# Patient Record
Sex: Male | Born: 1945 | Race: White | Hispanic: No | Marital: Married | Smoking: Never smoker
Health system: Southern US, Community
[De-identification: ages and names within clinical notes are randomized; demographics above are authoritative.]

## PROBLEM LIST (undated history)

## (undated) DIAGNOSIS — D126 Benign neoplasm of colon, unspecified: Secondary | ICD-10-CM

## (undated) DIAGNOSIS — M1990 Unspecified osteoarthritis, unspecified site: Secondary | ICD-10-CM

## (undated) DIAGNOSIS — I351 Nonrheumatic aortic (valve) insufficiency: Secondary | ICD-10-CM

## (undated) DIAGNOSIS — Z792 Long term (current) use of antibiotics: Secondary | ICD-10-CM

## (undated) DIAGNOSIS — G4730 Sleep apnea, unspecified: Secondary | ICD-10-CM

## (undated) DIAGNOSIS — R911 Solitary pulmonary nodule: Secondary | ICD-10-CM

## (undated) DIAGNOSIS — J309 Allergic rhinitis, unspecified: Secondary | ICD-10-CM

## (undated) DIAGNOSIS — Z87442 Personal history of urinary calculi: Secondary | ICD-10-CM

## (undated) DIAGNOSIS — I7789 Other specified disorders of arteries and arterioles: Secondary | ICD-10-CM

## (undated) DIAGNOSIS — E785 Hyperlipidemia, unspecified: Secondary | ICD-10-CM

## (undated) DIAGNOSIS — K8020 Calculus of gallbladder without cholecystitis without obstruction: Secondary | ICD-10-CM

## (undated) DIAGNOSIS — I350 Nonrheumatic aortic (valve) stenosis: Secondary | ICD-10-CM

## (undated) DIAGNOSIS — E291 Testicular hypofunction: Secondary | ICD-10-CM

## (undated) DIAGNOSIS — J45909 Unspecified asthma, uncomplicated: Secondary | ICD-10-CM

## (undated) DIAGNOSIS — I10 Essential (primary) hypertension: Secondary | ICD-10-CM

## (undated) DIAGNOSIS — G4733 Obstructive sleep apnea (adult) (pediatric): Secondary | ICD-10-CM

## (undated) DIAGNOSIS — H409 Unspecified glaucoma: Secondary | ICD-10-CM

## (undated) DIAGNOSIS — I4891 Unspecified atrial fibrillation: Secondary | ICD-10-CM

## (undated) HISTORY — DX: Testicular hypofunction: E29.1

## (undated) HISTORY — PX: CARDIAC CATHETERIZATION: SHX172

## (undated) HISTORY — DX: Hyperlipidemia, unspecified: E78.5

## (undated) HISTORY — DX: Other specified disorders of arteries and arterioles: I77.89

## (undated) HISTORY — DX: Unspecified glaucoma: H40.9

## (undated) HISTORY — DX: Unspecified atrial fibrillation: I48.91

## (undated) HISTORY — DX: Obstructive sleep apnea (adult) (pediatric): G47.33

## (undated) HISTORY — DX: Benign neoplasm of colon, unspecified: D12.6

## (undated) HISTORY — PX: LITHOTRIPSY: SUR834

## (undated) HISTORY — DX: Personal history of urinary calculi: Z87.442

## (undated) HISTORY — DX: Long term (current) use of antibiotics: Z79.2

## (undated) HISTORY — DX: Nonrheumatic aortic (valve) insufficiency: I35.1

## (undated) HISTORY — PX: JOINT REPLACEMENT: SHX530

## (undated) HISTORY — PX: CORONARY ARTERY BYPASS GRAFT: SHX141

## (undated) HISTORY — PX: EYE SURGERY: SHX253

## (undated) HISTORY — DX: Allergic rhinitis, unspecified: J30.9

## (undated) HISTORY — DX: Calculus of gallbladder without cholecystitis without obstruction: K80.20

## (undated) HISTORY — PX: RHINOPLASTY: SUR1284

## (undated) HISTORY — DX: Nonrheumatic aortic (valve) stenosis: I35.0

## (undated) HISTORY — DX: Sleep apnea, unspecified: G47.30

## (undated) HISTORY — PX: CATARACT EXTRACTION: SUR2

## (undated) HISTORY — PX: CARDIAC VALVE REPLACEMENT: SHX585

## (undated) HISTORY — PX: HERNIA REPAIR: SHX51

## (undated) HISTORY — DX: Solitary pulmonary nodule: R91.1

---

## 2002-05-21 ENCOUNTER — Encounter: Payer: Self-pay | Admitting: Emergency Medicine

## 2002-05-21 ENCOUNTER — Emergency Department (HOSPITAL_COMMUNITY): Admission: EM | Admit: 2002-05-21 | Discharge: 2002-05-21 | Payer: Self-pay | Admitting: Emergency Medicine

## 2004-11-10 ENCOUNTER — Ambulatory Visit: Payer: Self-pay | Admitting: Pulmonary Disease

## 2004-12-03 ENCOUNTER — Ambulatory Visit: Payer: Self-pay | Admitting: Pulmonary Disease

## 2004-12-31 ENCOUNTER — Ambulatory Visit: Payer: Self-pay | Admitting: Pulmonary Disease

## 2005-06-01 ENCOUNTER — Ambulatory Visit: Payer: Self-pay | Admitting: Pulmonary Disease

## 2005-10-20 ENCOUNTER — Ambulatory Visit: Payer: Self-pay | Admitting: Pulmonary Disease

## 2005-11-23 ENCOUNTER — Ambulatory Visit: Payer: Self-pay | Admitting: Pulmonary Disease

## 2005-12-13 ENCOUNTER — Ambulatory Visit: Payer: Self-pay | Admitting: Pulmonary Disease

## 2006-04-15 ENCOUNTER — Ambulatory Visit: Payer: Self-pay | Admitting: Pulmonary Disease

## 2006-12-05 ENCOUNTER — Ambulatory Visit: Payer: Self-pay | Admitting: Pulmonary Disease

## 2006-12-05 LAB — CONVERTED CEMR LAB
ALT: 31 units/L (ref 0–40)
AST: 29 units/L (ref 0–37)
Albumin: 3.6 g/dL (ref 3.5–5.2)
Alkaline Phosphatase: 54 units/L (ref 39–117)
BUN: 16 mg/dL (ref 6–23)
Basophils Absolute: 0 10*3/uL (ref 0.0–0.1)
Basophils Relative: 0.5 % (ref 0.0–1.0)
Bilirubin Urine: NEGATIVE
Bilirubin, Direct: 0.1 mg/dL (ref 0.0–0.3)
CO2: 33 meq/L — ABNORMAL HIGH (ref 19–32)
Calcium: 9.2 mg/dL (ref 8.4–10.5)
Chloride: 106 meq/L (ref 96–112)
Cholesterol: 128 mg/dL (ref 0–200)
Creatinine, Ser: 1.2 mg/dL (ref 0.4–1.5)
Eosinophils Absolute: 0.2 10*3/uL (ref 0.0–0.6)
Eosinophils Relative: 3.5 % (ref 0.0–5.0)
GFR calc Af Amer: 79 mL/min
GFR calc non Af Amer: 65 mL/min
Glucose, Bld: 103 mg/dL — ABNORMAL HIGH (ref 70–99)
HCT: 46.1 % (ref 39.0–52.0)
HDL: 31.2 mg/dL — ABNORMAL LOW (ref >39.0)
Hemoglobin, Urine: NEGATIVE
Hemoglobin: 15.6 g/dL (ref 13.0–17.0)
Ketones, ur: NEGATIVE mg/dL
LDL Cholesterol: 67 mg/dL (ref 0–99)
Leukocytes, UA: NEGATIVE
Lymphocytes Relative: 27.1 % (ref 12.0–46.0)
MCHC: 33.9 g/dL (ref 30.0–36.0)
MCV: 95.9 fL (ref 78.0–100.0)
Monocytes Absolute: 0.6 10*3/uL (ref 0.2–0.7)
Monocytes Relative: 9.9 % (ref 3.0–11.0)
Neutro Abs: 3.6 10*3/uL (ref 1.4–7.7)
Neutrophils Relative %: 59 % (ref 43.0–77.0)
Nitrite: NEGATIVE
PSA: 0.85 ng/mL (ref 0.10–4.00)
Platelets: 177 10*3/uL (ref 150–400)
Potassium: 4.6 meq/L (ref 3.5–5.1)
RBC: 4.8 M/uL (ref 4.22–5.81)
RDW: 12.3 % (ref 11.5–14.6)
Sodium: 144 meq/L (ref 135–145)
Specific Gravity, Urine: 1.025 (ref 1.000–1.03)
TSH: 1.41 microintl units/mL (ref 0.35–5.50)
Total Bilirubin: 0.8 mg/dL (ref 0.3–1.2)
Total CHOL/HDL Ratio: 4.1
Total Protein, Urine: NEGATIVE mg/dL
Total Protein: 6.6 g/dL (ref 6.0–8.3)
Triglycerides: 147 mg/dL (ref 0–149)
Urine Glucose: NEGATIVE mg/dL
Urobilinogen, UA: 0.2 (ref 0.0–1.0)
VLDL: 29 mg/dL (ref 0–40)
WBC: 6.1 10*3/uL (ref 4.5–10.5)
pH: 6 (ref 5.0–8.0)

## 2006-12-21 ENCOUNTER — Ambulatory Visit: Payer: Self-pay

## 2007-01-04 ENCOUNTER — Ambulatory Visit: Payer: Self-pay | Admitting: Pulmonary Disease

## 2007-01-04 LAB — CONVERTED CEMR LAB
Fecal Occult Blood: NEGATIVE
OCCULT 1: NEGATIVE
OCCULT 2: NEGATIVE
OCCULT 3: NEGATIVE
OCCULT 4: NEGATIVE
OCCULT 5: NEGATIVE

## 2007-12-18 DIAGNOSIS — G4733 Obstructive sleep apnea (adult) (pediatric): Secondary | ICD-10-CM

## 2007-12-18 DIAGNOSIS — J309 Allergic rhinitis, unspecified: Secondary | ICD-10-CM | POA: Insufficient documentation

## 2007-12-18 DIAGNOSIS — J209 Acute bronchitis, unspecified: Secondary | ICD-10-CM | POA: Insufficient documentation

## 2007-12-18 DIAGNOSIS — E7800 Pure hypercholesterolemia, unspecified: Secondary | ICD-10-CM | POA: Insufficient documentation

## 2008-05-08 ENCOUNTER — Ambulatory Visit: Payer: Self-pay | Admitting: Internal Medicine

## 2008-08-21 ENCOUNTER — Ambulatory Visit: Payer: Self-pay | Admitting: Pulmonary Disease

## 2008-08-22 DIAGNOSIS — H409 Unspecified glaucoma: Secondary | ICD-10-CM

## 2008-08-22 LAB — CONVERTED CEMR LAB
ALT: 44 units/L (ref 0–53)
AST: 40 units/L — ABNORMAL HIGH (ref 0–37)
Albumin: 4 g/dL (ref 3.5–5.2)
Alkaline Phosphatase: 58 units/L (ref 39–117)
BUN: 16 mg/dL (ref 6–23)
Basophils Absolute: 0.4 10*3/uL — ABNORMAL HIGH (ref 0.0–0.1)
Basophils Relative: 5 % — ABNORMAL HIGH (ref 0.0–3.0)
Bilirubin, Direct: 0.2 mg/dL (ref 0.0–0.3)
CO2: 30 meq/L (ref 19–32)
Calcium: 9.5 mg/dL (ref 8.4–10.5)
Chloride: 106 meq/L (ref 96–112)
Cholesterol: 124 mg/dL (ref 0–200)
Creatinine, Ser: 1.2 mg/dL (ref 0.4–1.5)
Eosinophils Absolute: 0.3 10*3/uL (ref 0.0–0.7)
Eosinophils Relative: 3.6 % (ref 0.0–5.0)
GFR calc Af Amer: 79 mL/min
GFR calc non Af Amer: 65 mL/min
Glucose, Bld: 100 mg/dL — ABNORMAL HIGH (ref 70–99)
HCT: 47.1 % (ref 39.0–52.0)
HDL: 32.8 mg/dL — ABNORMAL LOW (ref >39.0)
Hemoglobin: 16.2 g/dL (ref 13.0–17.0)
LDL Cholesterol: 67 mg/dL (ref 0–99)
Lymphocytes Relative: 30.3 % (ref 12.0–46.0)
MCHC: 34.4 g/dL (ref 30.0–36.0)
MCV: 94.7 fL (ref 78.0–100.0)
Monocytes Absolute: 0.6 10*3/uL (ref 0.1–1.0)
Monocytes Relative: 7.7 % (ref 3.0–12.0)
Neutro Abs: 4 10*3/uL (ref 1.4–7.7)
Neutrophils Relative %: 53.4 % (ref 43.0–77.0)
PSA: 1.22 ng/mL (ref 0.10–4.00)
Platelets: 165 10*3/uL (ref 150–400)
Potassium: 4.5 meq/L (ref 3.5–5.1)
RBC: 4.97 M/uL (ref 4.22–5.81)
RDW: 12.6 % (ref 11.5–14.6)
Sodium: 142 meq/L (ref 135–145)
TSH: 1.52 microintl units/mL (ref 0.35–5.50)
Total Bilirubin: 0.9 mg/dL (ref 0.3–1.2)
Total CHOL/HDL Ratio: 3.8
Total Protein: 7.1 g/dL (ref 6.0–8.3)
Triglycerides: 122 mg/dL (ref 0–149)
VLDL: 24 mg/dL (ref 0–40)
WBC: 7.6 10*3/uL (ref 4.5–10.5)

## 2008-08-25 LAB — CONVERTED CEMR LAB: Vit D, 1,25-Dihydroxy: 30 (ref 30–89)

## 2008-11-13 ENCOUNTER — Telehealth (INDEPENDENT_AMBULATORY_CARE_PROVIDER_SITE_OTHER): Payer: Self-pay | Admitting: *Deleted

## 2008-11-19 ENCOUNTER — Encounter: Payer: Self-pay | Admitting: Pulmonary Disease

## 2008-11-19 ENCOUNTER — Ambulatory Visit (HOSPITAL_BASED_OUTPATIENT_CLINIC_OR_DEPARTMENT_OTHER): Admission: RE | Admit: 2008-11-19 | Discharge: 2008-11-19 | Payer: Self-pay | Admitting: Pulmonary Disease

## 2008-11-30 ENCOUNTER — Ambulatory Visit: Payer: Self-pay | Admitting: Pulmonary Disease

## 2008-12-04 ENCOUNTER — Ambulatory Visit: Payer: Self-pay | Admitting: Pulmonary Disease

## 2009-02-10 ENCOUNTER — Ambulatory Visit: Payer: Self-pay | Admitting: Pulmonary Disease

## 2009-03-24 ENCOUNTER — Telehealth: Payer: Self-pay | Admitting: Pulmonary Disease

## 2009-03-25 ENCOUNTER — Encounter: Payer: Self-pay | Admitting: Pulmonary Disease

## 2009-04-03 ENCOUNTER — Ambulatory Visit (HOSPITAL_COMMUNITY): Admission: RE | Admit: 2009-04-03 | Discharge: 2009-04-03 | Payer: Self-pay | Admitting: Urology

## 2009-04-15 ENCOUNTER — Encounter: Payer: Self-pay | Admitting: Pulmonary Disease

## 2009-05-08 ENCOUNTER — Telehealth: Payer: Self-pay | Admitting: Pulmonary Disease

## 2009-05-09 ENCOUNTER — Encounter: Payer: Self-pay | Admitting: Pulmonary Disease

## 2009-05-16 ENCOUNTER — Encounter: Payer: Self-pay | Admitting: Pulmonary Disease

## 2009-06-27 ENCOUNTER — Encounter: Payer: Self-pay | Admitting: Pulmonary Disease

## 2009-07-09 ENCOUNTER — Telehealth (INDEPENDENT_AMBULATORY_CARE_PROVIDER_SITE_OTHER): Payer: Self-pay | Admitting: *Deleted

## 2009-08-13 ENCOUNTER — Ambulatory Visit: Payer: Self-pay | Admitting: Pulmonary Disease

## 2009-08-22 ENCOUNTER — Encounter (INDEPENDENT_AMBULATORY_CARE_PROVIDER_SITE_OTHER): Payer: Self-pay | Admitting: *Deleted

## 2009-08-22 ENCOUNTER — Ambulatory Visit: Payer: Self-pay | Admitting: Pulmonary Disease

## 2009-08-22 DIAGNOSIS — H9319 Tinnitus, unspecified ear: Secondary | ICD-10-CM | POA: Insufficient documentation

## 2009-08-23 DIAGNOSIS — N200 Calculus of kidney: Secondary | ICD-10-CM | POA: Insufficient documentation

## 2009-08-23 LAB — CONVERTED CEMR LAB
ALT: 28 units/L (ref 0–53)
AST: 29 units/L (ref 0–37)
Albumin: 4.2 g/dL (ref 3.5–5.2)
Alkaline Phosphatase: 54 units/L (ref 39–117)
BUN: 16 mg/dL (ref 6–23)
Basophils Absolute: 0 10*3/uL (ref 0.0–0.1)
Basophils Relative: 0.6 % (ref 0.0–3.0)
Bilirubin, Direct: 0.1 mg/dL (ref 0.0–0.3)
CO2: 30 meq/L (ref 19–32)
Calcium: 9.4 mg/dL (ref 8.4–10.5)
Chloride: 106 meq/L (ref 96–112)
Cholesterol: 120 mg/dL (ref 0–200)
Creatinine, Ser: 1.2 mg/dL (ref 0.4–1.5)
Eosinophils Absolute: 0.2 10*3/uL (ref 0.0–0.7)
Eosinophils Relative: 3.3 % (ref 0.0–5.0)
GFR calc non Af Amer: 64.82 mL/min (ref >60)
Glucose, Bld: 101 mg/dL — ABNORMAL HIGH (ref 70–99)
HCT: 45.6 % (ref 39.0–52.0)
HDL: 30.6 mg/dL — ABNORMAL LOW (ref >39.00)
Hemoglobin: 15.6 g/dL (ref 13.0–17.0)
LDL Cholesterol: 59 mg/dL (ref 0–99)
Lymphocytes Relative: 32.7 % (ref 12.0–46.0)
Lymphs Abs: 1.8 10*3/uL (ref 0.7–4.0)
MCHC: 34.1 g/dL (ref 30.0–36.0)
MCV: 98.3 fL (ref 78.0–100.0)
Monocytes Absolute: 0.6 10*3/uL (ref 0.1–1.0)
Monocytes Relative: 10 % (ref 3.0–12.0)
Neutro Abs: 3 10*3/uL (ref 1.4–7.7)
Neutrophils Relative %: 53.4 % (ref 43.0–77.0)
PSA: 0.89 ng/mL (ref 0.10–4.00)
Platelets: 157 10*3/uL (ref 150.0–400.0)
Potassium: 4.3 meq/L (ref 3.5–5.1)
RBC: 4.64 M/uL (ref 4.22–5.81)
RDW: 12.5 % (ref 11.5–14.6)
Sodium: 143 meq/L (ref 135–145)
TSH: 1.31 microintl units/mL (ref 0.35–5.50)
Total Bilirubin: 0.8 mg/dL (ref 0.3–1.2)
Total CHOL/HDL Ratio: 4
Total Protein: 7.3 g/dL (ref 6.0–8.3)
Triglycerides: 150 mg/dL — ABNORMAL HIGH (ref 0.0–149.0)
VLDL: 30 mg/dL (ref 0.0–40.0)
WBC: 5.6 10*3/uL (ref 4.5–10.5)

## 2009-08-28 ENCOUNTER — Encounter (INDEPENDENT_AMBULATORY_CARE_PROVIDER_SITE_OTHER): Payer: Self-pay | Admitting: *Deleted

## 2009-08-28 ENCOUNTER — Encounter: Payer: Self-pay | Admitting: Pulmonary Disease

## 2009-09-01 ENCOUNTER — Ambulatory Visit: Payer: Self-pay | Admitting: Gastroenterology

## 2009-09-17 ENCOUNTER — Ambulatory Visit: Payer: Self-pay | Admitting: Gastroenterology

## 2009-09-23 ENCOUNTER — Encounter: Payer: Self-pay | Admitting: Gastroenterology

## 2009-12-31 ENCOUNTER — Telehealth (INDEPENDENT_AMBULATORY_CARE_PROVIDER_SITE_OTHER): Payer: Self-pay | Admitting: *Deleted

## 2010-02-19 ENCOUNTER — Encounter: Payer: Self-pay | Admitting: Adult Health

## 2010-02-19 ENCOUNTER — Ambulatory Visit: Payer: Self-pay | Admitting: Pulmonary Disease

## 2010-02-20 LAB — CONVERTED CEMR LAB
Basophils Absolute: 0 10*3/uL (ref 0.0–0.1)
Basophils Relative: 0 % (ref 0–1)
Calcium: 9.8 mg/dL (ref 8.4–10.5)
Lymphocytes Relative: 39 % (ref 12–46)
MCHC: 33.1 g/dL (ref 30.0–36.0)
Neutro Abs: 3.1 10*3/uL (ref 1.7–7.7)
Neutrophils Relative %: 46 % (ref 43–77)
RDW: 13.2 % (ref 11.5–15.5)
Sodium: 140 meq/L (ref 135–145)
TSH: 1.735 microintl units/mL (ref 0.350–4.500)

## 2010-03-24 ENCOUNTER — Ambulatory Visit: Payer: Self-pay | Admitting: Pulmonary Disease

## 2010-08-13 ENCOUNTER — Encounter: Payer: Self-pay | Admitting: Pulmonary Disease

## 2010-08-24 ENCOUNTER — Ambulatory Visit: Payer: Self-pay | Admitting: Pulmonary Disease

## 2010-09-07 ENCOUNTER — Ambulatory Visit: Payer: Self-pay | Admitting: Pulmonary Disease

## 2010-09-07 DIAGNOSIS — R5381 Other malaise: Secondary | ICD-10-CM

## 2010-09-07 DIAGNOSIS — R5383 Other fatigue: Secondary | ICD-10-CM

## 2010-09-08 DIAGNOSIS — E291 Testicular hypofunction: Secondary | ICD-10-CM

## 2010-09-10 LAB — CONVERTED CEMR LAB: Testosterone: 139.66 ng/dL — ABNORMAL LOW (ref 350.00–890.00)

## 2010-09-14 ENCOUNTER — Telehealth (INDEPENDENT_AMBULATORY_CARE_PROVIDER_SITE_OTHER): Payer: Self-pay | Admitting: *Deleted

## 2010-09-15 ENCOUNTER — Encounter: Payer: Self-pay | Admitting: Pulmonary Disease

## 2010-09-29 ENCOUNTER — Telehealth (INDEPENDENT_AMBULATORY_CARE_PROVIDER_SITE_OTHER): Payer: Self-pay | Admitting: *Deleted

## 2010-10-16 ENCOUNTER — Ambulatory Visit
Admission: RE | Admit: 2010-10-16 | Discharge: 2010-10-16 | Payer: Self-pay | Source: Home / Self Care | Attending: Pulmonary Disease | Admitting: Pulmonary Disease

## 2010-10-27 NOTE — Letter (Signed)
Summary: Denied [facility]  Denied [facility]   Imported By: Lester [place] 08/26/2010 07:52:58  _____________________________________________________________________  External Attachment:    Type:   Image     Comment:   External Document

## 2010-10-27 NOTE — Progress Notes (Signed)
Summary: Records request from [employer] for records received from [employer]. Request forwarded to [facility]. Dena Chavis  December 31, 2009 4:34 PM

## 2010-10-27 NOTE — Assessment & Plan Note (Signed)
Summary: Acute NP office - HTN   Copy to:  [name] Primary Provider/Referring Provider:  [name]  CC:  elevated BP onset yesterday 154/100 and 160/102 at exercise program with his employer - denies HA and changes in vision.  History of Present Illness: 65 y/o WF here for a follow up visit & CPX...    ~  Oct09:  he feels well and has had a good year without new complaints or concerns... he still participates in several softball leagues including one for 20 year-olds and one for 60 year-olds [employer])... in 2005 he tripped and was knocked out- eval [place] [facility] w/ neg CT Brain- post concussion headache & referred to Neuro...   ~  August 22, 2009:  he developed a kidney stone 6/10 & has been eval by [name]- s/p lithotripsy and fragment still present, last seen 10/10 on Rapaflo, may need ureteroscopy (but no pain or hematuria now)...  today c/o tinnitus & wife wants further eval so we will refer him to ENT per request... 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   Feb 19, 2010 --Presents for work in visit for elevated blood pressure. Elevated BP onset yesterday 154/100 and 160/102 at exercise program with his employer. Denies chest pain, dyspnea, orthopnea, hemoptysis, fever, n/v/d, edema, headache,recent travel or antibiotics, speech/visual changes.  Not taking any new meds or otc meds. Recently started new exercise regimen. In past has had well controlled b/p on average 110-120 systolic.   Medications Prior to Update: 1)  Lumigan 0.03 % Soln (Bimatoprost) .[name].. 1 Drop in Each Eye Daily 2)  Proair Hfa 108 (90 Base) Mcg/act Aers (Albuterol Sulfate) .... Inhale 1-2 Sprays Every 4-6 H As Needed For Wheezing... 3)  Singulair 10 Mg  Tabs (Montelukast Sodium) .... Take 1 Tablet By Mouth Once A Day As Directed... 4)  Adult Aspirin Low Strength 81 Mg  Tbdp (Aspirin) .... Take 1 Tablet By Mouth Once A Day 5)   Simvastatin 20 Mg Tabs (Simvastatin) .[name].. 1 By Mouth At Bedtime 6)  Fish Oil 1000 Mg  Caps (Omega-3 Fatty Acids) .... Take 1 Tablet By Mouth Once A Day 7)  Multivitamins   Tabs (Multiple Vitamin) .... Take 1 Tablet By Mouth Once A Day 8)  Vitamin C 500 Mg Tabs (Ascorbic Acid) .[name].. 1 By Mouth Two Times A Day 9)  Melatonin 3 Mg Tabs (Melatonin) .... 2 By Mouth At Bedtime 10)  Vitamin D 1000 Unit Caps (Cholecalciferol) .... Take 1 Tablet By Mouth Once A Day 11)  Rapaflo 8 Mg Caps (Silodosin) .... Take As Directed By Drdahlstadt... 12)  Levitra 20 Mg Tabs (Vardenafil Hcl) .... Take As Directed...  Current Medications (verified): 1)  Lumigan 0.03 % Soln (Bimatoprost) .[name].. 1 Drop in Each Eye Daily 2)  Proair Hfa 108 (90 Base) Mcg/act Aers (Albuterol Sulfate) .... Inhale 1-2 Sprays Every 4-6 H As Needed For Wheezing... 3)  Singulair 10 Mg  Tabs (Montelukast Sodium) .... Take 1 Tablet By Mouth Once A Day As Directed... 4)  Adult Aspirin Low Strength 81 Mg  Tbdp (Aspirin) .... Take 1 Tablet By Mouth Once A Day 5)  Simvastatin 20 Mg Tabs (Simvastatin) .[name].. 1 By Mouth At Bedtime 6)  Fish Oil 1000 Mg  Caps (Omega-3 Fatty Acids) .... Take 1 Capsule By Mouth Two Times A Day 7)  Multivitamins   Tabs (Multiple Vitamin) .... Take 1 Tablet By Mouth  Once A Day 8)  Vitamin C 500 Mg Tabs (Ascorbic Acid) .[name].. 1 By Mouth Two Times A Day 9)  Melatonin 3 Mg Tabs (Melatonin) .... 2 By Mouth At Bedtime 10)  Vitamin D 1000 Unit Caps (Cholecalciferol) .... Take 1 Tablet By Mouth Once A Day 11)  Levitra 20 Mg Tabs (Vardenafil Hcl) .... Take As Directed...  Allergies (verified): No Known Drug Allergies  Past History:  Past Medical History: Last updated: 08/22/2009 GLAUCOMA (ICD-365.9) ALLERGIC RHINITIS (ICD-477.9) OBSTRUCTIVE SLEEP APNEA (ICD-327.23) ASTHMATIC BRONCHITIS, ACUTE (ICD-466.0) Family Hx of CARDIAC DISEASE (ICD-429.9) HYPERCHOLESTEROLEMIA (ICD-272.0) COLONIC POLYPS (ICD-211.3) NEPHROLITHIASIS  (ICD-592.0)  Past Surgical History: Last updated: 08/22/2009 S/P rhinoplasty in the 1980s  Family History: Last updated: Dec 08, 2008 Father died age 13 w/ ? MI Mother alive in her 80s w/ pacemaker 3 Siblings- all brothers: 1 Bro w/ carotid dis & smokes 1 Bro w/ CABG 1 Bro w/ stents allergies: son  Social History: Last updated: December 08, 2008 never smoked exercises 5-6 times a week no caffeine married and lives with spouse.  2 children works as a Production designer, theatre/television/film.  Risk Factors: Smoking Status: never (2008-12-08)  Review of Systems      See HPI  Vital Signs:  Patient profile:   65 year old male Height:      72 inches Weight:      206 pounds BMI:     28.04 O2 Sat:      99 % on Room air Temp:     98.1 degrees F oral Pulse rate:   66 / minute BP sitting:   134 / 80  (left arm) Cuff size:   regular  Vitals Entered By: [name] (Feb 19, 2010 3:34 PM)  O2 Flow:  Room air CC: elevated BP onset yesterday 154/100 and 160/102 at exercise program with his employer - denies HA, changes in vision Is Patient Diabetic? No Comments Medications reviewed with patient Daytime contact number verified with patient. [name]  Feb 19, 2010 3:34 PM    Physical Exam  Additional Exam:  GEN: A/Ox3; pleasant , NAD HEENT:  /AT, , EACs-clear, TMs-wnl, NOSE-clear, THROAT-clear NECK:  Supple w/ fair ROM; no JVD; normal carotid impulses w/o bruits; no thyromegaly or nodules palpated; no lymphadenopathy. RESP  Clear to P & A; w/o, wheezes/ rales/ or rhonchi. CARD:  RRR, no m/r/g  , no bruits  GI:   Soft & nt; nml bowel sounds; no organomegaly or masses detected. [place]: Warm bil,  no calf tenderness edema, clubbing, pulses intact Neuro: intact w/ no focal deficits    Impression & Recommendations:  Problem # 1:  ELEVATED BLOOD PRESSURE (ICD-796.2)  Episodic elevated b/p , tr on higher side previously has had no episodes of HTN- labs reviewed from 11/10  unable to  identify underlying cause and no associated symptoms will monitor over next month, if remains elevated will add med on return in 4 weeks. labs pending  Orders: TLB-BMP (Basic Metabolic Panel-BMET) (80048-METABOL) TLB-CBC Platelet - w/Differential (85025-CBCD) TLB-TSH (Thyroid Stimulating Hormone) (84443-TSH) Est. Patient Level IV (16109)  Medications Added to Medication List This Visit: 1)  Fish Oil 1000 Mg Caps (Omega-3 fatty acids) .... Take 1 capsule by mouth two times a day  Complete Medication List: 1)  Lumigan 0.03 % Soln (Bimatoprost) .[name].. 1 drop in each eye daily 2)  Proair Hfa 108 (90 Base) Mcg/act Aers (Albuterol sulfate) .... Inhale 1-2 sprays every 4-6 h as needed for wheezing... 3)  Singulair 10 Mg Tabs (Montelukast sodium) .Marland KitchenMarland KitchenMarland Kitchen  Take 1 tablet by mouth once a day as directed... 4)  Adult Aspirin Low Strength 81 Mg Tbdp (Aspirin) .... Take 1 tablet by mouth once a day 5)  Simvastatin 20 Mg Tabs (Simvastatin) .[name].. 1 by mouth at bedtime 6)  Fish Oil 1000 Mg Caps (Omega-3 fatty acids) .... Take 1 capsule by mouth two times a day 7)  Multivitamins Tabs (Multiple vitamin) .... Take 1 tablet by mouth once a day 8)  Vitamin C 500 Mg Tabs (Ascorbic acid) .[name].. 1 by mouth two times a day 9)  Melatonin 3 Mg Tabs (Melatonin) .... 2 by mouth at bedtime 10)  Vitamin D 1000 Unit Caps (Cholecalciferol) .... Take 1 tablet by mouth once a day 11)  Levitra 20 Mg Tabs (Vardenafil hcl) .... Take as directed...  Patient Instructions: 1)  CHeck blood pressure 3-4 x week in am at rest, keep in log and bring to next visit.  2)  Low salt diet, avoid decongestants (sudafed) and NSAIDS (ibuprofen/advil) 3)  Call if blood pressure >160.  4)  Please contact office for sooner follow up if symptoms do not improve or worsen

## 2010-10-27 NOTE — Assessment & Plan Note (Signed)
Summary: NP follow up - HTN   Copy to:  [name] Primary Provider/Referring Provider:  [name]  CC:  1 month HTN - pt brought BP log with him today.  pt states he has been doing well and no complaints..  History of Present Illness: 65 y/o WF here for a follow up visit & CPX...    ~  Oct09:  he feels well and has had a good year without new complaints or concerns... he still participates in several softball leagues including one for 20 year-olds and one for 60 year-olds [employer])... in 2005 he tripped and was knocked out- eval [facility] w/ neg CT Brain- post concussion headache & referred to Neuro...   ~  August 22, 2009:  he developed a kidney stone 6/10 & has been eval by [name]- s/p lithotripsy and fragment still present, last seen 10/10 on Rapaflo, may need ureteroscopy (but no pain or hematuria now)...  today c/o tinnitus & wife wants further eval so we will refer him to ENT per request... 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   Feb 19, 2010 --Presents for work in visit for elevated blood pressure. Elevated BP onset yesterday 154/100 and 160/102 at exercise program with his employer. Denies chest pain, dyspnea, orthopnea, hemoptysis, fever, n/v/d, edema, headache,recent travel or antibiotics, speech/visual changes.  Not taking any new meds or otc meds. Recently started  new exercise regimen. In past has had well controlled b/p on average 110-120 systolic.   March 24, 2010--Returns for 1 month HTN - pt brought BP log with him today.  pt states he has been doing well, no complaints. He continues to be very active. Last visit. labs were good w/ cbc, bmet, tsh. b/p at home averaging 120-135 /85-95. today b/p looks great at 116/84. Denies chest pain, dyspnea, orthopnea, hemoptysis, fever, n/v/d, edema, headache,recent travel or antibiotics  Medications Prior to Update: 1)  Lumigan 0.03 % Soln  (Bimatoprost) .[name].. 1 Drop in Each Eye Daily 2)  Proair Hfa 108 (90 Base) Mcg/act Aers (Albuterol Sulfate) .... Inhale 1-2 Sprays Every 4-6 H As Needed For Wheezing... 3)  Singulair 10 Mg  Tabs (Montelukast Sodium) .... Take 1 Tablet By Mouth Once A Day As Directed... 4)  Adult Aspirin Low Strength 81 Mg  Tbdp (Aspirin) .... Take 1 Tablet By Mouth Once A Day 5)  Simvastatin 20 Mg Tabs (Simvastatin) .[name].. 1 By Mouth At Bedtime 6)  Fish Oil 1000 Mg  Caps (Omega-3 Fatty Acids) .... Take 1 Capsule By Mouth Two Times A Day 7)  Multivitamins   Tabs (Multiple Vitamin) .... Take 1 Tablet By Mouth Once A Day 8)  Vitamin C 500 Mg Tabs (Ascorbic Acid) .[name].. 1 By Mouth Two Times A Day 9)  Melatonin 3 Mg Tabs (Melatonin) .... 2 By Mouth At Bedtime 10)  Vitamin D 1000 Unit Caps (Cholecalciferol) .... Take 1 Tablet By Mouth Once A Day 11)  Levitra 20 Mg Tabs (Vardenafil Hcl) .... Take As Directed...  Current Medications (verified): 1)  Lumigan 0.03 % Soln (Bimatoprost) .[name].. 1 Drop in Each Eye Daily 2)  Proair Hfa 108 (90 Base) Mcg/act Aers (Albuterol Sulfate) .... Inhale 1-2 Sprays Every 4-6 H As Needed For Wheezing... 3)  Singulair 10 Mg  Tabs (Montelukast Sodium) .... Take 1 Tablet By Mouth Once A Day As Directed... 4)  Adult Aspirin Low Strength 81 Mg  Tbdp (Aspirin) .Marland KitchenMarland KitchenMarland Kitchen  Take 1 Tablet By Mouth Once A Day 5)  Simvastatin 20 Mg Tabs (Simvastatin) .[name].. 1 By Mouth At Bedtime 6)  Fish Oil 1000 Mg  Caps (Omega-3 Fatty Acids) .... Take 1 Capsule By Mouth Two Times A Day 7)  Multivitamins   Tabs (Multiple Vitamin) .... Take 1 Tablet By Mouth Once A Day 8)  Vitamin C 500 Mg Tabs (Ascorbic Acid) .[name].. 1 By Mouth Two Times A Day 9)  Melatonin 3 Mg Tabs (Melatonin) .... 2 By Mouth At Bedtime 10)  Vitamin D 1000 Unit Caps (Cholecalciferol) .... Take 1 Tablet By Mouth Once A Day 11)  Levitra 20 Mg Tabs (Vardenafil Hcl) .... Take As Directed...  Allergies (verified): No Known Drug Allergies  Past History:  Past  Medical History: Last updated: 08/22/2009 GLAUCOMA (ICD-365.9) ALLERGIC RHINITIS (ICD-477.9) OBSTRUCTIVE SLEEP APNEA (ICD-327.23) ASTHMATIC BRONCHITIS, ACUTE (ICD-466.0) Family Hx of CARDIAC DISEASE (ICD-429.9) HYPERCHOLESTEROLEMIA (ICD-272.0) COLONIC POLYPS (ICD-211.3) NEPHROLITHIASIS (ICD-592.0)  Past Surgical History: Last updated: 08/22/2009 S/P rhinoplasty in the 1980s  Family History: Last updated: 12-27-08 Father died age 17 w/ ? MI Mother alive in her 62s w/ pacemaker 3 Siblings- all brothers: 1 Bro w/ carotid dis & smokes 1 Bro w/ CABG 1 Bro w/ stents allergies: son  Social History: Last updated: 12-27-08 never smoked exercises 5-6 times a week no caffeine married and lives with spouse.  2 children works as a Production designer, theatre/television/film.  Risk Factors: Smoking Status: never (12/27/2008)  Review of Systems      See HPI  Vital Signs:  Patient profile:   65 year old male Height:      72 inches Weight:      207.31 pounds BMI:     28.22 O2 Sat:      99 % on Room air Temp:     98.2 degrees F oral Pulse rate:   56 / minute BP sitting:   116 / 84  (left arm) Cuff size:   regular  Vitals Entered By: [name] (March 24, 2010 2:30 PM)  O2 Flow:  Room air CC: 1 month HTN - pt brought BP log with him today.  pt states he has been doing well, no complaints. Is Patient Diabetic? No Comments Medications reviewed with patient Daytime contact number verified with patient. [name]  March 24, 2010 2:30 PM    Physical Exam  Additional Exam:  GEN: A/Ox3; pleasant , NAD HEENT:  [place]/AT, , EACs-clear, TMs-wnl, NOSE-clear, THROAT-clear NECK:  Supple w/ fair ROM; no JVD; normal carotid impulses w/o bruits; no thyromegaly or nodules palpated; no lymphadenopathy. RESP  Clear to P & A; w/o, wheezes/ rales/ or rhonchi. CARD:  RRR, no m/r/g  , no bruits  GI:   Soft & nt; nml bowel sounds; no organomegaly or masses detected. [place]: Warm bil,  no calf tenderness  edema, clubbing, pulses intact Neuro: intact w/ no focal deficits    Impression & Recommendations:  Problem # 1:  ELEVATED BLOOD PRESSURE (ICD-796.2)  b/p log looks good w/ no significant b/p increases.  advised on diet and exercise.  he is very active w/ softball would add some regular routine fast pace walking daily   Orders: Est. Patient Level II (94174)  Complete Medication List: 1)  Lumigan 0.03 % Soln (Bimatoprost) .[name].. 1 drop in each eye daily 2)  Proair Hfa 108 (90 Base) Mcg/act Aers (Albuterol sulfate) .... Inhale 1-2 sprays every 4-6 h as needed for wheezing... 3)  Singulair 10 Mg Tabs (Montelukast  sodium) .... Take 1 tablet by mouth once a day as directed... 4)  Adult Aspirin Low Strength 81 Mg Tbdp (Aspirin) .... Take 1 tablet by mouth once a day 5)  Simvastatin 20 Mg Tabs (Simvastatin) .[name].. 1 by mouth at bedtime 6)  Fish Oil 1000 Mg Caps (Omega-3 fatty acids) .... Take 1 capsule by mouth two times a day 7)  Multivitamins Tabs (Multiple vitamin) .... Take 1 tablet by mouth once a day 8)  Vitamin C 500 Mg Tabs (Ascorbic acid) .[name].. 1 by mouth two times a day 9)  Melatonin 3 Mg Tabs (Melatonin) .... 2 by mouth at bedtime 10)  Vitamin D 1000 Unit Caps (Cholecalciferol) .... Take 1 tablet by mouth once a day 11)  Levitra 20 Mg Tabs (Vardenafil hcl) .... Take as directed...  Patient Instructions: 1)  CHeck blood pressure 1-2 x  week in am at rest, keep in log and bring to next visit.  2)  Low salt diet, avoid decongestants (sudafed) and NSAIDS (ibuprofen/advil) 3)  Call if blood pressure >160.  4)  TDAP (Tetanus booster at next office visit at physical. )  5)  follow up Dr. Kriste Basque In December for physical.  6)  Please contact office for sooner follow up if symptoms do not improve or worsen

## 2010-10-27 NOTE — Assessment & Plan Note (Signed)
Summary: Acute NP office visit - bronchitis   Copy to:  [name] Primary Provider/Referring Provider:  [name]  CC:  wheezing, DOE, and prod cough with tan mucus x2weeks - denies f/c/s.  difficulty sleeping x3weeks.  History of Present Illness: 65 y/o WF here for a follow up visit & CPX...    ~  Oct09:  he feels well and has had a good year without new complaints or concerns... he still participates in several softball leagues including one for 20 year-olds and one for 60 year-olds [employer])... in 2005 he tripped and was knocked out- eval [name] w/ neg CT Brain- post concussion headache & referred to Neuro...   ~  August 22, 2009:  he developed a kidney stone 6/10 & has been eval by [name]- s/p lithotripsy and fragment still present, last seen 10/10 on Rapaflo, may need ureteroscopy (but no pain or hematuria now)...  today c/o tinnitus & wife wants further eval so we will refer him to ENT per request... 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   Feb 19, 2010 --Presents for work in visit for elevated blood pressure. Elevated BP onset yesterday 154/100 and 160/102 at exercise program with his employer. Denies chest pain, dyspnea, orthopnea, hemoptysis, fever, n/v/d, edema, headache,recent travel or antibiotics, speech/visual changes.  Not taking any new meds or otc meds. Recently started  new exercise regimen. In past has had well controlled b/p on average 110-120 systolic.   March 24, 2010--Returns for 1 month HTN - pt brought BP log with him today.  pt states he has been doing well, no complaints. He continues to be very active. Last visit. labs were good w/ cbc, bmet, tsh. b/p at home averaging 120-135 /85-95. today b/p looks great at 116/84. Denies chest pain, dyspnea, orthopnea, hemoptysis, fever, n/v/d, edema, headache,recent travel or antibiotics August 24, 2010 - Presents for an acute  office visit. Complains of wheezing, DOE, prod cough with tan mucus x2weeks. Has been treating at home with otc meds with no improvement. Over last 3 days cough and wheezing are getting worse. Having trouble sleeping with cough and wheezing. Denies chest pain,  orthopnea, hemoptysis, fever, n/v/d, edema, headache. Increased use of rescue inhaler.   Medications Prior to Update: 1)  Lumigan 0.03 % Soln (Bimatoprost) .[name].. 1 Drop in Each Eye Daily 2)  Proair Hfa 108 (90 Base) Mcg/act Aers (Albuterol Sulfate) .... Inhale 1-2 Sprays Every 4-6 H As Needed For Wheezing... 3)  Singulair 10 Mg  Tabs (Montelukast Sodium) .... Take 1 Tablet By Mouth Once A Day As Directed... 4)  Adult Aspirin Low Strength 81 Mg  Tbdp (Aspirin) .... Take 1 Tablet By Mouth Once A Day 5)  Simvastatin 20 Mg Tabs (Simvastatin) .[name].. 1 By Mouth At Bedtime 6)  Fish Oil 1000 Mg  Caps (Omega-3 Fatty Acids) .... Take 1 Capsule By Mouth Two Times A Day 7)  Multivitamins   Tabs (Multiple Vitamin) .... Take 1 Tablet By Mouth Once A Day 8)  Vitamin C 500 Mg Tabs (Ascorbic Acid) .[name].. 1 By Mouth Two Times A Day 9)  Melatonin 3 Mg Tabs (Melatonin) .... 2 By Mouth At Bedtime 10)  Vitamin D 1000 Unit Caps (Cholecalciferol) .... Take 1 Tablet By Mouth Once A Day 11)  Levitra 20 Mg Tabs (Vardenafil Hcl) .... Take As Directed...  Current Medications (verified): 1)  Lumigan 0.03 % Soln (Bimatoprost) .Marland KitchenMarland KitchenMarland Kitchen  1 Drop in Each Eye Daily 2)  Proair Hfa 108 (90 Base) Mcg/act Aers (Albuterol Sulfate) .... Inhale 1-2 Sprays Every 4-6 H As Needed For Wheezing... 3)  Adult Aspirin Low Strength 81 Mg  Tbdp (Aspirin) .... Take 1 Tablet By Mouth Once A Day 4)  Simvastatin 20 Mg Tabs (Simvastatin) .[name].. 1 By Mouth At Bedtime 5)  Fish Oil 1000 Mg  Caps (Omega-3 Fatty Acids) .... Take 1 Capsule By Mouth Two Times A Day 6)  Multivitamins   Tabs (Multiple Vitamin) .... Take 1 Tablet By Mouth Once A Day 7)  Vitamin C 500 Mg Tabs (Ascorbic Acid) .[name].. 1 By Mouth Two Times  A Day 8)  Melatonin 3 Mg Tabs (Melatonin) .... 2 By Mouth At Bedtime 9)  Vitamin D 1000 Unit Caps (Cholecalciferol) .... Take 1 Tablet By Mouth Once A Day 10)  Levitra 20 Mg Tabs (Vardenafil Hcl) .... Take As Directed...  Allergies (verified): No Known Drug Allergies  Past History:  Past Medical History: Last updated: 08/22/2009 GLAUCOMA (ICD-365.9) ALLERGIC RHINITIS (ICD-477.9) OBSTRUCTIVE SLEEP APNEA (ICD-327.23) ASTHMATIC BRONCHITIS, ACUTE (ICD-466.0) Family Hx of CARDIAC DISEASE (ICD-429.9) HYPERCHOLESTEROLEMIA (ICD-272.0) COLONIC POLYPS (ICD-211.3) NEPHROLITHIASIS (ICD-592.0)  Past Surgical History: Last updated: 08/22/2009 S/P rhinoplasty in the 1980s  Family History: Last updated: 12/14/08 Father died age 74 w/ ? MI Mother alive in her 80s w/ pacemaker 3 Siblings- all brothers: 1 Bro w/ carotid dis & smokes 1 Bro w/ CABG 1 Bro w/ stents allergies: son  Social History: Last updated: 2008/12/14 never smoked exercises 5-6 times a week no caffeine married and lives with spouse.  2 children works as a Production designer, theatre/television/film.  Risk Factors: Smoking Status: never (2008-12-14)  Review of Systems      See HPI  Vital Signs:  Patient profile:   65 year old male Height:      72 inches Weight:      208 pounds BMI:     28.31 O2 Sat:      97 % on Room air Temp:     97.7 degrees F oral Pulse rate:   89 / minute BP sitting:   120 / 90  (left arm) Cuff size:   regular  Vitals Entered By: [name] (August 24, 2010 3:45 PM)  O2 Flow:  Room air CC: wheezing, DOE, prod cough with tan mucus x2weeks - denies f/c/s.  difficulty sleeping x3weeks Is Patient Diabetic? No Comments Medications reviewed with patient Daytime contact number verified with patient. [name]  August 24, 2010 3:45 PM    Physical Exam  Additional Exam:  GEN: A/Ox3; pleasant , NAD HEENT:  [place]/AT, , EACs-clear, TMs-wnl, NOSE-clear drainage , THROAT-clear NECK:  Supple w/ fair  ROM; no JVD; normal carotid impulses w/o bruits; no thyromegaly or nodules palpated; no lymphadenopathy. RESP  Coarse BS w/ exp wheezing  CARD:  RRR, no m/r/g  , no bruits  GI:   Soft & nt; nml bowel sounds; no organomegaly or masses detected. [place]co: Warm bil,  no calf tenderness edema, clubbing, pulses intact Neuro: intact w/ no focal deficits    Impression & Recommendations:  Problem # 1:  BRONCHITIS, ACUTE (ICD-466.0)  Asthmatic bronchitic flare  Plan:  xopenex neb given in office  Hold fish oil for 1 month.  Omnicef 300mg   two times a day for 7 days  Prednisone taper over next week.  Please contact office for sooner follow up if symptoms do not improve or worsen  follow up 2 weeks and  as needed  The following medications were removed from the medication list:    Singulair 10 Mg Tabs (Montelukast sodium) .[name]... Take 1 tablet by mouth once a day as directed... His updated medication list for this problem includes:    Proair Hfa 108 (90 Base) Mcg/act Aers (Albuterol sulfate) ..... Inhale 1-2 sprays every 4-6 h as needed for wheezing...    Cefdinir 300 Mg Caps (Cefdinir) .[name]... 1 by mouth two times a day  Orders: Est. Patient Level IV (29518)  Medications Added to Medication List This Visit: 1)  Cefdinir 300 Mg Caps (Cefdinir) .[name].. 1 by mouth two times a day 2)  Prednisone 10 Mg Tabs (Prednisone) .... 4 tabs for 2 days, then 3 tabs for 2 days, 2 tabs for 2 days, then 1 tab for 2 days, then stop  Complete Medication List: 1)  Lumigan 0.03 % Soln (Bimatoprost) .[name].. 1 drop in each eye daily 2)  Proair Hfa 108 (90 Base) Mcg/act Aers (Albuterol sulfate) .... Inhale 1-2 sprays every 4-6 h as needed for wheezing... 3)  Adult Aspirin Low Strength 81 Mg Tbdp (Aspirin) .... Take 1 tablet by mouth once a day 4)  Simvastatin 20 Mg Tabs (Simvastatin) .[name].. 1 by mouth at bedtime 5)  Fish Oil 1000 Mg Caps (Omega-3 fatty acids) .... Take 1 capsule by mouth two times a day 6)  Multivitamins Tabs  (Multiple vitamin) .... Take 1 tablet by mouth once a day 7)  Vitamin C 500 Mg Tabs (Ascorbic acid) .[name].. 1 by mouth two times a day 8)  Melatonin 3 Mg Tabs (Melatonin) .... 2 by mouth at bedtime 9)  Vitamin D 1000 Unit Caps (Cholecalciferol) .... Take 1 tablet by mouth once a day 10)  Levitra 20 Mg Tabs (Vardenafil hcl) .... Take as directed... 11)  Cefdinir 300 Mg Caps (Cefdinir) .[name].. 1 by mouth two times a day 12)  Prednisone 10 Mg Tabs (Prednisone) .... 4 tabs for 2 days, then 3 tabs for 2 days, 2 tabs for 2 days, then 1 tab for 2 days, then stop  Patient Instructions: 1)  Hold fish oil for 1 month.  2)  Omnicef 300mg   two times a day for 7 days  3)  Prednisone taper over next week.  4)  Please contact office for sooner follow up if symptoms do not improve or worsen  5)  follow up 2 weeks and as needed  Prescriptions: PREDNISONE 10 MG TABS (PREDNISONE) 4 tabs for 2 days, then 3 tabs for 2 days, 2 tabs for 2 days, then 1 tab for 2 days, then stop  #20 x 0   Entered and Authorized by:   Rubye Oaks NP   Signed by:   Rubye Oaks NP on 08/24/2010   Method used:   [facility]        [facility] [phone][facility] (retail)       [address]       [place], [place]  [address]       Ph: [phone] or [phone]       Fax: [phone]   RxID:   [id number] CEFDINIR 300 MG CAPS (CEFDINIR) 1 by mouth two times a day  #14 x 0   Entered and Authorized by:   Rubye Oaks NP   Signed by:   Tammy Parrett NP on 08/24/2010   Method used:   [facility]ically to        [facility] [phone]* (retail)       [address]       [place], [place]  [address]       Ph: [phone] or [phone]       Fax: [phone]   RxID:   [id number] PROAIR HFA 108 (90 BASE) MCG/ACT AERS (ALBUTEROL SULFATE) inhale 1-2 sprays every 4-6 H as needed for wheezing...  #1 x 2   Entered and Authorized by:   Rubye Oaks NP   Signed by:   Rubye Oaks NP on 08/24/2010   Method used:   [facility] to        [facility] [phone]* (retail)       [address]       [place], [place]  [address]       Ph: [phone] or [phone]       Fax: [phone]   RxID:   [id number] SIMVASTATIN 20 MG TABS (SIMVASTATIN) 1 by mouth at bedtime  #30 x 5   Entered and Authorized by:   Rubye Oaks NP   Signed by:   Rubye Oaks NP on 08/24/2010   Method used:   [facility] to        [facility] [phone]* (retail)       [address]       [place], [place]  [address]       Ph: [phone] or [phone]       Fax: [phone]   RxID:   [id number]    Immunization History:  Influenza Immunization History:    Influenza:  historical (06/27/2010)  Pneumovax Immunization History:    Pneumovax:  historical (06/27/2010)

## 2010-10-29 NOTE — Progress Notes (Signed)
Summary: PA for Androgel approved  Phone Note Call from Patient Call back at Work Phone [phone]   Caller: Patient Call For: nadel Reason for Call: Talk to Nurse Summary of Call: Patient calling asking about status of prior auth for androgel.  [facility] Initial call taken by: Lehman Prom,  September 14, 2010 9:34 AM  Follow-up for Phone Call        PA initiated for Androgel through [facility] at [phone]. Awaiting form for SN.Michel Bickers [facility]  September 14, 2010 11:36 AM  Form received and placed on SN's cart.Michel Bickers [facility]  September 14, 2010 11:41 AM  Additional Follow-up for Phone Call Additional follow up Details #1::        SN has signed form and form has been faxed back ---waiting on approval--papers placed back in triage. Randell Loop CMA  September 15, 2010 12:56 PM    recieved prior Berkley Harvey and it was approved through 09/14/2010-06/09/2010. [facility] and informed her. She states they will inform pt. Carver Fila  September 15, 2010 3:58 PM

## 2010-10-29 NOTE — Assessment & Plan Note (Signed)
Summary: NP follow up - bronchitis   Copy to:  [name] Primary Provider/Referring Provider:  [name]  CC:  2 week follow up - still not sleeping, fatigue, and dry cough.  wheezing and DOE have resolved.  would like to change his proair rx to ventolin.  History of Present Illness: 65 y/o WF here for a follow up visit & CPX...    ~  Oct09:  he feels well and has had a good year without new complaints or concerns... he still participates in several softball leagues including one for 20 year-olds and one for 60 year-olds [employer])... in 2005 he tripped and was knocked out- eval [place] ER w/ neg CT Brain- post concussion headache & referred to Neuro...   ~  August 22, 2009:  he developed a kidney stone 6/10 & has been eval by [name]- s/p lithotripsy and fragment still present, last seen 10/10 on Rapaflo, may need ureteroscopy (but no pain or hematuria now)...  today c/o tinnitus & wife wants further eval so we will refer him to ENT per request... 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   Feb 19, 2010 --Presents for work in visit for elevated blood pressure. Elevated BP onset yesterday 154/100 and 160/102 at exercise program with his employer. Denies chest pain, dyspnea, orthopnea, hemoptysis, fever, n/v/d, edema, headache,recent travel or antibiotics, speech/visual changes.  Not taking any new meds or otc meds. Recently started  new exercise regimen. In past has had well controlled b/p on average 110-120 systolic.   March 24, 2010--Returns for 1 month HTN - pt brought BP log with him today.  pt states he has been doing well, no complaints. He continues to be very active. Last visit. labs were good w/ cbc, bmet, tsh. b/p at home averaging 120-135 /85-95. today b/p looks great at 116/84. Denies chest pain, dyspnea, orthopnea, hemoptysis, fever, n/v/d, edema, headache,recent travel or  antibiotics August 24, 2010 - Presents for an acute office visit. Complains of wheezing, DOE, prod cough with tan mucus x2weeks. Has been treating at home with otc meds with no improvement. Over last 3 days cough and wheezing are getting worse. Having trouble sleeping with cough and wheezing. Denies chest pain,  orthopnea, hemoptysis, fever, n/v/d, edema, headache. Increased use of rescue inhaler.   09/07/10--Presents for follow up . He is feeling better w/ decreased cough/congestion. Last ov tx for URI w/ Cefdinir and prednisone taper. He does complain of low energy and trouble sleeping for long time.  Denies chest pain, dyspnea, orthopnea, hemoptysis, fever, n/v/d, edema, headache.   Medications Prior to Update: 1)  Lumigan 0.03 % Soln (Bimatoprost) .[name].. 1 Drop in Each Eye Daily 2)  Proair Hfa 108 (90 Base) Mcg/act Aers (Albuterol Sulfate) .... Inhale 1-2 Sprays Every 4-6 H As Needed For Wheezing... 3)  Adult Aspirin Low Strength 81 Mg  Tbdp (Aspirin) .... Take 1 Tablet By Mouth Once A Day 4)  Simvastatin 20 Mg Tabs (Simvastatin) .[name].. 1 By Mouth At Bedtime 5)  Fish Oil 1000 Mg  Caps (Omega-3 Fatty Acids) .... Take 1 Capsule By Mouth Two Times A Day 6)  Multivitamins   Tabs (Multiple Vitamin) .... Take 1 Tablet By Mouth Once A Day 7)  Vitamin C 500 Mg Tabs (Ascorbic Acid) .[name].. 1 By Mouth Two Times A Day 8)  Melatonin 3 Mg Tabs (Melatonin) .... 2 By Mouth At Bedtime 9)  Vitamin  D 1000 Unit Caps (Cholecalciferol) .... Take 1 Tablet By Mouth Once A Day 10)  Levitra 20 Mg Tabs (Vardenafil Hcl) .... Take As Directed... 11)  Cefdinir 300 Mg Caps (Cefdinir) .[name].. 1 By Mouth Two Times A Day 12)  Prednisone 10 Mg Tabs (Prednisone) .... 4 Tabs For 2 Days, Then 3 Tabs For 2 Days, 2 Tabs For 2 Days, Then 1 Tab For 2 Days, Then Stop 13)  Hydromet 5-1.5 Mg/16ml Syrp (Hydrocodone-Homatropine) .[name].. 1-2 Tsp Every 4-6 Hr As Needed Cough  Current Medications (verified): 1)  Lumigan 0.03 % Soln (Bimatoprost) .[name]..  1 Drop in Each Eye Daily 2)  Proair Hfa 108 (90 Base) Mcg/act Aers (Albuterol Sulfate) .... Inhale 1-2 Sprays Every 4-6 H As Needed For Wheezing... 3)  Adult Aspirin Low Strength 81 Mg  Tbdp (Aspirin) .... Take 1 Tablet By Mouth Once A Day 4)  Simvastatin 20 Mg Tabs (Simvastatin) .[name].. 1 By Mouth At Bedtime 5)  Fish Oil 1000 Mg  Caps (Omega-3 Fatty Acids) .... Take 1 Capsule By Mouth Two Times A Day 6)  Multivitamins   Tabs (Multiple Vitamin) .... Take 1 Tablet By Mouth Once A Day 7)  Vitamin C 500 Mg Tabs (Ascorbic Acid) .[name].. 1 By Mouth Two Times A Day 8)  Melatonin 3 Mg Tabs (Melatonin) .... 2 By Mouth At Bedtime 9)  Vitamin D 1000 Unit Caps (Cholecalciferol) .... Take 1 Tablet By Mouth Once A Day 10)  Levitra 20 Mg Tabs (Vardenafil Hcl) .... Take As Directed... 11)  Hydromet 5-1.5 Mg/62ml Syrp (Hydrocodone-Homatropine) .[name].. 1-2 Tsp Every 4-6 Hr As Needed Cough  Allergies (verified): No Known Drug Allergies  Past History:  Past Medical History: Last updated: 08/22/2009 GLAUCOMA (ICD-365.9) ALLERGIC RHINITIS (ICD-477.9) OBSTRUCTIVE SLEEP APNEA (ICD-327.23) ASTHMATIC BRONCHITIS, ACUTE (ICD-466.0) Family Hx of CARDIAC DISEASE (ICD-429.9) HYPERCHOLESTEROLEMIA (ICD-272.0) COLONIC POLYPS (ICD-211.3) NEPHROLITHIASIS (ICD-592.0)  Past Surgical History: Last updated: 08/22/2009 S/P rhinoplasty in the 1980s  Family History: Last updated: 12-06-2008 Father died age 49 w/ ? MI Mother alive in her 94s w/ pacemaker 3 Siblings- all brothers: 1 Bro w/ carotid dis & smokes 1 Bro w/ CABG 1 Bro w/ stents allergies: son  Social History: Last updated: 12/06/2008 never smoked exercises 5-6 times a week no caffeine married and lives with spouse.  2 children works as a Production designer, theatre/television/film.  Risk Factors: Smoking Status: never (06-Dec-2008)  Review of Systems      See HPI  Vital Signs:  Patient profile:   65 year old male Height:      72 inches Weight:      208.25 pounds BMI:     28.35 O2  Sat:      99 % on Room air Temp:     98.0 degrees F oral Pulse rate:   75 / minute BP sitting:   142 / 102  (left arm) Cuff size:   regular  Vitals Entered By: [name] (September 07, 2010 3:24 PM)  O2 Flow:  Room air CC: 2 week follow up - still not sleeping, fatigue, dry cough.  wheezing and DOE have resolved.  would like to change his proair rx to ventolin Is Patient Diabetic? No Comments Medications reviewed with patient Daytime contact number verified with patient. [name]  September 07, 2010 3:23 PM    Physical Exam  Additional Exam:  GEN: A/Ox3; pleasant , NAD HEENT:  [place]/AT, , EACs-clear, TMs-wnl, NOSE-clear drainage , THROAT-clear NECK:  Supple w/ fair ROM; no JVD; normal carotid impulses  w/o bruits; no thyromegaly or nodules palpated; no lymphadenopathy. RESP  Coarse BS  CARD:  RRR, no m/r/g  , no bruits  GI:   Soft & nt; nml bowel sounds; no organomegaly or masses detected. [place]: Warm bil,  no calf tenderness edema, clubbing, pulses intact Neuro: intact w/ no focal deficits    Impression & Recommendations:  Problem # 1:  BRONCHITIS, ACUTE (ICD-466.0)  resolved.    The following medications were removed from the medication list:    Cefdinir 300 Mg Caps (Cefdinir) .[name]... 1 by mouth two times a day His updated medication list for this problem includes:    Ventolin Hfa 108 (90 Base) Mcg/act Aers (Albuterol sulfate) .[name]... 2 puffs every 4-6 hr as needed wheezing    Hydromet 5-1.5 Mg/27ml Syrp (Hydrocodone-homatropine) .[name]... 1-2 tsp every 4-6 hr as needed cough  Orders: Est. Patient Level IV (81191)  Problem # 2:  TESTICULAR HYPOFUNCTION (ICD-257.2)  low energy and fatigue complaints.  testosterone level is low  will start on androgel.   Orders: Est. Patient Level IV (47829)  Medications Added to Medication List This Visit: 1)  Ventolin Hfa 108 (90 Base) Mcg/act Aers (Albuterol sulfate) .... 2 puffs every 4-6 hr as needed wheezing 2)   Androgel Pump 1.25 Gm/act (1%) Gel (Testosterone) .... 4 pumps daily , altenate sites  Other Orders: TLB-Testosterone, Total (84403-[id number])  Patient Instructions: 1)  I will call with lab results.  2)  follow up Dr. Kriste Basque as planned in 2 months for physical.  3)  follow up Dr. Shelle Iron in 4-6 weeks  4)  Please contact office for sooner follow up if symptoms do not improve or worsen  Prescriptions: ANDROGEL PUMP 1.25 GM/ACT (1%) GEL (TESTOSTERONE) 4 pumps daily , altenate sites  #1 x 0   Entered and Authorized by:   Rubye Oaks NP   Signed by:   Tammy Parrett NP on 09/08/2010   Method used:   Print then Give to Patient   RxID:   [id number] VENTOLIN HFA 108 (90 BASE) MCG/ACT AERS (ALBUTEROL SULFATE) 2 puffs every 4-6 hr as needed wheezing  #1 x 3   Entered and Authorized by:   Rubye Oaks NP   Signed by:   Tammy Parrett NP on 09/07/2010   Method used:   Print then Give to Patient   RxID:   [id number]

## 2010-10-29 NOTE — Medication Information (Signed)
Summary: Tax adviser   Imported By: Valinda Hoar 09/15/2010 16:24:02  _____________________________________________________________________  External Attachment:    Type:   Image     Comment:   External Document

## 2010-10-29 NOTE — Progress Notes (Signed)
Summary: cough, sore throat  Phone Note Call from Patient Call back at Home Phone [phone]   Caller: Spouse//joanne Call For: nadel Summary of Call: Pt c/o sore throat, cough, beige to green phlegm x 4-5 days and laryngitis since yesterday wants something called in pls advise.//[facility] Initial call taken by: Darletta Moll,  September 29, 2010 9:21 AM  Follow-up for Phone Call        Last OV with SN 08/22/09 Pending OV with SN 11/19/10  Called, spoke wtih Randa Evens.  Per Randa Evens, pt has sore throat, prod cough with brown to green mucus, and HA off and on x 4-5 days.  Laryngitis today.  Denies fever.  Using cough syrup and tylenol with no relief.  Requesting meds  nkda - [facility]  Dr. Kriste Basque, pls advise.  Thanks! Follow-up by: Gweneth Dimitri RN,  September 29, 2010 10:53 AM  Additional Follow-up for Phone Call Additional follow up Details #1::        per SN: augmentin 875mg  1 by mouth two times a day #14 and mucinex 2 by mouth two times a day and fluids.  chloraseptic spray for throat OTC. [name]  September 29, 2010 11:32 AM     Additional Follow-up for Phone Call Additional follow up Details #2::    Spoke with pt's spouse and notified of the above recs per SN.  She verbalized understanding and [facility]. Follow-up by: Vernie Murders,  September 29, 2010 12:16 PM  New/Updated Medications: AUGMENTIN 875-125 MG TABS (AMOXICILLIN-POT CLAVULANATE) 1 by mouth two times a day until gone Prescriptions: AUGMENTIN 875-125 MG TABS (AMOXICILLIN-POT CLAVULANATE) 1 by mouth two times a day until gone  #14 x 0   Entered by:   Vernie Murders   Authorized by:   Michele Mcalpine MD   Signed by:   Vernie Murders on 09/29/2010   Method used:   [facility] to        [facility] [phone]* (retail)       [address]       [place], [place]  [address]       Ph: [phone] or [phone]       Fax: [phone]   RxID:    [id number]

## 2010-11-04 ENCOUNTER — Telehealth: Payer: Self-pay | Admitting: Pulmonary Disease

## 2010-11-04 NOTE — Assessment & Plan Note (Signed)
Summary: rov for osa    Copy to:  [name] Primary Provider/Referring Provider:  [name]  CC:  OSA follow up. Pt states is using CPAP approx 6 to 7 hours each night. Pt states is still snoring occ.  History of Present Illness: the pt comes in today for f/u of his known osa.  He is wearing cpap compliantly, and although aggrevates him, does believe that it helps his sleep and daytime alertness.  He is having no issues with his mask or pressure, and has been keeping up with supplies.  He has asked about other possible alternatives, and we have discussed dental appliances.  However, he understands it has limited 100% success in severe disease.    Preventive Screening-Counseling & Management  Alcohol-Tobacco     Smoking Status: never  Current Medications (verified): 1)  Lumigan 0.03 % Soln (Bimatoprost) .[name].. 1 Drop in Each Eye Daily 2)  Ventolin Hfa 108 (90 Base) Mcg/act Aers (Albuterol Sulfate) .... 2 Puffs Every 4-6 Hr As Needed Wheezing 3)  Adult Aspirin Low Strength 81 Mg  Tbdp (Aspirin) .... Take 1 Tablet By Mouth Once A Day 4)  Simvastatin 20 Mg Tabs (Simvastatin) .[name].. 1 By Mouth At Bedtime 5)  Fish Oil 1000 Mg  Caps (Omega-3 Fatty Acids) .... Take 1 Capsule By Mouth Two Times A Day 6)  Multivitamins   Tabs (Multiple Vitamin) .... Take 1 Tablet By Mouth Once A Day 7)  Vitamin C 500 Mg Tabs (Ascorbic Acid) .[name].. 1 By Mouth Two Times A Day 8)  Melatonin 3 Mg Tabs (Melatonin) .... 2 By Mouth At Bedtime 9)  Vitamin D 1000 Unit Caps (Cholecalciferol) .... Take 1 Tablet By Mouth Once A Day 10)  Levitra 20 Mg Tabs (Vardenafil Hcl) .... Take As Directed... 11)  Cpap .... Ahc  Allergies (verified): No Known Drug Allergies  Review of Systems  The patient denies shortness of breath with activity, shortness of breath at rest, productive cough, non-productive cough, coughing up blood, chest pain, irregular heartbeats, acid heartburn, indigestion, loss of appetite, weight change,  abdominal pain, difficulty swallowing, sore throat, tooth/dental problems, headaches, nasal congestion/difficulty breathing through nose, sneezing, itching, ear ache, anxiety, depression, hand/feet swelling, joint stiffness or pain, rash, change in color of mucus, and fever.    Vital Signs:  Patient profile:   65 year old male Height:      72 inches Weight:      208 pounds BMI:     28.31 O2 Sat:      98 % on Room air Temp:     98.2 degrees F oral Pulse rate:   92 / minute BP sitting:   108 / 86  (left arm) Cuff size:   regular  Vitals Entered By: Zackery Barefoot CMA (October 16, 2010 10:13 AM)  O2 Flow:  Room air CC: OSA follow up. Pt states is using CPAP approx 6 to 7 hours each night. Pt states is still snoring occ Comments Medications reviewed with patient Verified contact number and pharmacy with patient Zackery Barefoot CMA  October 16, 2010 10:13 AM    Physical Exam  General:  ow male in nad Nose:  no skin breakdown or pressure necrosis from cpap mask Extremities:  no singnficant edema, no cyanosis  Neurologic:  alert, does not appear sleepy, moves all 4.   Impression & Recommendations:  Problem # 1:  OBSTRUCTIVE SLEEP APNEA (ICD-327.23)  the pt has moderate to severe osa, and has been doing ok on cpap.  He sleeps well, has excellent daytime alertness, but dislikes wearing cpap.  He is willing to continue with this, but we have also discussed the possibility of a dental appliance.  I have given him some literature, and he will research on his own.  He will call me if he wishes to consider.  In the meantime, stay on cpap and work on modest weight loss.  Medications Added to Medication List This Visit: 1)  Cpap  .... Ahc  Other Orders: Est. Patient Level III (04540)  Patient Instructions: 1)  continue on cpap for now, but can research dental appliance.  Let me know if you would like to consider this. 2)  work on weight loss 3)  followup with me in one year.

## 2010-11-12 NOTE — Progress Notes (Signed)
Summary: speak to dr Aubrey Blackard / oral appliance  Phone Note Call from Patient   Caller: Patient Call For: Jameson Morrow Summary of Call: pt calling to "briefly discuss oral appliance" with kc. if kc calls pt today- call work # [phone]. if calling tomorrow call pt at home # [phone] Initial call taken by: Tivis Ringer, CNA,  November 04, 2010 1:29 PM  Follow-up for Phone Call        Pt states he read over the literature and researched the dental appliance online and is interested in it. Now wants to know the next step. Please advise. Thanks! Zackery Barefoot CMA  November 04, 2010 3:33 PM   Additional Follow-up for Phone Call Additional follow up Details #1::        will get referred for dental appliance Additional Follow-up by: Barbaraann Share MD,  November 05, 2010 1:08 PM

## 2010-11-19 ENCOUNTER — Ambulatory Visit (INDEPENDENT_AMBULATORY_CARE_PROVIDER_SITE_OTHER): Payer: BC Managed Care – PPO | Admitting: Pulmonary Disease

## 2010-11-19 ENCOUNTER — Other Ambulatory Visit: Payer: BC Managed Care – PPO

## 2010-11-19 ENCOUNTER — Encounter: Payer: Self-pay | Admitting: Pulmonary Disease

## 2010-11-19 ENCOUNTER — Other Ambulatory Visit: Payer: Self-pay | Admitting: Pulmonary Disease

## 2010-11-19 ENCOUNTER — Ambulatory Visit (INDEPENDENT_AMBULATORY_CARE_PROVIDER_SITE_OTHER)
Admission: RE | Admit: 2010-11-19 | Discharge: 2010-11-19 | Disposition: A | Discharge status: Home / Self Care | Payer: BC Managed Care – PPO | Source: Ambulatory Visit | Attending: Pulmonary Disease | Admitting: Pulmonary Disease

## 2010-11-19 DIAGNOSIS — E7800 Pure hypercholesterolemia, unspecified: Secondary | ICD-10-CM

## 2010-11-19 DIAGNOSIS — R030 Elevated blood-pressure reading, without diagnosis of hypertension: Secondary | ICD-10-CM

## 2010-11-19 DIAGNOSIS — Z0000 Encounter for general adult medical examination without abnormal findings: Secondary | ICD-10-CM

## 2010-11-19 DIAGNOSIS — J209 Acute bronchitis, unspecified: Secondary | ICD-10-CM

## 2010-11-19 DIAGNOSIS — N200 Calculus of kidney: Secondary | ICD-10-CM

## 2010-11-19 DIAGNOSIS — E291 Testicular hypofunction: Secondary | ICD-10-CM

## 2010-11-19 DIAGNOSIS — D126 Benign neoplasm of colon, unspecified: Secondary | ICD-10-CM

## 2010-11-19 DIAGNOSIS — R5381 Other malaise: Secondary | ICD-10-CM

## 2010-11-19 DIAGNOSIS — G4733 Obstructive sleep apnea (adult) (pediatric): Secondary | ICD-10-CM

## 2010-11-19 DIAGNOSIS — R5383 Other fatigue: Secondary | ICD-10-CM

## 2010-11-19 LAB — URINALYSIS, ROUTINE W REFLEX MICROSCOPIC
Hgb urine dipstick: NEGATIVE
Nitrite: NEGATIVE
Specific Gravity, Urine: 1.01 (ref 1.000–1.030)
Total Protein, Urine: NEGATIVE
Urobilinogen, UA: 0.2 (ref 0.0–1.0)

## 2010-11-19 LAB — LIPID PANEL
Cholesterol: 121 mg/dL (ref 0–200)
HDL: 28.3 mg/dL — ABNORMAL LOW (ref >39.00)
Triglycerides: 182 mg/dL — ABNORMAL HIGH (ref 0.0–149.0)
VLDL: 36.4 mg/dL (ref 0.0–40.0)

## 2010-11-19 LAB — CBC WITH DIFFERENTIAL/PLATELET
Basophils Absolute: 0 10*3/uL (ref 0.0–0.1)
Eosinophils Absolute: 0.1 10*3/uL (ref 0.0–0.7)
Lymphocytes Relative: 32.7 % (ref 12.0–46.0)
MCHC: 34.3 g/dL (ref 30.0–36.0)
Monocytes Relative: 9.9 % (ref 3.0–12.0)
Neutro Abs: 3.2 10*3/uL (ref 1.4–7.7)
Neutrophils Relative %: 54.7 % (ref 43.0–77.0)
Platelets: 174 10*3/uL (ref 150.0–400.0)
RDW: 13.1 % (ref 11.5–14.6)

## 2010-11-19 LAB — BASIC METABOLIC PANEL
BUN: 15 mg/dL (ref 6–23)
GFR: 67.15 mL/min (ref >60.00)
Potassium: 4.6 mEq/L (ref 3.5–5.1)
Sodium: 141 mEq/L (ref 135–145)

## 2010-11-19 LAB — HEPATIC FUNCTION PANEL
AST: 30 U/L (ref 0–37)
Alkaline Phosphatase: 66 U/L (ref 39–117)
Bilirubin, Direct: 0.2 mg/dL (ref 0.0–0.3)
Total Bilirubin: 0.9 mg/dL (ref 0.3–1.2)

## 2010-11-19 LAB — TSH: TSH: 1.45 u[IU]/mL (ref 0.35–5.50)

## 2010-11-22 LAB — CONVERTED CEMR LAB: Vit D, 25-Hydroxy: 34 ng/mL (ref 30–89)

## 2010-12-03 NOTE — Assessment & Plan Note (Signed)
Summary: yearly follow up/jj   Primary Care Provider:  [name]  CC:  15 month ROV & f/u mult medical problems....  History of Present Illness: 65 y/o WF here for a follow up visit... he has multiple medical problems as noted below...     ~  August 22, 2009:  he developed a kidney stone 6/10 & has been eval by [name]- s/p lithotripsy and fragment still present, last seen 10/10 on Rapaflo, may need ureteroscopy (but no pain or hematuria now)...  today c/o tinnitus & wife wants further eval so we will refer him to ENT per request... also notes ED & retrograde ejac ?from Rapaflo & he will discuss this w/ [name] in f/u visit...    ~  November 19, 2010:  He had cats found by Optometrist recently & referred to [facility] surg.    He saw DrClance for sleep f/u 1/12> no issues w/ CPAP or mask, compliant w/ Rx, improved daytime alertness;  he has mod to severe dis & dislikes the CPAP- alternatives discussed; advised wt reduction & he wants to try oral appliance, has appt w/ [place]Katz in [employer] [place]...    He had colonoscopy DrStark 12/10> 2 polyps removed & one was a tubular adenoma & removed piecemeal therefore rec f/u 47yrs...     He saw Urology [name] for kidney stones after last visit & he wanted to do ureteroscopy by pt declined & has remained asymptomatic he says...    He noted low energy etc & labs checked by TP 12/11 showed Low-T w/ testos level = 140 (350-890);  he was started on Topical product, Androgel after prior auth done & approved but only took it for 1 month & stopped (misunderstood); we will recheck level & discuss options...    He noted elev BP in 2011 & eval by TP w/ rec for low sodium diet & home monitoring> he notes BP has been fine & 132/82 today;  Chol looks good on Simva10 but TG elev & needs better low fat diet..   Current Problems:   PHYSICAL EXAMINATION (ICD-V70.0) - he takes ASA 81mg /d, and Calcium/ Vitamins/ etc...  GLAUCOMA (ICD-365.9) - on LUMIGAN eye drops  per Ophthalmology...  ~  He had cats found by Optometrist recently & referred to [facility] for surg.  ALLERGIC RHINITIS (ICD-477.9) - uses OTC antihistamines Prn... prev on Rhinocort.  OBSTRUCTIVE SLEEP APNEA (ICD-327.23) - had f/u eval DrClance in 2010- optimal CPAP= 11, and using it regularly now... notes some mild snoring, but apparently doesn't disturb his wife... rests well, wakes refreshed, no daytime hypersomnolence...  ~  He saw DrClance for sleep f/u 1/12> no issues w/ CPAP or mask, compliant w/ Rx, improved daytime alertness;  he has mod to severe dis & dislikes the CPAP- alternatives discussed; advised wt reduction & he wants to try oral appliance, [place] app[place]/ [place]atz in [place]...  ASTHMATIC BRONCHITIS, ACUTE (ICD-466.0) - he has been stable without signif URIs and no recent exas even w/ his strenuous activity & softball games... uses PROAIR Prn & SINGULAIR 10mg  Prn as well... prev on Asmanex but hasn't needed this yr.  Family Hx of CARDIAC DISEASE (ICD-429.9) - Father died age 17 w/ ?MI, 1 brother w/ CABG, one brother w/ stents... we discussed control of secondary risk factors, ASA, etc...  he denies CP, palpit, SOB, etc...  ~  NuclearStressTest 3/08 was neg- no ischemia, no infarct, EF= 58%...  HYPERCHOLESTEROLEMIA (ICD-272.0) - on SIMVASTATIN 10mg /d, & FISH OIL 1000mg /d...  ~  FLP 3/08 showed TChol 128, TG 147, HDL 31, LDL 67  ~  FLP 11/09 showed TChol 124, TG 122, HDL 33, LDL 67  ~  FLP 11/10 showed TChol 120, TG 150, HDL 31, LDL 59  ~  FLP 2/12 showed TChol 121, TG 182, HDL 28, LDL 56... rec better low fat diet & incr exerc.  COLONIC POLYPS (ICD-211.3) - colonoscopy 2/05 by DrStark showed 3mm polyp removed= tubular adenoma...  ~  He had colonoscopy DrStark 12/10> 2 polyps removed & one was a tubular adenoma & removed piecemeal therefore rec f/u 28yrs...   NEPHROLITHIASIS (ICD-592.0) - eval 6/10 by DrDahlstadt w/ Lithotripsy required... given RAPAFLO to aide passing the stone.  ~  He  saw Urology [name] for kidney stones after last visit & he wanted to do ureteroscopy by pt declined & has remained asymptomatic he says...  TESTICULAR HYPOFUNCTION (ICD-257.2)  ~  He noted low energy etc & labs checked by TP 12/11 showed Low-T w/ testos level = 140 (350-890);  he was started on Topical product, Androgel after prior auth done & approved but only took it for 1 month & stopped (misunderstood); we will recheck level & discuss options...  ~  labs 2/12 showed Testosterone level = 240 & rec ANDROGEL vs Urology f/u, he will decide.   Preventive Screening-Counseling & Management  Alcohol-Tobacco     Smoking Status: never  Allergies (verified): No Known Drug Allergies  Comments:  Nurse/Medical Assistant: The patient's medications and allergies were reviewed with the patient and were updated in the Medication and Allergy Lists.  Past History:  Past Medical History: GLAUCOMA (ICD-365.9) ALLERGIC RHINITIS (ICD-477.9) OBSTRUCTIVE SLEEP APNEA (ICD-327.23) ASTHMATIC BRONCHITIS, ACUTE (ICD-466.0) Family Hx of CARDIAC DISEASE (ICD-429.9) HYPERCHOLESTEROLEMIA (ICD-272.0) COLONIC POLYPS (ICD-211.3) NEPHROLITHIASIS (ICD-592.0) TESTICULAR HYPOFUNCTION (ICD-257.2)  Past Surgical History: S/P rhinoplasty in the 48s  Family History: Reviewed history from 12/04/2008 and no changes required. Father died age 55 w/ ? MI Mother alive in her 32s w/ pacemaker 3 Siblings- all brothers: 1 Bro w/ carotid dis & smokes 1 Bro w/ CABG 1 Bro w/ stents allergies: son  Social History: Reviewed history from 12/04/2008 and no changes required. never smoked exercises 5-6 times a week no caffeine married and lives with spouse.  2 children works as a Development worker, community  The patient denies fever, chills, sweats, anorexia, fatigue, weakness, malaise, weight loss, sleep disorder, blurring, diplopia, eye irritation, eye discharge, vision loss, eye pain, photophobia, earache, ear  discharge, tinnitus, decreased hearing, nasal congestion, nosebleeds, sore throat, hoarseness, chest pain, palpitations, syncope, dyspnea on exertion, orthopnea, PND, peripheral edema, cough, dyspnea at rest, excessive sputum, hemoptysis, wheezing, pleurisy, nausea, vomiting, diarrhea, constipation, change in bowel habits, abdominal pain, melena, hematochezia, jaundice, gas/bloating, indigestion/heartburn, dysphagia, odynophagia, dysuria, hematuria, urinary frequency, urinary hesitancy, nocturia, incontinence, back pain, joint pain, joint swelling, muscle cramps, muscle weakness, stiffness, arthritis, sciatica, restless legs, leg pain at night, leg pain with exertion, rash, itching, dryness, suspicious lesions, paralysis, paresthesias, seizures, tremors, vertigo, transient blindness, frequent falls, frequent headaches, difficulty walking, depression, anxiety, memory loss, confusion, cold intolerance, heat intolerance, polydipsia, polyphagia, polyuria, unusual weight change, abnormal bruising, bleeding, enlarged lymph nodes, urticaria, allergic rash, hay fever, and recurrent infections.    Vital Signs:  Patient profile:   65 year old male Height:      72 inches Weight:      205.38 pounds BMI:     27.96 O2 Sat:      100 % on Room air Temp:  98.3 degrees F oral Pulse rate:   73 / minute BP sitting:   132 / 84  (left arm) Cuff size:   regular  Vitals Entered By: Randell Loop CMA (November 19, 2010 8:59 AM)  O2 Sat at Rest %:  100 O2 Flow:  Room air CC: 15 month ROV & f/u mult medical problems... Is Patient Diabetic? No Pain Assessment Patient in pain? no      Comments no changes in meds today   Physical Exam  Additional Exam:  WD, WN, 65 y/o WM in NAD... GENERAL:  Alert & oriented; pleasant & cooperative. HEENT:  /AT, EOM-wnl, Hx Glaucoma on gtts, EACs-clear, TMs-wnl, NOSE-clear, THROAT-clear & wnl. NECK:  Supple w/ full ROM; no JVD; normal carotid impulses w/o bruits; no  thyromegaly or nodules palpated; no lymphadenopathy. CHEST:  Clear to P & A; without wheezes/ rales/ or rhonchi. HEART:  Regular Rhythm; without murmurs/ rubs/ or gallops. ABDOMEN:  Soft & nontender; normal bowel sounds; no organomegaly or masses detected. RECTAL:  Neg - prostate 2+ & nontender w/o nodules; stool hematest neg. EXT: without deformities or arthritic changes; no varicose veins/ venous insuffic/ or edema. NEURO:  CN's intact; motor testing normal; sensory testing normal; gait normal & balance OK. DERM:  No lesions noted; no rash etc...    Impression & Recommendations:  Problem # 1:  GLAUCOMA (ICD-365.9) Glaucoma on gtts & recent cats discovered> pending referral to Ophthalmology for surg...  Problem # 2:  OBSTRUCTIVE SLEEP APNEA (ICD-327.23) Followed by DrClance & stable on CPAP. Orders: T-2 View CXR (71020TC)  Problem # 3:  ASTHMATIC BRONCHITIS, ACUTE (ICD-466.0) Stable>  no recent exac... His updated medication list for this problem includes:    Ventolin Hfa 108 (90 Base) Mcg/act Aers (Albuterol sulfate) .[name]... 2 puffs every 4-6 hr as needed wheezing  Orders: T-2 View CXR (71020TC) T-Vitamin D (25-Hydroxy) (40981-19147) TLB-BMP (Basic Metabolic Panel-BMET) (80048-METABOL) TLB-Hepatic/Liver Function Pnl (80076-HEPATIC) TLB-CBC Platelet - w/Differential (85025-CBCD) TLB-Lipid Panel (80061-LIPID) TLB-TSH (Thyroid Stimulating Hormone) (84443-TSH) TLB-PSA (Prostate Specific Antigen) (84153-PSA) TLB-Testosterone, Total (84403-TESTO) TLB-Udip w/ Micro (81001-URINE)  Problem # 4:  HYPERCHOLESTEROLEMIA (ICD-272.0) Stable on Simva20> but elev TG & needs better low fat diet... His updated medication list for this problem includes:    Simvastatin 20 Mg Tabs (Simvastatin) .[name]... 1 by mouth at bedtime  Problem # 5:  COLONIC POLYPS (ICD-211.3) GI is stable & up to date...  Problem # 6:  TESTICULAR HYPOFUNCTION (ICD-257.2) He has Low-T & we discussed options> Androgel vs  Urology consult for shots etc...  Problem # 7:  OTHER MEDICAL PROBLEMS AS NOTED>>>  Complete Medication List: 1)  Lumigan 0.03 % Soln (Bimatoprost) .[name].. 1 drop in each eye daily 2)  Cpap  .... Ahc 3)  Ventolin Hfa 108 (90 Base) Mcg/act Aers (Albuterol sulfate) .... 2 puffs every 4-6 hr as needed wheezing 4)  Adult Aspirin Low Strength 81 Mg Tbdp (Aspirin) .... Take 1 tablet by mouth once a day 5)  Simvastatin 20 Mg Tabs (Simvastatin) .[name].. 1 by mouth at bedtime 6)  Fish Oil 1000 Mg Caps (Omega-3 fatty acids) .... Take 1 capsule by mouth two times a day 7)  Multivitamins Tabs (Multiple vitamin) .... Take 1 tablet by mouth once a day 8)  Vitamin C 500 Mg Tabs (Ascorbic acid) .[name].. 1 by mouth two times a day 9)  Melatonin 3 Mg Tabs (Melatonin) .... 2 by mouth at bedtime 10)  Vitamin D 1000 Unit Caps (Cholecalciferol) .... Take 1 tablet by mouth  once a day 11)  Levitra 20 Mg Tabs (Vardenafil hcl) .... Take as directed...  Other Orders: EKG w/ Interpretation (93000)  Patient Instructions: 1)  Today we updated your med list- see below.... 2)  We refilled your current meds today... 3)  Today we did your CXR, EKG, & fasting blood work including a recheck testosterone level...  4)  please call the "phone tree" in a few days for your lab results (& we will call your regarding any additional medication requirements)... 5)  Call for any questions.Marland KitchenMarland Kitchen 6)  Please schedule a follow-up appointment in 1 year, sooner as needed... Prescriptions: LEVITRA 20 MG TABS (VARDENAFIL HCL) take as directed...  #10 x prn   Entered and Authorized by:   Michele Mcalpine MD   Signed by:   Michele Mcalpine MD on 11/19/2010   Method used:   Print then Give to Patient   RxID:   [id number] SIMVASTATIN 20 MG TABS (SIMVASTATIN) 1 by mouth at bedtime  #90 x prn   Entered and Authorized by:   Michele Mcalpine MD   Signed by:   Michele Mcalpine MD on 11/19/2010   Method used:   Print then Give to Patient   RxID:    [id number] VENTOLIN HFA 108 (90 BASE) MCG/ACT AERS (ALBUTEROL SULFATE) 2 puffs every 4-6 hr as needed wheezing  #1 x 6   Entered and Authorized by:   Michele Mcalpine MD   Signed by:   Michele Mcalpine MD on 11/19/2010   Method used:   Print then Give to Patient   RxID:   [id number]

## 2011-02-09 NOTE — Procedures (Signed)
Jeremy Meadows, Jeremy Meadows NO.:  [id number]   MEDICAL RECORD NO.:  [id number]         PATIENT TYPE:  OUT   LOCATION:  SLEEP CENTER                 FACILITY[facility]  [facility]   PHYSICIAN:  Barbaraann Share, MD,FCCPDATE OF BIRTH:  1946/09/14   DATE OF STUDY:  11/19/2008                            NOCTURNAL POLYSOMNOGRAM   REFERRING PHYSICIAN:   REFERRING PHYSICIAN:  Lonzo Cloud. Kriste Basque, MD.   INDICATION FOR STUDY:  Hypersomnia with sleep apnea.   EPWORTH SCORE:  3.   SLEEP ARCHITECTURE:  The patient had total sleep time of 230 minutes  with no slow wave sleep and less than 2 minutes of REM noted.  Sleep  onset latency was normal at 15 minutes, and REM onset was prolonged at  212 minutes.  Sleep efficiency was poor at 58% with very fragmented  sleep.   RESPIRATORY DATA:  The patient was found to have seven obstructive  apneas and 73 obstructive hypopneas, giving him an apnea-hypopnea index  of 21 events per hour.  He was also noted to have 165 respiratory effort  related arousals giving him a respiratory disturbance index of 64 events  per hour.  The events were not positional.  There was loud-to-severe  snoring noted throughout.   OXYGEN DATA:  The patient had O2 desaturation as low as 86% with his  obstructive events.   CARDIAC DATA:  Occasional PVCs were noted, but no clinically significant  arrhythmias seen.   MOVEMENT-PARASOMNIA:  There were no significant leg jerks or abnormal  behaviors noted.   IMPRESSION-RECOMMENDATIONS:  1. Moderate-to-severe obstructive sleep apnea with an apnea-hypopnea      index of 21 events per hour, and a respiratory disturbance index of      64 events per hour.  Treatment for this degree of sleep apnea      should focus primarily on continuous positive airway pressure and      weight loss; however, surgery and a dental appliance      could also be considered.  2. Occasional premature ventricular contractions noted, but no      clinically  significant arrhythmias seen.      Barbaraann Share, MD,FCCP  Diplomate, American Board of Sleep  Medicine  Electronically Signed     [facility]/MEDQ  D:  11/30/2008 15:31:48  T:  12/01/2008 04:04:51  Job:  [id number]

## 2011-03-08 ENCOUNTER — Emergency Department: Payer: Self-pay | Admitting: Unknown Physician Specialty

## 2011-06-28 ENCOUNTER — Telehealth: Payer: Self-pay | Admitting: Pulmonary Disease

## 2011-06-28 MED ORDER — AMOXICILLIN-POT CLAVULANATE 875-125 MG PO TABS: 1.0000 | ORAL_TABLET | Freq: Two times a day (BID) | ORAL | Status: AC

## 2011-06-28 NOTE — Telephone Encounter (Signed)
Per SN---ok for pt to have augmentin 875mg    #14   1 po bid until gone and mucinex otc 2 po bid with plenty of fluids.   thanks

## 2011-06-28 NOTE — Telephone Encounter (Signed)
[email or web address].Donevan Biller, CMA  

## 2011-06-28 NOTE — Telephone Encounter (Signed)
Spoke with the pt and he is c/o productive cough with green phlegm, chest congestion, temp of 99.9 x 1 week. He states he has been taking dayquil without relief. Please advise.Carron Curie, CMA NKDA

## 2011-06-28 NOTE — Telephone Encounter (Signed)
Pt aware of SN recs and states he would like rx sent to [facility] [place]. Nothing further was needed

## 2011-09-07 ENCOUNTER — Ambulatory Visit (INDEPENDENT_AMBULATORY_CARE_PROVIDER_SITE_OTHER): Payer: BC Managed Care – PPO

## 2011-09-07 DIAGNOSIS — Z23 Encounter for immunization: Secondary | ICD-10-CM

## 2011-09-28 HISTORY — PX: COLONOSCOPY: SHX174

## 2011-10-15 ENCOUNTER — Ambulatory Visit: Payer: Self-pay | Admitting: Pulmonary Disease

## 2011-10-18 ENCOUNTER — Encounter: Payer: Self-pay | Admitting: Gastroenterology

## 2011-11-23 ENCOUNTER — Ambulatory Visit: Payer: BC Managed Care – PPO | Admitting: Pulmonary Disease

## 2011-12-01 ENCOUNTER — Encounter: Payer: Self-pay | Admitting: Pulmonary Disease

## 2011-12-02 ENCOUNTER — Encounter: Payer: Self-pay | Admitting: Gastroenterology

## 2011-12-02 ENCOUNTER — Other Ambulatory Visit (INDEPENDENT_AMBULATORY_CARE_PROVIDER_SITE_OTHER): Payer: BC Managed Care – PPO

## 2011-12-02 ENCOUNTER — Ambulatory Visit (INDEPENDENT_AMBULATORY_CARE_PROVIDER_SITE_OTHER): Payer: BC Managed Care – PPO | Admitting: Pulmonary Disease

## 2011-12-02 ENCOUNTER — Encounter: Payer: Self-pay | Admitting: Pulmonary Disease

## 2011-12-02 ENCOUNTER — Other Ambulatory Visit: Payer: Self-pay | Admitting: *Deleted

## 2011-12-02 ENCOUNTER — Ambulatory Visit (INDEPENDENT_AMBULATORY_CARE_PROVIDER_SITE_OTHER)
Admission: RE | Admit: 2011-12-02 | Discharge: 2011-12-02 | Disposition: A | Discharge status: Home / Self Care | Payer: BC Managed Care – PPO | Source: Ambulatory Visit | Attending: Pulmonary Disease | Admitting: Pulmonary Disease

## 2011-12-02 VITALS — BP 138/94 | HR 81 | Temp 97.0°F | Ht 72.0 in | Wt 194.4 lb

## 2011-12-02 DIAGNOSIS — D126 Benign neoplasm of colon, unspecified: Secondary | ICD-10-CM

## 2011-12-02 DIAGNOSIS — E291 Testicular hypofunction: Secondary | ICD-10-CM

## 2011-12-02 DIAGNOSIS — Z23 Encounter for immunization: Secondary | ICD-10-CM

## 2011-12-02 DIAGNOSIS — E7800 Pure hypercholesterolemia, unspecified: Secondary | ICD-10-CM

## 2011-12-02 DIAGNOSIS — N200 Calculus of kidney: Secondary | ICD-10-CM

## 2011-12-02 DIAGNOSIS — J209 Acute bronchitis, unspecified: Secondary | ICD-10-CM

## 2011-12-02 DIAGNOSIS — G4733 Obstructive sleep apnea (adult) (pediatric): Secondary | ICD-10-CM

## 2011-12-02 DIAGNOSIS — Z0000 Encounter for general adult medical examination without abnormal findings: Secondary | ICD-10-CM

## 2011-12-02 LAB — LIPID PANEL
Cholesterol: 136 mg/dL (ref 0–200)
LDL Cholesterol: 72 mg/dL (ref 0–99)
Total CHOL/HDL Ratio: 4
Triglycerides: 147 mg/dL (ref 0.0–149.0)
VLDL: 29.4 mg/dL (ref 0.0–40.0)

## 2011-12-02 LAB — HEPATIC FUNCTION PANEL
Bilirubin, Direct: 0.1 mg/dL (ref 0.0–0.3)
Total Bilirubin: 0.4 mg/dL (ref 0.3–1.2)

## 2011-12-02 LAB — CBC WITH DIFFERENTIAL/PLATELET
Basophils Relative: 0.4 % (ref 0.0–3.0)
Eosinophils Relative: 1.2 % (ref 0.0–5.0)
HCT: 45.5 % (ref 39.0–52.0)
Hemoglobin: 15.2 g/dL (ref 13.0–17.0)
Lymphs Abs: 2.2 10*3/uL (ref 0.7–4.0)
MCV: 97 fl (ref 78.0–100.0)
Monocytes Absolute: 0.7 10*3/uL (ref 0.1–1.0)
Neutro Abs: 4.1 10*3/uL (ref 1.4–7.7)
Neutrophils Relative %: 57.7 % (ref 43.0–77.0)
RBC: 4.69 Mil/uL (ref 4.22–5.81)
WBC: 7.1 10*3/uL (ref 4.5–10.5)

## 2011-12-02 LAB — BASIC METABOLIC PANEL
BUN: 20 mg/dL (ref 6–23)
Calcium: 9.3 mg/dL (ref 8.4–10.5)
Creatinine, Ser: 1.1 mg/dL (ref 0.4–1.5)
GFR: 71.92 mL/min (ref >60.00)

## 2011-12-02 LAB — TESTOSTERONE: Testosterone: 198.82 ng/dL — ABNORMAL LOW (ref 350.00–890.00)

## 2011-12-02 MED ORDER — ALBUTEROL SULFATE HFA 108 (90 BASE) MCG/ACT IN AERS: 2.0000 | INHALATION_SPRAY | Freq: Four times a day (QID) | RESPIRATORY_TRACT | Status: DC | PRN

## 2011-12-02 MED ORDER — VARDENAFIL HCL 20 MG PO TABS: 20.0000 mg | ORAL_TABLET | Freq: Every day | ORAL | Status: DC | PRN

## 2011-12-02 MED ORDER — SIMVASTATIN 20 MG PO TABS: 20.0000 mg | ORAL_TABLET | Freq: Every evening | ORAL | Status: DC

## 2011-12-02 NOTE — Progress Notes (Signed)
Subjective:     Patient ID: Jeremy Meadows, male   DOB: [birth year], 66 y.o.   MRN: [id number]  HPI 66 y/o WF here for a follow up visit... he has multiple medical problems as noted below...    ~  August 22, 2009:  he developed a kidney stone 6/10 & has been eval by [name]- s/p lithotripsy and fragment still present, last seen 10/10 on Rapaflo, may need ureteroscopy (but no pain or hematuria now)...  today c/o tinnitus & wife wants further eval so we will refer him to ENT per request... also notes ED & retrograde ejac ?from Rapaflo & he will discuss this w/ [name] in f/u visit...   ~  November 19, 2010:  He had cats found by Optometrist recently & referred to [facility] for surg.    He saw DrClance for sleep f/u 1/12> no issues w/ CPAP or mask, compliant w/ Rx, improved daytime alertness;  he has mod to severe dis & dislikes the CPAP- alternatives discussed; advised wt reduction & he wants to try oral appliance, has appt w/ DrMKatz...    He had colonoscopy DrStark 12/10> 2 polyps removed & one was a tubular adenoma & removed piecemeal therefore rec f/u 67yrs...     He saw Urology [name] for kidney stones after last visit & he wanted to do ureteroscopy by pt declined & has remained asymptomatic he says...    He noted low energy etc & labs checked by TP 12/11 showed Low-T w/ testos level = 140 (350-890);  he was started on Topical product, Androgel after prior auth done & approved but only took it for 1 month & stopped (misunderstood); we will recheck level & discuss options...    He noted elev BP in 2011 & eval by TP w/ rec for low sodium diet & home monitoring> he notes BP has been fine & 132/82 today;  Chol looks good on Simva10 but TG elev & needs better low fat diet...  ~  December 02, 2011:  Yearly ROV & he reports a good year overall; he had Fluvax 12/12 & now due to TDAP & PNEUMOVAX     He saw DrClance for sleep f/u last 2/12> stable on CPAP & wearing it 6-7H per night; he was going to  research alternative therapies eg- dental appliance but has continued the CPAP instead...    He had f/u Urology, DrDalstedt 8/12> he had another right ureteral stone ~42mm (and a 7mm stone in the right kidney) which he passed spont w/ medical expulsive therapy; f/u UA clear & he is doing satis;  He also has Low-T and never stuck to the topical therapy> repeat Testos=199 & he will commit to Rx w/ Androgel vs Axiron; requests Levitra as well- ok...    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 EKG 3/13 showed NSR, rate74, NSSTTWA, NAD... LABS 3/13 showed:  FLP- at goal on Simva20 x low HDL;  Chems- ok;  CBC- wnl;  TSH=1.28;  PSA=1.10;  Testos=199  PROBLEM LIST:    GLAUCOMA (ICD-365.9) - on LUMIGAN eye drops per Ophthalmology... ~  He had cats found by Optometrist recently & referred to [facility] for surg==> cats removed in 2012 by his report.  ALLERGIC RHINITIS (ICD-477.9) -  uses OTC antihistamines Prn... prev on Rhinocort. ~  He reports allergic to pickles now & Benedryl helps....  OBSTRUCTIVE SLEEP APNEA (ICD-327.23) - had f/u eval DrClance in 2010- optimal CPAP= 11, and using it regularly now... notes some mild snoring, but apparently doesn't disturb his wife... rests well, wakes refreshed, no daytime hypersomnolence... ~  He saw DrClance for sleep f/u 1/12> no issues w/ CPAP or mask, compliant w/ Rx, improved daytime alertness;  he has mod to severe dis & dislikes the CPAP- alternatives discussed; advised wt reduction & he wants to try oral appliance, has appt w/ DrMKatz... ~  3/13: pt tells me that he has no issues w/ his CPAP just doesn't like using it & admits to only using it intermittently now when his wife c/o his snoring; he claims good daytime  alertness & no hypersomnolence or problems that he is aware of...  ASTHMATIC BRONCHITIS, ACUTE (ICD-466.0) - he has been stable without signif URIs and no recent exac even w/ his strenuous activity & softball games... uses PROAIR Prn & SINGULAIR 10mg  Prn as well... prev on Asmanex but hasn't needed this yr. 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  Family Hx of CARDIAC DISEASE (ICD-429.9) - Father died age 40 w/ ?MI, 1 brother w/ CABG, one brother w/ stents... we discussed control of secondary risk factors, ASA, etc...  he denies CP, palpit, SOB, etc... ~  NuclearStressTest 3/08 was neg- no ischemia, no infarct, EF= 58%... ~  EKG 3/13 showed NSR, rate74, NSSTTWA, NAD...  HYPERCHOLESTEROLEMIA (ICD-272.0) - on SIMVASTATIN 20mg /d, & FISH OIL 1000mg /d... ~  FLP 3/08 showed TChol 128, TG 147, HDL 31, LDL 67 ~  FLP 11/09 showed TChol 124, TG 122, HDL 33, LDL 67 ~  FLP 11/10 showed TChol 120, TG 150, HDL 31, LDL 59 ~  FLP 2/12 showed TChol 121, TG 182, HDL 28, LDL 56... rec better low fat diet & incr exerc. ~  FLP 3/13 on Simva20 showed TChol 136, TG 147, HDL 35, LDL 72  COLONIC POLYPS (ICD-211.3) - colonoscopy 2/05 by DrStark showed 3mm polyp removed= tubular adenoma... ~  He had colonoscopy DrStark 12/10> 2 polyps removed & one was a tubular adenoma & removed piecemeal therefore rec f/u 12yrs... ~  3/13: He is due 60yr f/u colonoscopy now & we will refer to DrStark...  NEPHROLITHIASIS (ICD-592.0) - eval 6/10 by DrDahlstadt w/ Lithotripsy required... given RAPAFLO to aide passing the stone. ~  He saw Urology [name] for kidney stones after last visit & he wanted to do ureteroscopy by pt declined & has remained asymptomatic he says... ~  He had f/u DrDalstedt 8/12> another right ureteral stone ~64mm (and a 7mm stone in the right kidney) which he passed spont w/ medical expulsive therapy; f/u UA clear & he is doing satis...  TESTICULAR  HYPOFUNCTION (ICD-257.2) ~  He noted low energy & labs checked 12/11 showed Low-T w/ testos level = 140 (350-890);  he was started on Topical Androgel after prior auth done & approved but only took it for 1 month & stopped (misunderstood); we will recheck level & discuss options... ~  labs 2/12 showed Testosterone level = 240 & rec ANDROGEL vs Urology f/u, he will decide. ~  3/13: He again reports not taking the topical Androgel but notes that he felt betterwhile on it; he will commit to taking it regularly this time; Testos level = 199 & we will try to  get autorization for ANDROGEL1.62% vs AXIRON...  HEALTH MAINTENANCE:  he takes ASA 81mg /d, and Calcium/ Vitamins/ etc... ~  GI>  Followed by DrStark & due for f/u colonoscopy now (3/13)... ~  GU>  Followed by DrDahlstedt & passed kid stone 2012; Low-T as noted above & rx w/ Topical gel rx + Levitra... ~  Immunizations:  He gets the yearly Flu vaccine;  Given TDAP & PNEUMOVAX 3/13 at age 71...   Past Surgical History  Procedure Date  . Rhinoplasty 1980s    Outpatient Encounter Prescriptions as of 12/02/2011  Medication Sig Dispense Refill  . albuterol (PROVENTIL HFA;VENTOLIN HFA) 108 (90 BASE) MCG/ACT inhaler Inhale 2 puffs into the lungs every 6 (six) hours as needed.      [name] aspirin 81 MG tablet Take 81 mg by mouth daily.      . bimatoprost (LUMIGAN) 0.03 % ophthalmic solution Place 1 drop into both eyes at bedtime.      . cholecalciferol (VITAMIN D) 1000 UNITS tablet Take 1,000 Units by mouth daily.      . fish oil-omega-3 fatty acids 1000 MG capsule Take 2 g by mouth 2 (two) times daily.      . Melatonin 3 MG CAPS Take by mouth. 2 by mouth at bedtime      . Multiple Vitamin (MULTIVITAMIN) capsule Take 1 capsule by mouth daily.      . simvastatin (ZOCOR) 20 MG tablet Take 20 mg by mouth every evening.      . vardenafil (LEVITRA) 20 MG tablet Take 20 mg by mouth. As directed      . vitamin C (ASCORBIC ACID) 500 MG tablet Take 500 mg by mouth  2 (two) times daily.        No Known Allergies   Current Medications, Allergies, Past Medical History, Past Surgical History, Family History, and Social History were reviewed in [employer] record.   Review of Systems    The patient denies fever, chills, sweats, anorexia, fatigue, weakness, malaise, weight loss, sleep disorder, blurring, diplopia, eye irritation, eye discharge, vision loss, eye pain, photophobia, earache, ear discharge, tinnitus, decreased hearing, nasal congestion, nosebleeds, sore throat, hoarseness, chest pain, palpitations, syncope, dyspnea on exertion, orthopnea, PND, peripheral edema, cough, dyspnea at rest, excessive sputum, hemoptysis, wheezing, pleurisy, nausea, vomiting, diarrhea, constipation, change in bowel habits, abdominal pain, melena, hematochezia, jaundice, gas/bloating, indigestion/heartburn, dysphagia, odynophagia, dysuria, hematuria, urinary frequency, urinary hesitancy, nocturia, incontinence, back pain, joint pain, joint swelling, muscle cramps, muscle weakness, stiffness, arthritis, sciatica, restless legs, leg pain at night, leg pain with exertion, rash, itching, dryness, suspicious lesions, paralysis, paresthesias, seizures, tremors, vertigo, transient blindness, frequent falls, frequent headaches, difficulty walking, depression, anxiety, memory loss, confusion, cold intolerance, heat intolerance, polydipsia, polyphagia, polyuria, unusual weight change, abnormal bruising, bleeding, enlarged lymph nodes, urticaria, allergic rash, hay fever, and recurrent infections.     Objective:   Physical Exam    WD, WN, 66 y/o WM in NAD... GENERAL:  Alert & oriented; pleasant & cooperative. HEENT:  [place]/AT, EOM-wnl, Hx Glaucoma on gtts, EACs-clear, TMs-wnl, NOSE-clear, THROAT-clear & wnl. NECK:  Supple w/ full ROM; no JVD; normal carotid impulses w/o bruits; no thyromegaly or nodules palpated; no lymphadenopathy. CHEST:  Clear to P & A; without  wheezes/ rales/ or rhonchi. HEART:  Regular Rhythm; without murmurs/ rubs/ or gallops. ABDOMEN:  Soft & nontender; normal bowel sounds; no organomegaly or masses detected. RECTAL:  Neg - prostate 2+ & nontender w/o nodules; stool hematest neg. EXT: without deformities or  arthritic changes; no varicose veins/ venous insuffic/ or edema. NEURO:  CN's intact; motor testing normal; sensory testing normal; gait normal & balance OK. DERM:  No lesions noted; no rash etc...  RADIOLOGY DATA:  Reviewed in the EPIC EMR & discussed w/ the patient...    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    >>EKG 3/13 showed NSR, rate74, NSSTTWA, NAD...  LABORATORY DATA:  Reviewed in the EPIC EMR & discussed w/ the patient...    >>LABS 3/13 showed:  FLP- at goal on Simva20 x low HDL;  Chems- ok;  CBC- wnl;  TSH=1.28;  PSA=1.10;  Testos=199   Assessment:     OSA>  Not using CPAP in >44yr now he says, just intermittently when wife c/o his snoring; denies daytime hypersomnolence, claims good daytime alertness, & does not want to pursue CPAP or oral appliance rx etc...  AR/ AB>  He uses OTC antihist & Proair as needed; no recent exac & doing reasonably well by his hx...  CHOL>  On Simva20 & parameters are ok x sl low HDL at 35; we discussed continued med + better diet & wt reduction...  Colon polyps>  He is due for reche3ck by DrStark due to piecemeal resection of adenoma 2 yrs ago; we will refer chart to GI...  GU>  Hx nephrolithiasis & testic hypofunction>  He passed a stone on his own last yr w/ MET & doing well;  His labs indicated continued Low-T & he will commit to topical Gel terapy to assess response...  DJD>  He is seeing Ortho in [facility] for shots in his knees...     Plan:     Patient's Medications  New Prescriptions   No medications on file  Previous Medications   ASPIRIN 81 MG TABLET    Take 81 mg by mouth daily.   BIMATOPROST (LUMIGAN)  0.03 % OPHTHALMIC SOLUTION    Place 1 drop into both eyes at bedtime.   CHOLECALCIFEROL (VITAMIN D) 1000 UNITS TABLET    Take 1,000 Units by mouth daily.   FISH OIL-OMEGA-3 FATTY ACIDS 1000 MG CAPSULE    Take 2 g by mouth 2 (two) times daily.   MELATONIN 3 MG CAPS    Take by mouth. 2 by mouth at bedtime   MULTIPLE VITAMIN (MULTIVITAMIN) CAPSULE    Take 1 capsule by mouth daily.   TESTOSTERONE (AXIRON) 30 MG/ACT SOLN    Apply as directed   VITAMIN C (ASCORBIC ACID) 500 MG TABLET    Take 500 mg by mouth 2 (two) times daily.  Modified Medications   Modified Medication Previous Medication   ALBUTEROL (PROVENTIL HFA;VENTOLIN HFA) 108 (90 BASE) MCG/ACT INHALER albuterol (PROVENTIL HFA;VENTOLIN HFA) 108 (90 BASE) MCG/ACT inhaler      Inhale 2 puffs into the lungs every 6 (six) hours as needed.    Inhale 2 puffs into the lungs every 6 (six) hours as needed.   SIMVASTATIN (ZOCOR) 20 MG TABLET simvastatin (ZOCOR) 20 MG tablet      Take 1 tablet (20 mg total) by mouth every evening.    Take 20 mg by mouth every evening.   VARDENAFIL (LEVITRA) 20 MG TABLET vardenafil (LEVITRA) 20 MG tablet      Take 1 tablet (20 mg total) by mouth daily as needed for erectile dysfunction. As directed    Take 20 mg by mouth. As directed  Discontinued Medications   No medications on file

## 2011-12-02 NOTE — Patient Instructions (Signed)
Today we updated your med list in our EPIC system...    Continue your current medications the same...    We wrote a new prescription for the AXIRON to apply under each arm daily (if your insurance won't cover we will make the appropriate changes).  Today we did your follow up CXR, EKG, & fasting blood work...    Please call the PHONE TREE in a few days for your results...    Dial [id number] & when prompted enter your patient number followed by the # symbol...    Your patient number is:  [id number]#  We gave you the PNEUMONIA vaccine today (current indication- one shot after age 46)...    And the combination Tetanus vaccine called the TDAP (good for 10 yrs)...  We will contact DrStark's team regarding your needed colonoscopy>    They should call you to set it up at your convenience...  Call for any questions...  Let's continue our yearly follow up visits, but call anytime if needed for problems.Marland KitchenMarland Kitchen

## 2011-12-07 ENCOUNTER — Telehealth: Payer: Self-pay | Admitting: Pulmonary Disease

## 2011-12-07 NOTE — Telephone Encounter (Signed)
Notes Recorded by Tylene Fantasia. Reynolds, LPN on 1/61/0960 at 12:57 PM [facility] for pt TCB ------  Notes Recorded by Michele Mcalpine, MD on 12/03/2011 at 9:06 AM Labs reviewed & phone tree recorded... SMN FLP looks fair on Simva20> needs better diet, exercise, etc... Chems- ok; CBC- ok; TSH=1.28; PSA=1.10; Testos=199 & needs tria of RX w/ Androgel...   Called, spoke with pt.  Informed him of above.  Pt states he has listened to Phone Tree results on labs and cxr and doesn't have any further questions/cocnerns at this time.

## 2011-12-09 ENCOUNTER — Other Ambulatory Visit: Payer: Self-pay | Admitting: *Deleted

## 2011-12-09 MED ORDER — TESTOSTERONE 20.25 MG/1.25GM (1.62%) TD GEL: 2.0000 "application " | Freq: Every day | TRANSDERMAL | Status: DC

## 2011-12-14 ENCOUNTER — Other Ambulatory Visit: Payer: Self-pay | Admitting: Pulmonary Disease

## 2011-12-14 NOTE — Telephone Encounter (Signed)
Prime mail [place] pharmacy regulation ,needs the exact numerical quantity of a controlled substance. androgel comes in a 75g bottle. Original rx was for 5 refills...gave 255g(which is 3  Bottles of 75grams )

## 2011-12-29 ENCOUNTER — Encounter: Payer: Self-pay | Admitting: Gastroenterology

## 2011-12-29 ENCOUNTER — Ambulatory Visit (AMBULATORY_SURGERY_CENTER): Payer: BC Managed Care – PPO

## 2011-12-29 VITALS — Ht 72.0 in | Wt 200.0 lb

## 2011-12-29 DIAGNOSIS — Z860100 Personal history of colon polyps, unspecified: Secondary | ICD-10-CM

## 2011-12-29 MED ORDER — PEG-KCL-NACL-NASULF-NA ASC-C 100 G PO SOLR: 1.0000 | Freq: Once | ORAL | Status: DC

## 2012-01-12 ENCOUNTER — Encounter: Payer: Self-pay | Admitting: Gastroenterology

## 2012-01-12 ENCOUNTER — Ambulatory Visit (AMBULATORY_SURGERY_CENTER): Payer: BC Managed Care – PPO | Admitting: Gastroenterology

## 2012-01-12 VITALS — BP 133/95 | HR 81 | Temp 98.7°F | Resp 13 | Ht 72.0 in | Wt 200.0 lb

## 2012-01-12 DIAGNOSIS — Z86010 Personal history of colonic polyps: Secondary | ICD-10-CM

## 2012-01-12 DIAGNOSIS — Z1211 Encounter for screening for malignant neoplasm of colon: Secondary | ICD-10-CM

## 2012-01-12 MED ORDER — SODIUM CHLORIDE 0.9 % IV SOLN: 500.0000 mL | INTRAVENOUS | Status: DC

## 2012-01-12 NOTE — Op Note (Signed)
[facility] [address] [place], [place]  [address]  COLONOSCOPY PROCEDURE REPORT  PATIENT:  Jeremy Meadows, Jeremy Meadows  MR#:  [id number] BIRTHDATE:  1946/02/19, 66 yrs. old  GENDER:  male ENDOSCOPIST:  Judie Petit T. Russella Dar, MD, [facility]  PROCEDURE DATE:  01/12/2012 PROCEDURE:  Colonoscopy 57846 ASA CLASS:  Class II INDICATIONS:  1) surveillance and high-risk screening  2) history of pre-cancerous (adenomatous) colon polyps: 2005, 2010 MEDICATIONS:   These medications were titrated to patient response per physician's verbal order, Fentanyl 75 mcg IV, Versed 8 mg IV DESCRIPTION OF PROCEDURE:   After the risks benefits and alternatives of the procedure were thoroughly explained, informed consent was obtained.  Digital rectal exam was performed and revealed no abnormalities.   The LB 180AL [id number] endoscope was introduced through the anus and advanced to the cecum, which was identified by both the appendix and ileocecal valve, without limitations.  The quality of the prep was excellent, using MoviPrep.  The instrument was then slowly withdrawn as the colon was fully examined. <<PROCEDUREIMAGES>> FINDINGS:  A normal appearing cecum, ileocecal valve, and appendiceal orifice were identified. The ascending, hepatic flexure, transverse, splenic flexure, descending, sigmoid colon, and rectum appeared unremarkable. Retroflexed views in the rectum revealed no abnormalities. The time to cecum =  2.33  minutes. The scope was then withdrawn (time =  8.5  min) from the patient and the procedure completed.  COMPLICATIONS:  None  ENDOSCOPIC IMPRESSION: 1) Normal colon  RECOMMENDATIONS: 1) Repeat Colonoscopy in 5 years.  Venita Lick. Russella Dar, MD, Clementeen Graham  n. eSIGNED:   Venita Lick. Roseline Ebarb at 01/12/2012 03:07 PM  Maryjean Ka, [phone]

## 2012-01-12 NOTE — Patient Instructions (Signed)
YOU HAD AN ENDOSCOPIC PROCEDURE TODAY AT THE [facility]: Refer to the procedure report that was given to you for any specific questions about what was found during the examination.  If the procedure report does not answer your questions, please call your gastroenterologist to clarify.  If you requested that your care partner not be given the details of your procedure findings, then the procedure report has been included in a sealed envelope for you to review at your convenience later.  YOU SHOULD EXPECT: Some feelings of bloating in the abdomen. Passage of more gas than usual.  Walking can help get rid of the air that was put into your GI tract during the procedure and reduce the bloating. If you had a lower endoscopy (such as a colonoscopy or flexible sigmoidoscopy) you may notice spotting of blood in your stool or on the toilet paper. If you underwent a bowel prep for your procedure, then you may not have a normal bowel movement for a few days.  DIET: Your first meal following the procedure should be a light meal and then it is ok to progress to your normal diet.  A half-sandwich or bowl of soup is an example of a good first meal.  Heavy or fried foods are harder to digest and may make you feel nauseous or bloated.  Likewise meals heavy in dairy and vegetables can cause extra gas to form and this can also increase the bloating.  Drink plenty of fluids but you should avoid alcoholic beverages for 24 hours.  ACTIVITY: Your care partner should take you home directly after the procedure.  You should plan to take it easy, moving slowly for the rest of the day.  You can resume normal activity the day after the procedure however you should NOT DRIVE or use heavy machinery for 24 hours (because of the sedation medicines used during the test).    SYMPTOMS TO REPORT IMMEDIATELY: A gastroenterologist can be reached at any hour.  During normal business hours, 8:30 AM to 5:00 PM Monday through Friday,  call [phone].  After hours and on weekends, please call the GI answering service at [phone] who will take a message and have the physician on call contact you.   Following lower endoscopy (colonoscopy or flexible sigmoidoscopy):  Excessive amounts of blood in the stool  Significant tenderness or worsening of abdominal pains  Swelling of the abdomen that is new, acute  Fever of 100F or higher  Following upper endoscopy (EGD)  Vomiting of blood or coffee ground material  New chest pain or pain under the shoulder blades  Painful or persistently difficult swallowing  New shortness of breath  Fever of 100F or higher  Black, tarry-looking stools  FOLLOW UP: If any biopsies were taken you will be contacted by phone or by letter within the next 1-3 weeks.  Call your gastroenterologist if you have not heard about the biopsies in 3 weeks.  Our staff will call the home number listed on your records the next business day following your procedure to check on you and address any questions or concerns that you may have at that time regarding the information given to you following your procedure. This is a courtesy call and so if there is no answer at the home number and we have not heard from you through the emergency physician on call, we will assume that you have returned to your regular daily activities without incident.  SIGNATURES/CONFIDENTIALITY: You and/or your care   partner have signed paperwork which will be entered into your electronic medical record.  These signatures attest to the fact that that the information above on your After Visit Summary has been reviewed and is understood.  Full responsibility of the confidentiality of this discharge information lies with you and/or your care-partner.   REPEAT COLONOSCOPY IN 5 YEARS 

## 2012-01-12 NOTE — Progress Notes (Signed)
Patient did not experience any of the following events: a burn prior to discharge; a fall within the facility; wrong site/side/patient/procedure/implant event; or a hospital transfer or hospital admission upon discharge from the facility. (G8907) Patient did not have preoperative order for IV antibiotic SSI prophylaxis. (G8918)  

## 2012-01-13 ENCOUNTER — Telehealth: Payer: Self-pay

## 2012-01-13 NOTE — Telephone Encounter (Signed)
  Follow up Call-  Call back number 01/12/2012  Post procedure Call Back phone  # [phone]  Permission to leave phone message Yes     Patient questions:  Do you have a fever, pain , or abdominal swelling? no Pain Score  0 *  Have you tolerated food without any problems? yes  Have you been able to return to your normal activities? yes  Do you have any questions about your discharge instructions: Diet   no Medications  no Follow up visit  no  Do you have questions or concerns about your Care? no  Actions: * If pain score is 4 or above: No action needed, pain <4.

## 2012-05-25 ENCOUNTER — Ambulatory Visit: Payer: Self-pay | Admitting: Sports Medicine

## 2012-05-28 HISTORY — PX: KNEE SURGERY: SHX244

## 2012-06-20 ENCOUNTER — Ambulatory Visit: Payer: Self-pay | Admitting: Orthopedic Surgery

## 2012-07-04 ENCOUNTER — Ambulatory Visit (INDEPENDENT_AMBULATORY_CARE_PROVIDER_SITE_OTHER): Payer: BC Managed Care – PPO

## 2012-07-04 DIAGNOSIS — Z23 Encounter for immunization: Secondary | ICD-10-CM

## 2012-09-27 HISTORY — PX: JOINT REPLACEMENT: SHX530

## 2013-01-11 ENCOUNTER — Encounter: Payer: Self-pay | Admitting: Neurology

## 2013-01-25 ENCOUNTER — Encounter: Payer: Self-pay | Admitting: Neurology

## 2013-02-25 ENCOUNTER — Encounter: Payer: Self-pay | Admitting: Neurology

## 2013-02-27 ENCOUNTER — Other Ambulatory Visit (INDEPENDENT_AMBULATORY_CARE_PROVIDER_SITE_OTHER): Payer: Medicare Other

## 2013-02-27 ENCOUNTER — Ambulatory Visit (INDEPENDENT_AMBULATORY_CARE_PROVIDER_SITE_OTHER): Payer: Medicare Other | Admitting: Pulmonary Disease

## 2013-02-27 ENCOUNTER — Ambulatory Visit (INDEPENDENT_AMBULATORY_CARE_PROVIDER_SITE_OTHER)
Admission: RE | Admit: 2013-02-27 | Discharge: 2013-02-27 | Disposition: A | Discharge status: Home / Self Care | Payer: Medicare Other | Source: Ambulatory Visit | Attending: Pulmonary Disease | Admitting: Pulmonary Disease

## 2013-02-27 ENCOUNTER — Encounter: Payer: Self-pay | Admitting: Pulmonary Disease

## 2013-02-27 VITALS — BP 122/82 | HR 73 | Temp 99.2°F | Ht 72.0 in | Wt 197.6 lb

## 2013-02-27 DIAGNOSIS — N320 Bladder-neck obstruction: Secondary | ICD-10-CM

## 2013-02-27 DIAGNOSIS — R011 Cardiac murmur, unspecified: Secondary | ICD-10-CM

## 2013-02-27 DIAGNOSIS — M25569 Pain in unspecified knee: Secondary | ICD-10-CM | POA: Insufficient documentation

## 2013-02-27 DIAGNOSIS — E291 Testicular hypofunction: Secondary | ICD-10-CM

## 2013-02-27 DIAGNOSIS — S22000A Wedge compression fracture of unspecified thoracic vertebra, initial encounter for closed fracture: Secondary | ICD-10-CM | POA: Insufficient documentation

## 2013-02-27 DIAGNOSIS — G4733 Obstructive sleep apnea (adult) (pediatric): Secondary | ICD-10-CM

## 2013-02-27 DIAGNOSIS — E7800 Pure hypercholesterolemia, unspecified: Secondary | ICD-10-CM

## 2013-02-27 DIAGNOSIS — S22000D Wedge compression fracture of unspecified thoracic vertebra, subsequent encounter for fracture with routine healing: Secondary | ICD-10-CM

## 2013-02-27 DIAGNOSIS — N200 Calculus of kidney: Secondary | ICD-10-CM

## 2013-02-27 DIAGNOSIS — D126 Benign neoplasm of colon, unspecified: Secondary | ICD-10-CM

## 2013-02-27 DIAGNOSIS — M1990 Unspecified osteoarthritis, unspecified site: Secondary | ICD-10-CM

## 2013-02-27 DIAGNOSIS — M25561 Pain in right knee: Secondary | ICD-10-CM

## 2013-02-27 LAB — LIPID PANEL
HDL: 34.8 mg/dL — ABNORMAL LOW (ref >39.00)
LDL Cholesterol: 81 mg/dL (ref 0–99)
Total CHOL/HDL Ratio: 4
VLDL: 24.2 mg/dL (ref 0.0–40.0)

## 2013-02-27 NOTE — Patient Instructions (Addendum)
Today we updated your med list in our EPIC system...    Continue your current medications the same...  Today we did a follow up CXR, EKG & targeted blood work...    We will arrange for a 2DEchocardiogram for further evaluation of your heat murmur...    We will contact you w/ the results when available...   We discussed taking a good multivit & an extra ~1000u Vit d daily...  Call for any questions...  Let's plan a follow up visit in 68mo, sooner if needed for problems.Marland KitchenMarland Kitchen

## 2013-02-27 NOTE — Progress Notes (Addendum)
Subjective:     Patient ID: [facility], male   DOB: [birth year], 67 y.o.   MRN: [id number]  HPI 67 y/o WF here for a follow up visit... he has multiple medical problems as noted below...    ~  November 19, 2010:  He had cats found by Optometrist recently & referred to [facility] for surg.    He saw [facility] for sleep f/u 1/12> no issues w/ CPAP or mask, compliant w/ Rx, improved daytime alertness;  he has mod to severe dis & dislikes the CPAP- alternatives discussed; advised wt reduction & he wants to try oral appliance, has appt w/ Jeremy [facility]...    He had colonoscopy [facility] 12/10> 2 polyps removed & one was a tubular adenoma & removed piecemeal therefore rec f/u 65yrs...     He saw Urology [facility] for kidney stones after last visit & he wanted to do ureteroscopy by pt declined & has remained asymptomatic he says...    He noted low energy etc & labs checked by Jeremy [facility] 12/11 showed Low-T w/ testos level = 140 (350-890);  he was started on Topical product, Androgel after prior auth done & approved but only took it for 1 month & stopped (misunderstood); we will recheck level & discuss options...    He noted elev BP [facility] 2011 & eval by Jeremy [facility] w/ rec for low sodium diet & home monitoring> he notes BP has been fine & 132/82 today;  Chol looks good on Simva10 but TG elev & needs better low fat diet...  ~  December 02, 2011:  Yearly ROV & he reports a good year overall; he had Fluvax 12/12 & now due to TDAP & PNEUMOVAX     He saw Jeremy [facility] for sleep f/u last 2/12> stable on CPAP & wearing it 6-7H per night; he was going to research alternative therapies eg- dental appliance but has continued the CPAP instead...    He had f/u Urology, Jeremy [facility] 8/12> he had another right ureteral stone ~40mm (and a 7mm stone [facility] the right kidney) which he passed spont w/ medical expulsive therapy; f/u UA clear & he is doing satis;  He also has Low-T and never stuck to the topical therapy> repeat Testos=199 & he will commit to Rx w/ Androgel  vs Axiron; requests Levitra as well- ok...    He is bothered by knee pain, and still plays competitive softball [facility] several adult leagues; he is seeing an Orthopedist- [facility] at the [place] [facility] w/ bone spurs & several shots [facility] the knees... CXR 3/13 showed norm heart size, clear lungs, DJD [facility] spine w/ wedge deformities mid TSpine... EKG 3/13 showed NSR, rate74, NSSTTWA, NAD... 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 He has had considerable trouble over the past yr w/ his right knee- SEE BELOW, soon to be sched for a part knee resurfacing (Makoplasty) by the [facility]... Today's exam reveals a Jeremy Gr2-3/6 MR murmur & this evaluation is under way...  We reviewed the following medical problems during today's office visit >>     Ophthal> Hx glaucoma on gtts and s/p cat surg [facility] 2012...    OSA> hx mod OSA & optimized to CPAP11 by [facility] 2010; he later decr to just prn snoring complaints from his  wife, but he tells me he stopped it completely [facility] 2013- says he is doing fine, rests well, good daytime alertness, and no sleep issues noted...     Asthma> he is a never smoker, Hx AB [facility] past & prev on Asmanex, Singulair, but now just Proair prn; he denies cough, sput, hemoptysis, SOB, etc...    +FamHxCAD> pt's father died at 73 w/ ?MI; 1Bro w/ CABG; 1Bro w/ stents; he had Myoview 2008 which was neg- no ischemia, no infarct, EF=58%... Jeremy heart murmur heard 6/14=> eval [facility] progress...    CHOL> on Simva20, FishOil; he took OTC Niacin500 this past yr & recently ran out (tol satis); FLP 6/14 shows TChol 140, TG 121, HDL 35, LDL 81...    GI- colon polyps> hx several tubular adenomas removed- last 12/10 was removed piecemeal & repeat colon 4/13 by Jeremy [facility] was neg- f/u planned 17yrs...     GU- Kidney stones, Hypogonadism> followed  by Jeremy [facility], last stone 8/12 7 passed w/ medical therapy; known Low-T (as low as 140 [facility] 2011); he tried several topical meds but always stopped the Rx; off again & states he's feeling fine, good energy, no libido issues etc & he does not want to restart the meds...    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    DJD spine w/ wedge deformities> old CXRs back to 2007 show 2 wedge deformities [facility] mid Tspine; he denies pain & plays active adult softball league til his recent knee problems...    VitD defic> Neuro [facility] [place] found VitD=26 & treated w/ 50K x8wks then switching to VitD 1000u daily... We reviewed prob list, meds, xrays and labs> see below for updates >>  LABS 4/14 by [facility] [place] (Jeremy [facility]):  Chems- wnl x/ Na=154 Cr=1.3;  CBC- wnl;  Sed=10;  ANA= neg;  A1c=5.8;  B12=620;  SPE/IEP= wnl;  VitD=26 => treated w/ Vit D supplement... LABS 6/14:  FLP- ok on Simva20;  TSH=0.95... CXR 6/14 showed norm heart size, sl hyperinflated lungs- clear/ NAD, DJD Tspine w/ 2 wedge deformities- old, no change... EKG 6/14 showed NSR, rate74, some incr voltage & NSSTTWA... 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          PROBLEM LIST:    GLAUCOMA (ICD-365.9) - on LUMIGAN eye drops per Ophthalmology... ~  He had cats found by Optometrist recently & referred to [facility] for surg==> cats removed [facility] 2012 by his report.  ALLERGIC RHINITIS (ICD-477.9) - uses OTC antihistamines Prn... prev on Rhinocort. ~  He reports allergic to pickles now & Benedryl helps....  OBSTRUCTIVE SLEEP APNEA (ICD-327.23) -  had f/u eval [facility] 2010- optimal CPAP= 11, and using it regularly now... notes some mild snoring, but apparently doesn't disturb his wife... rests well, wakes refreshed, no daytime hypersomnolence... ~  He saw Jeremy [facility] for sleep f/u 1/12> no issues w/ CPAP or mask, compliant w/ Rx, improved daytime alertness;  he has mod to severe dis & dislikes the CPAP- alternatives discussed; advised wt reduction & he wants to try oral appliance, has  appt w/ Jeremy [facility]... ~  3/13: pt tells me that he has no issues w/ his CPAP just doesn't like using it & admits to only using it intermittently now when his wife c/o his snoring; he claims good daytime alertness & no hypersomnolence or problems that he is aware of... ~  6/14: hx mod OSA & optimized to CPAP11 by Jeremy [facility] 2010; he later decr to just prn snoring complaints from his wife, but he tells me he stopped it completely [facility] 2013- says he is doing fine, rests well, good daytime alertness, and no sleep issues noted.  ASTHMATIC BRONCHITIS, ACUTE (ICD-466.0) - he has been stable without signif URIs and no recent exac even w/ his strenuous activity & softball games... uses PROAIR Prn & SINGULAIR 10mg  Prn as well... prev on Asmanex but hasn't needed this yr. ~  CXR 3/13 showed norm heart size, clear lungs, DJD [facility] spine w/ wedge deformities mid TSpine... ~  CXR 6/14 showed norm heart size, sl hyperinflated lungs- clear/ NAD, DJD Tspine w/ 2 wedge deformities- old, no change...  Family Hx of CARDIAC DISEASE (ICD-429.9) - Father died age 53 w/ ?MI, 1 brother w/ CABG, one brother w/ stents> we discussed risk factor reduction strategy, ASA, etc... AORTIC INSUFFIC MURMUR & DILATED AORTA >> discovered w/ exam showing Jeremy cardiac murmur 6/14... ~  He denies CP, palpit, dizzy, SOB, edema, etc... ~  NuclearStressTest 3/08 was neg- no ischemia, no infarct, EF= 58%... ~  EKG 3/13 showed NSR, rate74, NSSTTWA, NAD... ~  EKG 6/14 showed NSR, rate74, some incr voltage &  NSSTTWA. ~  6/14:  Pt remains asymptomatic w/o CP, palpit, SOB, etc; but exam today revealed Gr2/6 sys murmur & Gr1/6 diastolic blow at LSB...  ~  2DEcho 6/14 showed mod calcif AoV leaflets assoc w/ mod to severe AI & mod dilated asc Ao => refer to Cards ASAP  HYPERCHOLESTEROLEMIA (ICD-272.0) - on SIMVASTATIN 20mg /d, & FISH OIL 1000mg /d... ~  FLP 3/08 showed TChol 128, TG 147, HDL 31, LDL 67 ~  FLP 11/09 showed TChol 124, TG 122, HDL 33, LDL 67 ~  FLP 11/10 showed TChol 120, TG 150, HDL 31, LDL 59 ~  FLP 2/12 showed TChol 121, TG 182, HDL 28, LDL 56... rec better low fat diet & incr exerc. ~  FLP 3/13 on Simva20 showed TChol 136, TG 147, HDL 35, LDL 72 ~  FLP 6/14 on Simva20 showed TChol 140, TG 121, HDL 35, LDL 81 (he tried Niacin for ~72yr prior to this & no ch [facility] HDL).  COLONIC POLYPS (ICD-211.3) - colonoscopy 2/05 by [facility] showed 3mm polyp removed= tubular adenoma... ~  He had colonoscopy [facility] 12/10> 2 polyps removed & one was a tubular adenoma & removed piecemeal therefore rec f/u 77yrs... ~  4/13: hx several tubular adenomas removed- last 12/10 was removed piecemeal & repeat colon 4/13 by Jeremy [facility] was neg- f/u planned 15yrs.  NEPHROLITHIASIS (ICD-592.0) - [place] [facility] [facility]/ Lithotripsy required... given RAPAFLO to aide passing the stone. ~  He saw Urology [facility] for kidney stones after last visit & he wanted to do ureteroscopy by pt declined & has remained asymptomatic he says... ~  He had f/u [facility] 8/12> another right ureteral stone ~77mm (and a 7mm stone [facility] the right kidney) which he passed spont w/ medical expulsive therapy; f/u UA clear & he is doing satis...  TESTICULAR HYPOFUNCTION (ICD-257.2) ~  He noted low energy & labs checked 12/11 showed Low-T w/ testos level =  140 (350-890);  he was started on Topical Androgel after prior auth done & approved but only took it for 1 month & stopped (misunderstood); we will recheck level & discuss options... ~  labs 2/12  showed Testosterone level = 240 & rec ANDROGEL vs Urology f/u, he will decide. ~  3/13: He again reports not taking the topical Androgel but notes that he felt betterwhile on it; he will commit to taking it regularly this time; Testos level = 199 & we will try to get autorization for ANDROGEL1.62% vs AXIRON... ~  6/14:  known Low-T (as low as 140 [facility] 2011); he tried several topical meds but always stopped the Rx; off again & states he's feeling fine, good energy, no libido issues etc & he does not want to restart the meds...  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 ~  VitD defic> Neuro [facility] [place] found VitD=26 (4/14) & treated w/ 50K x8wks then switching to VitD 1000u daily.  HEALTH MAINTENANCE:  he takes ASA 81mg /d, and Calcium/ Vitamins/ etc... ~  GI>  Followed by [facility] & due for f/u colonoscopy now (3/13)... ~  GU>  Followed by [facility] & passed kid stone 2012; Low-T as noted above & rx w/ Topical gel rx + Levitra... ~  Immunizations:  He gets the yearly Flu vaccine;  Given TDAP & PNEUMOVAX 3/13 at age 43...   Past Surgical History  Procedure Laterality Date  . Rhinoplasty  1980s  . Knee surgery Right 05/2012    Outpatient Encounter Prescriptions as of 02/27/2013  Medication Sig Dispense Refill  . albuterol (PROVENTIL HFA;VENTOLIN HFA) 108 (90 BASE) MCG/ACT inhaler Inhale 2 puffs into the lungs every 6 (six) hours as needed.  3 Inhaler  3  . aspirin 81 MG tablet  Take 81 mg by mouth daily.      . brinzolamide (AZOPT) 1 % ophthalmic suspension Place 1 drop into both eyes 2 (two) times daily.      . fish oil-omega-3 fatty acids 1000 MG capsule Take 2 g by mouth 2 (two) times daily.      . Multiple Vitamin (MULTIVITAMIN) capsule Take 1 capsule by mouth daily.      . simvastatin (ZOCOR) 20 MG tablet Take 1 tablet (20 mg total) by mouth every evening.  90 tablet  3  . vitamin C (ASCORBIC ACID) 500 MG tablet Take 500 mg by mouth 2 (two) times daily.      . cholecalciferol (VITAMIN D) 1000 UNITS tablet Take 1,000 Units by mouth daily.      . [DISCONTINUED] bimatoprost (LUMIGAN) 0.03 % ophthalmic solution Place 1 drop into both eyes at bedtime.      . [DISCONTINUED] Melatonin 3 MG CAPS Take by mouth. 2 by mouth at bedtime      . [DISCONTINUED] meloxicam (MOBIC) 15 MG tablet       . [DISCONTINUED] peg 3350 powder (MOVIPREP) SOLR Take 1 kit (100 g total)  by mouth once.  1 kit  0  . [DISCONTINUED] Testosterone (ANDROGEL) 20.25 MG/1.25GM (1.62%) GEL Place 2 application onto the skin daily. 2 pumps applied to the skin daily  1.25 g  5  . [DISCONTINUED] vardenafil (LEVITRA) 20 MG tablet Take 1 tablet (20 mg total) by mouth daily as needed for erectile dysfunction. As directed  10 tablet  5   No facility-administered encounter medications on file as of 02/27/2013.    No Known Allergies   Current Medications, Allergies, Past Medical History, Past Surgical History, Family History, and Social History were reviewed [facility] [employer] record.   Review of Systems    The patient denies fever, chills, sweats, anorexia, fatigue, weakness, malaise, weight loss, sleep disorder, blurring, diplopia, eye irritation, eye discharge, vision loss, eye pain, photophobia, earache, ear discharge, tinnitus, decreased hearing, nasal congestion, nosebleeds, sore throat, hoarseness, chest pain, palpitations, syncope, dyspnea on exertion, orthopnea, PND, peripheral edema,  cough, dyspnea at rest, excessive sputum, hemoptysis, wheezing, pleurisy, nausea, vomiting, diarrhea, constipation, change [facility] bowel habits, abdominal pain, melena, hematochezia, jaundice, gas/bloating, indigestion/heartburn, dysphagia, odynophagia, dysuria, hematuria, urinary frequency, urinary hesitancy, nocturia, incontinence, back pain, joint pain, joint swelling, muscle cramps, muscle weakness, stiffness, arthritis, sciatica, restless legs, leg pain at night, leg pain with exertion, rash, itching, dryness, suspicious lesions, paralysis, paresthesias, seizures, tremors, vertigo, transient blindness, frequent falls, frequent headaches, difficulty walking, depression, anxiety, memory loss, confusion, cold intolerance, heat intolerance, polydipsia, polyphagia, polyuria, unusual weight change, abnormal bruising, bleeding, enlarged lymph nodes, urticaria, allergic rash, hay fever, and recurrent infections.     Objective:   Physical Exam    WD, WN, 67 y/o [facility] NAD... GENERAL:  Alert & oriented; pleasant & cooperative. HEENT:  [place]/AT, EOM-wnl, Hx Glaucoma on gtts, EACs-clear, TMs-wnl, NOSE-clear, THROAT-clear & wnl. NECK:  Supple w/ full ROM; no JVD; normal carotid impulses w/o bruits; no thyromegaly or nodules palpated; no lymphadenopathy. CHEST:  Clear to P & A; without wheezes/ rales/ or rhonchi. HEART:  Regular Rhythm; Gr2/6 sys murmur & Gr1/6 diastolic blow at LSB, w/o rubs or gallops heard... ABDOMEN:  Soft & nontender; normal bowel sounds; no organomegaly or masses detected. RECTAL:  Neg - prostate 2+ & nontender w/o nodules; stool hematest neg. EXT: without deformities or arthritic changes; no varicose veins/ venous insuffic/ or edema. NEURO:  CN's intact; motor testing normal; sensory testing normal; gait normal & balance OK. DERM:  No lesions noted; no rash etc...  RADIOLOGY DATA:  Reviewed [facility] the EPIC EMR & discussed w/ the patient...    LABORATORY DATA:  Reviewed [facility] the EPIC EMR &  discussed w/ the patient...    Assessment:      OSA>  Not using CPAP [facility] >70yr now he says, just intermittently when wife c/o his snoring; denies daytime hypersomnolence, claims good daytime alertness, & does not want to pursue CPAP or oral appliance rx etc...  AR/ AB>  He uses OTC antihist & Proair as needed; no recent exac & doing reasonably well by his hx...  Heart murmur & +FamHxCAD>  Jeremy MR murmur heard 6/14 & further eval w/ 2DEcho shows mod to severe AI w/ Calcif leaflets & mod dilation of asc ao => refer to Cards ASAP...  CHOL>  On Simva20 & parameters are ok x sl low HDL at 35; we discussed continued med + better diet & wt reduction...  Colon polyps>  Follow up colon 4/13 was clear... Repeat planned for 40yrs per [facility]...  GU>  Hx nephrolithiasis &  testic hypofunction>  He passed a stone on his own last yr w/ MET & doing well;  He has Low-T as well but does not want to take meds...  DJD>  He is seeing Ortho [facility] w/ arthroscopy, mult shots, now planning Makoplasty...     Plan:     Patient's Medications  Jeremy Prescriptions   No medications on file  Previous Medications   ALBUTEROL (PROVENTIL HFA;VENTOLIN HFA) 108 (90 BASE) MCG/ACT INHALER    Inhale 2 puffs into the lungs every 6 (six) hours as needed.   ASPIRIN 81 MG TABLET    Take 81 mg by mouth daily.   BRINZOLAMIDE (AZOPT) 1 % OPHTHALMIC SUSPENSION    Place 1 drop into both eyes 2 (two) times daily.   CHOLECALCIFEROL (VITAMIN D) 1000 UNITS TABLET    Take 1,000 Units by mouth daily.   FISH OIL-OMEGA-3 FATTY ACIDS 1000 MG CAPSULE    Take 2 g by mouth 2 (two) times daily.   MULTIPLE VITAMIN (MULTIVITAMIN) CAPSULE    Take 1 capsule by mouth daily.   SIMVASTATIN (ZOCOR) 20 MG TABLET    Take 1 tablet (20 mg total) by mouth every evening.   VITAMIN C (ASCORBIC ACID) 500 MG TABLET    Take 500 mg by mouth 2 (two) times daily.  Modified Medications   No medications on file  Discontinued Medications   BIMATOPROST (LUMIGAN)  0.03 % OPHTHALMIC SOLUTION    Place 1 drop into both eyes at bedtime.   MELATONIN 3 MG CAPS    Take by mouth. 2 by mouth at bedtime   MELOXICAM (MOBIC) 15 MG TABLET       PEG 3350 POWDER (MOVIPREP) SOLR    Take 1 kit (100 g total) by mouth once.   TESTOSTERONE (ANDROGEL) 20.25 MG/1.25GM (1.62%) GEL    Place 2 application onto the skin daily. 2 pumps applied to the skin daily   VARDENAFIL (LEVITRA) 20 MG TABLET    Take 1 tablet (20 mg total) by mouth daily as needed for erectile dysfunction. As directed

## 2013-03-02 ENCOUNTER — Ambulatory Visit (HOSPITAL_COMMUNITY): Payer: Medicare Other | Attending: Cardiology | Admitting: Radiology

## 2013-03-02 DIAGNOSIS — R011 Cardiac murmur, unspecified: Secondary | ICD-10-CM

## 2013-03-02 DIAGNOSIS — G4733 Obstructive sleep apnea (adult) (pediatric): Secondary | ICD-10-CM | POA: Insufficient documentation

## 2013-03-02 NOTE — Progress Notes (Signed)
Echocardiogram performed.  

## 2013-03-05 NOTE — Progress Notes (Signed)
Echo Report Faxed to Dr. Elon Jester

## 2013-03-07 ENCOUNTER — Telehealth: Payer: Self-pay | Admitting: Pulmonary Disease

## 2013-03-07 DIAGNOSIS — R931 Abnormal findings on diagnostic imaging of heart and coronary circulation: Secondary | ICD-10-CM

## 2013-03-07 NOTE — Telephone Encounter (Signed)
Appointment was scheduled for Friday 03/09/13 at 1:30. Pt advised to arrive at 1:20. [facility] [address] [facility]rd floor. Returned patient's call and he is aware of this appointment date, time and location. [name]

## 2013-03-07 NOTE — Telephone Encounter (Signed)
Order was placed on 02/27/13. Please advise PCC's I do not see appt scheduled yet? thanks

## 2013-03-09 ENCOUNTER — Ambulatory Visit (INDEPENDENT_AMBULATORY_CARE_PROVIDER_SITE_OTHER): Payer: Medicare Other | Admitting: Cardiology

## 2013-03-09 ENCOUNTER — Encounter: Payer: Self-pay | Admitting: Cardiology

## 2013-03-09 ENCOUNTER — Other Ambulatory Visit (INDEPENDENT_AMBULATORY_CARE_PROVIDER_SITE_OTHER): Payer: Medicare Other

## 2013-03-09 VITALS — BP 132/100 | HR 82 | Ht 72.0 in | Wt 196.6 lb

## 2013-03-09 DIAGNOSIS — I359 Nonrheumatic aortic valve disorder, unspecified: Secondary | ICD-10-CM

## 2013-03-09 DIAGNOSIS — I350 Nonrheumatic aortic (valve) stenosis: Secondary | ICD-10-CM

## 2013-03-09 DIAGNOSIS — I351 Nonrheumatic aortic (valve) insufficiency: Secondary | ICD-10-CM

## 2013-03-09 LAB — BASIC METABOLIC PANEL
BUN: 22 mg/dL (ref 6–23)
Chloride: 106 mEq/L (ref 96–112)
Glucose, Bld: 73 mg/dL (ref 70–99)
Potassium: 4.2 mEq/L (ref 3.5–5.1)
Sodium: 143 mEq/L (ref 135–145)

## 2013-03-09 MED ORDER — AMLODIPINE BESYLATE 2.5 MG PO TABS: 2.5000 mg | ORAL_TABLET | Freq: Every day | ORAL | Status: DC

## 2013-03-09 NOTE — Patient Instructions (Signed)
We will schedule you for a chest CT to look at your aortic size.  Start amlodipine 2.5 mg daily for blood pressure.  You are OK to proceed with knee surgery.  I will see you in 6 months.

## 2013-03-09 NOTE — Progress Notes (Signed)
Jeremy Meadows Date of Birth: Jan 09, 1946 Medical Record #[id number]  History of Present Illness: Jeremy Meadows is seen at the request of Dr. Kriste Basque for evaluation of murmur. Jeremy Meadows is a very pleasant 67 year old white male who has been in excellent health. Jeremy Meadows states Jeremy Meadows was noted to have a murmur when Jeremy Meadows enrolled in the [name] but this has not been noted since. On more recent evaluation with his primary care Jeremy Meadows was noted to have a murmur. This led to an echocardiogram. Patient denies any history of cardiac disease. Jeremy Meadows has no symptoms of chest pain, shortness of breath, palpitations, or dizziness. 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 Jeremy Meadows is going to require a partial knee replacement. His blood pressures have been mildly elevated.  Current Outpatient Prescriptions on File Prior to Visit  Medication Sig Dispense Refill  . albuterol (PROVENTIL HFA;VENTOLIN HFA) 108 (90 BASE) MCG/ACT inhaler Inhale 2 puffs into the lungs every 6 (six) hours as needed.  3 Inhaler  3  . aspirin 81 MG tablet Take 81 mg by mouth daily.      . brinzolamide (AZOPT) 1 % ophthalmic suspension Place 1 drop into both eyes 2 (two) times daily.      . cholecalciferol (VITAMIN D) 1000 UNITS tablet Take 1,000 Units by mouth daily.      . fish oil-omega-3 fatty acids 1000 MG capsule Take 2 g by mouth 2 (two) times daily.      . Multiple Vitamin (MULTIVITAMIN) capsule Take 1 capsule by mouth daily.      . simvastatin (ZOCOR) 20 MG tablet Take 1 tablet (20 mg total) by mouth every evening.  90 tablet  3  . vitamin C (ASCORBIC ACID) 500 MG tablet Take 500 mg by mouth 2 (two) times daily.       No current facility-administered medications on file prior to visit.    No Known Allergies  Past Medical History  Diagnosis Date  . Unspecified glaucoma(365.9)   . Allergic rhinitis, cause unspecified   . Obstructive sleep apnea (adult) (pediatric)   . Acute bronchitis   . Aortic valve stenosis   . Pure  hypercholesterolemia   . Benign neoplasm of colon   . Calculus of kidney   . Other testicular hypofunction   . Aortic insufficiency   . Aortic root enlargement     Past Surgical History  Procedure Laterality Date  . Rhinoplasty  1980s  . Knee surgery Right 05/2012    arthroscopic    History  Smoking status  . Never Smoker   Smokeless tobacco  . Never Used    History  Alcohol Use: Not on file    Family History  Problem Relation Age of Onset  . Colon cancer Neg Hx   . Heart disease Brother   . Heart disease Brother   . 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 Jeremy Meadows does have a history of sleep apnea but has not used CPAP in over year. All other systems were reviewed and are negative.  Physical Exam: BP 132/100  Pulse 82  Ht 6' (1.829 m)  Wt 196 lb 9.6 oz (89.177 kg)  BMI 26.66 kg/m2 Jeremy Meadows is a pleasant white male in no acute distress. HEENT: Normocephalic, atraumatic. Pupils equal round and react to light accommodation. Sclera are clear. Oropharynx is clear with good dental repair. Neck is without JVD, adenopathy, thyromegaly, or bruits. Carotid upstrokes  are normal. Lungs: Clear Cardiovascular: Regular rate and rhythm. Normal S1 and S2. There is a grade 1-2/6 systolic ejection murmur the right upper sternal border. There is a grade 1-2/6 diastolic murmur at the left sternal border. PMI is normal. No gallop. Abdomen: Soft and nontender. No masses or bruits. No hepatosplenomegaly. Bowel sounds are positive. Extremities: No cyanosis or edema. Pulses are 2+ and symmetric. Neuro: Alert and oriented x3. Cranial nerves II through XII are intact.  LABORATORY DATA: Lab Results  Component Value Date   WBC 7.1 12/02/2011   HGB 15.2 12/02/2011   HCT 45.5 12/02/2011   PLT 182.0 12/02/2011   GLUCOSE 100* 12/02/2011   CHOL 140 02/27/2013   TRIG 121.0 02/27/2013   HDL 34.80* 02/27/2013   LDLCALC 81 02/27/2013   ALT 27  12/02/2011   AST 26 12/02/2011   NA 140 12/02/2011   K 4.3 12/02/2011   CL 104 12/02/2011   CREATININE 1.1 12/02/2011   BUN 20 12/02/2011   CO2 28 12/02/2011   TSH 1.28 12/02/2011   PSA 0.95 02/27/2013   ECG demonstrates normal sinus rhythm with voltage criteria for LVH with mild repolarization abnormality.  Echo:Echocardiography  Patient: Jeremy Meadows, Jeremy Meadows MR #: [id number] Study Date: 03/02/2013 Gender: M Age: 9 Height: 182.9cm Weight: 89.4kg BSA: 2.41m^2 Pt. Status: Room:  SONOGRAPHER Luvenia Redden, RDCS ATTENDING Suszanne Finch, [facility], [facility] PERFORMING [place], [facility] cc: Dr. Erin Sons in [place]  ------------------------------------------------------------ LV EF: 60% - 65%  ------------------------------------------------------------ Indications: Murmur 785.2.  ------------------------------------------------------------ History: PMH: Acquired from the patient and from the patient's chart. Asthmaticbronchitis. Risk factors: OSA. Family history of coronary artery disease. Dyslipidemia.  ------------------------------------------------------------ Study Conclusions  - Left ventricle: The cavity size was normal. Wall thickness was normal. Systolic function was normal. The estimated ejection fraction was in the range of 60% to 65%. - Aortic valve: Moderately calcified annulus. Moderately calcified leaflets. Moderate to severe regurgitation. - Aorta: The aorta was moderately dilated. Echocardiography. M-mode, complete 2D, spectral Doppler, and color Doppler. Height: Height: 182.9cm. Height: 72in. Weight: Weight: 89.4kg. Weight: 196.6lb. Body mass index: BMI: 26.7kg/m^2. Body surface area: BSA: 2.57m^2. Blood pressure: 122/82. Patient status: Outpatient. Location: [facility]  ------------------------------------------------------------  ------------------------------------------------------------ Left ventricle:  The cavity size was normal. Wall thickness was normal. Systolic function was normal. The estimated ejection fraction was in the range of 60% to 65%.  ------------------------------------------------------------ Aortic valve: Moderately calcified annulus. Moderately calcified leaflets. Doppler: Moderate to severe regurgitation. VTI ratio of LVOT to aortic valve: 0.26. Valve area: 1.16cm^2(VTI). Indexed valve area: 0.55cm^2/m^2 (VTI). Peak velocity ratio of LVOT to aortic valve: 0.21. Valve area: 0.94cm^2 (Vmax). Indexed valve area: 0.45cm^2/m^2 (Vmax). Mean gradient: 41mm Hg (S). Peak gradient: 63mm Hg (S).  ------------------------------------------------------------ Aorta: The aorta was moderately dilated. Ascending aorta: The ascending aorta was mildly dilated.  ------------------------------------------------------------ Mitral valve: Doppler: Peak gradient: 3mm Hg (D).  ------------------------------------------------------------ Left atrium: The atrium was normal in size.  ------------------------------------------------------------ Right ventricle: The cavity size was normal.  ------------------------------------------------------------ Pulmonic valve: Structurally normal valve. Cusp separation was normal. Doppler: Transvalvular velocity was within the normal range. Trivial regurgitation.  ------------------------------------------------------------ Right atrium: The atrium was normal in size.  ------------------------------------------------------------ Pericardium: There was no pericardial effusion.  ------------------------------------------------------------ Systemic veins: Inferior vena cava: The vessel was dilated; the respirophasic diameter changes were blunted (< 50%); findings are consistent with elevated central venous pressure.  ------------------------------------------------------------ Post procedure conclusions Ascending Aorta:  - The aorta was  moderately dilated.  ------------------------------------------------------------  2D measurements Normal Doppler measurements Normal Left ventricle Left ventricle LVID ED, 40.3 mm 43-52 Ea, lat 7.13 cm/s ------ chord, ann, tiss PLAX DP LVID ES, 26.2 mm 23-38 E/Ea, lat 11.2 ------ chord, ann, tiss 9 PLAX DP FS, chord, 35 % >29 Ea, med 5.04 cm/s ------ PLAX ann, tiss LVPW, ED 9.61 mm ------ DP IVS/LVPW 1.22 <1.3 E/Ea, med 15.9 ------ ratio, ED ann, tiss 7 Ventricular septum DP IVS, ED 11.7 mm ------ LVOT LVOT Peak vel, 82.7 cm/s ------ Diam, S 24 mm ------ S Area 4.52 cm^2 ------ VTI, S 24.2 cm ------ Aorta Aortic valve Root diam, 36 mm ------ Peak vel, 396 cm/s ------ ED S Left atrium Mean vel, 304 cm/s ------ AP dim 38 mm ------ S AP dim 1.79 cm/m^2 <2.2 VTI, S 94.3 cm ------ index Mean 41 mm Hg ------ gradient, S Peak 63 mm Hg ------ gradient, S VTI ratio 0.26 ------ LVOT/AV Area, VTI 1.16 cm^2 ------ Area index 0.55 cm^2/m ------ (VTI) ^2 Peak vel 0.21 ------ ratio, LVOT/AV Area, Vmax 0.94 cm^2 ------ Area index 0.45 cm^2/m ------ (Vmax) ^2 Mitral valve Peak E vel 80.5 cm/s ------ Peak A vel 82.9 cm/s ------ Decelerati 204 ms 150-23 on time 0 Peak 3 mm Hg ------ gradient, D Peak E/A 1 ------ ratio Systemic veins Estimated 5 mm Hg ------ CVP Right ventricle Sa vel, 11.3 cm/s ------ lat ann, tiss DP  ------------------------------------------------------------ Prepared and Electronically Authenticated by  Kristeen Miss 2014-06-06T14:14:12.883   Assessment / Plan: 1. Aortic stenosis and insufficiency. My valve gradient it appears that his stenosis is moderate in severity. By echo his insufficiency was moderate to severe. Based on his exam I think this is probably moderate in severity. Jeremy Meadows is completely asymptomatic. Left inter size and function remain normal. I have recommended initiation of amlodipine 2.5 mg daily to try and slow the progression  of his aortic insufficiency. I recommended an echocardiogram on a yearly basis. Jeremy Meadows is to contact me if Jeremy Meadows has any symptoms of chest pain, shortness of breath or dizziness. Jeremy Meadows is cleared for his planned knee surgery.  2. Aortic root dilatation. We will obtain an CT angiogram of the chest to assess the size more accurately.  3. Elevated blood pressure. Start amlodipine 2.5 mg daily. I'll followup again in 6 months.

## 2013-03-13 ENCOUNTER — Ambulatory Visit (INDEPENDENT_AMBULATORY_CARE_PROVIDER_SITE_OTHER)
Admission: RE | Admit: 2013-03-13 | Discharge: 2013-03-13 | Disposition: A | Discharge status: Home / Self Care | Payer: Medicare Other | Source: Ambulatory Visit | Attending: Cardiology | Admitting: Cardiology

## 2013-03-13 DIAGNOSIS — I351 Nonrheumatic aortic (valve) insufficiency: Secondary | ICD-10-CM

## 2013-03-13 DIAGNOSIS — I359 Nonrheumatic aortic valve disorder, unspecified: Secondary | ICD-10-CM

## 2013-03-13 DIAGNOSIS — I350 Nonrheumatic aortic (valve) stenosis: Secondary | ICD-10-CM

## 2013-03-13 MED ORDER — IOHEXOL 350 MG/ML SOLN
100.0000 mL | Freq: Once | INTRAVENOUS | Status: AC | PRN
  Administered 2013-03-13: 100 mL via INTRAVENOUS

## 2013-03-15 ENCOUNTER — Telehealth: Payer: Self-pay | Admitting: Cardiology

## 2013-03-15 NOTE — Telephone Encounter (Signed)
Pt.notified

## 2013-03-15 NOTE — Telephone Encounter (Signed)
New Problem  Pt is wanting the results of his CT scan.

## 2013-03-15 NOTE — Telephone Encounter (Signed)
Clearance faxed to [facility].

## 2013-03-15 NOTE — Telephone Encounter (Signed)
New problem    [facility] is needing cardiac clearance for pt to have sx please fax at [phone] and send any cxr or labs if available.

## 2013-03-22 ENCOUNTER — Other Ambulatory Visit: Payer: Self-pay

## 2013-03-22 DIAGNOSIS — I350 Nonrheumatic aortic (valve) stenosis: Secondary | ICD-10-CM

## 2013-03-27 ENCOUNTER — Encounter: Payer: Self-pay | Admitting: Neurology

## 2013-03-27 HISTORY — PX: MEDIAL PARTIAL KNEE REPLACEMENT: SHX5965

## 2013-03-28 ENCOUNTER — Ambulatory Visit: Payer: Self-pay | Admitting: Unknown Physician Specialty

## 2013-06-20 ENCOUNTER — Telehealth: Payer: Self-pay | Admitting: Pulmonary Disease

## 2013-06-20 NOTE — Telephone Encounter (Signed)
Spoke with pt and advised that he should keep the 6 mo rov with SN He verbalized understanding and states nothing further needed

## 2013-07-03 ENCOUNTER — Ambulatory Visit (INDEPENDENT_AMBULATORY_CARE_PROVIDER_SITE_OTHER): Payer: Medicare Other

## 2013-07-03 DIAGNOSIS — Z23 Encounter for immunization: Secondary | ICD-10-CM

## 2013-08-29 ENCOUNTER — Ambulatory Visit (INDEPENDENT_AMBULATORY_CARE_PROVIDER_SITE_OTHER): Payer: Medicare Other | Admitting: Cardiology

## 2013-08-29 ENCOUNTER — Ambulatory Visit (INDEPENDENT_AMBULATORY_CARE_PROVIDER_SITE_OTHER): Payer: Medicare Other | Admitting: Pulmonary Disease

## 2013-08-29 ENCOUNTER — Encounter: Payer: Self-pay | Admitting: Pulmonary Disease

## 2013-08-29 ENCOUNTER — Encounter: Payer: Self-pay | Admitting: Cardiology

## 2013-08-29 VITALS — BP 130/74 | HR 100 | Temp 98.2°F | Ht 72.0 in | Wt 205.6 lb

## 2013-08-29 VITALS — BP 140/96 | HR 84 | Ht 72.0 in | Wt 204.0 lb

## 2013-08-29 DIAGNOSIS — I351 Nonrheumatic aortic (valve) insufficiency: Secondary | ICD-10-CM

## 2013-08-29 DIAGNOSIS — S22000D Wedge compression fracture of unspecified thoracic vertebra, subsequent encounter for fracture with routine healing: Secondary | ICD-10-CM

## 2013-08-29 DIAGNOSIS — I350 Nonrheumatic aortic (valve) stenosis: Secondary | ICD-10-CM

## 2013-08-29 DIAGNOSIS — M1990 Unspecified osteoarthritis, unspecified site: Secondary | ICD-10-CM

## 2013-08-29 DIAGNOSIS — N200 Calculus of kidney: Secondary | ICD-10-CM

## 2013-08-29 DIAGNOSIS — E7800 Pure hypercholesterolemia, unspecified: Secondary | ICD-10-CM

## 2013-08-29 DIAGNOSIS — M25561 Pain in right knee: Secondary | ICD-10-CM

## 2013-08-29 DIAGNOSIS — I10 Essential (primary) hypertension: Secondary | ICD-10-CM

## 2013-08-29 DIAGNOSIS — I712 Thoracic aortic aneurysm, without rupture: Secondary | ICD-10-CM

## 2013-08-29 DIAGNOSIS — I359 Nonrheumatic aortic valve disorder, unspecified: Secondary | ICD-10-CM

## 2013-08-29 DIAGNOSIS — E291 Testicular hypofunction: Secondary | ICD-10-CM

## 2013-08-29 DIAGNOSIS — J309 Allergic rhinitis, unspecified: Secondary | ICD-10-CM

## 2013-08-29 DIAGNOSIS — D126 Benign neoplasm of colon, unspecified: Secondary | ICD-10-CM

## 2013-08-29 MED ORDER — ALBUTEROL SULFATE HFA 108 (90 BASE) MCG/ACT IN AERS: 2.0000 | INHALATION_SPRAY | Freq: Four times a day (QID) | RESPIRATORY_TRACT | Status: DC | PRN

## 2013-08-29 MED ORDER — AMLODIPINE BESYLATE 5 MG PO TABS: 5.0000 mg | ORAL_TABLET | Freq: Every day | ORAL | Status: DC

## 2013-08-29 NOTE — Progress Notes (Signed)
I will  TAVARIOUS FREEL Date of Birth: 11/17/1945 Medical Record #[id number]  History of Present Illness: Jeremy Meadows is seen for followup of aortic stenosis and insufficiency. He also has a thoracic aortic aneurysm measuring 5.1 cm.He has no symptoms of chest pain, shortness of breath, palpitations, or dizziness. 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 He did undergo a partial knee replacement earlier in the year. His blood pressures have been consistently elevated.  Current Outpatient Prescriptions on File Prior to Visit  Medication Sig Dispense Refill  . aspirin 81 MG tablet Take 81 mg by mouth daily.      . brinzolamide (AZOPT) 1 % ophthalmic suspension Place 1 drop into both eyes 2 (two) times daily.      . cholecalciferol (VITAMIN D) 1000 UNITS tablet Take 1,000 Units by mouth daily.      . fish oil-omega-3 fatty acids 1000 MG capsule Take 2 g by mouth 2 (two) times daily.      . Multiple Vitamin (MULTIVITAMIN) capsule Take 1 capsule by mouth daily.      . simvastatin (ZOCOR) 20 MG tablet Take 1 tablet (20 mg total) by mouth every evening.  90 tablet  3  . vitamin C (ASCORBIC ACID) 500 MG tablet Take 500 mg by mouth 2 (two) times daily.      [name] albuterol (PROVENTIL HFA;VENTOLIN HFA) 108 (90 BASE) MCG/ACT inhaler Inhale 2 puffs into the lungs every 6 (six) hours as needed.  3 Inhaler  3  . celecoxib (CELEBREX) 200 MG capsule Take 200 mg by mouth daily.       No current facility-administered medications on file prior to visit.    No Known Allergies  Past Medical History  Diagnosis Date  . Unspecified glaucoma(365.9)   . Allergic rhinitis, cause unspecified   . Obstructive sleep apnea (adult) (pediatric)   . Acute bronchitis   . Aortic valve stenosis   . Pure hypercholesterolemia   . Benign neoplasm of colon   . Calculus of kidney   . Other testicular hypofunction   . Aortic insufficiency   . Aortic root enlargement     Past Surgical History  Procedure  Laterality Date  . Rhinoplasty  1980s  . Knee surgery Right 05/2012    arthroscopic  . Medial partial knee replacement Right 03/2013    History  Smoking status  . Never Smoker   Smokeless tobacco  . Never Used    History  Alcohol Use: Not on file    Family History  Problem Relation Age of Onset  . Colon cancer Neg Hx   . Heart disease Brother   . Heart disease Brother   . Heart disease Brother     Review of Systems: As noted in history of present illness. All other systems were reviewed and are negative.  Physical Exam: BP 140/96  Pulse 84  Ht 6' (1.829 m)  Wt 204 lb (92.534 kg)  BMI 27.66 kg/m2 He is a pleasant white male in no acute distress. HEENT: Normal Neck is without JVD, adenopathy, thyromegaly, or bruits. Carotid upstrokes are normal. Lungs: Clear Cardiovascular: Regular rate and rhythm. Normal S1 and S2. There is a grade 2/6 systolic ejection murmur the right upper sternal border. There is a grade 1-2/6 diastolic murmur at the left sternal border. PMI is normal. No gallop. Abdomen: Soft and nontender. No masses or bruits. No hepatosplenomegaly. Bowel sounds are positive. Extremities: No cyanosis or edema. Pulses are 2+ and symmetric. Neuro: Alert  and oriented x3. Cranial nerves II through XII are intact.  LABORATORY DATA: Lab Results  Component Value Date   WBC 7.1 12/02/2011   HGB 15.2 12/02/2011   HCT 45.5 12/02/2011   PLT 182.0 12/02/2011   GLUCOSE 73 03/09/2013   CHOL 140 02/27/2013   TRIG 121.0 02/27/2013   HDL 34.80* 02/27/2013   LDLCALC 81 02/27/2013   ALT 27 12/02/2011   AST 26 12/02/2011   NA 143 03/09/2013   K 4.2 03/09/2013   CL 106 03/09/2013   CREATININE 1.2 03/09/2013   BUN 22 03/09/2013   CO2 27 03/09/2013   TSH 1.28 12/02/2011   PSA 0.95 02/27/2013   ECG demonstrates normal sinus rhythm with voltage criteria for LVH with mild repolarization abnormality.  Echo:Echocardiography  Patient: Meadows, Jeremy MR #: [id number] Study Date: 03/02/2013 Gender:  M Age: 67 Height: 182.9cm Weight: 89.4kg BSA: 2.28m^2 Pt. Status: Room:  SONOGRAPHER Luvenia Redden, RDCS ATTENDING Suszanne Finch, [facility], [facility] PERFORMING [place], [facility] cc: Dr. Erin Sons in [place]  ------------------------------------------------------------ LV EF: 60% - 65%  ------------------------------------------------------------ Indications: Murmur 785.2.  ------------------------------------------------------------ History: PMH: Acquired from the patient and from the patient's chart. Asthmaticbronchitis. Risk factors: OSA. Family history of coronary artery disease. Dyslipidemia.  ------------------------------------------------------------ Study Conclusions  - Left ventricle: The cavity size was normal. Wall thickness was normal. Systolic function was normal. The estimated ejection fraction was in the range of 60% to 65%. - Aortic valve: Moderately calcified annulus. Moderately calcified leaflets. Moderate to severe regurgitation. - Aorta: The aorta was moderately dilated. Echocardiography. M-mode, complete 2D, spectral Doppler, and color Doppler. Height: Height: 182.9cm. Height: 72in. Weight: Weight: 89.4kg. Weight: 196.6lb. Body mass index: BMI: 26.7kg/m^2. Body surface area: BSA: 2.62m^2. Blood pressure: 122/82. Patient status: Outpatient. Location: [place] [facility]  ------------------------------------------------------------  ------------------------------------------------------------ Left ventricle: The cavity size was normal. Wall thickness was normal. Systolic function was normal. The estimated ejection fraction was in the range of 60% to 65%.  ------------------------------------------------------------ Aortic valve: Moderately calcified annulus. Moderately calcified leaflets. Doppler: Moderate to severe regurgitation. VTI ratio of LVOT to aortic valve: 0.26. Valve area: 1.16cm^2(VTI).  Indexed valve area: 0.55cm^2/m^2 (VTI). Peak velocity ratio of LVOT to aortic valve: 0.21. Valve area: 0.94cm^2 (Vmax). Indexed valve area: 0.45cm^2/m^2 (Vmax). Mean gradient: 41mm Hg (S). Peak gradient: 63mm Hg (S).  ------------------------------------------------------------ Aorta: The aorta was moderately dilated. Ascending aorta: The ascending aorta was mildly dilated.  ------------------------------------------------------------ Mitral valve: Doppler: Peak gradient: 3mm Hg (D).  ------------------------------------------------------------ Left atrium: The atrium was normal in size.  ------------------------------------------------------------ Right ventricle: The cavity size was normal.  ------------------------------------------------------------ Pulmonic valve: Structurally normal valve. Cusp separation was normal. Doppler: Transvalvular velocity was within the normal range. Trivial regurgitation.  ------------------------------------------------------------ Right atrium: The atrium was normal in size.  ------------------------------------------------------------ Pericardium: There was no pericardial effusion.  ------------------------------------------------------------ Systemic veins: Inferior vena cava: The vessel was dilated; the respirophasic diameter changes were blunted (< 50%); findings are consistent with elevated central venous pressure.  ------------------------------------------------------------ Post procedure conclusions Ascending Aorta:  - The aorta was moderately dilated.  ------------------------------------------------------------  2D measurements Normal Doppler measurements Normal Left ventricle Left ventricle LVID ED, 40.3 mm 43-52 Ea, lat 7.13 cm/s ------ chord, ann, tiss PLAX DP LVID ES, 26.2 mm 23-38 E/Ea, lat 11.2 ------ chord, ann, tiss 9 PLAX DP FS, chord, 35 % >29 Ea, med 5.04 cm/s ------ PLAX ann, tiss LVPW, ED 9.61 mm ------  DP IVS/LVPW 1.22 <1.3 E/Ea, med 15.9 ------ ratio,  ED ann, tiss 7 Ventricular septum DP IVS, ED 11.7 mm ------ LVOT LVOT Peak vel, 82.7 cm/s ------ Diam, S 24 mm ------ S Area 4.52 cm^2 ------ VTI, S 24.2 cm ------ Aorta Aortic valve Root diam, 36 mm ------ Peak vel, 396 cm/s ------ ED S Left atrium Mean vel, 304 cm/s ------ AP dim 38 mm ------ S AP dim 1.79 cm/m^2 <2.2 VTI, S 94.3 cm ------ index Mean 41 mm Hg ------ gradient, S Peak 63 mm Hg ------ gradient, S VTI ratio 0.26 ------ LVOT/AV Area, VTI 1.16 cm^2 ------ Area index 0.55 cm^2/m ------ (VTI) ^2 Peak vel 0.21 ------ ratio, LVOT/AV Area, Vmax 0.94 cm^2 ------ Area index 0.45 cm^2/m ------ (Vmax) ^2 Mitral valve Peak E vel 80.5 cm/s ------ Peak A vel 82.9 cm/s ------ Decelerati 204 ms 150-23 on time 0 Peak 3 mm Hg ------ gradient, D Peak E/A 1 ------ ratio Systemic veins Estimated 5 mm Hg ------ CVP Right ventricle Sa vel, 11.3 cm/s ------ lat ann, tiss DP  ------------------------------------------------------------ Prepared and Electronically Authenticated by  Kristeen Miss 2014-06-06T14:14:12.883  CT ANGIOGRAPHY CHEST  Technique: Multidetector CT imaging of the chest using the standard protocol during bolus administration of intravenous contrast. Multiplanar reconstructed images including MIPs were obtained and reviewed to evaluate the vascular anatomy.  Contrast: OMNIPAQUE IOHEXOL 350 MG/ML SOLN  Comparison: Radiographs 02/27/2013 and earlier.  Findings: Heavy calcification at the aortic valve. Aortic diameter 3.8 cm at the sinotubular junction, 5.1 cm proximal ascending, 4.8 cm distal ascending/proximal arch, 2.9 cm distal arch/proximal descending, 2.7 cm distal descending at the diaphragm. No dissection. Classic three-vessel brachiocephalic arterial origin anatomy without proximal stenosis. Pulmonary arterial circulation unremarkable centrally; exam was not optimized for  detection of pulmonary emboli. No pleural or pericardial effusion. Sub centimeter prevascular, AP window, and right paratracheal lymph nodes. No hilar adenopathy. Minimal dependent atelectasis posteriorly in both lower lobes. Lungs are otherwise clear. Mild vertebral compression fracture deformities of T8, T9, and T10, not grossly changed compared to the previous lateral chest radiograph of 11/19/2010.  IMPRESSION:  1. 5.1 cm ascending aortic aneurysm without complicating features. 2. Heavy aortic valve calcifications. 3. T8, T9, and T10 compression fracture deformities, probably chronic.   Original Report Authenticated By: D. Andria Rhein, MD        Last Resulted: 03/13/13 9:43 AM    Assessment / Plan: 1. Aortic stenosis and insufficiency. My valve gradient it appears that his stenosis is moderate in severity. By echo his insufficiency was moderate to severe. Based on his exam I think this is probably moderate in severity. He is completely asymptomatic. Left ventricular size and function remain normal. I recommended an echocardiogram on a yearly basis. I will followup in 6 months and we will check an echocardiogram around that time. He is to contact me if he has any symptoms of chest pain, shortness of breath or dizziness.   2. Thoracic aortic aneurysm. This measures 5.1 cm. We'll recommend yearly followup.  3. Hypertension-I recommend increasing his amlodipine to 5 mg daily.

## 2013-08-29 NOTE — Patient Instructions (Signed)
Today we updated your med list in our EPIC system...    Continue your current medications the same...  Keep up the good work w/ diet & exercise, watch your weight...  Call for any questions...  Let's plan a follow up visit in 77mo, sooner if needed for problems.Marland KitchenMarland Kitchen

## 2013-08-29 NOTE — Patient Instructions (Signed)
Increase amlodipine to 5 mg daily.  I will see you in 6 months.

## 2013-09-02 ENCOUNTER — Encounter: Payer: Self-pay | Admitting: Pulmonary Disease

## 2013-09-02 NOTE — Progress Notes (Addendum)
Subjective:     Patient ID: Jeremy Meadows, male   DOB: Feb 16, 1946, 67 y.o.   MRN: [id number]  HPI 66 y/o WF here for a follow up visit... he has multiple medical problems as noted below...    ~  December 02, 2011:  Yearly ROV & he reports a good year overall; he had Fluvax 12/12 & now due to TDAP & PNEUMOVAX     He saw DrClance for sleep f/u last 2/12> stable on CPAP & wearing it 6-7H per night; he was going to research alternative therapies eg- dental appliance but has continued the CPAP instead...    He had f/u Urology, DrDalstedt 8/12> he had another right ureteral stone ~10mm (and a 7mm stone in the right kidney) which he passed spont w/ medical expulsive therapy; f/u UA clear & he is doing satis;  He also has Low-T and never stuck to the topical therapy> repeat Testos=199 & he will commit to Rx w/ Androgel vs Axiron; requests Levitra as well- ok...    He is bothered by knee pain, and still plays competitive softball in several adult leagues; he is seeing an Orthopedist- DrBarnes [facility] w/ bone spurs & several shots in the knees... 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 EKG 3/13 showed NSR, rate74, NSSTTWA, NAD... 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 He has had considerable trouble over the past yr w/ his right knee- SEE BELOW, soon to be sched for a part knee resurfacing (Makoplasty) by the Orthopedists at [place]... Today's exam reveals a new Gr2-3/6 MR murmur & this evaluation is under way...  We reviewed the following medical problems during today's office visit >>     Ophthal> Hx glaucoma on gtts and s/p cat surg in 2012...    OSA> hx mod OSA & optimized to CPAP11 by DrClance in 2010; he later decr to just prn snoring complaints from his wife, but  he tells me he stopped it completely in 2013- says he is doing fine, rests well, good daytime alertness, and no sleep issues noted...     Asthma> he is a never smoker, Hx AB in past & prev on Asmanex, Singulair, but now just Proair prn; he denies cough, sput, hemoptysis, SOB, etc...    +FamHxCAD> pt's father died at 74 w/ ?MI; 1Bro w/ CABG; 1Bro w/ stents; he had Myoview 2008 which was neg- no ischemia, no infarct, EF=58%... New heart murmur heard 6/14=> eval in progress...    CHOL> on Simva20, FishOil; he took OTC Niacin500 this past yr & recently ran out (tol satis); FLP 6/14 shows TChol 140, TG 121, HDL 35, LDL 81...    GI- colon polyps> hx several tubular adenomas removed- last 12/10 was removed piecemeal & repeat colon 4/13 by DrStark was neg- f/u planned 41yrs...     GU- Kidney stones, Hypogonadism> followed by DrDahlstedt, last stone 8/12 7 passed w/ medical therapy; known Low-T (as low as 140 in 2011); he tried several topical meds but always stopped the Rx; off again & states he's feeling fine, good energy, no libido issues etc & he does not want to restart the meds...    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    DJD spine w/ wedge deformities> old CXRs back to 2007 show 2 wedge deformities in mid Tspine; he denies pain & plays active adult softball league til his recent knee problems...    VitD defic> Neuro in [place] found VitD=26 & treated w/ 50K x8wks then switching to VitD 1000u daily... We reviewed prob list, meds, xrays and labs> see below for updates >>  LABS 4/14 by DrShah in [place] (Neurology):  Chems- wnl x/ Na=154 Cr=1.3;  CBC- wnl;  Sed=10;  ANA=  neg;  A1c=5.8;  B12=620;  SPE/IEP= wnl;  VitD=26 => treated w/ Vit D supplement... LABS 6/14:  FLP- ok on Simva20;  TSH=0.95... CXR 6/14 showed norm heart size, sl hyperinflated lungs- clear/ NAD, DJD Tspine w/ 2 wedge deformities- old, no change... EKG 6/14 showed NSR, rate74, some incr voltage & NSSTTWA... 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  Jeremy Meadows's CC is arthritis which limits his mobility;  He had partial knee replacement 7/14 by DrKernodle in [place]- finished PT 7 improved he says...   He has gained 9# up to 206# & we reviewed diet, exercise, wt reduction strategies;  He remains on Simva20, Celebrex200 prn...   We reviewed prob list, meds, xrays and labs> see below for updates >>           PROBLEM LIST:    GLAUCOMA (ICD-365.9) - on LUMIGAN eye drops per Ophthalmology... ~  He had cats found by Optometrist recently & referred to [facility] for surg==> cats removed in 2012 by his report.  ALLERGIC RHINITIS (ICD-477.9) - uses OTC antihistamines Prn... prev on Rhinocort. ~  He reports allergic to pickles now & Benedryl helps....  OBSTRUCTIVE SLEEP APNEA (ICD-327.23) - had f/u eval DrClance in 2010- optimal CPAP= 11, and using it regularly now... notes some mild snoring, but apparently doesn't disturb his wife... rests well, wakes refreshed, no daytime hypersomnolence... ~  He saw DrClance for sleep f/u 1/12> no issues w/ CPAP or mask, compliant w/ Rx, improved daytime alertness;  he has mod to severe dis & dislikes the CPAP- alternatives discussed; advised wt reduction & he wants to try oral appliance, has appt w/ DrMKatz... ~   3/13: pt tells me that he has no issues w/ his CPAP just doesn't like using it & admits to only using it intermittently now when his wife c/o his snoring; he claims good daytime alertness & no hypersomnolence or problems that he is aware of... ~  6/14: hx mod OSA & optimized to CPAP11 by DrClance in 2010; he later decr to just prn snoring complaints from his wife, but he tells me he stopped it completely in 2013- says he is doing fine, rests well, good daytime alertness, and no sleep issues noted.  ASTHMATIC BRONCHITIS, ACUTE (ICD-466.0) - he has been stable without signif URIs and no recent exac even w/ his strenuous activity & softball games... uses PROAIR Prn & SINGULAIR 10mg  Prn as well... prev on Asmanex but hasn't needed this yr. 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 ~  CXR 6/14 showed norm heart size, sl hyperinflated lungs- clear/ NAD, DJD Tspine w/ 2 wedge deformities- old, no change...  Family Hx of CARDIAC DISEASE (ICD-429.9) - Father died age 6 w/ ?MI, 1 brother w/ CABG, one brother w/ stents> we discussed risk factor reduction strategy, ASA, etc... AORTIC INSUFFIC MURMUR & DILATED AORTA >> discovered w/ exam showing new cardiac murmur 6/14... ~  He denies CP, palpit, dizzy, SOB, edema, etc... ~  NuclearStressTest 3/08 was neg- no ischemia, no infarct, EF= 58%... ~  EKG 3/13 showed NSR, rate74, NSSTTWA, NAD... ~  EKG 6/14 showed NSR, rate74, some incr voltage & NSSTTWA. ~  6/14:  Pt remains asymptomatic w/o CP, palpit, SOB, etc; but exam today revealed Gr2/6 sys murmur & Gr1/6 diastolic blow at LSB...  ~  2DEcho 6/14 showed mod calcif AoV leaflets assoc w/ mod to severe AI & mod dilated asc Ao => refer to Cards ASAP ~  Pt now followed by DrPJordan for AS/ AI/ Mabeline Caras Ao aneurysm measuring 5.1cm...   HYPERCHOLESTEROLEMIA (ICD-272.0) - on SIMVASTATIN 20mg /d, & FISH OIL 1000mg /d... ~  FLP 3/08 showed TChol 128, TG 147, HDL 31, LDL  67 ~  FLP 11/09 showed TChol 124, TG 122, HDL 33, LDL 67 ~  FLP 11/10 showed TChol 120, TG 150, HDL 31, LDL 59 ~  FLP 2/12 showed TChol 121, TG 182, HDL 28, LDL 56... rec better low fat diet & incr exerc. ~  FLP 3/13 on Simva20 showed TChol 136, TG 147, HDL 35, LDL 72 ~  FLP 6/14 on Simva20 showed TChol 140, TG 121, HDL 35, LDL 81 (he tried Niacin for ~76yr prior to this & no ch in HDL).  COLONIC POLYPS (ICD-211.3) - colonoscopy 2/05 by DrStark showed 3mm polyp removed= tubular adenoma... ~  He had colonoscopy DrStark 12/10> 2 polyps removed & one was a tubular adenoma & removed piecemeal therefore rec f/u 73yrs... ~  4/13: hx several tubular adenomas removed- last 12/10 was removed piecemeal & repeat colon 4/13 by DrStark was neg- f/u planned 67yrs.  NEPHROLITHIASIS (ICD-592.0) - eval 6/10 by DrDahlstadt w/ Lithotripsy required... given RAPAFLO to aide passing the stone. ~  He saw Urology [name] for kidney stones after last visit & he wanted to do ureteroscopy by pt declined & has remained asymptomatic he says... ~  He had f/u DrDalstedt 8/12> another right ureteral stone ~80mm (and a 7mm stone in the right kidney) which he passed spont w/ medical expulsive therapy; f/u UA clear & he is doing satis...  TESTICULAR HYPOFUNCTION (ICD-257.2) ~  He noted low energy & labs checked 12/11 showed Low-T w/ testos level = 140 (350-890);  he was started on Topical Androgel after prior auth done & approved but only took it for 1 month & stopped (misunderstood); we will recheck level & discuss options... ~  labs 2/12 showed Testosterone level = 240 & rec ANDROGEL vs Urology f/u, he will decide. ~  3/13: He again reports not taking the topical Androgel but notes that he felt betterwhile on it; he will commit to taking it regularly this time; Testos level = 199 & we will try  to get autorization for ANDROGEL1.62% vs AXIRON... ~  6/14:  known Low-T (as low as 140 in 2011); he tried several topical meds but always  stopped the Rx; off again & states he's feeling fine, good energy, no libido issues etc & he does not want to restart the meds...  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 ~  VitD defic> Neuro in [place] found VitD=26 (4/14) & treated w/ 50K x8wks then switching to VitD 1000u daily.  HEALTH MAINTENANCE:  he takes ASA 81mg /d, and Calcium/ Vitamins/ etc... ~  GI>  Followed by DrStark & due for f/u colonoscopy now (3/13)... ~  GU>  Followed by DrDahlstedt & passed kid stone 2012; Low-T as noted above & rx w/ Topical gel rx + Levitra... ~  Immunizations:  He gets the yearly Flu vaccine;  Given TDAP & PNEUMOVAX 3/13 at age 41...   Past Surgical History  Procedure Laterality Date  . Rhinoplasty  1980s  . Knee surgery Right 05/2012    arthroscopic  . Medial partial knee replacement Right 03/2013    Outpatient Encounter Prescriptions as of 08/29/2013  Medication Sig  . albuterol (PROVENTIL HFA;VENTOLIN HFA) 108 (90 BASE) MCG/ACT inhaler Inhale 2 puffs into the lungs every 6 (six) hours as needed.  [name] amLODipine (NORVASC) 5 MG tablet Take 1 tablet (5 mg total) by mouth daily.  [name] aspirin 81 MG tablet Take 81 mg by mouth daily.  . brinzolamide (AZOPT) 1 % ophthalmic suspension Place 1 drop into both eyes 2 (two) times daily.  . celecoxib (CELEBREX) 200 MG capsule Take 200 mg by mouth daily.  . cholecalciferol (VITAMIN D) 1000 UNITS tablet Take 1,000 Units by mouth daily.  . fish  oil-omega-3 fatty acids 1000 MG capsule Take 2 g by mouth 2 (two) times daily.  . Multiple Vitamin (MULTIVITAMIN) capsule Take 1 capsule by mouth daily.  . simvastatin (ZOCOR) 20 MG tablet Take 1 tablet (20 mg total) by mouth every evening.  . vitamin C (ASCORBIC ACID) 500 MG tablet Take 500 mg by mouth 2 (two) times daily.  . [DISCONTINUED] albuterol (PROVENTIL HFA;VENTOLIN HFA) 108 (90 BASE) MCG/ACT inhaler Inhale 2 puffs into the lungs every 6 (six) hours as needed.    No Known Allergies   Current Medications, Allergies, Past Medical History, Past Surgical History, Family History, and Social History were reviewed in [employer] record.   Review of Systems    The patient denies fever, chills, sweats, anorexia, fatigue, weakness, malaise, weight loss, sleep disorder, blurring, diplopia, eye irritation, eye discharge, vision loss, eye pain, photophobia, earache, ear discharge, tinnitus, decreased hearing, nasal congestion, nosebleeds, sore throat, hoarseness, chest pain, palpitations, syncope, dyspnea on exertion, orthopnea, PND, peripheral edema, cough, dyspnea at rest, excessive sputum, hemoptysis, wheezing, pleurisy, nausea, vomiting, diarrhea, constipation, change in bowel habits, abdominal pain, melena, hematochezia, jaundice, gas/bloating, indigestion/heartburn, dysphagia, odynophagia, dysuria, hematuria, urinary frequency, urinary hesitancy, nocturia, incontinence, back pain, joint pain,  joint swelling, muscle cramps, muscle weakness, stiffness, arthritis, sciatica, restless legs, leg pain at night, leg pain with exertion, rash, itching, dryness, suspicious lesions, paralysis, paresthesias, seizures, tremors, vertigo, transient blindness, frequent falls, frequent headaches, difficulty walking, depression, anxiety, memory loss, confusion, cold intolerance, heat intolerance, polydipsia, polyphagia, polyuria, unusual weight change, abnormal bruising, bleeding, enlarged lymph  nodes, urticaria, allergic rash, hay fever, and recurrent infections.     Objective:   Physical Exam    WD, WN, 67 y/o WM in NAD... GENERAL:  Alert & oriented; pleasant & cooperative. HEENT:  [place]/AT, EOM-wnl, Hx Glaucoma on gtts, EACs-clear, TMs-wnl, NOSE-clear, THROAT-clear & wnl. NECK:  Supple w/ full ROM; no JVD; normal carotid impulses w/o bruits; no thyromegaly or nodules palpated; no lymphadenopathy. CHEST:  Clear to P & A; without wheezes/ rales/ or rhonchi. HEART:  Regular Rhythm; Gr2/6 sys murmur & Gr1/6 diastolic blow at LSB, w/o rubs or gallops heard... ABDOMEN:  Soft & nontender; normal bowel sounds; no organomegaly or masses detected. RECTAL:  Neg - prostate 2+ & nontender w/o nodules; stool hematest neg. EXT: without deformities or arthritic changes; no varicose veins/ venous insuffic/ or edema. NEURO:  CN's intact; motor testing normal; sensory testing normal; gait normal & balance OK. DERM:  No lesions noted; no rash etc...  RADIOLOGY DATA:  Reviewed in the EPIC EMR & discussed w/ the patient...    LABORATORY DATA:  Reviewed in the EPIC EMR & discussed w/ the patient...    Assessment:      OSA>  Not using CPAP in >67yr now he says, just intermittently when wife c/o his snoring; denies daytime hypersomnolence, claims good daytime alertness, & does not want to pursue CPAP or oral appliance rx etc...  AR/ AB>  He uses OTC antihist & Proair as needed; no recent exac & doing reasonably well by his hx...  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  CHOL>  On Simva20 & parameters are ok x sl low HDL at 35; we discussed continued med + better diet & wt reduction...  Colon polyps>  Follow up colon 4/13 was clear... Repeat planned for 27yrs per DrStark...  GU>  Hx nephrolithiasis & testic hypofunction>  He passed a stone on his own last yr w/ MET & doing well;  He has Low-T as well but does not want to take meds...  DJD>  He is  seeing Ortho in [place] (DrKernodle) w/ arthroscopy, mult shots, & Makoplasty 7/14...     Plan:     Patient's Medications  New Prescriptions   No medications on file  Previous Medications   AMLODIPINE (NORVASC) 5 MG TABLET    Take 1 tablet (5 mg total) by mouth daily.   ASPIRIN 81 MG TABLET    Take 81 mg by mouth daily.   BRINZOLAMIDE (AZOPT) 1 % OPHTHALMIC SUSPENSION    Place 1 drop into both eyes 2 (two) times daily.   CELECOXIB (CELEBREX) 200 MG CAPSULE    Take 200 mg by mouth daily.   CHOLECALCIFEROL (VITAMIN D) 1000 UNITS TABLET    Take 1,000 Units by mouth daily.   FISH OIL-OMEGA-3 FATTY ACIDS 1000 MG CAPSULE    Take 2 g by mouth 2 (two) times daily.   MULTIPLE VITAMIN (MULTIVITAMIN) CAPSULE    Take 1 capsule by mouth daily.   SIMVASTATIN (ZOCOR) 20 MG TABLET    Take 1 tablet (20 mg total) by mouth every evening.   VITAMIN C (ASCORBIC  ACID) 500 MG TABLET    Take 500 mg by mouth 2 (two) times daily.  Modified Medications   Modified Medication Previous Medication   ALBUTEROL (PROVENTIL HFA;VENTOLIN HFA) 108 (90 BASE) MCG/ACT INHALER albuterol (PROVENTIL HFA;VENTOLIN HFA) 108 (90 BASE) MCG/ACT inhaler      Inhale 2 puffs into the lungs every 6 (six) hours as needed.    Inhale 2 puffs into the lungs every 6 (six) hours as needed.  Discontinued Medications   No medications on file

## 2013-09-27 DIAGNOSIS — R911 Solitary pulmonary nodule: Secondary | ICD-10-CM

## 2013-09-27 DIAGNOSIS — I350 Nonrheumatic aortic (valve) stenosis: Secondary | ICD-10-CM

## 2013-09-27 HISTORY — PX: LUNG BIOPSY: SHX232

## 2013-09-27 HISTORY — DX: Solitary pulmonary nodule: R91.1

## 2013-09-27 HISTORY — DX: Nonrheumatic aortic (valve) stenosis: I35.0

## 2013-11-06 ENCOUNTER — Encounter: Payer: Self-pay | Admitting: Adult Health

## 2013-11-06 ENCOUNTER — Other Ambulatory Visit (INDEPENDENT_AMBULATORY_CARE_PROVIDER_SITE_OTHER): Payer: Medicare HMO

## 2013-11-06 ENCOUNTER — Ambulatory Visit (INDEPENDENT_AMBULATORY_CARE_PROVIDER_SITE_OTHER): Payer: Medicare HMO | Admitting: Adult Health

## 2013-11-06 VITALS — BP 124/74 | HR 88 | Temp 98.6°F | Ht 72.0 in | Wt 208.6 lb

## 2013-11-06 DIAGNOSIS — R011 Cardiac murmur, unspecified: Secondary | ICD-10-CM

## 2013-11-06 DIAGNOSIS — R310 Gross hematuria: Secondary | ICD-10-CM

## 2013-11-06 DIAGNOSIS — I10 Essential (primary) hypertension: Secondary | ICD-10-CM

## 2013-11-06 DIAGNOSIS — R319 Hematuria, unspecified: Secondary | ICD-10-CM

## 2013-11-06 DIAGNOSIS — M25569 Pain in unspecified knee: Secondary | ICD-10-CM

## 2013-11-06 LAB — URINALYSIS, ROUTINE W REFLEX MICROSCOPIC
Bilirubin Urine: NEGATIVE
Ketones, ur: NEGATIVE
LEUKOCYTES UA: NEGATIVE
NITRITE: NEGATIVE
PH: 5.5 (ref 5.0–8.0)
Total Protein, Urine: NEGATIVE
UROBILINOGEN UA: 0.2 (ref 0.0–1.0)
Urine Glucose: NEGATIVE
WBC UA: NONE SEEN (ref 0–?)

## 2013-11-06 NOTE — Progress Notes (Signed)
Subjective:     Patient ID: Jeremy Meadows, male   DOB: January 13, 1946, 68 y.o.   MRN: [id number]  HPI 68 y/o WF .[name] he has multiple medical problems as noted below...    ~  December 02, 2011:  Yearly ROV & he reports a good year overall; he had Fluvax 12/12 & now due to Twining     He saw DrClance for sleep f/u last 2/12> stable on CPAP & wearing it 6-7H per night; he was going to research alternative therapies eg- dental appliance but has continued the CPAP instead...    He had f/u Urology, DrDalstedt 8/12> he had another right ureteral stone ~4mm (and a 43mm stone in the right kidney) which he passed spont w/ medical expulsive therapy; f/u UA clear & he is doing satis;  He also has Low-T and never stuck to the topical therapy> repeat Testos=199 & he will commit to Rx w/ Androgel vs Axiron; requests Levitra as well- ok...    He is bothered by knee pain, and still plays competitive softball in several adult leagues; he is seeing an [place] [facility]/ bone spurs & several shots in the knees... 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 EKG 3/13 showed NSR, rate74, NSSTTWA, NAD... 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 He has had considerable trouble over the past yr w/ his right knee- SEE BELOW, soon to be sched for a part knee resurfacing (Makoplasty) by the Orthopedists at [place]... Today's exam reveals a new Gr2-3/6 MR murmur & this evaluation is under way...  We reviewed the following medical problems during today's office visit >>     Ophthal> Hx glaucoma on gtts and s/p cat surg in 2012...    OSA> hx mod OSA & optimized to CPAP11 by DrClance in 2010; he later decr to just prn snoring complaints from his wife, but he tells me he stopped it  completely in 2013- says he is doing fine, rests well, good daytime alertness, and no sleep issues noted...     Asthma> he is a never smoker, Hx AB in past & prev on Asmanex, Singulair, but now just Proair prn; he denies cough, sput, hemoptysis, SOB, etc...    +FamHxCAD> pt's father died at 44 w/ ?MI; 1Bro w/ CABG; 1Bro w/ stents; he had Myoview 2008 which was neg- no ischemia, no infarct, EF=58%... New heart murmur heard 6/14=> eval in progress...    CHOL> on Simva20, FishOil; he took OTC Niacin500 this past yr & recently ran out (tol satis); FLP 6/14 shows TChol 140, TG 121, HDL 35, LDL 81...    GI- colon polyps> hx several tubular adenomas removed- last 12/10 was removed piecemeal & repeat colon 4/13 by DrStark was neg- f/u planned 65yrs...     GU- Kidney stones, Hypogonadism> followed by DrDahlstedt, last stone 8/12 7 passed w/ medical therapy; known Low-T (as low as 140 in 2011); he tried several topical meds but always stopped the Rx; off again & states he's feeling fine, good energy, no libido issues etc & he does not want to restart the meds...    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    DJD spine w/ wedge deformities> old CXRs back to 2007 show 2 wedge deformities in mid Tspine; he denies pain & plays active adult softball league til his recent knee problems...    VitD defic> Neuro in [place] found VitD=26 & treated w/ 50K x8wks then switching to VitD 1000u daily... We reviewed prob list, meds, xrays and labs> see below for updates >>  LABS 4/14 by [facility] in [place] (Neurology):  Chems- wnl x/ Na=154 Cr=1.3;  CBC- wnl;  Sed=10;  ANA= neg;  A1c=5.8;  B12=620;   SPE/IEP= wnl;  VitD=26 => treated w/ Vit D supplement... LABS 6/14:  FLP- ok on Simva20;  TSH=0.95... CXR 6/14 showed norm heart size, sl hyperinflated lungs- clear/ NAD, DJD Tspine w/ 2 wedge deformities- old, no change... EKG 6/14 showed NSR, rate74, some incr voltage & NSSTTWA... 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  Jeremy Meadows's CC is arthritis which limits his mobility;  He had partial knee replacement 7/14 by DrKernodle in [place]- finished PT 7 improved he says...   He has gained 9# up to 206# & we reviewed diet, exercise, wt reduction strategies;  He remains on Simva20, Celebrex200 prn...     We reviewed prob list, meds, xrays and labs> see below for updates >>    11/06/2013 AcuteOV  Presents for hematuria x3 episodes on  2/7 and 2/8 .  denies any pain, burning., dysuria, dribbling, abdominal pain, n/v/d, bloody stools,  No previous episodes.  Not on blood thinner. Does take ASA 81mg  daily  Says he moved furniture 3 days ago, Lifted several pieces of heavy furniture up and down steps. Next morning noticed that his urine was dark brown. No blood clots. No urinary urgency or press. Had 2 other episodes w/ small specks of blood mixed with yellow urine.  No blood last 2 days.  Has had kidney stones in past but had no pain in back or abd .           PROBLEM LIST:    GLAUCOMA (ICD-365.9) - on LUMIGAN eye drops per Ophthalmology... ~  He had cats found by Optometrist recently & referred to [facility] for surg==> cats removed in 2012 by his report.  ALLERGIC RHINITIS (ICD-477.9) - uses OTC antihistamines Prn... prev on Rhinocort. ~   He reports allergic to pickles now & Benedryl helps....  OBSTRUCTIVE SLEEP APNEA (ICD-327.23) - had f/u eval DrClance in 2010- optimal CPAP= 11, and using it regularly now... notes some mild snoring, but apparently doesn't disturb his wife... rests well, wakes refreshed, no daytime hypersomnolence... ~  He saw DrClance for sleep f/u 1/12> no issues w/ CPAP or mask, compliant w/ Rx, improved daytime alertness;  he has mod to severe dis & dislikes the CPAP- alternatives discussed; advised wt reduction &  he wants to try oral appliance, has appt w/ DrMKatz... ~  3/13: pt tells me that he has no issues w/ his CPAP just doesn't like using it & admits to only using it intermittently now when his wife c/o his snoring; he claims good daytime alertness & no hypersomnolence or problems that he is aware of... ~  6/14: hx mod OSA & optimized to CPAP11 by DrClance in 2010; he later decr to just prn snoring complaints from his wife, but he tells me he stopped it completely in 2013- says he is doing fine, rests well, good daytime alertness, and no sleep issues noted.  ASTHMATIC BRONCHITIS, ACUTE (ICD-466.0) - he has been stable without signif URIs and no recent exac even w/ his strenuous activity & softball games... uses PROAIR Prn & SINGULAIR 10mg  Prn as well... prev on Asmanex but hasn't needed this yr. 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 ~  CXR 6/14 showed norm heart size, sl hyperinflated lungs- clear/ NAD, DJD Tspine w/ 2 wedge deformities- old, no change...  Family Hx of CARDIAC DISEASE (ICD-429.9) - Father died age 36 w/ ?MI, 1 brother w/ CABG, one brother w/ stents> we discussed risk factor reduction strategy, ASA, etc... AORTIC INSUFFIC MURMUR & DILATED AORTA >> discovered w/ exam showing new cardiac murmur 6/14... ~  He denies CP, palpit, dizzy, SOB, edema, etc... ~  NuclearStressTest 3/08 was neg- no ischemia, no infarct, EF= 58%... ~  EKG 3/13  showed NSR, rate74, NSSTTWA, NAD... ~  EKG 6/14 showed NSR, rate74, some incr voltage & NSSTTWA. ~  6/14:  Pt remains asymptomatic w/o CP, palpit, SOB, etc; but exam today revealed Gr2/6 sys murmur & 0000000 diastolic blow at LSB...  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   HYPERCHOLESTEROLEMIA (ICD-272.0) - on SIMVASTATIN 20mg /d, & FISH OIL 1000mg /d... ~  [place] 3/08 showed TChol 128, TG 147, HDL 31, LDL 67 ~  FLP 11/09 showed TChol 124, TG 122, HDL 33, LDL 67 ~  FLP 11/10 showed TChol 120, TG 150, HDL 31, LDL 59 ~  FLP 2/12 showed TChol 121, TG 182, HDL 28, LDL 56... rec better low fat diet & incr exerc. ~  FLP 3/13 on Simva20 showed TChol 136, TG 147, HDL 35, LDL 72 ~  FLP 6/14 on Simva20 showed TChol 140, TG 121, HDL 35, LDL 81 (he tried Niacin for ~67yr prior to this & no ch in HDL).  COLONIC POLYPS (ICD-211.3) - colonoscopy 2/05 by DrStark showed 18mm polyp removed= tubular adenoma... ~  He had colonoscopy DrStark 12/10> 2 polyps removed & one was a tubular adenoma & removed piecemeal therefore rec f/u 11yrs... ~  4/13: hx several tubular adenomas removed- last 12/10 was removed piecemeal & repeat colon 4/13 by DrStark was neg- f/u planned 59yrs.  NEPHROLITHIASIS (ICD-592.0) - eval 6/10 by DrDahlstadt w/ Lithotripsy required... given RAPAFLO to aide passing the stone. ~  He saw Urology [name] for kidney stones after last visit & he wanted to do ureteroscopy by pt declined & has remained asymptomatic he says... ~  He had f/u DrDalstedt 8/12> another right ureteral stone ~11mm (and a 33mm stone in the right kidney) which he passed spont w/ medical expulsive therapy; f/u UA clear & he  is doing satis...  TESTICULAR HYPOFUNCTION (ICD-257.2) ~  He noted low energy & labs checked 12/11 showed Low-T w/ testos level = 140 (350-890);  he was started  on Topical Androgel after prior auth done & approved but only took it for 1 month & stopped (misunderstood); we will recheck level & discuss options... ~  labs 2/12 showed Testosterone level = 240 & rec ANDROGEL vs Urology f/u, he will decide. ~  3/13: He again reports not taking the topical Androgel but notes that he felt betterwhile on it; he will commit to taking it regularly this time; Testos level = 199 & we will try to get autorization for ANDROGEL1.62% vs AXIRON... ~  6/14:  known Low-T (as low as 140 in 2011); he tried several topical meds but always stopped the Rx; off again & states he's feeling fine, good energy, no libido issues etc & he does not want to restart the meds...  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 ~  VitD defic> Neuro in [place] found VitD=26 (4/14) & treated w/ 50K x8wks then switching to VitD 1000u daily.  HEALTH MAINTENANCE:  he takes ASA 81mg /d, and Calcium/ Vitamins/ etc... ~  GI>  Followed by DrStark & due for f/u colonoscopy now (3/13)... ~  GU>  Followed by DrDahlstedt & passed kid stone 2012; Low-T as noted above & rx w/ Topical gel rx + Levitra... ~  Immunizations:  He gets the yearly Flu vaccine;  Given TDAP & PNEUMOVAX 3/13 at age 56...   Past Surgical History  Procedure Laterality Date  . Rhinoplasty  1980s  . Knee surgery Right 05/2012    arthroscopic  . Medial partial knee replacement Right 03/2013    Outpatient Encounter  Prescriptions as of 11/06/2013  Medication Sig  . albuterol (PROVENTIL HFA;VENTOLIN HFA) 108 (90 BASE) MCG/ACT inhaler Inhale 2 puffs into the lungs every 6 (six) hours as needed.  [name] amLODipine (NORVASC) 5 MG tablet Take 1 tablet (5 mg total) by mouth daily.  [name] aspirin 81 MG tablet Take 81 mg by mouth daily.  . brinzolamide (AZOPT) 1 % ophthalmic suspension Place 1 drop into both eyes 2 (two) times daily.  . cholecalciferol (VITAMIN D) 1000 UNITS tablet Take 1,000 Units by mouth daily.  . fish oil-omega-3 fatty acids 1000 MG capsule Take 2 g by mouth 2 (two) times daily.  . Multiple Vitamin (MULTIVITAMIN) capsule Take 1 capsule by mouth daily.  . naproxen sodium (ANAPROX) 220 MG tablet Per bottle as needed for pain  . simvastatin (ZOCOR) 20 MG tablet Take 1 tablet (20 mg total) by mouth every evening.  . vitamin C (ASCORBIC ACID) 500 MG tablet Take 500 mg by mouth 2 (two) times daily.  . celecoxib (CELEBREX) 200 MG capsule Take 200 mg by mouth daily.    No Known Allergies   Current Medications, Allergies, Past Medical History, Past Surgical History, Family History, and Social History were reviewed in  [facility] electronic medical record.   Review of Systems    The patient denies fever, chills, sweats, anorexia, fatigue, weakness, malaise, weight loss, sleep disorder, blurring, diplopia, eye irritation, eye discharge, vision loss, eye pain, photophobia, earache, ear discharge, tinnitus, decreased hearing, nasal congestion, nosebleeds, sore throat, hoarseness, chest pain, palpitations, syncope, dyspnea on exertion, orthopnea, PND, peripheral edema, cough, dyspnea at rest, excessive sputum, hemoptysis, wheezing, pleurisy, nausea, vomiting, diarrhea, constipation, change in bowel habits, abdominal pain, melena, hematochezia, jaundice, gas/bloating, indigestion/heartburn, dysphagia, odynophagia, dysuria,  urinary frequency, urinary hesitancy, nocturia, incontinence, back pain, joint pain, joint  swelling, muscle cramps, muscle weakness, stiffness, arthritis, sciatica, restless legs, leg pain at night, leg pain with exertion, rash, itching, dryness, suspicious lesions, paralysis, paresthesias, seizures, tremors, vertigo, transient blindness, frequent falls, frequent headaches, difficulty walking, depression, anxiety, memory loss, confusion, cold intolerance, heat intolerance, polydipsia, polyphagia, polyuria, unusual weight change, abnormal bruising, bleeding, enlarged lymph nodes, urticaria, allergic rash, hay fever, and recurrent infections.     Objective:   Physical Exam    WD, WN, 68 y/o WM in NAD... GENERAL:  Alert & oriented; pleasant & cooperative. HEENT:  [place]/AT, EOM-wnl, Hx Glaucoma on gtts, EACs-clear, TMs-wnl, NOSE-clear, THROAT-clear & wnl. NECK:  Supple w/ full ROM; no JVD; normal carotid impulses w/o bruits; no thyromegaly or nodules palpated; no lymphadenopathy. CHEST:  Clear to P & A; without wheezes/ rales/ or rhonchi. HEART:  Regular Rhythm; Gr2/6 sys murmur & YJ8/5 diastolic blow at LSB, w/o rubs or gallops heard... ABDOMEN:  Soft & nontender; normal bowel sounds; no organomegaly or masses detected., no guarding or rebound, neg CVA tenderness  EXT: without deformities or arthritic changes; no varicose veins/ venous insuffic/ or edema. NEURO:  motor testing normal;normal; gait normal & balance OK. DERM:  No lesions noted; no rash etc...     11/06/2013 Urine dipstick shows large blood, RBC   Assessment:

## 2013-11-06 NOTE — Patient Instructions (Signed)
Push fluids  Avoid heavy lifting .  We will call with urine culture results.  Call if blood in urine returns.  Return in 2 weeks for repeat urine sample.  Please contact office for sooner follow up if symptoms do not improve or worsen or seek emergency care

## 2013-11-06 NOTE — Assessment & Plan Note (Signed)
Painless hematuria  ?related to strenuous lifting.  UA reivewed no sign of pyuria , hold on abx for now  Cx is pending.  Check bmet and cbc  Repeat UA in 2 weeks if still positive will need referral to urology  If gross hematuria returns pt to call back , will need to see urology.

## 2013-11-07 LAB — URINE CULTURE
Colony Count: NO GROWTH
Organism ID, Bacteria: NO GROWTH

## 2013-11-08 NOTE — Progress Notes (Signed)
Quick Note:  Called spoke with patient, advised of lab results / recs as stated by TP. Pt verbalized his understanding and denied any questions. Addendum to 2.10.15 ov to add the BMET and CBCD. Pt aware to ensure the blood work is done when he returns. ______

## 2013-11-08 NOTE — Addendum Note (Signed)
Addended by: Parke Poisson E on: 11/08/2013 03:23 PM   Modules accepted: Orders

## 2013-11-21 ENCOUNTER — Other Ambulatory Visit (INDEPENDENT_AMBULATORY_CARE_PROVIDER_SITE_OTHER): Payer: Commercial Managed Care - HMO

## 2013-11-21 ENCOUNTER — Encounter: Payer: Self-pay | Admitting: Pulmonary Disease

## 2013-11-21 DIAGNOSIS — R310 Gross hematuria: Secondary | ICD-10-CM

## 2013-11-21 DIAGNOSIS — R319 Hematuria, unspecified: Secondary | ICD-10-CM | POA: Diagnosis not present

## 2013-11-21 DIAGNOSIS — I10 Essential (primary) hypertension: Secondary | ICD-10-CM | POA: Diagnosis not present

## 2013-11-21 LAB — URINALYSIS, ROUTINE W REFLEX MICROSCOPIC
BILIRUBIN URINE: NEGATIVE
KETONES UR: NEGATIVE
Leukocytes, UA: NEGATIVE
Nitrite: NEGATIVE
Specific Gravity, Urine: 1.01 (ref 1.000–1.030)
TOTAL PROTEIN, URINE-UPE24: NEGATIVE
URINE GLUCOSE: NEGATIVE
Urobilinogen, UA: 0.2 (ref 0.0–1.0)
WBC UA: NONE SEEN (ref 0–?)
pH: 6 (ref 5.0–8.0)

## 2013-11-21 LAB — CBC WITH DIFFERENTIAL/PLATELET
BASOS ABS: 0 10*3/uL (ref 0.0–0.1)
Basophils Relative: 0.5 % (ref 0.0–3.0)
Eosinophils Absolute: 0.2 10*3/uL (ref 0.0–0.7)
Eosinophils Relative: 3.5 % (ref 0.0–5.0)
HEMATOCRIT: 46 % (ref 39.0–52.0)
HEMOGLOBIN: 15.1 g/dL (ref 13.0–17.0)
LYMPHS ABS: 2.5 10*3/uL (ref 0.7–4.0)
Lymphocytes Relative: 34.7 % (ref 12.0–46.0)
MCHC: 32.8 g/dL (ref 30.0–36.0)
MCV: 96.7 fl (ref 78.0–100.0)
MONOS PCT: 10.7 % (ref 3.0–12.0)
Monocytes Absolute: 0.8 10*3/uL (ref 0.1–1.0)
NEUTROS ABS: 3.6 10*3/uL (ref 1.4–7.7)
Neutrophils Relative %: 50.6 % (ref 43.0–77.0)
Platelets: 190 10*3/uL (ref 150.0–400.0)
RBC: 4.76 Mil/uL (ref 4.22–5.81)
RDW: 13.8 % (ref 11.5–14.6)
WBC: 7.1 10*3/uL (ref 4.5–10.5)

## 2013-11-21 LAB — BASIC METABOLIC PANEL
BUN: 19 mg/dL (ref 6–23)
CHLORIDE: 104 meq/L (ref 96–112)
CO2: 29 meq/L (ref 19–32)
Calcium: 9.4 mg/dL (ref 8.4–10.5)
Creatinine, Ser: 1.1 mg/dL (ref 0.4–1.5)
GFR: 68.58 mL/min (ref >60.00)
GLUCOSE: 88 mg/dL (ref 70–99)
POTASSIUM: 4.2 meq/L (ref 3.5–5.1)
SODIUM: 140 meq/L (ref 135–145)

## 2013-11-21 MED ORDER — SIMVASTATIN 20 MG PO TABS: 20.0000 mg | ORAL_TABLET | Freq: Every evening | ORAL | Status: DC

## 2013-11-23 ENCOUNTER — Other Ambulatory Visit: Payer: Self-pay | Admitting: Adult Health

## 2013-11-23 ENCOUNTER — Ambulatory Visit: Payer: Commercial Managed Care - HMO

## 2013-11-23 DIAGNOSIS — R310 Gross hematuria: Secondary | ICD-10-CM

## 2013-11-23 DIAGNOSIS — R319 Hematuria, unspecified: Secondary | ICD-10-CM

## 2013-11-23 DIAGNOSIS — I350 Nonrheumatic aortic (valve) stenosis: Secondary | ICD-10-CM

## 2013-11-23 NOTE — Progress Notes (Signed)
Quick Note:  LMOM TCB x1. Orders only encounter for Urology referral. ______

## 2013-11-23 NOTE — Progress Notes (Signed)
Result Notes    Notes Recorded by Rinaldo Ratel, CMA on 11/23/2013 at 4:56 PM LMOM TCB x1. Orders only encounter for Urology referral. ------  Notes Recorded by Melvenia Needles, NP on 11/23/2013 at 10:39 AM UA cont to show microscopic hematuria Pt cont to have painless hematuria - Needs referral to Urology -has seen Dalstadt in past-try to get ov w/n next 1-2 weeks please  He has [employer] , requires referral from [place].

## 2013-11-26 LAB — PSA: PSA: 1.34 ng/mL (ref 0.10–4.00)

## 2013-11-29 MED ORDER — SIMVASTATIN 20 MG PO TABS: 20.0000 mg | ORAL_TABLET | Freq: Every evening | ORAL | Status: DC

## 2013-11-29 NOTE — Addendum Note (Signed)
Addended by: Horatio Pel on: 11/29/2013 12:48 PM   Modules accepted: Orders

## 2013-11-30 ENCOUNTER — Other Ambulatory Visit: Payer: Self-pay

## 2013-11-30 MED ORDER — AMLODIPINE BESYLATE 5 MG PO TABS: 5.0000 mg | ORAL_TABLET | Freq: Every day | ORAL | Status: DC

## 2013-12-26 HISTORY — PX: KNEE SURGERY: SHX244

## 2014-01-03 ENCOUNTER — Telehealth: Payer: Self-pay | Admitting: Pulmonary Disease

## 2014-01-03 NOTE — Telephone Encounter (Signed)
Pt has pending appt w/ SN in June. This needs to be cancelled and pt needs to find new PCP as SN retired from [facility] 12/26/13.  If pt wants to keep appt per Leigh then he can but chances are insurance will not pay for this visit. LMTCB x1 for pt

## 2014-01-04 NOTE — Telephone Encounter (Signed)
I spoke with the pt and advised. Appt for June cancelled. Pt states he is going to try to get an appt at [facility] [place]. He willc all to let us know his appt date. Selmer Bing, CMA

## 2014-01-11 ENCOUNTER — Ambulatory Visit: Payer: Self-pay | Admitting: Unknown Physician Specialty

## 2014-01-15 ENCOUNTER — Emergency Department: Payer: Self-pay | Admitting: Emergency Medicine

## 2014-01-15 LAB — CBC
HCT: 46.7 % (ref 40.0–52.0)
HGB: 15.7 g/dL (ref 13.0–18.0)
MCH: 31.9 pg (ref 26.0–34.0)
MCHC: 33.5 g/dL (ref 32.0–36.0)
MCV: 95 fL (ref 80–100)
Platelet: 178 10*3/uL (ref 150–440)
RBC: 4.91 10*6/uL (ref 4.40–5.90)
RDW: 13.1 % (ref 11.5–14.5)
WBC: 9.5 10*3/uL (ref 3.8–10.6)

## 2014-01-15 LAB — COMPREHENSIVE METABOLIC PANEL
ALT: 36 U/L (ref 12–78)
Albumin: 4.1 g/dL (ref 3.4–5.0)
Alkaline Phosphatase: 76 U/L
Anion Gap: 6 — ABNORMAL LOW (ref 7–16)
BUN: 31 mg/dL — ABNORMAL HIGH (ref 7–18)
Bilirubin,Total: 0.6 mg/dL (ref 0.2–1.0)
CALCIUM: 9.1 mg/dL (ref 8.5–10.1)
CO2: 29 mmol/L (ref 21–32)
Chloride: 106 mmol/L (ref 98–107)
Creatinine: 1.65 mg/dL — ABNORMAL HIGH (ref 0.60–1.30)
EGFR (African American): 49 — ABNORMAL LOW
GFR CALC NON AF AMER: 42 — AB
Glucose: 93 mg/dL (ref 65–99)
Osmolality: 287 (ref 275–301)
Potassium: 4.2 mmol/L (ref 3.5–5.1)
SGOT(AST): 31 U/L (ref 15–37)
Sodium: 141 mmol/L (ref 136–145)
TOTAL PROTEIN: 7.9 g/dL (ref 6.4–8.2)

## 2014-01-15 LAB — URINALYSIS, COMPLETE
BILIRUBIN, UR: NEGATIVE
GLUCOSE, UR: NEGATIVE mg/dL (ref 0–75)
Ketone: NEGATIVE
LEUKOCYTE ESTERASE: NEGATIVE
NITRITE: NEGATIVE
Ph: 5 (ref 4.5–8.0)
Protein: 100
SPECIFIC GRAVITY: 1.025 (ref 1.003–1.030)
Squamous Epithelial: NONE SEEN

## 2014-01-24 ENCOUNTER — Encounter (HOSPITAL_COMMUNITY): Payer: Self-pay | Admitting: Pharmacy Technician

## 2014-01-24 ENCOUNTER — Other Ambulatory Visit: Payer: Self-pay | Admitting: Urology

## 2014-01-24 ENCOUNTER — Encounter (HOSPITAL_COMMUNITY): Payer: Self-pay | Admitting: General Practice

## 2014-01-30 NOTE — H&P (Signed)
  Urology History and Physical Exam  CC: Right-sided kidney stone  HPI: 68 year old male presents at this time for lithotripsy of a 5 x 10 mm right renal pelvic stone. He has symptomatic with recurrent gross hematuria. Recent followup in the office revealed the stone to be present. Hounsfield units 1100, skin the stone distance 10 cm. I recommended lithotripsy. Risks and complications as well as alternatives were discussed. He desires to proceed  PMH: Past Medical History  Diagnosis Date  . Unspecified glaucoma   . Allergic rhinitis, cause unspecified   . Acute bronchitis   . Aortic valve stenosis   . Pure hypercholesterolemia   . Benign neoplasm of colon   . Calculus of kidney   . Other testicular hypofunction   . Aortic insufficiency   . Aortic root enlargement   . Heart murmur 2014  . Obstructive sleep apnea (adult) (pediatric)     pt. doesn't use his machine at home.    PSH: Past Surgical History  Procedure Laterality Date  . Rhinoplasty  1980s  . Knee surgery Right 05/2012    arthroscopic  . Medial partial knee replacement Right 03/2013  . Right knee-  Right January 11, 2014    knee cap- scrabbed    Allergies: No Known Allergies  Medications: No prescriptions prior to admission     Social History: History   Social History  . Marital Status: Married    Spouse Name: N/A    Number of Children: 2  . Years of Education: N/A   Occupational History  . manager    Social History Main Topics  . Smoking status: Never Smoker   . Smokeless tobacco: Never Used  . Alcohol Use: No  . Drug Use: No  . Sexual Activity: Not on file   Other Topics Concern  . Not on file   Social History Narrative  . No narrative on file    Family History: Family History  Problem Relation Age of Onset  . Colon cancer Neg Hx   . Heart disease Brother   . Heart disease Brother   . Heart disease Brother     Review of Systems: All systems are reviewed and are negative except for  gross hematuria.              Physical Exam: @VITALS2 @ General: No acute distress.  Awake. Head:  Normocephalic.  Atraumatic. ENT:  EOMI.  Mucous membranes moist Neck:  Supple.  No lymphadenopathy. CV:  S1 present. S2 present. Regular rate. Pulmonary: Equal effort bilaterally.  Clear to auscultation bilaterally. Abdomen: Soft.  Non- tender to palpation. Skin:  Normal turgor.  No visible rash. Extremity: No gross deformity of bilateral upper extremities.  No gross deformity of                             lower extremities. Neurologic: Alert. Appropriate mood.    Studies:  No results found for this basename: HGB, WBC, PLT,  in the last 72 hours  No results found for this basename: NA, K, CL, CO2, BUN, CREATININE, CALCIUM, MAGNESIUM, GFRNONAA, GFRAA,  in the last 72 hours   No results found for this basename: PT, INR, APTT,  in the last 72 hours   No components found with this basename: ABG,     Assessment:  5 x 10 mm right renal pelvic stone with procedure in  Plan: Extracorporeal shockwave lithotripsy

## 2014-01-31 ENCOUNTER — Encounter (HOSPITAL_COMMUNITY)
Admission: RE | Disposition: A | Discharge status: Home / Self Care | Payer: Self-pay | Source: Ambulatory Visit | Attending: Urology

## 2014-01-31 ENCOUNTER — Ambulatory Visit (HOSPITAL_COMMUNITY)
Admission: RE | Admit: 2014-01-31 | Discharge: 2014-01-31 | Disposition: A | Discharge status: Home / Self Care | Payer: Medicare HMO | Source: Ambulatory Visit | Attending: Urology | Admitting: Urology

## 2014-01-31 ENCOUNTER — Encounter (HOSPITAL_COMMUNITY): Payer: Self-pay | Admitting: *Deleted

## 2014-01-31 ENCOUNTER — Ambulatory Visit (HOSPITAL_COMMUNITY): Payer: Medicare HMO

## 2014-01-31 DIAGNOSIS — E7800 Pure hypercholesterolemia, unspecified: Secondary | ICD-10-CM | POA: Insufficient documentation

## 2014-01-31 DIAGNOSIS — H409 Unspecified glaucoma: Secondary | ICD-10-CM | POA: Insufficient documentation

## 2014-01-31 DIAGNOSIS — G4733 Obstructive sleep apnea (adult) (pediatric): Secondary | ICD-10-CM | POA: Insufficient documentation

## 2014-01-31 DIAGNOSIS — I359 Nonrheumatic aortic valve disorder, unspecified: Secondary | ICD-10-CM | POA: Insufficient documentation

## 2014-01-31 DIAGNOSIS — N200 Calculus of kidney: Secondary | ICD-10-CM | POA: Insufficient documentation

## 2014-01-31 SURGERY — LITHOTRIPSY, ESWL
Anesthesia: LOCAL | Laterality: Right

## 2014-01-31 MED ORDER — ACETAMINOPHEN 650 MG RE SUPP
650.0000 mg | RECTAL | Status: DC | PRN
  Filled 2014-01-31: qty 1

## 2014-01-31 MED ORDER — OXYCODONE-ACETAMINOPHEN 5-325 MG PO TABS: 1.0000 | ORAL_TABLET | ORAL | Status: DC | PRN

## 2014-01-31 MED ORDER — DEXTROSE-NACL 5-0.45 % IV SOLN
INTRAVENOUS | Status: DC
  Administered 2014-01-31: 14:00:00 via INTRAVENOUS

## 2014-01-31 MED ORDER — ACETAMINOPHEN 325 MG PO TABS: 650.0000 mg | ORAL_TABLET | ORAL | Status: DC | PRN

## 2014-01-31 MED ORDER — SODIUM CHLORIDE 0.9 % IJ SOLN: 3.0000 mL | Freq: Two times a day (BID) | INTRAMUSCULAR | Status: DC

## 2014-01-31 MED ORDER — SODIUM CHLORIDE 0.9 % IV SOLN: 250.0000 mL | INTRAVENOUS | Status: DC | PRN

## 2014-01-31 MED ORDER — SODIUM CHLORIDE 0.9 % IJ SOLN: 3.0000 mL | INTRAMUSCULAR | Status: DC | PRN

## 2014-01-31 MED ORDER — OXYCODONE HCL 5 MG PO TABS: 5.0000 mg | ORAL_TABLET | ORAL | Status: DC | PRN

## 2014-01-31 MED ORDER — DIAZEPAM 5 MG PO TABS
10.0000 mg | ORAL_TABLET | ORAL | Status: AC
  Administered 2014-01-31: 10 mg via ORAL
  Filled 2014-01-31: qty 2

## 2014-01-31 MED ORDER — DIPHENHYDRAMINE HCL 25 MG PO CAPS
25.0000 mg | ORAL_CAPSULE | ORAL | Status: AC
  Administered 2014-01-31: 25 mg via ORAL
  Filled 2014-01-31: qty 1

## 2014-01-31 MED ORDER — LEVOFLOXACIN 500 MG PO TABS
500.0000 mg | ORAL_TABLET | ORAL | Status: AC
  Administered 2014-01-31: 500 mg via ORAL
  Filled 2014-01-31: qty 1

## 2014-01-31 NOTE — Discharge Instructions (Signed)
See [facility] discharge instructions in chart.  

## 2014-02-26 ENCOUNTER — Ambulatory Visit (INDEPENDENT_AMBULATORY_CARE_PROVIDER_SITE_OTHER): Payer: Commercial Managed Care - HMO | Admitting: Cardiology

## 2014-02-26 ENCOUNTER — Encounter: Payer: Self-pay | Admitting: Cardiology

## 2014-02-26 VITALS — BP 130/82 | HR 63 | Ht 72.0 in | Wt 198.0 lb

## 2014-02-26 DIAGNOSIS — I10 Essential (primary) hypertension: Secondary | ICD-10-CM

## 2014-02-26 DIAGNOSIS — I351 Nonrheumatic aortic (valve) insufficiency: Secondary | ICD-10-CM

## 2014-02-26 DIAGNOSIS — I359 Nonrheumatic aortic valve disorder, unspecified: Secondary | ICD-10-CM

## 2014-02-26 DIAGNOSIS — I712 Thoracic aortic aneurysm, without rupture, unspecified: Secondary | ICD-10-CM

## 2014-02-26 DIAGNOSIS — I350 Nonrheumatic aortic (valve) stenosis: Secondary | ICD-10-CM

## 2014-02-26 LAB — BASIC METABOLIC PANEL
BUN: 26 mg/dL — ABNORMAL HIGH (ref 6–23)
CHLORIDE: 105 meq/L (ref 96–112)
CO2: 28 meq/L (ref 19–32)
Calcium: 9.6 mg/dL (ref 8.4–10.5)
Creatinine, Ser: 1.1 mg/dL (ref 0.4–1.5)
GFR: 67.83 mL/min (ref >60.00)
Glucose, Bld: 100 mg/dL — ABNORMAL HIGH (ref 70–99)
POTASSIUM: 4.1 meq/L (ref 3.5–5.1)
SODIUM: 139 meq/L (ref 135–145)

## 2014-02-26 NOTE — Patient Instructions (Signed)
We will schedule you for an echocardiogram and a CT of the aorta.   I will see you in 1 year.

## 2014-02-26 NOTE — Progress Notes (Signed)
I will  Jeremy Meadows Date of Birth: 1946-03-12 Medical Record #[id number]  History of Present Illness: Jeremy Meadows is seen for followup of aortic stenosis and insufficiency. He also has a thoracic aortic aneurysm measuring 5.1 cm. He has no symptoms of chest pain, shortness of breath, palpitations, or dizziness. 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 He is s/p knee surgery this past year but hasn't been cleared to play softball this year. He also had a kidney stone in February and had lithotripsy. His BP has improved on amlodipine.  Current Outpatient Prescriptions on File Prior to Visit  Medication Sig Dispense Refill  . albuterol (PROVENTIL HFA;VENTOLIN HFA) 108 (90 BASE) MCG/ACT inhaler Inhale 2 puffs into the lungs every 6 (six) hours as needed.  3 Inhaler  3  . amLODipine (NORVASC) 5 MG tablet Take 5 mg by mouth every evening.      . brinzolamide (AZOPT) 1 % ophthalmic suspension Place 1 drop into both eyes 2 (two) times daily.      . cholecalciferol (VITAMIN D) 1000 UNITS tablet Take 1,000 Units by mouth daily.      . Multiple Vitamin (MULTIVITAMIN) capsule Take 1 capsule by mouth daily.      . simvastatin (ZOCOR) 20 MG tablet Take 1 tablet (20 mg total) by mouth every evening.  90 tablet  3  . vitamin C (ASCORBIC ACID) 500 MG tablet Take 500 mg by mouth 2 (two) times daily.       No current facility-administered medications on file prior to visit.    No Known Allergies  Past Medical History  Diagnosis Date  . Unspecified glaucoma   . Allergic rhinitis, cause unspecified   . Acute bronchitis   . Aortic valve stenosis   . Pure hypercholesterolemia   . Benign neoplasm of colon   . Calculus of kidney   . Other testicular hypofunction   . Aortic insufficiency   . Aortic root enlargement   . Heart murmur 2014  . Obstructive sleep apnea (adult) (pediatric)     pt. doesn't use his machine at home.    Past Surgical History  Procedure Laterality Date  .  Rhinoplasty  1980s  . Knee surgery Right 05/2012    arthroscopic  . Medial partial knee replacement Right 03/2013  . Right knee-  Right January 11, 2014    knee cap- scrabbed  . Lithotripsy      History  Smoking status  . Never Smoker   Smokeless tobacco  . Never Used    History  Alcohol Use No    Family History  Problem Relation Age of Onset  . Colon cancer Neg Hx   . Heart disease Brother   . Heart disease Brother   . Heart disease Brother     Review of Systems: As noted in history of present illness. All other systems were reviewed and are negative.  Physical Exam: BP 130/82  Pulse 63  Ht 6' (1.829 m)  Wt 198 lb (89.812 kg)  BMI 26.85 kg/m2 He is a pleasant white male in no acute distress. HEENT: Normal Neck is without JVD, adenopathy, thyromegaly, or bruits. Carotid upstrokes are normal. Lungs: Clear Cardiovascular: Regular rate and rhythm. Normal S1 and S2. There is a grade 2/6 systolic ejection murmur the right upper sternal border. There is a grade 6-3/8 diastolic murmur at the left sternal border. PMI is normal. No gallop. Abdomen: Soft and nontender. No masses or bruits. No hepatosplenomegaly. Bowel sounds are  positive. Extremities: No cyanosis or edema. Pulses are 2+ and symmetric. Neuro: Alert and oriented x3. Cranial nerves II through XII are intact.  LABORATORY DATA: Lab Results  Component Value Date   WBC 7.1 11/21/2013   HGB 15.1 11/21/2013   HCT 46.0 11/21/2013   PLT 190.0 11/21/2013   GLUCOSE 88 11/21/2013   CHOL 140 02/27/2013   TRIG 121.0 02/27/2013   HDL 34.80* 02/27/2013   LDLCALC 81 02/27/2013   ALT 27 12/02/2011   AST 26 12/02/2011   NA 140 11/21/2013   K 4.2 11/21/2013   CL 104 11/21/2013   CREATININE 1.1 11/21/2013   BUN 19 11/21/2013   CO2 29 11/21/2013   TSH 1.28 12/02/2011   PSA 1.34 11/23/2013   ECG demonstrates normal sinus rhythm with nonspecific TWA.   Assessment / Plan: 1. Aortic stenosis and insufficiency. By valve gradient it appears that  his stenosis is moderate in severity. Aortic insufficiency was moderate to severe.  He is completely asymptomatic. We will repeat Echo now to see if there is any progression.  2. Thoracic aortic aneurysm. This measures 5.1 cm. We'll schedule for CT with contrast.  3. Hypertension-continue amlodipine 5 mg daily.  If his Echo and CT are stable will follow up in one year.

## 2014-03-05 ENCOUNTER — Ambulatory Visit: Payer: Medicare Other | Admitting: Pulmonary Disease

## 2014-03-13 ENCOUNTER — Ambulatory Visit (INDEPENDENT_AMBULATORY_CARE_PROVIDER_SITE_OTHER)
Admission: RE | Admit: 2014-03-13 | Discharge: 2014-03-13 | Disposition: A | Discharge status: Home / Self Care | Payer: Commercial Managed Care - HMO | Source: Ambulatory Visit | Attending: Cardiology | Admitting: Cardiology

## 2014-03-13 ENCOUNTER — Ambulatory Visit (HOSPITAL_COMMUNITY): Payer: Medicare HMO | Attending: Cardiology | Admitting: Radiology

## 2014-03-13 DIAGNOSIS — I351 Nonrheumatic aortic (valve) insufficiency: Secondary | ICD-10-CM

## 2014-03-13 DIAGNOSIS — R011 Cardiac murmur, unspecified: Secondary | ICD-10-CM | POA: Insufficient documentation

## 2014-03-13 DIAGNOSIS — I712 Thoracic aortic aneurysm, without rupture, unspecified: Secondary | ICD-10-CM

## 2014-03-13 DIAGNOSIS — E785 Hyperlipidemia, unspecified: Secondary | ICD-10-CM | POA: Insufficient documentation

## 2014-03-13 DIAGNOSIS — I359 Nonrheumatic aortic valve disorder, unspecified: Secondary | ICD-10-CM | POA: Insufficient documentation

## 2014-03-13 DIAGNOSIS — I10 Essential (primary) hypertension: Secondary | ICD-10-CM

## 2014-03-13 DIAGNOSIS — I350 Nonrheumatic aortic (valve) stenosis: Secondary | ICD-10-CM

## 2014-03-13 DIAGNOSIS — I059 Rheumatic mitral valve disease, unspecified: Secondary | ICD-10-CM | POA: Insufficient documentation

## 2014-03-13 DIAGNOSIS — G4733 Obstructive sleep apnea (adult) (pediatric): Secondary | ICD-10-CM | POA: Insufficient documentation

## 2014-03-13 MED ORDER — IOHEXOL 350 MG/ML SOLN
100.0000 mL | Freq: Once | INTRAVENOUS | Status: AC | PRN
  Administered 2014-03-13: 100 mL via INTRAVENOUS

## 2014-03-13 NOTE — Progress Notes (Signed)
Echocardiogram performed.  

## 2014-03-15 ENCOUNTER — Encounter: Payer: Self-pay | Admitting: Pulmonary Disease

## 2014-03-15 MED ORDER — ALBUTEROL SULFATE HFA 108 (90 BASE) MCG/ACT IN AERS: 2.0000 | INHALATION_SPRAY | Freq: Four times a day (QID) | RESPIRATORY_TRACT | Status: DC | PRN

## 2014-03-19 ENCOUNTER — Encounter: Payer: Self-pay | Admitting: Pulmonary Disease

## 2014-03-20 ENCOUNTER — Other Ambulatory Visit: Payer: Self-pay

## 2014-03-20 ENCOUNTER — Other Ambulatory Visit: Payer: Self-pay | Admitting: Pulmonary Disease

## 2014-03-20 DIAGNOSIS — R911 Solitary pulmonary nodule: Secondary | ICD-10-CM

## 2014-03-20 MED ORDER — ALBUTEROL SULFATE HFA 108 (90 BASE) MCG/ACT IN AERS: 2.0000 | INHALATION_SPRAY | Freq: Four times a day (QID) | RESPIRATORY_TRACT | Status: DC | PRN

## 2014-03-20 MED ORDER — AMOXICILLIN-POT CLAVULANATE 875-125 MG PO TABS: 1.0000 | ORAL_TABLET | Freq: Two times a day (BID) | ORAL | Status: DC

## 2014-03-20 NOTE — Telephone Encounter (Signed)
Spoke with the pt  [facility]  Pt c/o wheezing, increased SOB, and prod cough with minimal brown sputum He states no fever, chest tightness, CP or other co's  [facility] [place] Will forward to [facility] per her request  Thanks! No Known Allergies

## 2014-03-21 ENCOUNTER — Telehealth: Payer: Self-pay | Admitting: Pulmonary Disease

## 2014-03-21 ENCOUNTER — Telehealth: Payer: Self-pay | Admitting: Cardiology

## 2014-03-21 DIAGNOSIS — R911 Solitary pulmonary nodule: Secondary | ICD-10-CM

## 2014-03-21 MED ORDER — ALBUTEROL SULFATE HFA 108 (90 BASE) MCG/ACT IN AERS: 2.0000 | INHALATION_SPRAY | Freq: Four times a day (QID) | RESPIRATORY_TRACT | Status: DC | PRN

## 2014-03-21 NOTE — Telephone Encounter (Signed)
Called and spoke with pt and he is aware of the refill of the inhaler that has been sent to [facility] and to the mail order pharmacy. Nothing further is needed.

## 2014-03-21 NOTE — Telephone Encounter (Signed)
New  Problem    Pt returning your call.

## 2014-03-25 NOTE — Telephone Encounter (Signed)
Returned call to patient 03/22/14 he stated he has not received appointment with CT surgery.Placed 2nd order for patient to see [facility] for new LLL pulmonary nodule.Schedulers will call back with appointment.

## 2014-03-26 ENCOUNTER — Encounter (HOSPITAL_COMMUNITY): Payer: Medicare HMO

## 2014-03-26 ENCOUNTER — Other Ambulatory Visit: Payer: Self-pay | Admitting: *Deleted

## 2014-03-26 DIAGNOSIS — R918 Other nonspecific abnormal finding of lung field: Secondary | ICD-10-CM

## 2014-03-27 ENCOUNTER — Encounter (HOSPITAL_COMMUNITY): Payer: Medicare HMO

## 2014-04-04 ENCOUNTER — Institutional Professional Consult (permissible substitution) (INDEPENDENT_AMBULATORY_CARE_PROVIDER_SITE_OTHER): Payer: Commercial Managed Care - HMO | Admitting: Cardiothoracic Surgery

## 2014-04-04 ENCOUNTER — Ambulatory Visit (HOSPITAL_COMMUNITY)
Admission: RE | Admit: 2014-04-04 | Discharge: 2014-04-04 | Disposition: A | Discharge status: Home / Self Care | Payer: Medicare HMO | Source: Ambulatory Visit | Attending: Cardiothoracic Surgery | Admitting: Cardiothoracic Surgery

## 2014-04-04 ENCOUNTER — Encounter: Payer: Self-pay | Admitting: Cardiothoracic Surgery

## 2014-04-04 ENCOUNTER — Other Ambulatory Visit: Payer: Self-pay | Admitting: *Deleted

## 2014-04-04 VITALS — BP 141/96 | HR 74 | Resp 16 | Ht 72.0 in | Wt 195.0 lb

## 2014-04-04 DIAGNOSIS — R911 Solitary pulmonary nodule: Secondary | ICD-10-CM

## 2014-04-04 DIAGNOSIS — I719 Aortic aneurysm of unspecified site, without rupture: Secondary | ICD-10-CM

## 2014-04-04 DIAGNOSIS — R918 Other nonspecific abnormal finding of lung field: Secondary | ICD-10-CM

## 2014-04-04 DIAGNOSIS — D381 Neoplasm of uncertain behavior of trachea, bronchus and lung: Secondary | ICD-10-CM

## 2014-04-04 LAB — PULMONARY FUNCTION TEST
DL/VA % pred: 100 %
DL/VA: 4.69 ml/min/mmHg/L
DLCO cor % pred: 94 %
DLCO cor: 31.88 ml/min/mmHg
DLCO unc % pred: 94 %
DLCO unc: 31.88 ml/min/mmHg
FEF 25-75 Post: 2.53 L/sec
FEF 25-75 Pre: 1.7 L/sec
FEF2575-%Change-Post: 48 %
FEF2575-%Pred-Post: 95 %
FEF2575-%Pred-Pre: 64 %
FEV1-%Change-Post: 15 %
FEV1-%Pred-Post: 85 %
FEV1-%Pred-Pre: 73 %
FEV1-Post: 2.94 L
FEV1-Pre: 2.54 L
FEV1FVC-%Change-Post: 3 %
FEV1FVC-%Pred-Pre: 92 %
FEV6-%Change-Post: 11 %
FEV6-%Pred-Post: 92 %
FEV6-%Pred-Pre: 83 %
FEV6-Post: 4.09 L
FEV6-Pre: 3.66 L
FEV6FVC-%Change-Post: 0 %
FEV6FVC-%Pred-Post: 103 %
FEV6FVC-%Pred-Pre: 103 %
FVC-%Change-Post: 11 %
FVC-%Pred-Post: 89 %
FVC-%Pred-Pre: 79 %
FVC-Post: 4.15 L
FVC-Pre: 3.72 L
Post FEV1/FVC ratio: 71 %
Post FEV6/FVC ratio: 99 %
Pre FEV1/FVC ratio: 68 %
Pre FEV6/FVC Ratio: 98 %
RV % pred: 123 %
RV: 3.05 L
TLC % pred: 98 %
TLC: 7.11 L

## 2014-04-04 MED ORDER — ALBUTEROL SULFATE (2.5 MG/3ML) 0.083% IN NEBU
2.5000 mg | INHALATION_SOLUTION | Freq: Once | RESPIRATORY_TRACT | Status: AC
  Administered 2014-04-04: 2.5 mg via RESPIRATORY_TRACT

## 2014-04-04 NOTE — Progress Notes (Signed)
[address]       [place] [address]             [phone]                    Jeremy Meadows [place] Medical Record #[id number] Date of Birth: 10-22-1945  Referring: [name], Peter M, MD Primary Care: Jeremy Space, MD  Chief Complaint:    Chief Complaint  Patient presents with  . Lung Lesion    LLLobe...CT CHEST...PFT'S LATER TODAY    History of Present Illness:    Jeremy Meadows 68 y.o. male is seen in the office  today for question of a new bilobular left lower lobe lung lesion. Most exactly a year ago the patient was noted by Dr. Lenna Meadows  have a murmur. Evaluation for this included a CT scan of the chest which demonstrated a 5.1 cm dilated ascending aorta, echocardiogram showed severe aortic stenosis with a peak velocity across the aortic valve of 396 mean gradient of 41 and peak gradient of 65. The patient return one year later to see Dr. Peter [name] CT scan of the chest was done to measure the size of his aorta and a new left lower lobe lung nodule was noted. The patient was referred to thoracic surgery for workup of the lung nodule. The patient is to have pulmonary function studies done later today. He does have a history of adult onset asthma, for which he uses bronchodilators intermittently and has for 25 or 30 years. The patient's never been a smoker, retired from [employer], no history of asbestos exposure.  The patient denies syncope denies angina denies presyncope, he does wake up at night wheezing, he does note shortness of breath with exertion. He's had no previous heart catheterization for documentation of coronary artery disease. He denies diabetes does have hypertension and takes a statin for lipid treatment.     Current Activity/ Functional Status:  Patient is independent with mobility/ambulation, transfers, ADL's, IADL's.   Zubrod Score: At the time of surgery this patient's most appropriate activity status/level should  be described as: [x]     0    Normal activity, no symptoms []     1    Restricted in physical strenuous activity but ambulatory, able to do out light work []     2    Ambulatory and capable of self care, unable to do work activities, up and about               >50 % of waking hours                              []     3    Only limited self care, in bed greater than 50% of waking hours []     4    Completely disabled, no self care, confined to bed or chair []     5    Moribund   Past Medical History  Diagnosis Date  . Unspecified glaucoma   . Allergic rhinitis, cause unspecified   . Acute bronchitis   . Aortic valve stenosis   . Pure hypercholesterolemia   . Benign neoplasm of colon   . Calculus of kidney   . Other testicular hypofunction   . Aortic insufficiency   . Aortic root enlargement   . Heart murmur 2014  . Obstructive sleep apnea (adult) (pediatric)  pt. doesn't use his machine at home.    Past Surgical History  Procedure Laterality Date  . Rhinoplasty  1980s  . Knee surgery Right 05/2012    arthroscopic  . Medial partial knee replacement Right 03/2013  . Right knee-  Right January 11, 2014    knee cap- scrabbed  . Lithotripsy      Family History  Problem Relation Age of Onset  . Colon cancer Neg Hx   . Heart disease Brother   . Heart disease Brother   . Heart disease Brother    patient's family history significant for his father died at age 17 suddenly, told it was a heart attack but no confirmation of this, mother died in her 24s of carcinoma of the jaw Patient has 3 brothers the oldest has had carotid artery disease, no Brother coronary artery bypass grafting, youngest brother has had coronary stents most recently 3 weeks ago. Patient has 2 sons are healthy     History  Smoking status  . Never Smoker   Smokeless tobacco  . Never Used    History  Alcohol Use No     No Known Allergies  Current Outpatient Prescriptions  Medication Sig Dispense Refill    . albuterol (VENTOLIN HFA) 108 (90 BASE) MCG/ACT inhaler Inhale 2 puffs into the lungs every 6 (six) hours as needed for wheezing or shortness of breath.  1 Inhaler  0  . amLODipine (NORVASC) 5 MG tablet Take 5 mg by mouth every evening.      . brinzolamide (AZOPT) 1 % ophthalmic suspension Place 1 drop into both eyes 2 (two) times daily.      . cholecalciferol (VITAMIN D) 1000 UNITS tablet Take 1,000 Units by mouth daily.      . Melatonin 3 MG TABS Take 6 mg by mouth at bedtime.      . Multiple Vitamin (MULTIVITAMIN) capsule Take 1 capsule by mouth daily.      . simvastatin (ZOCOR) 20 MG tablet Take 1 tablet (20 mg total) by mouth every evening.  90 tablet  3  . vitamin C (ASCORBIC ACID) 500 MG tablet Take 500 mg by mouth 2 (two) times daily.       No current facility-administered medications for this visit.     Review of Systems:     Cardiac Review of Systems: Y or N  Chest Pain [   n ]  Resting SOB [  n ] Exertional SOB  Blue.Reese  ]  Orthopnea [ n ]   Pedal Edema [ n  ]    Palpitations [  n] Syncope  [ n ]   Presyncope [ n  ]  General Review of Systems: [Y] = yes [  ]=no Constitional: recent weight change [n  ];  Wt loss over the last 3 months [   ] anorexia [  ]; fatigue [  ]; nausea [  ]; night sweats [  ]; fever [  ]; or chills [  ];          Dental: poor dentition[  ]; Last Dentist visit:   Eye : blurred vision [  ]; diplopia [   ]; vision changes [  ];  Amaurosis fugax[  ]; Resp: cough [  ];  wheezing[ y ];  hemoptysis[ n ]; shortness of breath[ n ]; paroxysmal nocturnal dyspnea[n  ]; dyspnea on exertion[y  ]; or orthopnea[ n ];  GI:  gallstones[  ], vomiting[n  ];  dysphagia[  ];  melena[  ];  hematochezia [n  ]; heartburn[  ];   Hx of  Colonoscopy[ y ]; GU: kidney stones [  ]; hematuria[  ];   dysuria [  ];  nocturia[  ];  history of     obstruction [  ]; urinary frequency [  ]             Skin: rash, swelling[  ];, hair loss[  ];  peripheral edema[  ];  or itching[  ]; Musculosketetal:  myalgias[  ];  joint swelling[  ];  joint erythema[  ];  joint pain[  ];  back pain[  ];  Heme/Lymph: bruising[  ];  bleeding[  ];  anemia[  ];  Neuro: TIA[ n ];  headaches[ n ];  stroke[  ];  vertigo[  ];  seizures[n  ];   paresthesias[  ];  difficulty walking[n  ];  Psych:depression[  ]; anxiety[  ];  Endocrine: diabetes[  n];  thyroid dysfunction[n  ];  Immunizations: Flu up to date [  ]; Pneumococcal up to date [  ];  Other:  Physical Exam: BP 141/96  Pulse 74  Resp 16  Ht 6' (1.829 Meadows)  Wt 195 lb (88.451 kg)  BMI 26.44 kg/m2  SpO2 97%  PHYSICAL EXAMINATION:  General appearance: alert, cooperative, appears stated age and no distress Neurologic: intact Heart: systolic murmur: holosystolic 3/6, crescendo throughout the precordium Lungs: clear to auscultation bilaterally Abdomen: soft, non-tender; bowel sounds normal; no masses,  no organomegaly Extremities: extremities normal, atraumatic, no cyanosis or edema and Homans sign is negative, no sign of DVT Patient has no cervical or supraclavicular adenopathy, he has full pedal pulses DP and PT bilaterally, he has no carotid bruits He has a vertigo, noted   Diagnostic Studies & Laboratory data:     Recent Radiology Findings:   Ct Angio Chest W/cm &/or Wo Cm  03/13/2014   CLINICAL DATA:  Thoracic aortic aneurysm  EXAM: CT ANGIOGRAPHY CHEST WITH CONTRAST  TECHNIQUE: Multidetector CT imaging of the chest was performed using the standard protocol during bolus administration of intravenous contrast. Multiplanar CT image reconstructions and MIPs were obtained to evaluate the vascular anatomy.  CONTRAST:  180mL OMNIPAQUE IOHEXOL 350 MG/ML SOLN  COMPARISON:  03/13/2013  FINDINGS: The heart size is normal. There is no pericardial effusion. There is adequate opacification of the thoracic aorta. The ascending aorta at the level of the right main pulmonary artery measures 5.1 cm. The proximal aortic Large measures 5 cm in greatest transverse  dimension. The descending thoracic aorta is normal in caliber. There are aortic valvular calcifications. There is no aortic dissection.  The main pulmonary artery, right main pulmonary artery and left main pulmonary arteries are normal in size.  There is no pleural effusion or pneumothorax. There is a new left lower lobe bilobed pulmonary nodule measuring 2.8 x 2.2 cm in greatest dimension.  There is no axillary, hilar, or mediastinal adenopathy.  There is no lytic or blastic osseous lesion. The multiple lower thoracic spine Schmorl's nodes with mild anterior chronic wedge compression deformities of the vertebral bodies. This appearance can be seen in the setting of Scheuermann's disease.  The visualized portions of the upper abdomen are unremarkable.  Review of the MIP images confirms the above findings.  IMPRESSION: 1. New left lower lobe bilobed pulmonary nodule measuring 2.8 x 2.2 cm in greatest dimension. These results will be called to the ordering clinician or representative by the Radiologist Assistant, and communication documented  in the PACS or zVision Dashboard. 2. Stable 5.1 cm ascending aortic aneurysm. 3. Aortic valvular calcification.   Electronically Signed   By: Kathreen Devoid   On: 03/13/2014 09:35      Recent Lab Findings: Lab Results  Component Value Date   WBC 7.1 11/21/2013   HGB 15.1 11/21/2013   HCT 46.0 11/21/2013   PLT 190.0 11/21/2013   GLUCOSE 100* 02/26/2014   CHOL 140 02/27/2013   TRIG 121.0 02/27/2013   HDL 34.80* 02/27/2013   LDLCALC 81 02/27/2013   ALT 27 12/02/2011   AST 26 12/02/2011   NA 139 02/26/2014   K 4.1 02/26/2014   CL 105 02/26/2014   CREATININE 1.1 02/26/2014   BUN 26* 02/26/2014   CO2 28 02/26/2014   TSH 1.28 12/02/2011   Aortic Size Index=      5.2   /Body surface area is 2.12 meters squared. = 2.45  < 2.75 cm/m2      4% risk per year 2.75 to 4.25          8% risk per year > 4.25 cm/m2    20% risk per year     Assessment / Plan:   #1 new left lower lobe bilobed  pulmonary nodule measuring 2.8 x 2.2 cm- possible clinical stage I lung carcinoma, but could be benign inflammatory. #2 severe aortic stenosis #3 significantly dilated ascending aorta 5.2-5.3 cm #4 adult onset asthma- x30 years  I reviewed the current significant diagnoses. I've recommended to the patient that we proceed with obtaining pulmonary function studies, PET scan,  Obtain copy of the raw data of the previously done CAT scan, for possible navigational bronchoscopy I will see the patient back following the PET scan , if the clinical workup indicates high likelihood of early stage lung cancer treatable by resection we will have to consider the patient's severe aortic stenosis and dilated ascending aorta which may also require treatment . He has not had a heart catheterization in the past and will need that before any cardiac surgery and possibly before any lung surgery .   I spent 55 minutes counseling the patient face to face. The total time spent in the appointment was 80 minutes.  Grace Isaac MD      [address] [place] [address] Office [phone]   Beeper [phone]  04/04/2014 12:50 PM

## 2014-04-09 ENCOUNTER — Encounter: Payer: Self-pay | Admitting: Cardiothoracic Surgery

## 2014-04-09 ENCOUNTER — Ambulatory Visit (HOSPITAL_COMMUNITY)
Admission: RE | Admit: 2014-04-09 | Discharge: 2014-04-09 | Disposition: A | Discharge status: Home / Self Care | Payer: Medicare HMO | Source: Ambulatory Visit | Attending: Cardiothoracic Surgery | Admitting: Cardiothoracic Surgery

## 2014-04-09 ENCOUNTER — Other Ambulatory Visit: Payer: Self-pay | Admitting: *Deleted

## 2014-04-09 ENCOUNTER — Ambulatory Visit (INDEPENDENT_AMBULATORY_CARE_PROVIDER_SITE_OTHER): Payer: Commercial Managed Care - HMO | Admitting: Cardiothoracic Surgery

## 2014-04-09 ENCOUNTER — Ambulatory Visit (HOSPITAL_COMMUNITY): Payer: Medicare HMO

## 2014-04-09 VITALS — BP 136/90 | HR 90 | Resp 20 | Ht 72.0 in | Wt 195.0 lb

## 2014-04-09 DIAGNOSIS — R911 Solitary pulmonary nodule: Secondary | ICD-10-CM

## 2014-04-09 DIAGNOSIS — I719 Aortic aneurysm of unspecified site, without rupture: Secondary | ICD-10-CM

## 2014-04-09 DIAGNOSIS — I712 Thoracic aortic aneurysm, without rupture, unspecified: Secondary | ICD-10-CM | POA: Insufficient documentation

## 2014-04-09 DIAGNOSIS — K8020 Calculus of gallbladder without cholecystitis without obstruction: Secondary | ICD-10-CM | POA: Insufficient documentation

## 2014-04-09 DIAGNOSIS — D381 Neoplasm of uncertain behavior of trachea, bronchus and lung: Secondary | ICD-10-CM

## 2014-04-09 DIAGNOSIS — I350 Nonrheumatic aortic (valve) stenosis: Secondary | ICD-10-CM

## 2014-04-09 DIAGNOSIS — I359 Nonrheumatic aortic valve disorder, unspecified: Secondary | ICD-10-CM

## 2014-04-09 LAB — GLUCOSE, CAPILLARY: Glucose-Capillary: 97 mg/dL (ref 70–99)

## 2014-04-09 MED ORDER — FLUDEOXYGLUCOSE F - 18 (FDG) INJECTION
9.6000 | Freq: Once | INTRAVENOUS | Status: AC | PRN
Start: 2014-04-09 — End: 2014-04-09
  Administered 2014-04-09: 9.6 via INTRAVENOUS

## 2014-04-09 NOTE — Progress Notes (Signed)
[address]       [place] [address]             [id number]                    Hilbert M Advani [facility] Record #[id number] Date of Birth: 04/08/1946  Referring: [name], Peter M, MD Primary Care: Noralee Space, MD  Chief Complaint:    Chief Complaint  Patient presents with  . Lung Lesion    F/U to discuss PET Scan    History of Present Illness:    Jeremy Meadows 68 y.o. male is seen in the office  today for question of a new bilobular left lower lobe lung lesion. Most exactly a year ago the patient was noted by Dr. Lenna Gilford  have a murmur. Evaluation for this included a CT scan of the chest which demonstrated a 5.1 cm dilated ascending aorta, echocardiogram showed severe aortic stenosis with a peak velocity across the aortic valve of 396 mean gradient of 41 and peak gradient of 65. The patient return one year later to see Dr. Peter [name] CT scan of the chest was done to measure the size of his aorta and a new left lower lobe lung nodule was noted. The patient was referred to thoracic surgery for workup of the lung nodule. The patient is to have pulmonary function studies done later today. He does have a history of adult onset asthma, for which he uses bronchodilators intermittently and has for 25 or 30 years. The patient's never been a smoker, retired from [employer], no history of asbestos exposure.  The patient denies syncope denies angina denies presyncope, he does wake up at night wheezing, he does note shortness of breath with exertion. He's had no previous heart catheterization for documentation of coronary artery disease. He denies diabetes does have hypertension and takes a statin for lipid treatment.     Current Activity/ Functional Status:  Patient is independent with mobility/ambulation, transfers, ADL's, IADL's.   Zubrod Score: At the time of surgery this patient's most appropriate activity status/level should be described  as: [x]     0    Normal activity, no symptoms []     1    Restricted in physical strenuous activity but ambulatory, able to do out light work []     2    Ambulatory and capable of self care, unable to do work activities, up and about               >50 % of waking hours                              []     3    Only limited self care, in bed greater than 50% of waking hours []     4    Completely disabled, no self care, confined to bed or chair []     5    Moribund   Past Medical History  Diagnosis Date  . Unspecified glaucoma   . Allergic rhinitis, cause unspecified   . Acute bronchitis   . Aortic valve stenosis   . Pure hypercholesterolemia   . Benign neoplasm of colon   . Calculus of kidney   . Other testicular hypofunction   . Aortic insufficiency   . Aortic root enlargement   . Heart murmur 2014  . Obstructive sleep apnea (adult) (pediatric)  pt. doesn't use his machine at home.    Past Surgical History  Procedure Laterality Date  . Rhinoplasty  1980s  . Knee surgery Right 05/2012    arthroscopic  . Medial partial knee replacement Right 03/2013  . Right knee-  Right January 11, 2014    knee cap- scrabbed  . Lithotripsy      Family History  Problem Relation Age of Onset  . Colon cancer Neg Hx   . Heart disease Brother   . Heart disease Brother   . Heart disease Brother    patient's family history significant for his father died at age 58 suddenly, told it was a heart attack but no confirmation of this, mother died in her 56s of carcinoma of the jaw Patient has 3 brothers the oldest has had carotid artery disease, no Brother coronary artery bypass grafting, youngest brother has had coronary stents most recently 3 weeks ago. Patient has 2 sons are healthy     History  Smoking status  . Never Smoker   Smokeless tobacco  . Never Used    History  Alcohol Use No     No Known Allergies  Current Outpatient Prescriptions  Medication Sig Dispense Refill  . albuterol  (VENTOLIN HFA) 108 (90 BASE) MCG/ACT inhaler Inhale 2 puffs into the lungs every 6 (six) hours as needed for wheezing or shortness of breath.  1 Inhaler  0  . amLODipine (NORVASC) 5 MG tablet Take 5 mg by mouth every evening.      . brinzolamide (AZOPT) 1 % ophthalmic suspension Place 1 drop into both eyes 2 (two) times daily.      . cholecalciferol (VITAMIN D) 1000 UNITS tablet Take 1,000 Units by mouth daily.      . Melatonin 3 MG TABS Take 6 mg by mouth at bedtime.      . Multiple Vitamin (MULTIVITAMIN) capsule Take 1 capsule by mouth daily.      . simvastatin (ZOCOR) 20 MG tablet Take 1 tablet (20 mg total) by mouth every evening.  90 tablet  3  . vitamin C (ASCORBIC ACID) 500 MG tablet Take 500 mg by mouth 2 (two) times daily.       No current facility-administered medications for this visit.     Review of Systems:     Cardiac Review of Systems: Y or N  Chest Pain [   n ]  Resting SOB [  n ] Exertional SOB  Blue.Reese  ]  Orthopnea [ n ]   Pedal Edema [ n  ]    Palpitations [  n] Syncope  [ n ]   Presyncope [ n  ]  General Review of Systems: [Y] = yes [  ]=no Constitional: recent weight change [n  ];  Wt loss over the last 3 months [   ] anorexia [  ]; fatigue [  ]; nausea [  ]; night sweats [  ]; fever [  ]; or chills [  ];          Dental: poor dentition[  ]; Last Dentist visit:   Eye : blurred vision [  ]; diplopia [   ]; vision changes [  ];  Amaurosis fugax[  ]; Resp: cough [  ];  wheezing[ y ];  hemoptysis[ n ]; shortness of breath[ n ]; paroxysmal nocturnal dyspnea[n  ]; dyspnea on exertion[y  ]; or orthopnea[ n ];  GI:  gallstones[  ], vomiting[n  ];  dysphagia[  ];  melena[  ];  hematochezia [n  ]; heartburn[  ];   Hx of  Colonoscopy[ y ]; GU: kidney stones [  ]; hematuria[  ];   dysuria [  ];  nocturia[  ];  history of     obstruction [  ]; urinary frequency [  ]             Skin: rash, swelling[  ];, hair loss[  ];  peripheral edema[  ];  or itching[  ]; Musculosketetal: myalgias[  ];   joint swelling[  ];  joint erythema[  ];  joint pain[  ];  back pain[  ];  Heme/Lymph: bruising[  ];  bleeding[  ];  anemia[  ];  Neuro: TIA[ n ];  headaches[ n ];  stroke[  ];  vertigo[  ];  seizures[n  ];   paresthesias[  ];  difficulty walking[n  ];  Psych:depression[  ]; anxiety[  ];  Endocrine: diabetes[  n];  thyroid dysfunction[n  ];  Immunizations: Flu up to date [  ]; Pneumococcal up to date [  ];  Other:  Physical Exam: BP 136/90  Pulse 90  Resp 20  Ht 6' (1.829 m)  Wt 195 lb (88.451 kg)  BMI 26.44 kg/m2  SpO2 96%  PHYSICAL EXAMINATION:  General appearance: alert, cooperative, appears stated age and no distress Neurologic: intact Heart: systolic murmur: holosystolic 3/6, crescendo throughout the precordium Lungs: clear to auscultation bilaterally Abdomen: soft, non-tender; bowel sounds normal; no masses,  no organomegaly Extremities: extremities normal, atraumatic, no cyanosis or edema and Homans sign is negative, no sign of DVT Patient has no cervical or supraclavicular adenopathy, he has full pedal pulses DP and PT bilaterally, he has no carotid bruits He has a vertigo, noted   Diagnostic Studies & Laboratory data:     Recent Radiology Findings:  Nm Pet Image Initial (pi) Skull Base To Thigh  04/09/2014   CLINICAL DATA:  Initial treatment strategy for left lung nodule.  EXAM: NUCLEAR MEDICINE PET SKULL BASE TO THIGH  TECHNIQUE: 9.6 mCi F-18 FDG was injected intravenously. Full-ring PET imaging was performed from the skull base to thigh after the radiotracer. CT data was obtained and used for attenuation correction and anatomic localization.  FASTING BLOOD GLUCOSE:  Value: 97 mg/dl  COMPARISON:  Chest CT on 03/13/2014  FINDINGS: NECK  No hypermetabolic lymph nodes in the neck.  CHEST  No hypermetabolic mediastinal or hilar nodes. 2.1 cm lobulated nodule in the lateral aspect of the left lower lobe remains stable in size and appearance on CT and shows low-grade metabolic  activity, with SUV max of 1.7. This is suspicious for low grade adenocarcinoma. No other pulmonary nodules identified on CT.  At ascending thoracic aortic aneurysm is seen measuring 5.6 cm in diameter. Asymmetric muscular activity noted in right shoulder girdle.  ABDOMEN/PELVIS  No abnormal hypermetabolic activity within the liver, pancreas, adrenal glands, or spleen. No hypermetabolic lymph nodes in the abdomen or pelvis. Small calcified gallstones incidentally noted.  SKELETON  No focal hypermetabolic activity to suggest skeletal metastasis.  IMPRESSION: 2.1 cm lobulated nodule in the left lower lobe shows low-grade metabolic activity with SUV max of 1.7, suspicious for low-grade adenocarcinoma.  No evidence of nodal or distant metastatic disease.  5.6 cm ascending thoracic aortic aneurysm and cholelithiasis incidentally noted.   Electronically Signed   By: Earle Gell M.D.   On: 04/09/2014 09:04   Ct Angio Chest W/cm &/or Wo Cm  03/13/2014   CLINICAL  DATA:  Thoracic aortic aneurysm  EXAM: CT ANGIOGRAPHY CHEST WITH CONTRAST  TECHNIQUE: Multidetector CT imaging of the chest was performed using the standard protocol during bolus administration of intravenous contrast. Multiplanar CT image reconstructions and MIPs were obtained to evaluate the vascular anatomy.  CONTRAST:  127mL OMNIPAQUE IOHEXOL 350 MG/ML SOLN  COMPARISON:  03/13/2013  FINDINGS: The heart size is normal. There is no pericardial effusion. There is adequate opacification of the thoracic aorta. The ascending aorta at the level of the right main pulmonary artery measures 5.1 cm. The proximal aortic Large measures 5 cm in greatest transverse dimension. The descending thoracic aorta is normal in caliber. There are aortic valvular calcifications. There is no aortic dissection.  The main pulmonary artery, right main pulmonary artery and left main pulmonary arteries are normal in size.  There is no pleural effusion or pneumothorax. There is a new left  lower lobe bilobed pulmonary nodule measuring 2.8 x 2.2 cm in greatest dimension.  There is no axillary, hilar, or mediastinal adenopathy.  There is no lytic or blastic osseous lesion. The multiple lower thoracic spine Schmorl's nodes with mild anterior chronic wedge compression deformities of the vertebral bodies. This appearance can be seen in the setting of Scheuermann's disease.  The visualized portions of the upper abdomen are unremarkable.  Review of the MIP images confirms the above findings.  IMPRESSION: 1. New left lower lobe bilobed pulmonary nodule measuring 2.8 x 2.2 cm in greatest dimension. These results will be called to the ordering clinician or representative by the Radiologist Assistant, and communication documented in the PACS or zVision Dashboard. 2. Stable 5.1 cm ascending aortic aneurysm. 3. Aortic valvular calcification.   Electronically Signed   By: Kathreen Devoid   On: 03/13/2014 09:35      Recent Lab Findings: Lab Results  Component Value Date   WBC 7.1 11/21/2013   HGB 15.1 11/21/2013   HCT 46.0 11/21/2013   PLT 190.0 11/21/2013   GLUCOSE 100* 02/26/2014   CHOL 140 02/27/2013   TRIG 121.0 02/27/2013   HDL 34.80* 02/27/2013   LDLCALC 81 02/27/2013   ALT 27 12/02/2011   AST 26 12/02/2011   NA 139 02/26/2014   K 4.1 02/26/2014   CL 105 02/26/2014   CREATININE 1.1 02/26/2014   BUN 26* 02/26/2014   CO2 28 02/26/2014   TSH 1.28 12/02/2011   Aortic Size Index=      5.2   /Body surface area is 2.12 meters squared. = 2.45  < 2.75 cm/m2      4% risk per year 2.75 to 4.25          8% risk per year > 4.25 cm/m2    20% risk per year  PFT's FEV1 2.54 73 %  DLCO 31.88 94%    Assessment / Plan:   #1 new left lower lobe bilobed pulmonary nodule measuring 2.8 x 2.2 cm- possible clinical stage I lung carcinoma,  lobulated nodule in the left lower lobe shows low-grade metabolic activity with SUV max of 1.7, suspicious for low-grade adenocarcinoma on PET Scan #2 severe aortic stenosis #3 significantly  dilated ascending aorta 5.2-5.3 cm #4 adult onset asthma- x30 years  I reviewed the current significant diagnoses. I've recommended to the patient that we proceed with obtaining a tissue dx with ct guided needle bx. I will see him back after needle bx  He has not had a heart catheterization in the past and will need that before any cardiac surgery and possibly  before any lung surgery .   [name]  Grace Isaac MD      [address] [place] [address] Office [phone]   Beeper [phone]  04/09/2014 4:25 PM

## 2014-04-15 ENCOUNTER — Encounter (HOSPITAL_COMMUNITY): Payer: Self-pay | Admitting: Pharmacy Technician

## 2014-04-16 ENCOUNTER — Other Ambulatory Visit: Payer: Self-pay | Admitting: Radiology

## 2014-04-16 ENCOUNTER — Ambulatory Visit: Payer: Medicare HMO | Admitting: Family Medicine

## 2014-04-17 ENCOUNTER — Other Ambulatory Visit (HOSPITAL_COMMUNITY): Payer: Medicare HMO

## 2014-04-17 ENCOUNTER — Ambulatory Visit (HOSPITAL_COMMUNITY)
Admission: RE | Admit: 2014-04-17 | Discharge: 2014-04-17 | Disposition: A | Discharge status: Home / Self Care | Payer: Medicare HMO | Source: Ambulatory Visit | Attending: Cardiothoracic Surgery | Admitting: Cardiothoracic Surgery

## 2014-04-17 ENCOUNTER — Encounter (HOSPITAL_COMMUNITY): Payer: Self-pay

## 2014-04-17 VITALS — BP 125/83 | HR 77 | Temp 97.0°F | Resp 16 | Ht 72.0 in | Wt 195.0 lb

## 2014-04-17 DIAGNOSIS — J8410 Pulmonary fibrosis, unspecified: Secondary | ICD-10-CM | POA: Insufficient documentation

## 2014-04-17 DIAGNOSIS — I712 Thoracic aortic aneurysm, without rupture, unspecified: Secondary | ICD-10-CM | POA: Insufficient documentation

## 2014-04-17 DIAGNOSIS — I359 Nonrheumatic aortic valve disorder, unspecified: Secondary | ICD-10-CM | POA: Diagnosis present

## 2014-04-17 DIAGNOSIS — R911 Solitary pulmonary nodule: Secondary | ICD-10-CM

## 2014-04-17 DIAGNOSIS — J95811 Postprocedural pneumothorax: Secondary | ICD-10-CM

## 2014-04-17 LAB — CBC
HCT: 41 % (ref 39.0–52.0)
HEMOGLOBIN: 13.7 g/dL (ref 13.0–17.0)
MCH: 31.4 pg (ref 26.0–34.0)
MCHC: 33.4 g/dL (ref 30.0–36.0)
MCV: 93.8 fL (ref 78.0–100.0)
PLATELETS: 158 10*3/uL (ref 150–400)
RBC: 4.37 MIL/uL (ref 4.22–5.81)
RDW: 13.3 % (ref 11.5–15.5)
WBC: 6.9 10*3/uL (ref 4.0–10.5)

## 2014-04-17 LAB — APTT: aPTT: 26 seconds (ref 24–37)

## 2014-04-17 LAB — PROTIME-INR
INR: 0.96 (ref 0.00–1.49)
Prothrombin Time: 12.8 seconds (ref 11.6–15.2)

## 2014-04-17 MED ORDER — MIDAZOLAM HCL 2 MG/2ML IJ SOLN
INTRAMUSCULAR | Status: AC
  Filled 2014-04-17: qty 4

## 2014-04-17 MED ORDER — LIDOCAINE HCL 1 % IJ SOLN
INTRAMUSCULAR | Status: AC
  Filled 2014-04-17: qty 10

## 2014-04-17 MED ORDER — SODIUM CHLORIDE 0.9 % IV SOLN: INTRAVENOUS | Status: DC

## 2014-04-17 MED ORDER — MIDAZOLAM HCL 2 MG/2ML IJ SOLN
INTRAMUSCULAR | Status: AC | PRN
  Administered 2014-04-17: 2 mg via INTRAVENOUS

## 2014-04-17 MED ORDER — FENTANYL CITRATE 0.05 MG/ML IJ SOLN
INTRAMUSCULAR | Status: AC
  Filled 2014-04-17: qty 4

## 2014-04-17 MED ORDER — FENTANYL CITRATE 0.05 MG/ML IJ SOLN
INTRAMUSCULAR | Status: AC | PRN
  Administered 2014-04-17 (×2): 50 ug via INTRAVENOUS

## 2014-04-17 MED ORDER — HYDROCODONE-ACETAMINOPHEN 5-325 MG PO TABS
1.0000 | ORAL_TABLET | ORAL | Status: DC | PRN
  Filled 2014-04-17: qty 2

## 2014-04-17 NOTE — Progress Notes (Signed)
Pt discharge information given to MD order.  Pt and Cg able to verbalize understanding.  Pt denies any discomfort at this time.  Pt to car via wheelchair.

## 2014-04-17 NOTE — Progress Notes (Signed)
Pt received from  Radiology alert, and able to void on arrival.  Pt denies any discomfort at this time.  Dressing to left upper back dry and intact.

## 2014-04-17 NOTE — Discharge Instructions (Signed)
Lung Biopsy °A lung biopsy is a procedure in which a tissue sample is removed from the lung. The tissue can be examined under a microscope to help diagnose various lung disorders.  °LET YOUR HEALTH CARE PROVIDER KNOW ABOUT: °· Any allergies you have. °· All medicines you are taking, including vitamins, herbs, eye drops, creams, and over-the-counter medicines. °· Previous problems you or members of your family have had with the use of anesthetics. °· Any blood disorders or bleeding problems that you have. °· Previous surgeries you have had. °· Medical conditions you have. °RISKS AND COMPLICATIONS °Generally, a lung biopsy is a safe procedure. However, as with any procedure, complications can occur. Possible complications include: °· Collapse of the lung.   °· Bleeding.   °· Infection.   °BEFORE THE PROCEDURE °· Do not eat or drink anything for 8 hours before the procedure or as directed by your health care provider. °· Ask your health care provider if you need to change or stop taking your regular medicines before the procedure. °· Arrange for someone to drive you home after the procedure. °PROCEDURE °Various methods can be used to perform a lung biopsy:  °· Needle biopsy: A biopsy needle is inserted into the lung. The needle is used to collect the tissue sample. A CT scanner may be used to guide the needle to the right place in the lung. For this method, a medicine is used to numb the area where the biopsy sample will be taken (local anesthetic). °· Bronchoscopy: A flexible tube (bronchoscope) is inserted into your lungs by going through your mouth or nose. A needle or forceps is passed through the bronchoscope to remove the tissue sample. For this method, medicine may be used to numb the back of your throat. °· Open biopsy: A cut (incision) is made in your chest. The tissue sample is then removed using surgical tools. The incision is closed with skin glue, skin adhesive strips, or stitches. For this method, you  will be given medicine to make you sleep through the procedure (general anesthetic). °AFTER THE PROCEDURE °Your recovery will be assessed and monitored. For the needle or bronchoscope method, you may be allowed to go home on the day of your procedure as soon as you are stable. For the open biopsy method, you may need to stay in the hospital overnight for observation. You might have soreness and tenderness at the site of the biopsy for a few days after the procedure. You might have a cough and some soreness in your throat for a few days if a bronchoscope was used. °Document Released: 12/02/2004 Document Revised: 05/16/2013 Document Reviewed: 02/25/2013 °Exit[employer]® Patient Information ©2015 [employer]. This information is not intended to replace advice given to you by your health care provider. Make sure you discuss any questions you have with your health care provider. ° °

## 2014-04-17 NOTE — Sedation Documentation (Signed)
Wife at bedside. Pt awake and stable.

## 2014-04-17 NOTE — Procedures (Signed)
CT biopsy LLL nodule 18g x2 No ptx. No complication No blood loss. See complete dictation in [facility].

## 2014-04-17 NOTE — H&P (Signed)
Jeremy Meadows is an 68 y.o. male.   Chief Complaint: Pt was discovered to have new aortic stenosis per murmur on annual physical exam over 1 yr ago. Work up included CT scan--which revealed thoracic aorta aneurysm Follow up CT a yr later shows new left lung mass +PET 04/09/14 Now scheduled for biopsy of same  HPI: glaucoma; Ao valve stenosis; HLD; renal stone; OSA  Past Medical History  Diagnosis Date  . Unspecified glaucoma   . Allergic rhinitis, cause unspecified   . Acute bronchitis   . Aortic valve stenosis   . Pure hypercholesterolemia   . Benign neoplasm of colon   . Calculus of kidney   . Other testicular hypofunction   . Aortic insufficiency   . Aortic root enlargement   . Heart murmur 2014  . Obstructive sleep apnea (adult) (pediatric)     pt. doesn't use his machine at home.    Past Surgical History  Procedure Laterality Date  . Rhinoplasty  1980s  . Knee surgery Right 05/2012    arthroscopic  . Medial partial knee replacement Right 03/2013  . Right knee-  Right January 11, 2014    knee cap- scrabbed  . Lithotripsy      Family History  Problem Relation Age of Onset  . Colon cancer Neg Hx   . Heart disease Brother   . Heart disease Brother   . Heart disease Brother    Social History:  reports that he has never smoked. He has never used smokeless tobacco. He reports that he does not drink alcohol or use illicit drugs.  Allergies: No Known Allergies   (Not in a hospital admission)  Results for orders placed during the hospital encounter of 04/17/14 (from the past 48 hour(s))  APTT     Status: None   Collection Time    04/17/14  7:24 AM      Result Value Ref Range   aPTT 26  24 - 37 seconds  CBC     Status: None   Collection Time    04/17/14  7:24 AM      Result Value Ref Range   WBC 6.9  4.0 - 10.5 K/uL   RBC 4.37  4.22 - 5.81 MIL/uL   Hemoglobin 13.7  13.0 - 17.0 g/dL   HCT 41.0  39.0 - 52.0 %   MCV 93.8  78.0 - 100.0 fL   MCH 31.4  26.0 - 34.0  pg   MCHC 33.4  30.0 - 36.0 g/dL   RDW 13.3  11.5 - 15.5 %   Platelets 158  150 - 400 K/uL  PROTIME-INR     Status: None   Collection Time    04/17/14  7:24 AM      Result Value Ref Range   Prothrombin Time 12.8  11.6 - 15.2 seconds   INR 0.96  0.00 - 1.49   No results found.  Review of Systems  Constitutional: Negative for fever and weight loss.  Respiratory: Negative for cough and shortness of breath.   Cardiovascular: Negative for chest pain.  Gastrointestinal: Negative for nausea, vomiting and abdominal pain.  Musculoskeletal: Negative for back pain.  Neurological: Negative for dizziness, weakness and headaches.  Psychiatric/Behavioral: Negative for substance abuse.    Blood pressure 130/99, pulse 86, temperature 98 F (36.7 C), temperature source Oral, resp. rate 20, height 6' (1.829 m), weight 88.451 kg (195 lb), SpO2 97.00%. Physical Exam  Constitutional: He is oriented to person, place, and time. He  appears well-developed and well-nourished.  Cardiovascular: Normal rate and regular rhythm.   Murmur heard. Respiratory: Effort normal and breath sounds normal. He has no wheezes.  GI: Soft. Bowel sounds are normal. There is no tenderness.  Neurological: He is alert and oriented to person, place, and time.  Skin: Skin is warm and dry.  Psychiatric: He has a normal mood and affect. His behavior is normal. Judgment and thought content normal.     Assessment/Plan LLL mass ; +PET Scheduled for bx Pt aware of procedure benefits and risks and agreeable to proceed Consent signed and in chart  Arling Cerone A 04/17/2014, 8:10 AM

## 2014-04-19 ENCOUNTER — Encounter: Payer: Self-pay | Admitting: Cardiothoracic Surgery

## 2014-04-19 ENCOUNTER — Ambulatory Visit (INDEPENDENT_AMBULATORY_CARE_PROVIDER_SITE_OTHER): Payer: Commercial Managed Care - HMO | Admitting: Cardiothoracic Surgery

## 2014-04-19 VITALS — BP 135/95 | HR 100 | Resp 18 | Ht 72.0 in | Wt 195.0 lb

## 2014-04-19 DIAGNOSIS — Z9889 Other specified postprocedural states: Secondary | ICD-10-CM

## 2014-04-19 DIAGNOSIS — D381 Neoplasm of uncertain behavior of trachea, bronchus and lung: Secondary | ICD-10-CM

## 2014-04-19 NOTE — Progress Notes (Signed)
[address]       [place] [address]             [phone]                    Jeremy Meadows [facility] Medical Record #[id number] Date of Birth: [birth year]  Referring: [name], Peter M, MD Primary Care: Noralee Space, MD  Chief Complaint:    Chief Complaint  Patient presents with  . Routine Post Op    S/O CT BX on 04/17/14, discuss path results    History of Present Illness:    Jeremy Meadows 68 y.o. male is seen in the office  today for followup after CT guided needle biopsy of new bilobular left lower lobe lung lesion. Needle biopsy reveals:  Lung, needle/core biopsy(ies), LLL - NONCASEATING GRANULOMATOUS INFLAMMATION. - FIBROBLASTIC FOCI. - THERE IS NO EVIDENCE OF MALIGNANCY. - SEE COMMENT. Microscopic Comment The differential diagnosis includes sarcoidosis. Malignant features are not identified. Clinical and radiologic correlation is recommended.   Most exactly a year ago the patient was noted by Dr. Lenna Gilford  have a murmur. Evaluation for this included a CT scan of the chest which demonstrated a 5.1 cm dilated ascending aorta, echocardiogram showed severe aortic stenosis with a peak velocity across the aortic valve of 396 mean gradient of 41 and peak gradient of 65. The patient return one year later to see Dr. Peter [name] CT scan of the chest was done to measure the size of his aorta and a new left lower lobe lung nodule was noted. The patient was referred to thoracic surgery for workup of the lung nodule. The patient has had pulmonary function studies done . He does have a history of adult onset asthma , for which he uses bronchodilators intermittently and has for 25 or 30 years. Currently he is on albuterol as a rescue inhaler he does note that his symptoms have been getting worse and is using the inhaler 6 or 8 times a day. In addition he notes that he wakes up at night short of breath and wheezing.   The patient's never been a smoker, retired  from [employer], no history of asbestos exposure.  The patient denies syncope denies angina denies presyncope, he does wake up at night wheezing, he does note shortness of breath with exertion. He's had no previous heart catheterization for documentation of coronary artery disease. He denies diabetes does have hypertension and takes a statin for lipid treatment.     Current Activity/ Functional Status:  Patient is independent with mobility/ambulation, transfers, ADL's, IADL's.   Zubrod Score: At the time of surgery this patient's most appropriate activity status/level should be described as: [x]     0    Normal activity, no symptoms []     1    Restricted in physical strenuous activity but ambulatory, able to do out light work []     2    Ambulatory and capable of self care, unable to do work activities, up and about               >50 % of waking hours                              []     3    Only limited self care, in bed greater than 50% of waking hours []     4  Completely disabled, no self care, confined to bed or chair []     5    Moribund   Past Medical History  Diagnosis Date  . Unspecified glaucoma   . Allergic rhinitis, cause unspecified   . Acute bronchitis   . Aortic valve stenosis   . Pure hypercholesterolemia   . Benign neoplasm of colon   . Calculus of kidney   . Other testicular hypofunction   . Aortic insufficiency   . Aortic root enlargement   . Heart murmur 2014  . Obstructive sleep apnea (adult) (pediatric)     pt. doesn't use his machine at home.    Past Surgical History  Procedure Laterality Date  . Rhinoplasty  1980s  . Knee surgery Right 05/2012    arthroscopic  . Medial partial knee replacement Right 03/2013  . Right knee-  Right January 11, 2014    knee cap- scrabbed  . Lithotripsy      Family History  Problem Relation Age of Onset  . Colon cancer Neg Hx   . Heart disease Brother   . Heart disease Brother   . Heart disease Brother     patient's family history significant for his father died at age 36 suddenly, told it was a heart attack but no confirmation of this, mother died in her 24s of carcinoma of the jaw Patient has 3 brothers the oldest has had carotid artery disease, no Brother coronary artery bypass grafting, youngest brother has had coronary stents most recently 3 weeks ago. Patient has 2 sons are healthy     History  Smoking status  . Never Smoker   Smokeless tobacco  . Never Used    History  Alcohol Use No     No Known Allergies  Current Outpatient Prescriptions  Medication Sig Dispense Refill  . albuterol (VENTOLIN HFA) 108 (90 BASE) MCG/ACT inhaler Inhale 2 puffs into the lungs every 6 (six) hours as needed for wheezing or shortness of breath.  1 Inhaler  0  . amLODipine (NORVASC) 5 MG tablet Take 5 mg by mouth every evening.      . brinzolamide (AZOPT) 1 % ophthalmic suspension Place 1 drop into both eyes 2 (two) times daily.      . cholecalciferol (VITAMIN D) 1000 UNITS tablet Take 1,000 Units by mouth daily.      [name] MELATONIN PO Take 6 mg by mouth at bedtime.      . Multiple Vitamin (MULTIVITAMIN) capsule Take 1 capsule by mouth daily.      . simvastatin (ZOCOR) 20 MG tablet Take 1 tablet (20 mg total) by mouth every evening.  90 tablet  3  . vitamin C (ASCORBIC ACID) 500 MG tablet Take 500 mg by mouth 2 (two) times daily.       No current facility-administered medications for this visit.     Review of Systems:     Cardiac Review of Systems: Y or N  Chest Pain [   n ]  Resting SOB [  n ] Exertional SOB  [y with wheezing  ]  Orthopnea [y]   Pedal Edema [ n  ]    Palpitations [  n] Syncope  [ n ]   Presyncope [ n  ]  General Review of Systems: [Y] = yes [  ]=no Constitional: recent weight change [n  ];  Wt loss over the last 3 months [   ] anorexia [  ]; fatigue [  ]; nausea [  ]; night  sweats [  ]; fever [  ]; or chills [  ];          Dental: poor dentition[  ]; Last Dentist visit:   Eye :  blurred vision [  ]; diplopia [   ]; vision changes [  ];  Amaurosis fugax[  ]; Resp: cough [  ];  wheezing[ y ];  hemoptysis[ n ]; shortness of breath[ n ]; paroxysmal nocturnal dyspnea[n  ]; dyspnea on exertion[y  ]; or orthopnea[ n ];  GI:  gallstones[  ], vomiting[n  ];  dysphagia[  ]; melena[  ];  hematochezia [n  ]; heartburn[  ];   Hx of  Colonoscopy[ y ]; GU: kidney stones [  ]; hematuria[  ];   dysuria [  ];  nocturia[  ];  history of     obstruction [  ]; urinary frequency [  ]             Skin: rash, swelling[  ];, hair loss[  ];  peripheral edema[  ];  or itching[  ]; Musculosketetal: myalgias[  ];  joint swelling[  ];  joint erythema[  ];  joint pain[  ];  back pain[  ];  Heme/Lymph: bruising[  ];  bleeding[  ];  anemia[  ];  Neuro: TIA[ n ];  headaches[ n ];  stroke[  ];  vertigo[  ];  seizures[n  ];   paresthesias[  ];  difficulty walking[n  ];  Psych:depression[  ]; anxiety[  ];  Endocrine: diabetes[  n];  thyroid dysfunction[n  ];  Immunizations: Flu up to date [  ]; Pneumococcal up to date [  ];  Other:  Physical Exam: BP 135/95  Pulse 100  Resp 18  Ht 6' (1.829 m)  Wt 195 lb (88.451 kg)  BMI 26.44 kg/m2  SpO2 98%  PHYSICAL EXAMINATION:  General appearance: alert, cooperative, appears stated age and no distress Neurologic: intact Heart: systolic murmur: holosystolic 3/6, crescendo throughout the precordium Lungs: clear to auscultation bilaterally Abdomen: soft, non-tender; bowel sounds normal; no masses,  no organomegaly Extremities: extremities normal, atraumatic, no cyanosis or edema and Homans sign is negative, no sign of DVT Patient has no cervical or supraclavicular adenopathy, he has full pedal pulses DP and PT bilaterally, he has no carotid bruits    Diagnostic Studies & Laboratory data:     Recent Radiology Findings:  Nm Pet Image Initial (pi) Skull Base To Thigh  04/09/2014   CLINICAL DATA:  Initial treatment strategy for left lung nodule.  EXAM:  NUCLEAR MEDICINE PET SKULL BASE TO THIGH  TECHNIQUE: 9.6 mCi F-18 FDG was injected intravenously. Full-ring PET imaging was performed from the skull base to thigh after the radiotracer. CT data was obtained and used for attenuation correction and anatomic localization.  FASTING BLOOD GLUCOSE:  Value: 97 mg/dl  COMPARISON:  Chest CT on 03/13/2014  FINDINGS: NECK  No hypermetabolic lymph nodes in the neck.  CHEST  No hypermetabolic mediastinal or hilar nodes. 2.1 cm lobulated nodule in the lateral aspect of the left lower lobe remains stable in size and appearance on CT and shows low-grade metabolic activity, with SUV max of 1.7. This is suspicious for low grade adenocarcinoma. No other pulmonary nodules identified on CT.  At ascending thoracic aortic aneurysm is seen measuring 5.6 cm in diameter. Asymmetric muscular activity noted in right shoulder girdle.  ABDOMEN/PELVIS  No abnormal hypermetabolic activity within the liver, pancreas, adrenal glands, or spleen. No hypermetabolic lymph nodes  in the abdomen or pelvis. Small calcified gallstones incidentally noted.  SKELETON  No focal hypermetabolic activity to suggest skeletal metastasis.  IMPRESSION: 2.1 cm lobulated nodule in the left lower lobe shows low-grade metabolic activity with SUV max of 1.7, suspicious for low-grade adenocarcinoma.  No evidence of nodal or distant metastatic disease.  5.6 cm ascending thoracic aortic aneurysm and cholelithiasis incidentally noted.   Electronically Signed   By: Earle Gell M.D.   On: 04/09/2014 09:04   Ct Angio Chest W/cm &/or Wo Cm  03/13/2014   CLINICAL DATA:  Thoracic aortic aneurysm  EXAM: CT ANGIOGRAPHY CHEST WITH CONTRAST  TECHNIQUE: Multidetector CT imaging of the chest was performed using the standard protocol during bolus administration of intravenous contrast. Multiplanar CT image reconstructions and MIPs were obtained to evaluate the vascular anatomy.  CONTRAST:  183mL OMNIPAQUE IOHEXOL 350 MG/ML SOLN   COMPARISON:  03/13/2013  FINDINGS: The heart size is normal. There is no pericardial effusion. There is adequate opacification of the thoracic aorta. The ascending aorta at the level of the right main pulmonary artery measures 5.1 cm. The proximal aortic Large measures 5 cm in greatest transverse dimension. The descending thoracic aorta is normal in caliber. There are aortic valvular calcifications. There is no aortic dissection.  The main pulmonary artery, right main pulmonary artery and left main pulmonary arteries are normal in size.  There is no pleural effusion or pneumothorax. There is a new left lower lobe bilobed pulmonary nodule measuring 2.8 x 2.2 cm in greatest dimension.  There is no axillary, hilar, or mediastinal adenopathy.  There is no lytic or blastic osseous lesion. The multiple lower thoracic spine Schmorl's nodes with mild anterior chronic wedge compression deformities of the vertebral bodies. This appearance can be seen in the setting of Scheuermann's disease.  The visualized portions of the upper abdomen are unremarkable.  Review of the MIP images confirms the above findings.  IMPRESSION: 1. New left lower lobe bilobed pulmonary nodule measuring 2.8 x 2.2 cm in greatest dimension. These results will be called to the ordering clinician or representative by the Radiologist Assistant, and communication documented in the PACS or zVision Dashboard. 2. Stable 5.1 cm ascending aortic aneurysm. 3. Aortic valvular calcification.   Electronically Signed   By: Kathreen Devoid   On: 03/13/2014 09:35      Recent Lab Findings: Lab Results  Component Value Date   WBC 6.9 04/17/2014   HGB 13.7 04/17/2014   HCT 41.0 04/17/2014   PLT 158 04/17/2014   GLUCOSE 100* 02/26/2014   CHOL 140 02/27/2013   TRIG 121.0 02/27/2013   HDL 34.80* 02/27/2013   LDLCALC 81 02/27/2013   ALT 27 12/02/2011   AST 26 12/02/2011   NA 139 02/26/2014   K 4.1 02/26/2014   CL 105 02/26/2014   CREATININE 1.1 02/26/2014   BUN 26* 02/26/2014   CO2 28  02/26/2014   TSH 1.28 12/02/2011   INR 0.96 04/17/2014   Aortic Size Index=      5.2   /Body surface area is 2.12 meters squared. = 2.45  < 2.75 cm/m2      4% risk per year 2.75 to 4.25          8% risk per year > 4.25 cm/m2    20% risk per year  PFT's FEV1 2.54 73 %  DLCO 31.88 94% done July 2015   Assessment / Plan:   #1 new left lower lobe bilobed pulmonary nodule measuring 2.8 x 2.2  cm-by needle/core biopsy  - NONCASEATING GRANULOMATOUS INFLAMMATION. - FIBROBLASTIC FOCI. - THERE IS NO EVIDENCE OF MALIGNANCY. -differential diagnosis includes sarcoidosis  #2 severe aortic stenosis- it is unclear if the patient's worsening asthmatic symptoms are really related to "cardiac asthma" #3 significantly dilated ascending aorta 5.2-5.3 cm #4 adult onset asthma- x30 years - with increasing use of rescue inhaler 6-8 times a day and worsening at night   He has not had a heart catheterization in the past and will need that before any cardiac surgery and possibly before any lung surgery . I've asked patient to return to see Dr. Peter [name] did discuss consideration for aortic valve replacement and ascending aorta, prior to this he'll need cardiac catheterization Because of the increasing symptoms of "asthma" and increasing need to use a rescue inhaler I asked patient to make a return appointment to see pulmonary service I plan to see the patient back in 3-4 weeks to further discuss with him treatment plans including possible aortic valve replacement and replacement of ascending aorta   Grace Isaac MD      [address] [place] [address] Office [phone]   Beeper [phone]  04/19/2014 4:45 PM

## 2014-04-24 ENCOUNTER — Encounter: Payer: Self-pay | Admitting: Family Medicine

## 2014-04-24 ENCOUNTER — Ambulatory Visit (INDEPENDENT_AMBULATORY_CARE_PROVIDER_SITE_OTHER): Payer: Commercial Managed Care - HMO | Admitting: Family Medicine

## 2014-04-24 VITALS — BP 136/96 | HR 84 | Temp 97.9°F | Ht 71.75 in | Wt 200.2 lb

## 2014-04-24 DIAGNOSIS — I359 Nonrheumatic aortic valve disorder, unspecified: Secondary | ICD-10-CM

## 2014-04-24 DIAGNOSIS — J4540 Moderate persistent asthma, uncomplicated: Secondary | ICD-10-CM

## 2014-04-24 DIAGNOSIS — R911 Solitary pulmonary nodule: Secondary | ICD-10-CM

## 2014-04-24 DIAGNOSIS — R030 Elevated blood-pressure reading, without diagnosis of hypertension: Secondary | ICD-10-CM

## 2014-04-24 DIAGNOSIS — I10 Essential (primary) hypertension: Secondary | ICD-10-CM

## 2014-04-24 DIAGNOSIS — J45909 Unspecified asthma, uncomplicated: Secondary | ICD-10-CM

## 2014-04-24 DIAGNOSIS — I350 Nonrheumatic aortic (valve) stenosis: Secondary | ICD-10-CM

## 2014-04-24 DIAGNOSIS — I712 Thoracic aortic aneurysm, without rupture, unspecified: Secondary | ICD-10-CM

## 2014-04-24 DIAGNOSIS — E7800 Pure hypercholesterolemia, unspecified: Secondary | ICD-10-CM

## 2014-04-24 NOTE — Assessment & Plan Note (Signed)
S/p nonmalignant biopsy by IR.

## 2014-04-24 NOTE — Assessment & Plan Note (Signed)
Followed by cards/CTsurgery. Appreciate their care.

## 2014-04-24 NOTE — Patient Instructions (Addendum)
Good to meet you today, call us with quesitons. Call your insurance about the shingles shot to see if it is covered or how much it would cost and where is cheaper (here or pharmacy).  If you want to receive here, call for nurse visit.  No med changes today. Schedule medicare wellness visit with fasting blood work prior in 3-4 months.

## 2014-04-24 NOTE — Assessment & Plan Note (Signed)
Stenosis with insufficiency and TAA - has f/u planned with cards, and cardiothoracic surgery.

## 2014-04-24 NOTE — Assessment & Plan Note (Signed)
Compliant with zocor - continue

## 2014-04-24 NOTE — Assessment & Plan Note (Signed)
Pending appt with Jeremy Meadows this week for further evaluation of wheezing - ?asthma related vs cardiac asthma from CHF Recent PFTs 03/2014 - moderate central airway obstructive disease - FVC 79%, FEV1 pre 73% and post 85%, ratio 0.68 Over-utilizing albuterol. Did not start controller medication today. Will await pulm eval.

## 2014-04-24 NOTE — Progress Notes (Signed)
BP 136/96  Pulse 84  Temp(Src) 97.9 F (36.6 C) (Oral)  Ht 5' 11.75" (1.822 m)  Wt 200 lb 4 oz (90.833 kg)  BMI 27.36 kg/m2   CC: transfer of care from Dr. Lenna Gilford  Subjective:    Patient ID: Jeremy Meadows, male    DOB: Aug 26, 1946, 68 y.o.   MRN: [id number]  HPI: Jeremy Meadows is a 68 y.o. male presenting on 04/24/2014 for [place] complicated pt presents to establish today. Transfer from Dr. Lenna Gilford.  New heart murmur found last year - referred to Dr [name] with echo and contrasted CT scan showing AS, AI and 5.1cm TAA. Planning on return to cards to discuss catheterization and then return to CT surgery to discuss aortic valve repair/replacement.  Recent pulm nodule scare - biopsy by IR consistent with non-malignant process - possible sarcoidosis.   Has been referred to pulm Tammy Parrett for ongoing wheezing over last 2 months. Only sleeping 2-3 hours nightly 2/2 dyspnea/wheezing. + orthopnea and PNdyspnea.  Sleeps on 2 pillows not on recliner. Never smoker. H/o asthma, currently only an alb inhaler - yesterday used 5-6 times and this is norm over last 2 months.   Recently treated for abx by Dr. Jeannine Kitten office for presumed bronchitis.  HTN - on amlodipine 5mg  daily, noticing some elevated readings at home as well.   H/o vitiligo. H/o kidney stones. H/o R knee surgery for arthritis. H/o hypertension in eyes "but not glaucoma" (Dr. Nicki Reaper) PFTs normal prior to lung biopsy. Main frustration is not able to play softball 2/2 dyspnea.  Preventative: No recent CPE Flu shot 06/2013 Pneumovax 2013 Tdap 11/2011  Relevant past medical, surgical, family and social history reviewed and updated as indicated.  Allergies and medications reviewed and updated. Current Outpatient Prescriptions on File Prior to Visit  Medication Sig  . albuterol (VENTOLIN HFA) 108 (90 BASE) MCG/ACT inhaler Inhale 2 puffs into the lungs every 6 (six) hours as needed for wheezing or shortness  of breath.  [name] amLODipine (NORVASC) 5 MG tablet Take 5 mg by mouth every evening.  . brinzolamide (AZOPT) 1 % ophthalmic suspension Place 1 drop into both eyes 2 (two) times daily.  . cholecalciferol (VITAMIN D) 1000 UNITS tablet Take 1,000 Units by mouth daily.  [name] MELATONIN PO Take 6 mg by mouth at bedtime.  . Multiple Vitamin (MULTIVITAMIN) capsule Take 1 capsule by mouth daily.  . simvastatin (ZOCOR) 20 MG tablet Take 1 tablet (20 mg total) by mouth every evening.  . vitamin C (ASCORBIC ACID) 500 MG tablet Take 500 mg by mouth 2 (two) times daily.   No current facility-administered medications on file prior to visit.    Review of Systems Per HPI unless specifically indicated above    Objective:    BP 136/96  Pulse 84  Temp(Src) 97.9 F (36.6 C) (Oral)  Ht 5' 11.75" (1.822 m)  Wt 200 lb 4 oz (90.833 kg)  BMI 27.36 kg/m2  Physical Exam  Nursing note and vitals reviewed. Constitutional: He appears well-developed and well-nourished. No distress.  HENT:  Mouth/Throat: Oropharynx is clear and moist. No oropharyngeal exudate.  Eyes: Conjunctivae and EOM are normal. Pupils are equal, round, and reactive to light. No scleral icterus.  Cardiovascular: Normal rate, regular rhythm and intact distal pulses.   Murmur (3/6 SEM throughout) heard. Pulmonary/Chest: Effort normal. No respiratory distress. He has wheezes (mild exp wheezing mainly LUL). He has no rales.  Musculoskeletal: He exhibits no edema.  Skin:  Skin is warm and dry.  Vitiligo throughout skin   Results for orders placed during the hospital encounter of 04/17/14  APTT      Result Value Ref Range   aPTT 26  24 - 37 seconds  CBC      Result Value Ref Range   WBC 6.9  4.0 - 10.5 K/uL   RBC 4.37  4.22 - 5.81 MIL/uL   Hemoglobin 13.7  13.0 - 17.0 g/dL   HCT 41.0  39.0 - 52.0 %   MCV 93.8  78.0 - 100.0 fL   MCH 31.4  26.0 - 34.0 pg   MCHC 33.4  30.0 - 36.0 g/dL   RDW 13.3  11.5 - 15.5 %   Platelets 158  150 - 400 K/uL    PROTIME-INR      Result Value Ref Range   Prothrombin Time 12.8  11.6 - 15.2 seconds   INR 0.96  0.00 - 1.49      Assessment & Plan:   Problem List Items Addressed This Visit   Thoracic aortic aneurysm     Followed by cards/CTsurgery. Appreciate their care.    Pulmonary nodule, left     S/p nonmalignant biopsy by IR.    HYPERCHOLESTEROLEMIA     Compliant with zocor - continue    HTN (hypertension)     Compliant with amlodipine 5mg  daily. Continue to monitor, consider increase if not controlled next visit.    Asthmatic bronchitis     Pending appt with Tammy Parrett this week for further evaluation of wheezing - ?asthma related vs cardiac asthma from CHF Recent PFTs 03/2014 - moderate central airway obstructive disease - FVC 79%, FEV1 pre 73% and post 85%, ratio 0.68 Over-utilizing albuterol. Did not start controller medication today. Will await pulm eval.    Aortic valve stenosis - Primary     Stenosis with insufficiency and TAA - has f/u planned with cards, and cardiothoracic surgery.     Other Visit Diagnoses   ELEVATED BLOOD PRESSURE            Follow up plan: Return in about 3 months (around 07/25/2014), or as needed, for annual exam, prior fasting for blood work.

## 2014-04-24 NOTE — Progress Notes (Signed)
Pre visit review using our clinic review tool, if applicable. No additional management support is needed unless otherwise documented below in the visit note. 

## 2014-04-24 NOTE — Assessment & Plan Note (Signed)
Compliant with amlodipine 5mg  daily. Continue to monitor, consider increase if not controlled next visit.

## 2014-04-26 ENCOUNTER — Other Ambulatory Visit: Payer: Commercial Managed Care - HMO

## 2014-04-26 ENCOUNTER — Encounter: Payer: Self-pay | Admitting: Adult Health

## 2014-04-26 ENCOUNTER — Ambulatory Visit (INDEPENDENT_AMBULATORY_CARE_PROVIDER_SITE_OTHER): Payer: Commercial Managed Care - HMO | Admitting: Adult Health

## 2014-04-26 VITALS — BP 154/108 | HR 79 | Temp 98.2°F | Ht 72.0 in | Wt 200.4 lb

## 2014-04-26 DIAGNOSIS — J4540 Moderate persistent asthma, uncomplicated: Secondary | ICD-10-CM

## 2014-04-26 DIAGNOSIS — J45909 Unspecified asthma, uncomplicated: Secondary | ICD-10-CM

## 2014-04-26 DIAGNOSIS — D869 Sarcoidosis, unspecified: Secondary | ICD-10-CM

## 2014-04-26 MED ORDER — LEVALBUTEROL HCL 0.63 MG/3ML IN NEBU
0.6300 mg | INHALATION_SOLUTION | Freq: Once | RESPIRATORY_TRACT | Status: AC
  Administered 2014-04-26: 0.63 mg via RESPIRATORY_TRACT

## 2014-04-26 MED ORDER — PREDNISONE 10 MG PO TABS
ORAL_TABLET | ORAL | Status: DC
Start: 2014-04-26 — End: 2014-05-17

## 2014-04-26 NOTE — Patient Instructions (Signed)
Begin BREO 1 puff daily  Prednisone taper : 40mg  daily for 3 days , 30mg  daily for 3 days , then 20mg  daily for 1 week then 10mg  daily for 1 week and stop .  May use Proair HFA 2 puffs every 4hrs as needed.  Please contact office for sooner follow up if symptoms do not improve or worsen or seek emergency care  Labs today .  follow up Dr. Lenna Gilford  In 3 weeks and As needed

## 2014-04-26 NOTE — Progress Notes (Addendum)
Subjective:     Patient ID: Jeremy Meadows, male   DOB: [birth year], 68 y.o.   MRN: [id number]  HPI 68 y/o WF   04/26/2014 AcuteOV  Pt complains of increased SOB, prod cough with clear/cloudy/green-tinged mucus, wheezing x2 months.  Took augmentin initially w/ improved congestion but dry cough and wheezing persist.  Using SABA 8-10 times daily.  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  PFT done on showed  FEV1 73%,  ratio 68 , +BD changes, nml diffusing capacity.  No family hx of sarcoid . No eye changes or rash.         PROBLEM LIST:    GLAUCOMA (ICD-365.9) - on LUMIGAN eye drops per Ophthalmology... ~  He had cats found by Optometrist recently & referred to [facility] for surg==> cats removed in 2012 by his report.  ALLERGIC RHINITIS (ICD-477.9) - uses OTC antihistamines Prn... prev on Rhinocort. ~  He reports allergic to pickles now & Benedryl helps....  OBSTRUCTIVE SLEEP APNEA (ICD-327.23) - had f/u eval DrClance in 2010- optimal CPAP= 11, and using it regularly now... notes some mild snoring, but apparently doesn't disturb his wife... rests well, wakes refreshed, no daytime hypersomnolence... ~  He saw DrClance for sleep f/u 1/12> no issues w/ CPAP or mask, compliant w/ Rx, improved daytime alertness;  he has mod to severe dis & dislikes the CPAP- alternatives discussed; advised wt reduction & he wants to try oral appliance, has appt w/ DrMKatz... ~  3/13: pt tells me that he has no issues w/ his CPAP just doesn't like using it & admits to only using it intermittently now when his wife c/o his snoring; he claims good daytime alertness & no hypersomnolence or problems that he is aware  of... ~  6/14: hx mod OSA & optimized to CPAP11 by DrClance in 2010; he later decr to just prn snoring complaints from his wife, but he tells me he stopped it completely in 2013- says he is doing fine, rests well, good daytime alertness, and no sleep issues noted.  ASTHMATIC BRONCHITIS, ACUTE (ICD-466.0) - he has been stable without signif URIs and no recent exac even w/ his strenuous activity & softball games... uses PROAIR Prn & SINGULAIR 10mg  Prn as well... prev on Asmanex but hasn't needed this yr. 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 ~  CXR 6/14 showed norm heart size, sl hyperinflated lungs- clear/ NAD, DJD Tspine w/ 2 wedge deformities- old, no change...  Family Hx of CARDIAC DISEASE (ICD-429.9) - Father died age 63 w/ ?MI, 1 brother w/ CABG, one brother w/ stents> we discussed risk factor reduction strategy, ASA, etc... AORTIC INSUFFIC MURMUR & DILATED AORTA >> discovered w/ exam showing new cardiac murmur 6/14... ~  He denies CP, palpit, dizzy, SOB, edema, etc... ~  NuclearStressTest 3/08 was neg- no ischemia, no infarct, EF= 58%... ~  EKG 3/13 showed NSR, rate74, NSSTTWA, NAD... ~  EKG 6/14 showed NSR, rate74, some incr voltage & NSSTTWA. ~  6/14:  Pt remains asymptomatic w/o CP, palpit, SOB, etc; but exam today revealed Gr2/6 sys murmur & Gr1/6  diastolic blow at LSB...  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   HYPERCHOLESTEROLEMIA (ICD-272.0) - on SIMVASTATIN 20mg /d, & FISH OIL 1000mg /d... ~  [place] 3/08 showed TChol 128, TG 147, HDL 31, LDL 67 ~  FLP 11/09 showed TChol 124, TG 122, HDL 33, LDL 67 ~  FLP 11/10 showed TChol 120, TG 150, HDL 31, LDL 59 ~  FLP 2/12 showed TChol 121, TG 182, HDL 28, LDL 56... rec better low fat diet & incr exerc. ~  FLP 3/13 on Simva20 showed  TChol 136, TG 147, HDL 35, LDL 72 ~  FLP 6/14 on Simva20 showed TChol 140, TG 121, HDL 35, LDL 81 (he tried Niacin for ~50yr prior to this & no ch in HDL).  COLONIC POLYPS (ICD-211.3) - colonoscopy 2/05 by DrStark showed 51mm polyp removed= tubular adenoma... ~  He had colonoscopy DrStark 12/10> 2 polyps removed & one was a tubular adenoma & removed piecemeal therefore rec f/u 47yrs... ~  4/13: hx several tubular adenomas removed- last 12/10 was removed piecemeal & repeat colon 4/13 by DrStark was neg- f/u planned 66yrs.  NEPHROLITHIASIS (ICD-592.0) - eval 6/10 by DrDahlstadt w/ Lithotripsy required... given RAPAFLO to aide passing the stone. ~  He saw Urology [name] for kidney stones after last visit & he wanted to do ureteroscopy by pt declined & has remained asymptomatic he says... ~  He had f/u DrDalstedt 8/12> another right ureteral stone ~83mm (and a 35mm stone in the right kidney) which he passed spont w/ medical expulsive therapy; f/u UA clear & he is doing satis...  TESTICULAR HYPOFUNCTION (ICD-257.2) ~  He noted low energy & labs checked 12/11 showed Low-T w/ testos level = 140 (350-890);  he was started on Topical Androgel after prior auth done & approved but only took it for 1 month & stopped (misunderstood); we will recheck level & discuss options... ~  labs 2/12 showed Testosterone level = 240 & rec ANDROGEL vs Urology f/u, he will decide. ~  3/13: He again reports not taking the topical Androgel but notes that he felt betterwhile on it; he will commit to taking it regularly this time; Testos level = 199 & we will try to get autorization for ANDROGEL1.62% vs AXIRON... ~  6/14:  known Low-T (as low as 140 in 2011); he tried several topical meds but always stopped the Rx; off again & states he's feeling fine, good energy, no libido issues etc & he does not want to restart the meds...  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 ~  VitD defic> Neuro in [place] found VitD=26 (4/14) & treated w/ 50K x8wks then switching to VitD 1000u daily.  HEALTH MAINTENANCE:  he takes ASA 81mg /d, and Calcium/ Vitamins/ etc... ~  GI>  Followed by DrStark & due for f/u colonoscopy now (3/13)... ~  GU>  Followed by DrDahlstedt & passed kid stone 2012; Low-T as noted above & rx w/ Topical gel rx + Levitra... ~  Immunizations:  He gets the yearly Flu vaccine;  Given TDAP & PNEUMOVAX 3/13 at age 36...   Past Surgical History  Procedure Laterality Date  . Rhinoplasty  1980s  . Knee surgery Right 05/2012    arthroscopic  . Medial partial knee replacement Right 03/2013  . Knee surgery Right 12/2013    patella  . Lithotripsy    . Lung biopsy Left 2015    by IR - benign  . Cataract extraction Bilateral     Outpatient Encounter Prescriptions as of 04/26/2014  Medication Sig  . albuterol (VENTOLIN HFA) 108 (90 BASE) MCG/ACT inhaler Inhale 2 puffs into the lungs every 6 (six) hours as needed for wheezing or shortness of breath.  [name] amLODipine (NORVASC) 5 MG tablet Take 5 mg by mouth every evening.  . brinzolamide (AZOPT) 1 % ophthalmic suspension Place 1 drop into both eyes 2 (two) times daily.  . cholecalciferol (VITAMIN D) 1000 UNITS tablet Take 1,000 Units by mouth daily.  [name] MELATONIN PO Take 6 mg by mouth at bedtime.  . Multiple Vitamin (MULTIVITAMIN) capsule Take 1 capsule by mouth daily.  . simvastatin (ZOCOR) 20 MG tablet Take 1 tablet (20 mg total) by mouth every evening.   . vitamin C (ASCORBIC ACID) 500 MG tablet Take 500 mg by mouth 2 (two) times daily.    No Known Allergies   Current Medications, Allergies, Past Medical History, Past Surgical History, Family History, and Social History were reviewed in [employer] record.   Review of Systems    The patient denies fever, chills, sweats, anorexia, fatigue, weakness, malaise, weight loss, sleep disorder, blurring, diplopia, eye irritation, eye discharge, vision loss, eye pain, photophobia, earache, ear discharge, tinnitus, decreased hearing, nasal congestion, nosebleeds, sore throat, hoarseness, chest pain, palpitations, syncope, nausea, vomiting, diarrhea, constipation, change in bowel habits, abdominal pain, melena, hematochezia, jaundice, gas/bloating, indigestion/heartburn, dysphagia, odynophagia, dysuria,  urinary frequency, urinary hesitancy, nocturia, incontinence, back pain, joint pain, joint swelling, muscle cramps, muscle weakness, stiffness, arthritis, sciatica, restless legs, leg pain at night, leg pain with exertion, rash, itching, dryness, suspicious lesions, paralysis, paresthesias, seizures, tremors, vertigo, transient blindness, frequent falls, frequent headaches, difficulty walking, depression, anxiety, memory loss, confusion, cold intolerance, heat intolerance, polydipsia, polyphagia, polyuria, unusual weight change, abnormal bruising, bleeding, enlarged lymph nodes, urticaria, allergic rash, hay fever, and recurrent infections.     Objective:   Physical Exam    WD, WN, 68 y/o WM in NAD... GENERAL:  Alert & oriented; pleasant & cooperative. HEENT:  [place]/AT, EOM-wnl, Hx Glaucoma on gtts, EACs-clear, TMs-wnl, NOSE-clear, THROAT-clear & wnl. NECK:  Supple w/ full ROM; no JVD; normal carotid impulses w/o bruits; no thyromegaly or nodules palpated; no lymphadenopathy. CHEST:  Clear to P & A; without wheezes/ rales/ or rhonchi. HEART:  Regular Rhythm; Gr2/6 sys murmur & OF7/5  diastolic blow at LSB, w/o rubs or gallops heard... ABDOMEN:  Soft & nontender; normal bowel sounds; no organomegaly or masses detected., no guarding or rebound, neg CVA tenderness  EXT: without deformities or arthritic changes; no varicose veins/ venous insuffic/ or edema. NEURO:  motor testing normal;normal; gait normal & balance OK. DERM:  No lesions noted; no rash etc...     04/26/2014 Urine dipstick shows large blood, RBC   Assessment:

## 2014-04-26 NOTE — Assessment & Plan Note (Addendum)
Recent PFT c/w Asthma with probable underlying sarcoid recent CT w/ no evidence of fibrosis noted , diffucing capacity on PFT ok  Reversible changes on PFT  Will challenge with ICS/LABA combo to see if control of symptoms  If improving consider step down therapy  Advised in dangers of SABA overuse  Plan  Begin BREO 1 puff daily  Prednisone taper : 40mg  daily for 3 days , 30mg  daily for 3 days , then 20mg  daily for 1 week then 10mg  daily for 1 week and stop .  May use Proair HFA 2 puffs every 4hrs as needed.  Please contact office for sooner follow up if symptoms do not improve or worsen or seek emergency care  Labs today .  follow up Dr. Lenna Gilford  In 3 weeks and As needed

## 2014-04-26 NOTE — Addendum Note (Signed)
Addended by: Parke Poisson E on: 04/26/2014 04:35 PM   Modules accepted: Orders

## 2014-04-26 NOTE — Assessment & Plan Note (Signed)
03/2014) 2cm, ?sarcoid by biopsy Lung, needle/core biopsy(ies), LLL - NONCASEATING GRANULOMATOUS INFLAMMATION. - FIBROBLASTIC FOCI. - THERE IS NO EVIDENCE OF MALIGNANCY. 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

## 2014-04-27 LAB — ANGIOTENSIN CONVERTING ENZYME: ANGIOTENSIN-CONVERTING ENZYME: 37 U/L (ref 8–52)

## 2014-04-30 ENCOUNTER — Encounter: Payer: Self-pay | Admitting: Adult Health

## 2014-05-16 ENCOUNTER — Ambulatory Visit: Payer: Medicare HMO | Admitting: Cardiothoracic Surgery

## 2014-05-17 ENCOUNTER — Ambulatory Visit (INDEPENDENT_AMBULATORY_CARE_PROVIDER_SITE_OTHER): Payer: Commercial Managed Care - HMO | Admitting: Pulmonary Disease

## 2014-05-17 ENCOUNTER — Encounter: Payer: Self-pay | Admitting: Pulmonary Disease

## 2014-05-17 VITALS — BP 130/88 | HR 85 | Temp 98.5°F | Ht 72.0 in | Wt 200.8 lb

## 2014-05-17 DIAGNOSIS — N200 Calculus of kidney: Secondary | ICD-10-CM

## 2014-05-17 DIAGNOSIS — J45909 Unspecified asthma, uncomplicated: Secondary | ICD-10-CM

## 2014-05-17 DIAGNOSIS — M159 Polyosteoarthritis, unspecified: Secondary | ICD-10-CM

## 2014-05-17 DIAGNOSIS — E291 Testicular hypofunction: Secondary | ICD-10-CM

## 2014-05-17 DIAGNOSIS — R911 Solitary pulmonary nodule: Secondary | ICD-10-CM

## 2014-05-17 DIAGNOSIS — J4530 Mild persistent asthma, uncomplicated: Secondary | ICD-10-CM

## 2014-05-17 DIAGNOSIS — I350 Nonrheumatic aortic (valve) stenosis: Secondary | ICD-10-CM

## 2014-05-17 DIAGNOSIS — E7800 Pure hypercholesterolemia, unspecified: Secondary | ICD-10-CM

## 2014-05-17 DIAGNOSIS — S22000D Wedge compression fracture of unspecified thoracic vertebra, subsequent encounter for fracture with routine healing: Secondary | ICD-10-CM

## 2014-05-17 DIAGNOSIS — I712 Thoracic aortic aneurysm, without rupture, unspecified: Secondary | ICD-10-CM

## 2014-05-17 DIAGNOSIS — G4733 Obstructive sleep apnea (adult) (pediatric): Secondary | ICD-10-CM

## 2014-05-17 DIAGNOSIS — I10 Essential (primary) hypertension: Secondary | ICD-10-CM

## 2014-05-17 DIAGNOSIS — I359 Nonrheumatic aortic valve disorder, unspecified: Secondary | ICD-10-CM

## 2014-05-17 DIAGNOSIS — M150 Primary generalized (osteo)arthritis: Secondary | ICD-10-CM

## 2014-05-17 DIAGNOSIS — D869 Sarcoidosis, unspecified: Secondary | ICD-10-CM

## 2014-05-17 MED ORDER — FLUTICASONE FUROATE-VILANTEROL 100-25 MCG/INH IN AEPB: 2.0000 | INHALATION_SPRAY | Freq: Every day | RESPIRATORY_TRACT | Status: DC

## 2014-05-17 NOTE — Patient Instructions (Signed)
Today we updated your med list in our EPIC system...    Continue your current medications the same...  Continue the BREO inhaler daily...  We will arrange for a new CPAP apparatus- the AUTOSET II w/ nasal pillows per your request...  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    Options are to have this "wedged out" at time of your heart surg, or to continue following this w/ CT Chest in Dec2015...   Call for any questions...  Let's plan a follow up visit in 43mo, sooner if needed for problems.Marland KitchenMarland Kitchen

## 2014-05-17 NOTE — Progress Notes (Addendum)
Subjective:     Patient ID: Jeremy Meadows, male   DOB: [birth year], 68 y.o.   MRN: [id number]  HPI 68 y/o WF here for a follow up visit... he has multiple medical problems as noted below...    ~  December 02, 2011:  Yearly ROV & he reports a good year overall; he had Fluvax 12/12 & now due to [place]     He saw DrClance for sleep f/u last 2/12> stable on CPAP & wearing it 6-7H per night; he was going to research alternative therapies eg- dental appliance but has continued the CPAP instead...    He had f/u Urology, DrDalstedt 8/12> he had another right ureteral stone ~33m (and a 748mstone in the right kidney) which he passed spont w/ medical expulsive therapy; f/u UA clear & he is doing satis;  He also has Low-T and never stuck to the topical therapy> repeat Testos=199 & he will commit to Rx w/ Androgel vs Axiron; requests Levitra as well- ok...    He is bothered by knee pain, and still plays competitive softball in several adult leagues; he [facility]/ bone spurs & several shots in the knees...  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  EKG 3/13 showed NSR, rate74, NSSTTWA, NAD...  LABS 3/13 showed:  FLP- at goal on Simva20 x low HDL;  Chems- ok;  CBC- wnl;  TSH=1.28;  PSA=1.10;  Testos=199  ~  February 27, 2013:  1577moV & [name] me he retired from his job at [employer] work) 3/14... He has had considerable trouble over the past yr w/ his right knee- SEE BELOW, soon to be sched for a part knee resurfacing (Makoplasty) by the [facility]opedists [facility] Today's exam reveals a new Gr2-3/6 MR murmur & this evaluation is under way...  We reviewed the following medical problems during today's office visit >>     Ophthal> Hx glaucoma on gtts and s/p cat surg in 2012...    OSA> hx mod OSA & optimized to CPAP11 by DrClance in 2010; he later decr to just prn snoring complaints from his  wife, but he tells me he stopped it completely in 2013- says he is doing fine, rests well, good daytime alertness, and no sleep issues noted...     Asthma> he is a never smoker, Hx AB in past & prev on Asmanex, Singulair, but now just Proair prn; he denies cough, sput, hemoptysis, SOB, etc...    +FamHxCAD> pt's father died at 48 45 ?MI; 1Bro w/ CABG; 1Bro w/ stents; he had Myoview 2008 which was neg- no ischemia, no infarct, EF=58%... New heart murmur heard 6/14=> eval in progress...    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    CHOL> on Simva20, FishOil; he took OTC Niacin500 this past yr & recently ran out (tol satis); FLP 6/14 shows TChol 140, TG 121, HDL 35, LDL 81...    GI- colon polyps> hx several tubular adenomas removed- last 12/10 was removed piecemeal & repeat colon 4/13 by DrStark was neg- f/u planned 48yr82yr     GU- Kidney stones, Hypogonadism> followed by DrDahlstedt, last stone 8/12 & passed w/ medical therapy; known Low-T (as low as 140 in 2011); he tried several topical meds but always stopped the Rx; off again & states he's feeling fine, good  energy, no libido issues etc & he does not want to restart the meds...    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    DJD spine w/ wedge deformities> old CXRs back to 2007 show 2 wedge deformities in mid Tspine; he denies pain & plays active adult softball league til his recent knee problems...    VitD defic> Neuro in [place] found VitD=26 & treated w/ 50K x8wks then switching to VitD 1000u daily... He needs a baseline BMD. We reviewed prob list, meds, xrays  and labs> see below for updates >>   LABS 4/14 by Moenkopi in [place] (Neurology):  Chems- wnl x/ Na=154 Cr=1.3;  CBC- wnl;  Sed=10;  ANA= neg;  A1c=5.8;  B12=620;  SPE/IEP= wnl;  VitD=26 => treated w/ Vit D supplement...  LABS 6/14:  FLP- ok on Simva20;  TSH=0.95...  CXR 6/14 showed norm heart size, sl hyperinflated lungs- clear/ NAD, DJD Tspine w/ 2 wedge deformities- old, no change...  EKG 6/14 showed NSR, rate74, some incr voltage & NSSTTWA... 2DEcho => mod calcif AoV leaflets w/ mod to severe AI & mod dil Aorta; norm LVF; refer to Cards for further eval => seen by DrJordan w/ AS/ AI/ Asc Ao Aneurysm CT Angio 6/15 showed heavily calcif AoV, 5.1cm prox ascending Ao, mild bibasilar atx otherw clear lungs, vertebral compression in T8 T9 T10 w/o change => started on Amlodipine.  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  [name]'s CC is arthritis which limits his mobility;  He had partial knee [place] 7[place]od[place] improved he says...   He has gained 9# up to 206# & we reviewed diet, exercise, wt reduction strategies;  He remains on Simva20, Celebrex200 prn...   We reviewed prob list, meds, xrays and labs> see below for updates >>   ~  May 17, 2014:  8-27mo8mo & here for pulmonary follow up>  [name] certainly had a difficult time lately-  First he had knee surg (MakCandie Chroman4 & arthroscopy 4/15 by [facility]), then another kidney stone & ultimately required Lithotripsy (DrDahlstedt 5/15);  He had routine 31mo 37mow/ DrJordan 6/15 w/ 2DEcho showing norm LV size & function w/ EF=60-65% and norm wall motion, Gr1DD, AoV may be bicuspid? mod thickened & severely calcif, reduced cusp separation & severe AS but no AI identified,  Ao root dilated to 5.4cm, mean gradient42 & peak gradient65;  6/15 CTAngio showed a NEW bilobed LLL nodule ~2.5cm in size, AoV calcif, Asc Ao measures 5.1cm;  He referred the pt to DrGerhardt & his 7/15 note is reviewed- he noted the pt's excellent functional capacity (Zubrod score of zero- norm activity & no symptoms) and proceeded w/ PFT, PET, Needle bx>   PFT done 7/15 & showed FVC=3.72 (79%), FEV1=2.54 (73%), %1sec=68%, mid-flows=64% predicted (c/w mild airflow obstruction); post bronchodil spirometry showed FEV1=2.94 (85%) which is  a 15% reversible (asthmatic) component; Lung Volumes and DLCO were WNL...  PET Scan 7/15 showed low grade metabolic activ in LLL nodule w/ SUV max= 1.7, no other lesions identified, no nodal or distant met dis seen, AscAo aneurysm measured 5.6cm, incidental sm calcif gallstones  Needle bx LLL lesion by IR 7/15>  noncaseating granulomatous inflamm, fibroblastic foci, no evid of malig...   Subseq LABS>  ACE level = 37 (8-52)... He still needs collagen-vasc screen (Sed, RA factor, ANA, ANCA) but has just come off Pred & we will wait several weeks to check these...  DrGerhardt has rec f/u w/ Cards for Cath- check coronaries, check AoV & ascending Ao for timing of surg; and we will need to know if the addition of a wedge resection of the LLL nodule would increase the difficulty/ morbidity of the CV procedure or not; if this is not recommended then we will continue to follow the lesion w/ another CT in 63month...   We reviewed the following medical problems during today's office visit >> he has established w/ DrGutierrez for Primary Care...    OSA> hx mod OSA & optimized to CPAP11 by DrClance in 2010; he later decr to just prn snoring complaints from his wife, and he stopped it completely in 2013; since then he tried a relatives new Autoset machine and nasal pillows and liked this apparatus- he requests [place] get this new equipment for him.    Asthma> he is a never smoker, Hx  AB in past & prev on Asmanex, Singulair, then just Proair prn; he developed incr SOB, cough w/ green mucous, & wheezing that required freq use of his ProairHFA- seen by TP 7/15 & given Augmentin/ Pred taper/ and started on Breo100 (one puff daily)=> improved now & he will continue the Breo & prn Proair... NOTE PFTs 7/15 w/ mild obstruction & reversible component.  ADDENDUM>> Pt returned for Collagen-vasc screen (neg Rheum factor, ANA, ANCA, & Sed=34) & Quantiferon Gold assay (NEG)... He had his Heart Cath 05/22/14- AoV peak gradient=60, mean=28, AoV area=1.3; marked enlargement of AscAo; Coronaries- some non-obstructive dis present w/ focal 20-30% mid-LAD & 50% ostial RCAstenosis (sm vessel)... Pt has had f/u DrGerhardt & surg set for 10/6- pt indicates that they are not in favor of resecting the nodule at time of his heart surg.. I reviewed w/ pt the results to date- LLL nodule ?etiology, bx showed noncaseating granulomatous inflamm, ACE=37, neg collagen-vasc work up, neg QuantiferonGold, no evid malignancy to date (appears benign but etiology still not known w/ certainty)...          PROBLEM LIST:    GLAUCOMA (ICD-365.9) - on LUMIGAN eye drops per Ophthalmology... ~  He had cats found by Optometrist recently & [facility]> cats removed in 2012 by his report.  ALLERGIC RHINITIS (ICD-477.9) - uses OTC antihistamines Prn... prev on Rhinocort. ~  He reports allergic to pickles now & Benedryl helps....  OBSTRUCTIVE SLEEP APNEA (ICD-327.23) - had f/u eval DrClance in 2010- optimal CPAP= 11, and using it regularly now... notes some mild snoring, but apparently doesn't disturb his wife... rests well, wakes refreshed, no daytime hypersomnolence... ~  He saw DrClance for sleep f/u 1/12> no issues w/ CPAP or mask, compliant w/ Rx, improved daytime alertness;  he has mod to severe dis & dislikes the CPAP- alternatives discussed; advised wt reduction & he wants to try oral appliance, has appt  w/ DrMKatz... ~  3/13: pt tells me that he has no  issues w/ his CPAP just doesn't like using it & admits to only using it intermittently now when his wife c/o his snoring; he claims good daytime alertness & no hypersomnolence or problems that he is aware of... ~  6/14: hx mod OSA & optimized to CPAP11 by DrClance in 2010; he later decr to just prn snoring complaints from his wife, but he tells me he stopped it completely in 2013- says he is doing fine, rests well, good daytime alertness, and no sleep issues noted. ~  8/15: hx mod OSA & optimized to CPAP11 by DrClance in 2010; he later decr to just prn snoring complaints from his wife, and he stopped it completely in 2013; since then he tried a relatives new Autoset machine and nasal pillows and liked this apparatus- he requests [place] to get this new equipment for him.  ASTHMA and ASTHMATIC BRONCHITIS >>  ~  uses PROAIR Prn & SINGULAIR 38m Prn as well... prev on Asmanex but hasn't needed this yr. ~  he has been stable without signif URIs and no recent exac even w/ his strenuous activity & softball games... 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 ~  CXR 6/14 showed norm heart size, sl hyperinflated lungs- clear/ NAD, DJD Tspine w/ 2 wedge deformities- old, no change... ~  PFT done 7/15 & showed FVC=3.72 (79%), FEV1=2.54 (73%), %1sec=68%, mid-flows=64% predicted (c/w mild airflow obstruction); post bronchodil spirometry showed FEV1=2.94 (85%) which is a 15% reversible (asthmatic) component; Lung Volumes and DLCO were WNL..[name] ~  8/15: he is a never smoker, Hx AB in past & prev on Asmanex, Singulair, then just Proair prn; he developed incr SOB, cough w/ green mucous, & wheezing that required freq use of his ProairHFA- seen by TP 7/15 & given Augmentin/ Pred taper/ and started on Breo100 (one puff daily)=> improved now & he will continue the Breo & prn Proair... NOTE PFTs 7/15 w/ mild obstruction &  reversible component.  ABNORMAL CXR and CT CHEST w/ LLL NODULE >> discovered 6/15 on f/u CT Chest to check AscAoAneurysm, eval by DrGerhardt... ~  6/15:  CTAngio showed a new bilobed LLL nodule ~2.5cm in size, AoV calcif, Asc Ao measures 5.1cm... ~  PET Scan 7/15 showed low grade metabolic activ in LLL nodule w/ SUV max= 1.7, no other lesions identified, no nodal or distant met dis seen, AscAo aneurysm measured 5.6cm, incidental sm calcif gallstones. ~  Needle bx LLL lesion by IR 7/15>  noncaseating granulomatous inflamm, fibroblastic foci, no evid of malig...  ~  8/15: subseq LABS>  ACE level = 37 (8-52)... collagen-vasc screen (Sed=34, RA factor-neg, ANA-neg, ANCA-neg) & Quantiferon Gold assay was neg...  Family Hx of CARDIAC DISEASE (ICD-429.9) - Father died age 2571w/ ?MI, 1 brother w/ CABG, one brother w/ stents> we discussed risk factor reduction strategy, ASA, etc... AORTIC VALVE DISEASE w/ AS/AI >> discovered w/ exam showing new cardiac murmur 6/14... ASCENDING AORTIC ANEURYSM >> CT Angio 6/14 showed 5.1cm asc ao dilitation ~  He denies CP, palpit, dizzy, SOB, edema, etc... ~  NuclearStressTest 3/08 was neg- no ischemia, no infarct, EF= 58%... ~  EKG 3/13 showed NSR, rate74, NSSTTWA, NAD... ~  EKG 6/14 showed NSR, rate74, some incr voltage & NSSTTWA. ~  6/14:  Pt remains asymptomatic w/o CP, palpit, SOB, etc; but exam today revealed Gr2/6 sys murmur & GOB0/9diastolic blow at LSB...  ~  2DEcho 6/14 showed norm LV wall  thickness & norm LVF w/ EF=60-65%, mod calcif AoV (annulus & leaflets), AS not mentioned specifically (see the report), mod to severe AI, mod Asc Ao dil, mean gradient41/ peak gradient63... ~  CTAngio 6/14 showed heavily calcif AoV, 5.1cm prox ascending Ao, mild bibasilar atx otherw clear lungs, vertebral compression in T8 T9 T10 w/o change. ~  Pt evaluated by DrPJordan 6/14 & 12/14 for AS/ AI/ Daryll Brod Ao aneurysm measuring 5.1cm...  ~  2DEcho 6/15 showing norm LV size & function  w/ EF=60-65% and norm wall motion, Gr1DD, AoV may be bicuspid? mod thickened & severely calcif, reduced cusp separation & severe AS but no AI identified, Ao root dilated to 5.4cm, mean gradient42 & peak gradient65...  ~  CTAngio 6/15 showed a NEW bilobed LLL nodule ~2.5cm in size, AoV calcif, Asc Ao measures 5.1cm ~  Full eval by DrPJordan, Cards and DrGerhardt, CV Surg => as noted above: Severe AS, Asc Thor Ao Aneurysm & SURG sched for 07/02/14...  HYPERCHOLESTEROLEMIA (ICD-272.0) - on SIMVASTATIN 59m/d, & FISH OIL 10054md... ~  [place]/08 showed TChol 128, TG 147, HDL 31, LDL 67 ~  FLP 11/09 showed TChol 124, TG 122, HDL 33, LDL 67 ~  FLP 11/10 showed TChol 120, TG 150, HDL 31, LDL 59 ~  FLP 2/12 showed TChol 121, TG 182, HDL 28, LDL 56... rec better low fat diet & incr exerc. ~  FLP 3/13 on Simva20 showed TChol 136, TG 147, HDL 35, LDL 72 ~  FLP 6/14 on Simva20 showed TChol 140, TG 121, HDL 35, LDL 81 (he tried Niacin for ~1y52yrior to this & no ch in HDL).  COLONIC POLYPS (ICD-211.3) - colonoscopy 2/05 by DrStark showed 3mm54mlyp removed= tubular adenoma... ~  He had colonoscopy DrStark 12/10> 2 polyps removed & one was a tubular adenoma & removed piecemeal therefore rec f/u 82yrs35yr~  4/13: hx several tubular adenomas removed- last 12/10 was removed piecemeal & repeat colon 4/13 by DrStark was neg- f/u planned 33yrs.25yrPHROLITHIASIS (ICD-592.0) - eval 6/10 by DrDahlstadt w/ Lithotripsy required... given RAPAFLO to aide passing the stone. ~  He saw Urology [name] for kidney stones after last visit & he wanted to do ureteroscopy by pt declined & has remained asymptomatic he says... ~  He had f/u DrDalstedt 8/12> another right ureteral stone ~5mm (a56ma 7mm sto80min the right kidney) which he passed spont w/ medical expulsive therapy; f/u UA clear & he is doing satis...  TESTICULAR HYPOFUNCTION (ICD-257.2) ~  He noted low energy & labs checked 12/11 showed Low-T w/ testos level = 140  (350-890);  he was started on Topical Androgel after prior auth done & approved but only took it for 1 month & stopped (misunderstood); we will recheck level & discuss options... ~  labs 2/12 showed Testosterone level = 240 & rec ANDROGEL vs Urology f/u, he will decide. ~  3/13: He again reports not taking the topical Androgel but notes that he felt betterwhile on it; he will commit to taking it regularly this time; Testos level = 199 & we will try to get autorization for ANDROGEL1.62% vs AXIRON... ~  6/14:  known Low-T (as low as 140 in 2011); he tried several topical meds but always stopped the Rx; off again & states he's feeling fine, good energy, no libido issues etc & he does not want to restart the meds...  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 ~  VitD defic> Neuro in [place] found VitD=26 (4/14) & treated w/ 50K x8wks then switching to VitD 1000u daily.  HEALTH MAINTENANCE:  he takes ASA 33m/d, and Calcium/ Vitamins/ etc... Now followed by DrGutierrez for Primary Care. ~  GI>  Followed by DrStark & due for f/u colonoscopy now (3/13)... ~  GU>  Followed by DrDahlstedt & passed kid stone 2012; Low-T as noted above & rx w/ Topical gel rx + Levitra... ~  Immunizations:  He gets the yearly Flu vaccine;  Given TDAP & PNEUMOVAX 3/13 at age 68..   Past Surgical History  Procedure Laterality Date  . Rhinoplasty  1980s  . Knee surgery Right 05/2012    arthroscopic  .  Medial partial knee replacement Right 03/2013  . Knee surgery Right 12/2013    patella  . Lithotripsy    . Lung biopsy Left 2015    by IR - benign  . Cataract extraction Bilateral     Outpatient Encounter Prescriptions as of 05/17/2014  Medication Sig  . albuterol (VENTOLIN HFA) 108 (90 BASE) MCG/ACT inhaler Inhale 2 puffs into the lungs every 6 (six) hours as needed for wheezing or shortness of breath.  .Marland KitchenamLODipine (NORVASC) 5 MG tablet Take 5 mg by mouth every evening.  . brinzolamide (AZOPT) 1 % ophthalmic suspension Place 1 drop into both eyes 2 (two) times daily.  . cholecalciferol (VITAMIN D) 1000 UNITS tablet Take 1,000 Units by mouth daily.  . Fluticasone Furoate-Vilanterol (BREO ELLIPTA) 200-25 MCG/INH AEPB Inhale 1 puff into the lungs daily.   .Marland KitchenMELATONIN PO Take 6 mg by mouth at bedtime.  . Multiple Vitamin (MULTIVITAMIN) capsule Take 1 capsule by mouth daily.  . simvastatin (ZOCOR) 20 MG tablet Take 1 tablet (20 mg total) by mouth every evening.  . vitamin C (ASCORBIC ACID) 500 MG tablet Take 500 mg by mouth 2 (two) times daily.  . Fluticasone Furoate-Vilanterol (BREO ELLIPTA) 100-25 MCG/INH AEPB Inhale 2 puffs into the lungs daily.  . [DISCONTINUED] predniSONE (DELTASONE) 10 MG tablet 4 tabs for 3days, then 3 tabs for 3 days, 2 tabs daily x 1 wk,  1 tab daily for 1 wk , then stop    No Known Allergies   Current Medications, Allergies, Past Medical History, Past Surgical History, Family History, and Social History were reviewed in [employer]record.   Review of Systems    The patient denies fever, chills, sweats, anorexia, fatigue, weakness, malaise, weight loss, sleep disorder, blurring, diplopia, eye irritation, eye discharge, vision loss, eye pain, photophobia, earache, ear discharge, tinnitus, decreased hearing, nasal congestion, nosebleeds, sore throat, hoarseness, chest pain, palpitations, syncope, dyspnea on exertion, orthopnea, PND, peripheral  edema, cough, dyspnea at rest, excessive sputum, hemoptysis, wheezing, pleurisy, nausea, vomiting, diarrhea, constipation, change in bowel habits, abdominal pain, melena, hematochezia, jaundice, gas/bloating, indigestion/heartburn, dysphagia, odynophagia, dysuria, hematuria, urinary frequency, urinary hesitancy, nocturia, incontinence, back pain, joint pain, joint swelling, muscle cramps, muscle weakness, stiffness, arthritis, sciatica, restless legs, leg pain  at night, leg pain with exertion, rash, itching, dryness, suspicious lesions, paralysis, paresthesias, seizures, tremors, vertigo, transient blindness, frequent falls, frequent headaches, difficulty walking, depression, anxiety, memory loss, confusion, cold intolerance, heat intolerance, polydipsia, polyphagia, polyuria, unusual weight change, abnormal bruising, bleeding, enlarged lymph nodes, urticaria, allergic rash, hay fever, and recurrent infections.     Objective:   Physical Exam    WD, WN, 68 y/o WM in NAD... GENERAL:  Alert & oriented; pleasant & cooperative. HEENT:  [place]/AT, EOM-wnl, Hx Glaucoma on gtts, EACs-clear, TMs-wnl, NOSE-clear, THROAT-clear & wnl. NECK:  Supple w/ full ROM; no JVD; normal carotid impulses w/o bruits; no thyromegaly or nodules palpated; no lymphadenopathy. CHEST:  Clear to P & A; without wheezes/ rales/ or rhonchi. HEART:  Regular Rhythm; Gr2/6 sys murmur & FK8/1 diastolic blow at LSB, w/o rubs or gallops heard... ABDOMEN:  Soft & nontender; normal bowel sounds; no organomegaly or masses detected. RECTAL:  Neg - prostate 2+ & nontender w/o nodules; stool hematest neg. EXT: without deformities or arthritic changes; no varicose veins/ venous insuffic/ or edema. NEURO:  CN's intact; motor testing normal; sensory testing normal; gait normal & balance OK. DERM:  No lesions noted; no rash etc...  RADIOLOGY DATA:  Reviewed in the EPIC EMR & discussed w/ the patient...    LABORATORY DATA:  Reviewed in the EPIC EMR &  discussed w/ the patient...    Assessment:      OSA>  He has not been using his CPAP x yrs but recently tried a relatives Autoset machine & nasal pillows; he says he liked these & he wants to get a similar set up so we will contact [employer] for their help...  ASTHMA/ AB>  Episode of AB w/ exac 7/15 7 improved after Pred taper, Breo100 added to his ProairHFA rescue inhaler; now off Pred & we will continue the Breo & prn ProairHFA...  PULM NODULE LLL> see on CT Chest 6/15 & eval as above by DrGerhardt- needle bx by IR should noncaseating granulomatous inflamm but this presentation is unusual/ discordant for dx of Sarcoidosis (ie- no ILD & no adenopathy etc) and ACE level is normal;  Still needs collagen-vasc screen, quantiferon gold, ?fungal titers, etc... Next step depends on cardiovasc plans (ie- can lesion be wedged out at time of AoV & AscAo surg vs just watchful waiting w/ repeat CT Chest in Dec2015=50mofrom last)...     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  CHOL>  On Simva20 & parameters are ok x sl low HDL at 35; we discussed continued med + better diet & wt reduction...  Colon polyps>  Follow up colon 4/13 was clear... Repeat planned for 59yrper DrStark...  GU>  Hx nephrolithiasis & testic hypofunction>  He passed a stone on his own last yr w/ MET & doing well;  He has Low-T as well but does not want to take meds...  DJD>  [place] w/ [place], mult shots, & Makoplasty 7/14...     Plan:     Patient's Medications  New Prescriptions   FLUTICASONE FUROATE-VILANTEROL (BREO ELLIPTA) 100-25 MCG/INH AEPB    Inhale 2 puffs into the lungs daily.  Previous Medications   ALBUTEROL (VENTOLIN HFA) 108 (90 BASE) MCG/ACT INHALER    Inhale 2 puffs into the lungs every 6 (six) hours as needed for wheezing or shortness of breath.   AMLODIPINE (NORVASC) 5 MG TABLET  Take 5 mg by  mouth every evening.   BRINZOLAMIDE (AZOPT) 1 % OPHTHALMIC SUSPENSION    Place 1 drop into both eyes 2 (two) times daily.   CHOLECALCIFEROL (VITAMIN D) 1000 UNITS TABLET    Take 1,000 Units by mouth daily.   FLUTICASONE FUROATE-VILANTEROL (BREO ELLIPTA) 200-25 MCG/INH AEPB    Inhale 1 puff into the lungs daily.    MELATONIN PO    Take 6 mg by mouth at bedtime.   MULTIPLE VITAMIN (MULTIVITAMIN) CAPSULE    Take 1 capsule by mouth daily.   SIMVASTATIN (ZOCOR) 20 MG TABLET    Take 1 tablet (20 mg total) by mouth every evening.   VITAMIN C (ASCORBIC ACID) 500 MG TABLET    Take 500 mg by mouth 2 (two) times daily.  Modified Medications   No medications on file  Discontinued Medications   PREDNISONE (DELTASONE) 10 MG TABLET    4 tabs for 3days, then 3 tabs for 3 days, 2 tabs daily x 1 wk,  1 tab daily for 1 wk , then stop

## 2014-05-20 ENCOUNTER — Encounter (HOSPITAL_COMMUNITY): Payer: Self-pay | Admitting: Pharmacy Technician

## 2014-05-20 ENCOUNTER — Encounter: Payer: Self-pay | Admitting: Cardiology

## 2014-05-20 ENCOUNTER — Ambulatory Visit (INDEPENDENT_AMBULATORY_CARE_PROVIDER_SITE_OTHER): Payer: Commercial Managed Care - HMO | Admitting: Cardiology

## 2014-05-20 ENCOUNTER — Other Ambulatory Visit: Payer: Self-pay | Admitting: Cardiology

## 2014-05-20 VITALS — BP 132/82 | HR 96 | Ht 72.0 in | Wt 201.0 lb

## 2014-05-20 DIAGNOSIS — I712 Thoracic aortic aneurysm, without rupture, unspecified: Secondary | ICD-10-CM

## 2014-05-20 DIAGNOSIS — I350 Nonrheumatic aortic (valve) stenosis: Secondary | ICD-10-CM

## 2014-05-20 DIAGNOSIS — R911 Solitary pulmonary nodule: Secondary | ICD-10-CM | POA: Diagnosis not present

## 2014-05-20 DIAGNOSIS — J45909 Unspecified asthma, uncomplicated: Secondary | ICD-10-CM

## 2014-05-20 DIAGNOSIS — I359 Nonrheumatic aortic valve disorder, unspecified: Secondary | ICD-10-CM | POA: Diagnosis not present

## 2014-05-20 DIAGNOSIS — E7800 Pure hypercholesterolemia, unspecified: Secondary | ICD-10-CM

## 2014-05-20 DIAGNOSIS — I10 Essential (primary) hypertension: Secondary | ICD-10-CM

## 2014-05-20 DIAGNOSIS — J4540 Moderate persistent asthma, uncomplicated: Secondary | ICD-10-CM

## 2014-05-20 LAB — CBC WITH DIFFERENTIAL/PLATELET
BASOS ABS: 0.1 10*3/uL (ref 0.0–0.1)
BASOS PCT: 1 % (ref 0–1)
Eosinophils Absolute: 0.2 10*3/uL (ref 0.0–0.7)
Eosinophils Relative: 3 % (ref 0–5)
HCT: 45.1 % (ref 39.0–52.0)
HEMOGLOBIN: 15.3 g/dL (ref 13.0–17.0)
Lymphocytes Relative: 25 % (ref 12–46)
Lymphs Abs: 1.6 10*3/uL (ref 0.7–4.0)
MCH: 31.9 pg (ref 26.0–34.0)
MCHC: 33.9 g/dL (ref 30.0–36.0)
MCV: 94.2 fL (ref 78.0–100.0)
MONO ABS: 0.6 10*3/uL (ref 0.1–1.0)
Monocytes Relative: 10 % (ref 3–12)
NEUTROS ABS: 3.8 10*3/uL (ref 1.7–7.7)
NEUTROS PCT: 61 % (ref 43–77)
Platelets: 180 10*3/uL (ref 150–400)
RBC: 4.79 MIL/uL (ref 4.22–5.81)
RDW: 13.9 % (ref 11.5–15.5)
WBC: 6.2 10*3/uL (ref 4.0–10.5)

## 2014-05-20 LAB — BASIC METABOLIC PANEL
BUN: 18 mg/dL (ref 6–23)
CALCIUM: 9.9 mg/dL (ref 8.4–10.5)
CO2: 27 mEq/L (ref 19–32)
CREATININE: 1.19 mg/dL (ref 0.50–1.35)
Chloride: 103 mEq/L (ref 96–112)
GLUCOSE: 98 mg/dL (ref 70–99)
Potassium: 4.4 mEq/L (ref 3.5–5.3)
Sodium: 140 mEq/L (ref 135–145)

## 2014-05-20 LAB — PROTIME-INR
INR: 0.9 (ref <1.50)
PROTHROMBIN TIME: 12.2 s (ref 11.6–15.2)

## 2014-05-20 NOTE — Progress Notes (Signed)
I will  Jeremy Meadows Date of Birth: Sep 27, 1946 Medical Record #[id number]  History of Present Illness: Jeremy Meadows is seen for followup of aortic stenosis and  thoracic aortic aneurysm. On his last visit we performed an Echo that showed severe aortic stenosis with peak velocity of 4 m/sec and mean gradient of 42 mm Hg. The proximal aorta was aneurysmal with diameter of 5.1 cm. He was also noted to have a LLL pulmonary nodule. PET scan showed low metabolic activity. 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 He was seen by Dr. Lenna Meadows who felt he did not have sarcoidosis. He was treated for acute asthmatic bronchitis in July treated with steroids and inhalers with improvement. He still notes that he gets tired easily and SOB when walking back from his barn. He denies any chest pain or palpitations.  Current Outpatient Prescriptions on File Prior to Visit  Medication Sig Dispense Refill  . albuterol (VENTOLIN HFA) 108 (90 BASE) MCG/ACT inhaler Inhale 2 puffs into the lungs every 6 (six) hours as needed for wheezing or shortness of breath.  1 Inhaler  0  . amLODipine (NORVASC) 5 MG tablet Take 5 mg by mouth every evening.      . brinzolamide (AZOPT) 1 % ophthalmic suspension Place 1 drop into both eyes 2 (two) times daily.      . cholecalciferol (VITAMIN D) 1000 UNITS tablet Take 1,000 Units by mouth daily.      . Fluticasone Furoate-Vilanterol (BREO ELLIPTA) 100-25 MCG/INH AEPB Inhale 2 puffs into the lungs daily.  1 each  11  . MELATONIN PO Take 6 mg by mouth at bedtime.      . Multiple Vitamin (MULTIVITAMIN) capsule Take 1 capsule by mouth daily.      . simvastatin (ZOCOR) 20 MG tablet Take 1 tablet (20 mg total) by mouth every evening.  90 tablet  3  . vitamin C (ASCORBIC ACID) 500 MG tablet Take 500 mg by mouth 2 (two) times daily.       No current facility-administered medications on file prior to visit.    No Known Allergies  Past Medical History  Diagnosis Date   . Unspecified glaucoma   . Allergic rhinitis, cause unspecified   . Aortic valve stenosis   . HLD (hyperlipidemia)   . Benign neoplasm of colon   . History of nephrolithiasis   . Other testicular hypofunction   . Aortic insufficiency   . Aortic root enlargement     thoracic aorta  . Heart murmur 2014  . Obstructive sleep apnea (adult) (pediatric)     pt. doesn't use his machine at home.  . Pulmonary nodule, left 2015    2cm, ?sarcoid by biopsy    Past Surgical History  Procedure Laterality Date  . Rhinoplasty  1980s  . Knee surgery Right 05/2012    arthroscopic  . Medial partial knee replacement Right 03/2013  . Knee surgery Right 12/2013    patella  . Lithotripsy    . Lung biopsy Left 2015    by IR - benign  . Cataract extraction Bilateral     History  Smoking status  . Never Smoker   Smokeless tobacco  . Never Used    History  Alcohol Use No    Family History  Problem Relation Age of Onset  . CAD Father 80    MI  . Heart disease Brother   . Heart disease Brother   . Heart disease Brother   .  Cancer Neg Hx     Review of Systems: As noted in history of present illness. All other systems were reviewed and are negative.  Physical Exam: BP 132/82  Pulse 96  Ht 6' (1.829 m)  Wt 201 lb (91.173 kg)  BMI 27.25 kg/m2 He is a pleasant white male in no acute distress. HEENT: Normal Neck is without JVD, adenopathy, thyromegaly, or bruits. Carotid upstrokes are normal. Lungs: Clear Cardiovascular: Regular rate and rhythm. Normal S1 and S2. There is a grade 2/6 systolic ejection murmur the right upper sternal border. There is a grade 1/6 diastolic murmur at the left sternal border. PMI is normal. No gallop. Abdomen: Soft and nontender. No masses or bruits. No hepatosplenomegaly. Bowel sounds are positive. Extremities: No cyanosis or edema. Pulses are 2+ and symmetric. Neuro: Alert and oriented x3. Cranial nerves II through XII are intact.  LABORATORY DATA: Lab  Results  Component Value Date   WBC 6.9 04/17/2014   HGB 13.7 04/17/2014   HCT 41.0 04/17/2014   PLT 158 04/17/2014   GLUCOSE 100* 02/26/2014   CHOL 140 02/27/2013   TRIG 121.0 02/27/2013   HDL 34.80* 02/27/2013   LDLCALC 81 02/27/2013   ALT 27 12/02/2011   AST 26 12/02/2011   NA 139 02/26/2014   K 4.1 02/26/2014   CL 105 02/26/2014   CREATININE 1.1 02/26/2014   BUN 26* 02/26/2014   CO2 28 02/26/2014   TSH 1.28 12/02/2011   PSA 1.34 11/23/2013   INR 0.96 04/17/2014   Echo: Study Conclusions  - Left ventricle: The cavity size was normal. Systolic function was normal. The estimated ejection fraction was in the range of 60% to 65%. Wall motion was normal; there were no regional wall motion abnormalities. Doppler parameters are consistent with abnormal left ventricular relaxation (grade 1 diastolic dysfunction). Doppler parameters are consistent with high ventricular filling pressure. - Aortic valve: A bicuspid morphology cannot be excluded; moderately thickened, severely calcified leaflets. Cusp separation was reduced. There was severe stenosis. Peak velocity (S): 404 cm/s. Mean gradient (S): 42 mm Hg. - Aorta: Aortic root dimension: 38 mm (ED) at the sinus of Valsalva, 4.9cm at sinotubular junction and 5.4cm ascending root). - Mitral valve: There was mild regurgitation. - Left atrium: The atrium was mildly dilated. - Pulmonary arteries: PA peak pressure: 32 mm Hg (S).  Impressions:  - Aortic stenosis is severe (?bicuspid valve). Aortic root enlarged from last evaluation (5.4 from 5.1cm). There is no significant aortic regurgitation.     Assessment / Plan: 1. Aortic stenosis - severe. He has symptoms of DOE and fatigue on exertion. It is difficult to sort out how much of his symptoms are cardiac versus pulmonary but his asthma has been maximally treated and he is still symptomatic.  We will proceed with right and left heart cath to evaluate further with plans for AVR and aortic root grafting in the  near future. Cardiac cath planned for [place]. This week. The procedure and risks were reviewed including but not limited to death, myocardial infarction, stroke, arrythmias, bleeding, transfusion, emergency surgery, dye allergy, or renal dysfunction. The patient voices understanding and is agreeable to proceed..  2. Thoracic aortic aneurysm. This measures 5.1 cm.   3. Hypertension-continue amlodipine 5 mg daily.  4. 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 Dr. Lenna Meadows suggests possible wedge resection at the time of AVR. Will defer to Dr. [name].

## 2014-05-20 NOTE — Patient Instructions (Signed)
We will schedule you for a cardiac cath.

## 2014-05-21 ENCOUNTER — Other Ambulatory Visit: Payer: Self-pay | Admitting: Pulmonary Disease

## 2014-05-21 DIAGNOSIS — R911 Solitary pulmonary nodule: Secondary | ICD-10-CM

## 2014-05-22 ENCOUNTER — Encounter (HOSPITAL_COMMUNITY)
Admission: RE | Disposition: A | Discharge status: Home / Self Care | Payer: Self-pay | Source: Ambulatory Visit | Attending: Cardiology

## 2014-05-22 ENCOUNTER — Ambulatory Visit (HOSPITAL_COMMUNITY)
Admission: RE | Admit: 2014-05-22 | Discharge: 2014-05-22 | Disposition: A | Discharge status: Home / Self Care | Payer: Medicare HMO | Source: Ambulatory Visit | Attending: Cardiology | Admitting: Cardiology

## 2014-05-22 DIAGNOSIS — Z79899 Other long term (current) drug therapy: Secondary | ICD-10-CM | POA: Diagnosis not present

## 2014-05-22 DIAGNOSIS — J8410 Pulmonary fibrosis, unspecified: Secondary | ICD-10-CM | POA: Insufficient documentation

## 2014-05-22 DIAGNOSIS — G4733 Obstructive sleep apnea (adult) (pediatric): Secondary | ICD-10-CM | POA: Insufficient documentation

## 2014-05-22 DIAGNOSIS — E785 Hyperlipidemia, unspecified: Secondary | ICD-10-CM | POA: Insufficient documentation

## 2014-05-22 DIAGNOSIS — I350 Nonrheumatic aortic (valve) stenosis: Secondary | ICD-10-CM

## 2014-05-22 DIAGNOSIS — I10 Essential (primary) hypertension: Secondary | ICD-10-CM | POA: Insufficient documentation

## 2014-05-22 DIAGNOSIS — I359 Nonrheumatic aortic valve disorder, unspecified: Secondary | ICD-10-CM | POA: Diagnosis not present

## 2014-05-22 DIAGNOSIS — I712 Thoracic aortic aneurysm, without rupture, unspecified: Secondary | ICD-10-CM | POA: Diagnosis not present

## 2014-05-22 DIAGNOSIS — I2510 Atherosclerotic heart disease of native coronary artery without angina pectoris: Secondary | ICD-10-CM | POA: Diagnosis not present

## 2014-05-22 DIAGNOSIS — H409 Unspecified glaucoma: Secondary | ICD-10-CM | POA: Diagnosis not present

## 2014-05-22 HISTORY — PX: LEFT AND RIGHT HEART CATHETERIZATION WITH CORONARY ANGIOGRAM: SHX5449

## 2014-05-22 SURGERY — LEFT AND RIGHT HEART CATHETERIZATION WITH CORONARY ANGIOGRAM
Anesthesia: LOCAL

## 2014-05-22 MED ORDER — SODIUM CHLORIDE 0.9 % IJ SOLN: 3.0000 mL | Freq: Two times a day (BID) | INTRAMUSCULAR | Status: DC

## 2014-05-22 MED ORDER — SODIUM CHLORIDE 0.9 % IJ SOLN: 3.0000 mL | INTRAMUSCULAR | Status: DC | PRN

## 2014-05-22 MED ORDER — SODIUM CHLORIDE 0.9 % IV SOLN: INTRAVENOUS | Status: DC

## 2014-05-22 MED ORDER — SODIUM CHLORIDE 0.9 % IV SOLN: 250.0000 mL | INTRAVENOUS | Status: DC | PRN

## 2014-05-22 MED ORDER — SODIUM CHLORIDE 0.9 % IV SOLN
INTRAVENOUS | Status: DC
  Administered 2014-05-22: 1000 mL via INTRAVENOUS

## 2014-05-22 MED ORDER — ASPIRIN 81 MG PO CHEW
CHEWABLE_TABLET | ORAL | Status: AC
  Administered 2014-05-22: 81 mg via ORAL
  Filled 2014-05-22: qty 1

## 2014-05-22 MED ORDER — ASPIRIN 81 MG PO CHEW
81.0000 mg | CHEWABLE_TABLET | ORAL | Status: AC
  Administered 2014-05-22: 81 mg via ORAL

## 2014-05-22 NOTE — Discharge Instructions (Signed)
Arteriogram Care After These instructions give you information on caring for yourself after your procedure. Your doctor may also give you more specific instructions. Call your doctor if you have any problems or questions after your procedure. HOME CARE  Keep your leg straight for at least 6 hours.  Do not bathe, swim, or use a hot tub until directed by your doctor. You can shower.  Do not lift anything heavier than 10 pounds (about a gallon of milk) for 2 days.  Do not walk a lot, run, or drive for 2 days.  Return to normal activities in 2 days or as told by your doctor. Finding out the results of your test Ask when your test results will be ready. Make sure you get your test results. GET HELP RIGHT AWAY IF:   You have fever.  You have more pain in your leg.  The leg that was cut is:  Bleeding.  Puffy (swollen) or red.  Cold.  Pale or changes color.  Weak.  Tingly or numb. If you go to the Emergency Room, tell your nurse that you have had an arteriogram. Take this paper with you to show the nurse. MAKE SURE YOU:  Understand these instructions.  Will watch your condition.  Will get help right away if you are not doing well or get worse. Document Released: 12/10/2008 Document Revised: 09/18/2013 Document Reviewed: 12/10/2008 [facility] Patient Information 2015 [place], [place]. This information is not intended to replace advice given to you by your health care provider. Make sure you discuss any questions you have with your health care provider.      Radial Site Care Refer to this sheet in the next few weeks. These instructions provide you with information on caring for yourself after your procedure. Your caregiver may also give you more specific instructions. Your treatment has been planned according to current medical practices, but problems sometimes occur. Call your caregiver if you have any problems or questions after your procedure. HOME CARE INSTRUCTIONS  You  may shower the day after the procedure.Remove the bandage (dressing) and gently wash the site with plain soap and water.Gently pat the site dry.  Do not apply powder or lotion to the site.  Do not submerge the affected site in water for 3 to 5 days.  Inspect the site at least twice daily.  Do not flex or bend the affected arm for 24 hours.  No lifting over 5 pounds (2.3 kg) for 5 days after your procedure.  Do not drive home if you are discharged the same day of the procedure. Have someone else drive you.  You may drive 24 hours after the procedure unless otherwise instructed by your caregiver.  Do not operate machinery or power tools for 24 hours.  A responsible adult should be with you for the first 24 hours after you arrive home. What to expect:  Any bruising will usually fade within 1 to 2 weeks.  Blood that collects in the tissue (hematoma) may be painful to the touch. It should usually decrease in size and tenderness within 1 to 2 weeks. SEEK IMMEDIATE MEDICAL CARE IF:  You have unusual pain at the radial site.  You have redness, warmth, swelling, or pain at the radial site.  You have drainage (other than a small amount of blood on the dressing).  You have chills.  You have a fever or persistent symptoms for more than 72 hours.  You have a fever and your symptoms suddenly get worse.  Your arm becomes pale, cool, tingly, or numb.  You have heavy bleeding from the site. Hold pressure on the site. Document Released: 10/16/2010 Document Revised: 12/06/2011 Document Reviewed: 10/16/2010 [facility] Patient Information 2015 [place], [place]. This information is not intended to replace advice given to you by your health care provider. Make sure you discuss any questions you have with your health care provider.

## 2014-05-22 NOTE — Progress Notes (Signed)
TR BAND REMOVAL  LOCATION:    right radial  DEFLATED PER PROTOCOL:    Yes.    TIME BAND OFF / DRESSING APPLIED:    1630   SITE UPON ARRIVAL:    Level 0  SITE AFTER BAND REMOVAL:    Level 0  CIRCULATION SENSATION AND MOVEMENT:    Within Normal Limits   Yes.    COMMENTS:   TEGADERM DSG APPLIED.

## 2014-05-22 NOTE — CV Procedure (Signed)
    Cardiac Catheterization Procedure Note  Name: SUBHAN HOOPES MRN: [id number] DOB: 06/22/1946  Procedure: Right Heart Cath, Left Heart Cath, Selective Coronary Angiography, aortic root angiography  Indication: 68 yo WM with severe aortic stenosis and thoracic aortic aneurysm.   Procedural Details: The right wrist was prepped, draped, and anesthetized with 1% lidocaine. Using the modified Seldinger technique a 6 Fr slender sheath was placed in the right radial artery and a 5 French sheath was placed in the right brachial vein. A Swan-Ganz catheter was used for the right heart catheterization. Standard protocol was followed for recording of right heart pressures and sampling of oxygen saturations. Fick cardiac output was calculated. Due to extreme leftward origin of the innominate artery, aortic root dilitation and aortic stenosis jet I was unable to access the coronary arteries from the radial approach. The left groin was prepped and draped in a sterile fashion. The left groin was anesthetized with 1% lidocaine. Using the modified Seldinger technique a 5 Fr sheath was inserted in the left femoral artery. The left coronary artery was engaged with a LA2 catheter. The RCA was engaged with a Noto catheter. The LA 2 catheter briefly crossed the Aortic valve and pressures were recorded.  There were no immediate procedural complications. The patient was transferred to the post catheterization recovery area for further monitoring.  Procedural Findings: Hemodynamics RA 9/8 mean 6 mm Hg RV 35/5 mm Hg PA 30/15  Mean 23 mm Hg PCWP 18/15 mean 13 mm Hg LV 178/21 mm Hg AO 116/83 mean 98 mm Hg  AV peak gradient 60 mm Hg, mean gradient 28 mm Hg AV area 1.3 cm squared with index 0.6.   Oxygen saturations: PA 74% AO 93%  Cardiac Output (Fick) 7.1 L/min   Cardiac Index (Fick) 3.37 L/min/meter Squared.   Coronary angiography: Coronary dominance: left  Left mainstem: Short, normal  Left anterior  descending (LAD):  Focal 20-30% mid vessel. Otherwise normal.  Left circumflex (LCx): large dominant vessel. Normal.   Right coronary artery (RCA): 50% ostial stenosis. Small nondominant vessel.   Left ventriculography: Not done  Aortic root angiography: Marked enlargement of the ascending aorta.  Final Conclusions:   1. No significant obstructive CAD 2. Left dominant circulation 3. Normal right heart pressures. 4. Moderate to severe aortic stenosis. 5. Ascending thoracic aneurysm.  Recommendations: Recommend AVR with aortic root grafting.    Dominyck Reser [name], [place] 05/22/2014, 1:12 PM

## 2014-05-22 NOTE — Progress Notes (Addendum)
Site:rt brachial Site prior to removal: Level  0 Pressure applied for:10   Manual:yes Patient status during pull:stable Post pull site: Level  0 Post pull instructions given:tes Post pull pulses present:strong Dressing applied:1405 Bedrest begins @  1405Site: Comment: urinalm 521ml

## 2014-05-22 NOTE — Progress Notes (Signed)
Site:rt groin Site prior to removal: Level 0  Pressure applied for:   Manual:yes Patient status during pull:stable Post pull site: Level  0 Post pull instructions given:yes Post pull pulses present:strong Dressing applied:1350 Bedrest begins @  1350 Comments:

## 2014-05-22 NOTE — H&P (View-Only) (Signed)
I will  Jeremy Meadows Date of Birth: 1946/06/21 Medical Record #[id number]  History of Present Illness: Jeremy Meadows is seen for followup of aortic stenosis and  thoracic aortic aneurysm. On his last visit we performed an Echo that showed severe aortic stenosis with peak velocity of 4 m/sec and mean gradient of 42 mm Hg. The proximal aorta was aneurysmal with diameter of 5.1 cm. He was also noted to have a LLL pulmonary nodule. PET scan showed low metabolic activity. 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 He was seen by Jeremy Meadows who felt he did not have sarcoidosis. He was treated for acute asthmatic bronchitis in July treated with steroids and inhalers with improvement. He still notes that he gets tired easily and SOB when walking back from his barn. He denies any chest pain or palpitations.  Current Outpatient Prescriptions on File Prior to Visit  Medication Sig Dispense Refill  . albuterol (VENTOLIN HFA) 108 (90 BASE) MCG/ACT inhaler Inhale 2 puffs into the lungs every 6 (six) hours as needed for wheezing or shortness of breath.  1 Inhaler  0  . amLODipine (NORVASC) 5 MG tablet Take 5 mg by mouth every evening.      . brinzolamide (AZOPT) 1 % ophthalmic suspension Place 1 drop into both eyes 2 (two) times daily.      . cholecalciferol (VITAMIN D) 1000 UNITS tablet Take 1,000 Units by mouth daily.      . Fluticasone Furoate-Vilanterol (BREO ELLIPTA) 100-25 MCG/INH AEPB Inhale 2 puffs into the lungs daily.  1 each  11  . MELATONIN PO Take 6 mg by mouth at bedtime.      . Multiple Vitamin (MULTIVITAMIN) capsule Take 1 capsule by mouth daily.      . simvastatin (ZOCOR) 20 MG tablet Take 1 tablet (20 mg total) by mouth every evening.  90 tablet  3  . vitamin C (ASCORBIC ACID) 500 MG tablet Take 500 mg by mouth 2 (two) times daily.       No current facility-administered medications on file prior to visit.    No Known Allergies  Past Medical History  Diagnosis Date   . Unspecified glaucoma   . Allergic rhinitis, cause unspecified   . Aortic valve stenosis   . HLD (hyperlipidemia)   . Benign neoplasm of colon   . History of nephrolithiasis   . Other testicular hypofunction   . Aortic insufficiency   . Aortic root enlargement     thoracic aorta  . Heart murmur 2014  . Obstructive sleep apnea (adult) (pediatric)     pt. doesn't use his machine at home.  . Pulmonary nodule, left 2015    2cm, ?sarcoid by biopsy    Past Surgical History  Procedure Laterality Date  . Rhinoplasty  1980s  . Knee surgery Right 05/2012    arthroscopic  . Medial partial knee replacement Right 03/2013  . Knee surgery Right 12/2013    patella  . Lithotripsy    . Lung biopsy Left 2015    by IR - benign  . Cataract extraction Bilateral     History  Smoking status  . Never Smoker   Smokeless tobacco  . Never Used    History  Alcohol Use No    Family History  Problem Relation Age of Onset  . CAD Father 96    MI  . Heart disease Brother   . Heart disease Brother   . Heart disease Brother   .  Cancer Neg Hx     Review of Systems: As noted in history of present illness. All other systems were reviewed and are negative.  Physical Exam: BP 132/82  Pulse 96  Ht 6' (1.829 m)  Wt 201 lb (91.173 kg)  BMI 27.25 kg/m2 He is a pleasant white male in no acute distress. HEENT: Normal Neck is without JVD, adenopathy, thyromegaly, or bruits. Carotid upstrokes are normal. Lungs: Clear Cardiovascular: Regular rate and rhythm. Normal S1 and S2. There is a grade 2/6 systolic ejection murmur the right upper sternal border. There is a grade 1/6 diastolic murmur at the left sternal border. PMI is normal. No gallop. Abdomen: Soft and nontender. No masses or bruits. No hepatosplenomegaly. Bowel sounds are positive. Extremities: No cyanosis or edema. Pulses are 2+ and symmetric. Neuro: Alert and oriented x3. Cranial nerves II through XII are intact.  LABORATORY DATA: Lab  Results  Component Value Date   WBC 6.9 04/17/2014   HGB 13.7 04/17/2014   HCT 41.0 04/17/2014   PLT 158 04/17/2014   GLUCOSE 100* 02/26/2014   CHOL 140 02/27/2013   TRIG 121.0 02/27/2013   HDL 34.80* 02/27/2013   LDLCALC 81 02/27/2013   ALT 27 12/02/2011   AST 26 12/02/2011   NA 139 02/26/2014   K 4.1 02/26/2014   CL 105 02/26/2014   CREATININE 1.1 02/26/2014   BUN 26* 02/26/2014   CO2 28 02/26/2014   TSH 1.28 12/02/2011   PSA 1.34 11/23/2013   INR 0.96 04/17/2014   Echo: Study Conclusions  - Left ventricle: The cavity size was normal. Systolic function was normal. The estimated ejection fraction was in the range of 60% to 65%. Wall motion was normal; there were no regional wall motion abnormalities. Doppler parameters are consistent with abnormal left ventricular relaxation (grade 1 diastolic dysfunction). Doppler parameters are consistent with high ventricular filling pressure. - Aortic valve: A bicuspid morphology cannot be excluded; moderately thickened, severely calcified leaflets. Cusp separation was reduced. There was severe stenosis. Peak velocity (S): 404 cm/s. Mean gradient (S): 42 mm Hg. - Aorta: Aortic root dimension: 38 mm (ED) at the sinus of Valsalva, 4.9cm at sinotubular junction and 5.4cm ascending root). - Mitral valve: There was mild regurgitation. - Left atrium: The atrium was mildly dilated. - Pulmonary arteries: PA peak pressure: 32 mm Hg (S).  Impressions:  - Aortic stenosis is severe (?bicuspid valve). Aortic root enlarged from last evaluation (5.4 from 5.1cm). There is no significant aortic regurgitation.     Assessment / Plan: 1. Aortic stenosis - severe. He has symptoms of DOE and fatigue on exertion. It is difficult to sort out how much of his symptoms are cardiac versus pulmonary but his asthma has been maximally treated and he is still symptomatic.  We will proceed with right and left heart cath to evaluate further with plans for AVR and aortic root grafting in the  near future. Cardiac cath planned for [place]. This week. The procedure and risks were reviewed including but not limited to death, myocardial infarction, stroke, arrythmias, bleeding, transfusion, emergency surgery, dye allergy, or renal dysfunction. The patient voices understanding and is agreeable to proceed..  2. Thoracic aortic aneurysm. This measures 5.1 cm.   3. Hypertension-continue amlodipine 5 mg daily.  4. 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 Jeremy Meadows suggests possible wedge resection at the time of AVR. Will defer to Dr. [name].

## 2014-05-22 NOTE — Interval H&P Note (Signed)
History and Physical Interval Note:  05/22/2014 11:45 AM  Jeremy Meadows  has presented today for surgery, with the diagnosis of as  The various methods of treatment have been discussed with the patient and family. After consideration of risks, benefits and other options for treatment, the patient has consented to  Procedure(s): LEFT AND RIGHT HEART CATHETERIZATION WITH CORONARY ANGIOGRAM (N/A) as a surgical intervention .  The patient's history has been reviewed, patient examined, no change in status, stable for surgery.  I have reviewed the patient's chart and labs.  Questions were answered to the patient's satisfaction.     [name] 05/22/2014 11:45 AM Cath Lab Visit (complete for each Cath Lab visit)  Clinical Evaluation Leading to the Procedure:   ACS: No.  Non-ACS:    Anginal Classification: CCS II  Anti-ischemic medical therapy: Minimal Therapy (1 class of medications)  Non-Invasive Test Results: No non-invasive testing performed  Prior CABG: No previous CABG

## 2014-05-23 ENCOUNTER — Encounter: Payer: Self-pay | Admitting: Family Medicine

## 2014-05-23 ENCOUNTER — Telehealth: Payer: Self-pay | Admitting: Pulmonary Disease

## 2014-05-23 ENCOUNTER — Ambulatory Visit (INDEPENDENT_AMBULATORY_CARE_PROVIDER_SITE_OTHER): Payer: Commercial Managed Care - HMO | Admitting: Cardiothoracic Surgery

## 2014-05-23 VITALS — BP 129/96 | HR 101 | Ht 72.0 in | Wt 201.0 lb

## 2014-05-23 DIAGNOSIS — Z9889 Other specified postprocedural states: Secondary | ICD-10-CM

## 2014-05-23 NOTE — Patient Instructions (Addendum)
Thoracic Aortic Aneurysm An aneurysm is a bulge in an artery. It happens when the wall of the artery is weakened or damaged. If the aneurysm gets too big, it bursts (ruptures) and severe bleeding occurs. A thoracic aortic aneurysm is an aneurysm that occurs in the first part of the aorta, between the heart and the diaphragm. The aorta is the main artery and supplies blood from the heart to the rest of the body. A thoracic aortic aneurysm can enlarge and rupture or blood can flow between the layers of the wall of the aorta through a tear (aorticdissection). Both of these conditions can cause bleeding inside the body and can be life threatening unless diagnosed and treated promptly. CAUSES  The exact cause of a thoracic aortic aneurysm is often unknown. Some contributing factors are:   A hardening of the arteries caused by the buildup of fat and other substances in the lining of a blood vessel (arteriosclerosis).  Inflammation of the walls of an artery (arteritis).  Connective tissue diseases, such as Marfan syndrome.  Injury or trauma to the aorta.  An infection, such as syphilis or staphylococcus, in the wall of the aorta (infectious aortitis) caused by bacteria. RISK FACTORS  Risk factors that contribute to a thoracic aortic aneurysm may include:  Age older than 19 years.  High blood pressure (hypertension).  Male gender.  Ethnicity (white race).  Obesity.  Family history of aneurysm (first degree relatives only).  Tobacco use. PREVENTION  The following healthy lifestyle habits may help decrease your risk of a thoracic aortic aneurysm:  Quitting smoking. Smoking can raise your blood pressure and cause arteriosclerosis.  Limiting or avoiding alcohol.  Keeping your blood pressure, blood sugar level, and cholesterol levels within normal limits.  Decreasing your salt intake. In some people, too much salt can raise blood pressure and increase your risk of abdominal aortic  aneurysm.  Eating a diet low in saturated fats and cholesterol.  Increasing your fiber intake by including whole grains, vegetables, and fruits in your diet. Eating these foods may help lower blood pressure.  Maintaining a healthy weight.  Staying physically active and exercising regularly. SYMPTOMS  The symptoms of thoracic aortic aneurysm may vary depending on the size and rate of growth of the aneurysm. Most grow slowly and do not have any symptoms. When symptoms do occur, they may include:  Pain (chest, back, sides, or abdomen). The pain may vary in intensity. A sudden onset of severe pain may indicate that the aneurysm has ruptured.  Hoarseness.  Cough.  Shortness of breath.  Swallowing problems.  Nausea or vomiting or both. DIAGNOSIS  Since most unruptured thoracic aortic aneurysms have no symptoms, they are often discovered during diagnostic exams for other conditions. An aneurysm may be found during the following procedures:  Ultrasonography (a one-time screening for thoracic aortic aneurysm by ultrasonography is also recommended for all men aged 63-75 years who have ever smoked).  X-ray exams.  A CT scan.  An MRI.  Angiography or arteriography. TREATMENT  Treatment of a thoracic aortic aneurysm depends on the size of your aneurysm, your age, and risk factors for rupture. Medicine to control blood pressure and pain may be used to manage aneurysms smaller than 2.3 in (6 cm). Regular monitoring for enlargement may be recommended by your health care provider if:  The aneurysm is 1.2-1.5 in (3-4 cm) in size (an annual ultrasonography may be recommended).  The aneurysm is 1.5-1.8 in (4-4.5 cm) in size (an ultrasonography every 6  months may be recommended).  The aneurysm is larger than 1.8 in (4.5 cm) in size (your health care provider may ask that you be examined by a vascular surgeon). If your aneurysm is larger than 2.2 in (5.5 cm) or if it is enlarging quickly,  surgical repair may be recommended. There are two main methods for repair of an aneurysm:   Endovascular repair (a minimally invasive surgery).  Open repair. This method is used if an endovascular repair is not possible. Document Released: 09/13/2005 Document Revised: 07/04/2013 Document Reviewed: 03/26/2013 [facility] Patient Information 2015 [place], [place]. This information is not intended to replace advice given to you by your health care provider. Make sure you discuss any questions you have with your health care provider. Aortic Stenosis Aortic stenosis is a narrowing of the aortic valve. The aortic valve is a gatelike structure that is located between the lower left chamber of the heart (left ventricle) and the blood vessel that leads away from the heart (aorta). When the aortic valve is narrowed, it does not open all the way. This makes it hard for the heart to pump blood into the aorta and causes the heart to work harder. The extra work can weaken the heart over time and lead to heart failure. CAUSES  Causes of aortic stenosis include:  Calcium deposits on the aortic valve that have made the valve stiff. This condition generally affects those over the age of 30. It is the most common cause of aortic stenosis.  A birth defect.  Rheumatic fever. This is a problem that may occur after a strep throat infection that was not treated adequately. Rheumatic fever can cause permanent damage to heart valves. SIGNS AND SYMPTOMS  People with aortic stenosis usually have no symptoms until the condition becomes severe. It may take 10-20 years for mild or moderate aortic stenosis to become severe. Symptoms may include:   Shortness of breath, especially with physical activity.   Feeling weak and tired (fatigued) or getting tired easily.  Chest discomfort (angina). This may occur with minimal activity if the aortic stenosis is severe.  An irregular or faster-than-normal heartbeat.  Dizziness or  fainting that happens with exertion or after taking certain heart medicines (such as nitroglycerin). DIAGNOSIS  Aortic stenosis is usually diagnosed with a physical exam and with a type of imaging test called echocardiography. During echocardiography, sound waves are used to evaluate how blood flows through the heart. If your health care provider suspects aortic stenosis but the test does not clearly show it, a procedure called cardiac catheterization may be done to diagnose the condition. Tests may also be done to evaluate heart function. They may include:  Electrocardiography. During this test, the electrical impulses of the heart are recorded while you are lying down and sticky patches are placed on your chest, arms, and legs.  Stress tests. There is more than one type of stress test. If a stress test is needed, ask your health care provider about which type is best for you.  Blood tests. TREATMENT  Treatment depends on how severe the aortic stenosis is, your symptoms, and the problems it is causing.   Observation. If the aortic stenosis is mild, no treatment may be needed. However, you will need to have the condition checked regularly to make sure it is not getting worse or causing serious problems.  Surgery. Surgery to repair or replace the aortic valve is the most common treatment for aortic stenosis. Several types of surgeries are available. The most  common are open-heart surgery and transcutaneous aortic valve replacement (TAVR). TAVR does not require that the chest be opened. It is usually performed on elderly patients and those who are not able to have open-heart surgery.  Medicines. Medicines may be given to keep symptoms from getting worse. Medicines cannot reverse aortic stenosis. HOME CARE INSTRUCTIONS   You may need to avoid certain types of physical activity. If your aortic stenosis is mild, you may need to avoid only strenuous activity. The more severe your aortic stenosis, the  more activities you will need to avoid. Talk with your health care provider about the types of activity you should avoid.  Take medicines only as directed by your health care provider.  If you are a woman with aortic valve stenosis and want to get pregnant, talk to your health care provider before you become pregnant.  If you are a woman with aortic valve stenosis and are pregnant, keep all follow-up visits with all recommended health care providers.  Keep all follow-up visits for tests, exams, and treatments as directed by your health care provider. SEEK IMMEDIATE MEDICAL CARE IF:  You develop chest pain or tightness.   You develop shortness of breath or difficulty breathing.   You develop light-headedness or faint.   It feels like your heartbeat is irregular or faster than normal.  You have a fever. Document Released: 06/12/2003 Document Revised: 01/28/2014 Document Reviewed: 09/07/2012 [facility] Patient Information 2015 [place], [place]. This information is not intended to replace advice given to you by your health care provider. Make sure you discuss any questions you have with your health care provider. Aortic Valve Replacement  Aortic valve replacement is a procedure to replace an aortic valve that cannot be repaired. An artificial (prosthetic) valve is used to do this. Three types of prosthetic valves are available:   Mechanical valves made entirely from man-made materials.   Donor valves made from human donors. These are only used in special situations.   Biological valves made from animal tissues.  The type of prosthetic valve used will be determined based on various factors, including your age, your lifestyle, and other medical conditions you have.  LET [facility] CARE PROVIDER KNOW ABOUT:  Any allergies you have.  All medicines you are taking, including vitamins, herbs, eye drops, creams, and over-the-counter medicines.  Previous problems you or members of your  family have had with the use of anesthetics.  Any blood disorders you have.  Previous surgeries you have had.  Medical conditions you have. RISKS AND COMPLICATIONS Generally, this is a safe procedure. However, as with any procedure, problems can occur. Possible problems include:   Blood clotting caused by the new valve. Replacement with a mechanical valve requires lifelong treatment with medicine to prevent blood clots.   Infection in the new valve.   Valve failure.   Bleeding.   Reaction to anesthetics.  BEFORE THE PROCEDURE  Ask your health care provider about:  Changing or stopping your regular medicines. This is especially important if you are taking diabetes medicines or blood thinners.  Taking medicines such as aspirin and ibuprofen. These medicines can thin your blood. Do not take these medicines before your procedure if your health care provider asks you not to.  Do not eat or drink anything after midnight on the night before the procedure or as directed by your health care provider. PROCEDURE  The surgeon may use either an open technique or a minimally invasive technique for this surgery:  Traditional open  surgery  You will be given a medicine that makes you fall asleep (general anesthetic).  You will then be placed on a heart-lung bypass machine. This machine provides oxygen to your blood while the heart is undergoing surgery.  During surgery, the surgeon will make a large cut (incision) in the chest.  The heart will be cooled to slow or stop the heartbeat.  The damaged aortic valve will be removed and replaced with a prosthetic heart valve.  The incision will then be closed with staples or stitches. AFTER THE PROCEDURE You will be monitored closely in a recovery area. From there, you will likely go to an intensive care unit.  Document Released: 02/02/2005 Document Revised: 01/28/2014 Document Reviewed: 02/20/2013 [facility] Patient Information 2015  [place], [place]. This information is not intended to replace advice given to you by your health care provider. Make sure you discuss any questions you have with your health care provider.

## 2014-05-23 NOTE — Telephone Encounter (Signed)
Spoke with [place] at [place] and she states they are working on getting everything sent to insurance today.  Pt updated on this status and told he should be hearing from them soon.

## 2014-05-23 NOTE — Progress Notes (Signed)
[address]       [place] [address]             [place] Record #[id number] Date of Birth: 1946-05-31  Referring: [name], Peter M, MD Primary Care: Ria Bush, MD  Chief Complaint:    Chief Complaint  Patient presents with  . F/U THORACIC    3-4 WK F/U    History of Present Illness:    Jeremy Meadows 68 y.o. male is seen in the office  today for followup after CT guided needle biopsy of new bilobular left lower lobe lung lesion. Needle biopsy reveals:  Lung, needle/core biopsy(ies), LLL - NONCASEATING GRANULOMATOUS INFLAMMATION. - FIBROBLASTIC FOCI. - THERE IS NO EVIDENCE OF MALIGNANCY. - SEE COMMENT. Microscopic Comment The differential diagnosis includes sarcoidosis. Malignant features are not identified. Clinical and radiologic correlation is recommended.   Most exactly a year ago the patient was noted by Dr. Lenna Gilford  have a murmur. Evaluation for this included a CT scan of the chest which demonstrated a 5.1 cm dilated ascending aorta, echocardiogram showed severe aortic stenosis with a peak velocity across the aortic valve of 396 mean gradient of 41 and peak gradient of 65. The patient return one year later to see Dr. Peter [name] CT scan of the chest was done to measure the size of his aorta and a new left lower lobe lung nodule was noted. The patient was referred to thoracic surgery for workup of the lung nodule. The patient has had pulmonary function studies done . He does have a history of adult onset asthma , for which he uses bronchodilators intermittently and has for 25 or 30 years. Currently he is on albuterol as a rescue inhaler he does note that his symptoms have been getting worse and is using the inhaler 6 or 8 times a day. In addition he notes that he wakes up at night short of breath and wheezing.   The patient's never been a smoker, retired from [employer], no  history of asbestos exposure.  The patient denies syncope denies angina denies presyncope, he does wake up at night wheezing, he does note shortness of breath with exertion.  He denies diabetes does have hypertension and takes a statin for lipid treatment.   Patient underwent cardiac catheterization earlier this week.  Current Activity/ Functional Status:  Patient is independent with mobility/ambulation, transfers, ADL's, IADL's.   Zubrod Score: At the time of surgery this patient's most appropriate activity status/level should be described as: [x]     0    Normal activity, no symptoms []     1    Restricted in physical strenuous activity but ambulatory, able to do out light work []     2    Ambulatory and capable of self care, unable to do work activities, up and about               >50 % of waking hours                              []     3    Only limited self care, in bed greater than 50% of waking hours []     4    Completely disabled, no self care, confined to bed or chair []   5    Moribund   Past Medical History  Diagnosis Date  . Unspecified glaucoma   . Allergic rhinitis, cause unspecified   . Aortic valve stenosis   . HLD (hyperlipidemia)   . Benign neoplasm of colon   . History of nephrolithiasis   . Other testicular hypofunction   . Aortic insufficiency   . Aortic root enlargement     thoracic aorta  . Heart murmur 2014  . Obstructive sleep apnea (adult) (pediatric)     pt. doesn't use his machine at home.  . Pulmonary nodule, left 2015    2cm, ?sarcoid by biopsy    Past Surgical History  Procedure Laterality Date  . Rhinoplasty  1980s  . Knee surgery Right 05/2012    arthroscopic  . Medial partial knee replacement Right 03/2013  . Knee surgery Right 12/2013    patella  . Lithotripsy    . Lung biopsy Left 2015    by IR - benign  . Cataract extraction Bilateral   . Cardiac catheterization  04/2014    no significant obstructive CAD    Family History    Problem Relation Age of Onset  . CAD Father 28    MI  . Heart disease Brother   . Heart disease Brother   . Heart disease Brother   . Cancer Neg Hx    patient's family history significant for his father died at age 7 suddenly, told it was a heart attack but no confirmation of this, mother died in her 31s of carcinoma of the jaw Patient has 3 brothers the oldest has had carotid artery disease, no Brother coronary artery bypass grafting, youngest brother has had coronary stents most recently 3 weeks ago. Patient has 2 sons are healthy     History  Smoking status  . Never Smoker   Smokeless tobacco  . Never Used    History  Alcohol Use No     No Known Allergies  Current Outpatient Prescriptions  Medication Sig Dispense Refill  . albuterol (VENTOLIN HFA) 108 (90 BASE) MCG/ACT inhaler Inhale 2 puffs into the lungs every 6 (six) hours as needed for wheezing or shortness of breath.  1 Inhaler  0  . amLODipine (NORVASC) 5 MG tablet Take 5 mg by mouth every evening.      . brinzolamide (AZOPT) 1 % ophthalmic suspension Place 1 drop into both eyes 2 (two) times daily.      . cholecalciferol (VITAMIN D) 1000 UNITS tablet Take 1,000 Units by mouth daily.      . Fluticasone Furoate-Vilanterol (BREO ELLIPTA) 100-25 MCG/INH AEPB Inhale 2 puffs into the lungs daily.  1 each  11  . MELATONIN PO Take 6 mg by mouth at bedtime.      . Multiple Vitamin (MULTIVITAMIN) capsule Take 1 capsule by mouth daily.      . simvastatin (ZOCOR) 20 MG tablet Take 1 tablet (20 mg total) by mouth every evening.  90 tablet  3  . vitamin C (ASCORBIC ACID) 500 MG tablet Take 500 mg by mouth 2 (two) times daily.      [name] ibuprofen (ADVIL,MOTRIN) 200 MG tablet Take 400 mg by mouth every 6 (six) hours as needed for headache.       No current facility-administered medications for this visit.     Review of Systems:     Cardiac Review of Systems: Y or N  Chest Pain [   n ]  Resting SOB [  n ] Exertional  SOB  [y  with wheezing  ]  Orthopnea [y]   Pedal Edema [ n  ]    Palpitations [  n] Syncope  [ n ]   Presyncope [ n  ]  General Review of Systems: [Y] = yes [  ]=no Constitional: recent weight change [n  ];  Wt loss over the last 3 months [   ] anorexia [  ]; fatigue [  ]; nausea [  ]; night sweats [  ]; fever [  ]; or chills [  ];          Dental: poor dentition[ n ]; Last Dentist visit: Patient sees a dentist on a regular basis every 6 months without any recent dental problems  Eye : blurred vision [  ]; diplopia [   ]; vision changes [  ];  Amaurosis fugax[  ]; Resp: cough [  ];  wheezing[ y ];  hemoptysis[ n ]; shortness of breath[ n ]; paroxysmal nocturnal dyspnea[n  ]; dyspnea on exertion[y  ]; or orthopnea[ n ];  GI:  gallstones[  ], vomiting[n  ];  dysphagia[  ]; melena[  ];  hematochezia [n  ]; heartburn[  ];   Hx of  Colonoscopy[ y ]; GU: kidney stones [  ]; hematuria[  ];   dysuria [  ];  nocturia[  ];  history of     obstruction [  ]; urinary frequency [  ]             Skin: rash, swelling[  ];, hair loss[  ];  peripheral edema[  ];  or itching[  ]; Musculosketetal: myalgias[  ];  joint swelling[  ];  joint erythema[  ];  joint pain[  ];  back pain[  ];  Heme/Lymph: bruising[  ];  bleeding[  ];  anemia[  ];  Neuro: TIA[ n ];  headaches[ n ];  stroke[  ];  vertigo[  ];  seizures[n  ];   paresthesias[  ];  difficulty walking[n  ];  Psych:depression[  ]; anxiety[  ];  Endocrine: diabetes[  n];  thyroid dysfunction[n  ];  Immunizations: Flu up to date [  ]; Pneumococcal up to date [  ];  Other:  Physical Exam: BP 129/96  Pulse 101  Ht 6' (1.829 m)  Wt 201 lb (91.173 kg)  BMI 27.25 kg/m2  SpO2 96%  PHYSICAL EXAMINATION:  General appearance: alert, cooperative, appears stated age and no distress Neurologic: intact Heart: systolic murmur: holosystolic 3/6, crescendo throughout the precordium Lungs: clear to auscultation bilaterally Abdomen: soft, non-tender; bowel sounds normal; no  masses,  no organomegaly Extremities: extremities normal, atraumatic, no cyanosis or edema and Homans sign is negative, no sign of DVT Patient has no cervical or supraclavicular adenopathy, he has full pedal pulses DP and PT bilaterally, he has no carotid bruits    Diagnostic Studies & Laboratory data:     Recent Radiology Findings:  Nm Pet Image Initial (pi) Skull Base To Thigh  04/09/2014   CLINICAL DATA:  Initial treatment strategy for left lung nodule.  EXAM: NUCLEAR MEDICINE PET SKULL BASE TO THIGH  TECHNIQUE: 9.6 mCi F-18 FDG was injected intravenously. Full-ring PET imaging was performed from the skull base to thigh after the radiotracer. CT data was obtained and used for attenuation correction and anatomic localization.  FASTING BLOOD GLUCOSE:  Value: 97 mg/dl  COMPARISON:  Chest CT on 03/13/2014  FINDINGS: NECK  No hypermetabolic lymph nodes in the neck.  CHEST  No hypermetabolic mediastinal or hilar nodes. 2.1 cm lobulated nodule in the lateral aspect of the left lower lobe remains stable in size and appearance on CT and shows low-grade metabolic activity, with SUV max of 1.7. This is suspicious for low grade adenocarcinoma. No other pulmonary nodules identified on CT.  At ascending thoracic aortic aneurysm is seen measuring 5.6 cm in diameter. Asymmetric muscular activity noted in right shoulder girdle.  ABDOMEN/PELVIS  No abnormal hypermetabolic activity within the liver, pancreas, adrenal glands, or spleen. No hypermetabolic lymph nodes in the abdomen or pelvis. Small calcified gallstones incidentally noted.  SKELETON  No focal hypermetabolic activity to suggest skeletal metastasis.  IMPRESSION: 2.1 cm lobulated nodule in the left lower lobe shows low-grade metabolic activity with SUV max of 1.7, suspicious for low-grade adenocarcinoma.  No evidence of nodal or distant metastatic disease.  5.6 cm ascending thoracic aortic aneurysm and cholelithiasis incidentally noted.   Electronically Signed    By: Earle Gell M.D.   On: 04/09/2014 09:04   Ct Angio Chest W/cm &/or Wo Cm  03/13/2014   CLINICAL DATA:  Thoracic aortic aneurysm  EXAM: CT ANGIOGRAPHY CHEST WITH CONTRAST  TECHNIQUE: Multidetector CT imaging of the chest was performed using the standard protocol during bolus administration of intravenous contrast. Multiplanar CT image reconstructions and MIPs were obtained to evaluate the vascular anatomy.  CONTRAST:  161mL OMNIPAQUE IOHEXOL 350 MG/ML SOLN  COMPARISON:  03/13/2013  FINDINGS: The heart size is normal. There is no pericardial effusion. There is adequate opacification of the thoracic aorta. The ascending aorta at the level of the right main pulmonary artery measures 5.1 cm. The proximal aortic Large measures 5 cm in greatest transverse dimension. The descending thoracic aorta is normal in caliber. There are aortic valvular calcifications. There is no aortic dissection.  The main pulmonary artery, right main pulmonary artery and left main pulmonary arteries are normal in size.  There is no pleural effusion or pneumothorax. There is a new left lower lobe bilobed pulmonary nodule measuring 2.8 x 2.2 cm in greatest dimension.  There is no axillary, hilar, or mediastinal adenopathy.  There is no lytic or blastic osseous lesion. The multiple lower thoracic spine Schmorl's nodes with mild anterior chronic wedge compression deformities of the vertebral bodies. This appearance can be seen in the setting of Scheuermann's disease.  The visualized portions of the upper abdomen are unremarkable.  Review of the MIP images confirms the above findings.  IMPRESSION: 1. New left lower lobe bilobed pulmonary nodule measuring 2.8 x 2.2 cm in greatest dimension. These results will be called to the ordering clinician or representative by the Radiologist Assistant, and communication documented in the PACS or zVision Dashboard. 2. Stable 5.1 cm ascending aortic aneurysm. 3. Aortic valvular calcification.   Electronically  Signed   By: Kathreen Devoid   On: 03/13/2014 09:35    ECHO: 03/2014 Study Conclusions  - Left ventricle: The cavity size was normal. Systolic function was normal. The estimated ejection fraction was in the range of 60% to 65%. Wall motion was normal; there were no regional wall motion abnormalities. Doppler parameters are consistent with abnormal left ventricular relaxation (grade 1 diastolic dysfunction). Doppler parameters are consistent with high ventricular filling pressure. - Aortic valve: A bicuspid morphology cannot be excluded; moderately thickened, severely calcified leaflets. Cusp separation was reduced. There was severe stenosis. Peak velocity (S): 404 cm/s. Mean gradient (S): 42 mm Hg. - Aorta: Aortic root dimension: 38 mm (ED) at the sinus of Valsalva,  4.9cm at sinotubular junction and 5.4cm ascending root). - Mitral valve: There was mild regurgitation. - Left atrium: The atrium was mildly dilated. - Pulmonary arteries: PA peak pressure: 32 mm Hg (S).  Impressions:  - Aortic stenosis is severe (?bicuspid valve). Aortic root enlarged from last evaluation (5.4 from 5.1cm). There is no significant aortic regurgitation  CATH: Procedural Findings:  Hemodynamics  RA 9/8 mean 6 mm Hg  RV 35/5 mm Hg  PA 30/15 Mean 23 mm Hg  PCWP 18/15 mean 13 mm Hg  LV 178/21 mm Hg  AO 116/83 mean 98 mm Hg  AV peak gradient 60 mm Hg, mean gradient 28 mm Hg  AV area 1.3 cm squared with index 0.6.  Oxygen saturations:  PA 74%  AO 93%  Cardiac Output (Fick) 7.1 L/min  Cardiac Index (Fick) 3.37 L/min/meter Squared.  Coronary angiography:  Coronary dominance: left  Left mainstem: Short, normal  Left anterior descending (LAD): Focal 20-30% mid vessel. Otherwise normal.  Left circumflex (LCx): large dominant vessel. Normal.  Right coronary artery (RCA): 50% ostial stenosis. Small nondominant vessel.  Left ventriculography: Not done  Aortic root angiography: Marked enlargement of the  ascending aorta.  Final Conclusions:  1. No significant obstructive CAD  2. Left dominant circulation  3. Normal right heart pressures.  4. Moderate to severe aortic stenosis.  5. Ascending thoracic aneurysm.  Recommendations: Recommend AVR with aortic root grafting.  Peter [name], [place]  05/22/2014, 1:12 PM        Recent Lab Findings: Lab Results  Component Value Date   WBC 6.2 05/20/2014   HGB 15.3 05/20/2014   HCT 45.1 05/20/2014   PLT 180 05/20/2014   GLUCOSE 98 05/20/2014   CHOL 140 02/27/2013   TRIG 121.0 02/27/2013   HDL 34.80* 02/27/2013   LDLCALC 81 02/27/2013   ALT 27 12/02/2011   AST 26 12/02/2011   NA 140 05/20/2014   K 4.4 05/20/2014   CL 103 05/20/2014   CREATININE 1.19 05/20/2014   BUN 18 05/20/2014   CO2 27 05/20/2014   TSH 1.28 12/02/2011   INR 0.90 05/20/2014   Aortic Size Index=      5.2   /Body surface area is 2.15 meters squared. = 2.45  < 2.75 cm/m2      4% risk per year 2.75 to 4.25          8% risk per year > 4.25 cm/m2    20% risk per year  PFT's FEV1 2.54 73 %  DLCO 31.88 94% done July 2015   Assessment / Plan:   #1 new left lower lobe bilobed pulmonary nodule measuring 2.8 x 2.2 cm-by needle/core biopsy  - NONCASEATING GRANULOMATOUS INFLAMMATION. - FIBROBLASTIC FOCI. - THERE IS NO EVIDENCE OF MALIGNANCY. -differential diagnosis includes sarcoidosis  #2 severe aortic stenosis- it is unclear if the patient's worsening asthmatic symptoms are really related to "cardiac asthma" #3 significantly dilated ascending aorta 5.2-5.3 cm #4 adult onset asthma- x30 years - with increasing use of rescue inhaler 6-8 times a day and worsening at night  Evaluation of this patient is let us do recommend proceeding with aortic valve replacement and replacement of ascending aorta possibly requiring hypothermic circulatory arrest. The patient has critical aortic stenosis with velocity across the aortic valve of greater than 4 cm/s a 5.3 cm ascending aorta and symptoms of  increasing shortness of breath. I've discussed with him the pros and cons of mechanical versus tissue aortic valve prosthesis. We have discussed the surgical  risks including death infection stroke myocardial infarction bleeding. He is agreeable with proceeding he prefers a tissue valve. The patient has some family matters that he wishes to resolve before scheduling surgery but would like to proceed in late September early October. After discussing with his family he will call back to set up a time.   Grace Isaac MD      [address] [place] [address] Office [phone]   Beeper [phone]  05/23/2014 5:20 PM

## 2014-05-25 ENCOUNTER — Encounter: Payer: Self-pay | Admitting: Family Medicine

## 2014-05-27 ENCOUNTER — Encounter: Payer: Self-pay | Admitting: Cardiothoracic Surgery

## 2014-05-27 NOTE — Telephone Encounter (Signed)
Please see Mychart message.

## 2014-06-04 ENCOUNTER — Ambulatory Visit (INDEPENDENT_AMBULATORY_CARE_PROVIDER_SITE_OTHER): Payer: Commercial Managed Care - HMO | Admitting: Family Medicine

## 2014-06-04 ENCOUNTER — Encounter: Payer: Self-pay | Admitting: Family Medicine

## 2014-06-04 VITALS — BP 124/86 | HR 78 | Temp 100.1°F | Wt 200.2 lb

## 2014-06-04 DIAGNOSIS — J069 Acute upper respiratory infection, unspecified: Secondary | ICD-10-CM | POA: Insufficient documentation

## 2014-06-04 DIAGNOSIS — R911 Solitary pulmonary nodule: Secondary | ICD-10-CM

## 2014-06-04 LAB — SEDIMENTATION RATE: Sed Rate: 34 mm/hr — ABNORMAL HIGH (ref 0–22)

## 2014-06-04 MED ORDER — AZITHROMYCIN 250 MG PO TABS: ORAL_TABLET | ORAL | Status: DC

## 2014-06-04 NOTE — Patient Instructions (Signed)
Sounds like you have a viral upper respiratory infection. Antibiotics are not needed for this.  Viral infections usually take 7-10 days to resolve.  The cough can last a few weeks to go away. Treat with tylenol and plain mucinex or immediate release guaifenesin. Push fluids and plenty of rest. If not improving with above treatment or any worsening productive cough, fill zpack antibiotic provided today. Please let us know if you are not improving as expected, or if you have high fevers (>101.5) or difficulty swallowing or worsening productive cough. Call clinic with questions.  Good to see you today. I hope you start feeling better.

## 2014-06-04 NOTE — Progress Notes (Signed)
Pre visit review using our clinic review tool, if applicable. No additional management support is needed unless otherwise documented below in the visit note. 

## 2014-06-04 NOTE — Addendum Note (Signed)
Addended by: Marchia Bond on: 06/04/2014 02:53 PM   Modules accepted: Orders

## 2014-06-04 NOTE — Progress Notes (Signed)
BP 124/86  Pulse 78  Temp(Src) 100.1 F (37.8 C) (Oral)  Wt 200 lb 4 oz (90.833 kg)   CC: HA, ST, Fever  Subjective:    Patient ID: Jeremy Meadows, male    DOB: 04-22-1946, 68 y.o.   MRN: [id number]  HPI: Jeremy Meadows is a 68 y.o. male presenting on 06/04/2014 for URI   3d h/o ST, nagging mildly productive cough, chills. Tmax 100.1. + HA (frontal and L parietal).   No PNdrainage, ear or tooth pain, rhinorrhea, congestion, nausea or abd pain, new rashes. Denies wheezing or dyspnea.  No sick contacts at home.  Has treated with plain tylenol. Known h/o asthma/bronchitis.  Pending surgery for severe AS and TAA [name]).  Past Medical History  Diagnosis Date  . Unspecified glaucoma   . Allergic rhinitis, cause unspecified   . Aortic valve stenosis   . HLD (hyperlipidemia)   . Benign neoplasm of colon   . History of nephrolithiasis   . Other testicular hypofunction   . Aortic insufficiency   . Aortic root enlargement     thoracic aorta  . Heart murmur 2014  . Obstructive sleep apnea (adult) (pediatric)     pt. doesn't use his machine at home.  . Pulmonary nodule, left 2015    2cm, ?sarcoid by biopsy     Relevant past medical, surgical, family and social history reviewed and updated as indicated.  Allergies and medications reviewed and updated. Current Outpatient Prescriptions on File Prior to Visit  Medication Sig  . albuterol (VENTOLIN HFA) 108 (90 BASE) MCG/ACT inhaler Inhale 2 puffs into the lungs every 6 (six) hours as needed for wheezing or shortness of breath.  [name] amLODipine (NORVASC) 5 MG tablet Take 5 mg by mouth every evening.  . brinzolamide (AZOPT) 1 % ophthalmic suspension Place 1 drop into both eyes 2 (two) times daily.  . cholecalciferol (VITAMIN D) 1000 UNITS tablet Take 1,000 Units by mouth daily.  . Fluticasone Furoate-Vilanterol (BREO ELLIPTA) 100-25 MCG/INH AEPB Inhale 2 puffs into the lungs daily.  [name] MELATONIN PO Take 6 mg by mouth at  bedtime.  . Multiple Vitamin (MULTIVITAMIN) capsule Take 1 capsule by mouth daily.  . simvastatin (ZOCOR) 20 MG tablet Take 1 tablet (20 mg total) by mouth every evening.  . vitamin C (ASCORBIC ACID) 500 MG tablet Take 500 mg by mouth 2 (two) times daily.   No current facility-administered medications on file prior to visit.    Review of Systems Per HPI unless specifically indicated above    Objective:    BP 124/86  Pulse 78  Temp(Src) 100.1 F (37.8 C) (Oral)  Wt 200 lb 4 oz (90.833 kg)  Physical Exam  Nursing note and vitals reviewed. Constitutional: He appears well-developed and well-nourished. No distress.  HENT:  Head: Normocephalic and atraumatic.  Right Ear: Hearing, tympanic membrane, external ear and ear canal normal.  Left Ear: Hearing, tympanic membrane, external ear and ear canal normal.  Nose: Mucosal edema (L>R) present. No rhinorrhea. Right sinus exhibits no maxillary sinus tenderness and no frontal sinus tenderness. Left sinus exhibits no maxillary sinus tenderness and no frontal sinus tenderness.  Mouth/Throat: Uvula is midline and mucous membranes are normal. Posterior oropharyngeal erythema present. No oropharyngeal exudate, posterior oropharyngeal edema or tonsillar abscesses.  Eyes: Conjunctivae and EOM are normal. Pupils are equal, round, and reactive to light. No scleral icterus.  Neck: Normal range of motion. Neck supple.  Cardiovascular: Normal rate, regular rhythm, normal heart sounds and  intact distal pulses.   No murmur heard. Pulmonary/Chest: Effort normal and breath sounds normal. No respiratory distress. He has no decreased breath sounds. He has no wheezes. He has no rhonchi. He has no rales.  Mildly coarse breath sounds L>R  Lymphadenopathy:    He has no cervical adenopathy.  Skin: Skin is warm and dry. No rash noted.       Assessment & Plan:   Problem List Items Addressed This Visit   Upper respiratory infection, acute - Primary     Flu swab  today - negative Anticipate viral URI - discussed this.  rec treat with continued tylenol, push fluids and rest, and start plain mucinex/guaifenesin if tolerated. WASP for zpack provided in case not improving with above (in setting of upcoming surgery and comorbidities).    Relevant Medications      azithromycin (ZITHROMAX) tablet       Follow up plan: Return if symptoms worsen or fail to improve.

## 2014-06-04 NOTE — Assessment & Plan Note (Addendum)
Flu swab today - negative Anticipate viral URI - discussed this.  rec treat with continued tylenol, push fluids and rest, and start plain mucinex/guaifenesin if tolerated. WASP for zpack provided in case not improving with above (in setting of upcoming surgery and comorbidities).

## 2014-06-05 ENCOUNTER — Ambulatory Visit: Payer: Commercial Managed Care - HMO

## 2014-06-05 LAB — ANCA SCREEN W REFLEX TITER
ATYPICAL P-ANCA SCREEN: NEGATIVE
P-ANCA SCREEN: NEGATIVE
c-ANCA Screen: NEGATIVE

## 2014-06-05 LAB — ANA: ANA: NEGATIVE

## 2014-06-05 LAB — RHEUMATOID FACTOR: Rhuematoid fact SerPl-aCnc: <10 IU/mL (ref <=14)

## 2014-06-06 ENCOUNTER — Other Ambulatory Visit: Payer: Self-pay

## 2014-06-06 DIAGNOSIS — I77810 Thoracic aortic ectasia: Secondary | ICD-10-CM

## 2014-06-06 DIAGNOSIS — R911 Solitary pulmonary nodule: Secondary | ICD-10-CM

## 2014-06-06 DIAGNOSIS — I350 Nonrheumatic aortic (valve) stenosis: Secondary | ICD-10-CM

## 2014-06-06 LAB — QUANTIFERON TB GOLD ASSAY (BLOOD)
INTERFERON GAMMA RELEASE ASSAY: NEGATIVE
Quantiferon Nil Value: 0.47 IU/mL
Quantiferon Tb Ag Minus Nil Value: 0 IU/mL
TB Ag value: 0.47 IU/mL

## 2014-06-14 ENCOUNTER — Ambulatory Visit (INDEPENDENT_AMBULATORY_CARE_PROVIDER_SITE_OTHER): Payer: Commercial Managed Care - HMO | Admitting: Cardiology

## 2014-06-14 ENCOUNTER — Encounter: Payer: Self-pay | Admitting: Internal Medicine

## 2014-06-14 ENCOUNTER — Ambulatory Visit (INDEPENDENT_AMBULATORY_CARE_PROVIDER_SITE_OTHER): Payer: Commercial Managed Care - HMO | Admitting: Internal Medicine

## 2014-06-14 ENCOUNTER — Encounter: Payer: Self-pay | Admitting: Cardiology

## 2014-06-14 ENCOUNTER — Encounter: Payer: Self-pay | Admitting: Cardiothoracic Surgery

## 2014-06-14 ENCOUNTER — Ambulatory Visit: Payer: Commercial Managed Care - HMO | Admitting: Cardiology

## 2014-06-14 VITALS — BP 138/88 | HR 81 | Temp 97.4°F | Wt 199.5 lb

## 2014-06-14 VITALS — BP 134/92 | HR 80 | Ht 72.0 in | Wt 200.1 lb

## 2014-06-14 DIAGNOSIS — I350 Nonrheumatic aortic (valve) stenosis: Secondary | ICD-10-CM

## 2014-06-14 DIAGNOSIS — I712 Thoracic aortic aneurysm, without rupture, unspecified: Secondary | ICD-10-CM

## 2014-06-14 DIAGNOSIS — I10 Essential (primary) hypertension: Secondary | ICD-10-CM

## 2014-06-14 DIAGNOSIS — R911 Solitary pulmonary nodule: Secondary | ICD-10-CM

## 2014-06-14 DIAGNOSIS — I359 Nonrheumatic aortic valve disorder, unspecified: Secondary | ICD-10-CM

## 2014-06-14 DIAGNOSIS — G4733 Obstructive sleep apnea (adult) (pediatric): Secondary | ICD-10-CM

## 2014-06-14 DIAGNOSIS — J069 Acute upper respiratory infection, unspecified: Secondary | ICD-10-CM

## 2014-06-14 NOTE — Progress Notes (Signed)
06/17/2014   PCP: Ria Bush, MD   Chief Complaint  Patient presents with  . Follow-up    pt denies chest pain, sob, and swelling    Primary Cardiologist:Dr. P. [name]   HPI:  68 year old male is followed by Dr. P. [name] for hx of aortic stenosis and thoracic aortic aneurysm. On his last visit we performed an Echo that showed severe aortic stenosis with peak velocity of 4 m/sec and mean gradient of 42 mm Hg. The proximal aorta was aneurysmal with diameter of 5.1 cm. He was also noted to have a LLL pulmonary nodule. PET scan showed low metabolic activity. 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 He was seen by Dr. Lenna Gilford who felt he did not have sarcoidosis. He was treated for acute asthmatic bronchitis in July treated with steroids and inhalers with improvement. He still notes that he gets tired easily and SOB when walking back from his barn.  Dr. P. [name] saw him 05/20/14 and arranged for Rt and Lt cardiac cath to further eval.  Results: Final Conclusions:  1. No significant obstructive CAD  2. Left dominant circulation  3. Normal right heart pressures.  4. Moderate to severe aortic stenosis.  5. Ascending thoracic aneurysm.  Recommendations: Recommend AVR with aortic root grafting  Pt then saw Dr. [name] 05/23/14 -"the patient has critical aortic stenosis with velocity across the aortic valve of greater than 4 cm/s a 5.3 cm ascending aorta and symptoms of increasing shortness of breath."  Plans to proceed with AVR and replacement of ascending aorta.  The patient had some family matters that he wished to resolve before scheduling surgery but would like to proceed in late September early October.  Surgery is scheduled for 07/02/14.    Pt is here today post cath.  He has no new complaints still with fatigue and SOB.  No chest pain.  He tells me he is ready for surgery.    No Known Allergies  Current Outpatient Prescriptions  Medication Sig  Dispense Refill  . albuterol (VENTOLIN HFA) 108 (90 BASE) MCG/ACT inhaler Inhale 2 puffs into the lungs every 6 (six) hours as needed for wheezing or shortness of breath.  1 Inhaler  0  . amLODipine (NORVASC) 5 MG tablet Take 5 mg by mouth every evening.      . brinzolamide (AZOPT) 1 % ophthalmic suspension Place 1 drop into both eyes 2 (two) times daily.      . cholecalciferol (VITAMIN D) 1000 UNITS tablet Take 1,000 Units by mouth daily.      . Fluticasone Furoate-Vilanterol (BREO ELLIPTA) 100-25 MCG/INH AEPB Inhale 2 puffs into the lungs daily.  1 each  11  . MELATONIN PO Take 6 mg by mouth at bedtime.      . Multiple Vitamin (MULTIVITAMIN) capsule Take 1 capsule by mouth daily.      . multivitamin (ONE-A-DAY MEN'S) TABS tablet       . simvastatin (ZOCOR) 20 MG tablet Take 1 tablet (20 mg total) by mouth every evening.  90 tablet  3  . vitamin C (ASCORBIC ACID) 500 MG tablet Take 500 mg by mouth 2 (two) times daily.       No current facility-administered medications for this visit.    Past Medical History  Diagnosis Date  . Unspecified glaucoma   . Allergic rhinitis, cause unspecified   . Aortic valve stenosis   . HLD (hyperlipidemia)   .  Benign neoplasm of colon   . History of nephrolithiasis   . Other testicular hypofunction   . Aortic insufficiency   . Aortic root enlargement     thoracic aorta  . Heart murmur 2014  . Obstructive sleep apnea (adult) (pediatric)     pt. doesn't use his machine at home.  . Pulmonary nodule, left 2015    2cm, ?sarcoid by biopsy    Past Surgical History  Procedure Laterality Date  . Rhinoplasty  1980s  . Knee surgery Right 05/2012    arthroscopic  . Medial partial knee replacement Right 03/2013  . Knee surgery Right 12/2013    patella  . Lithotripsy    . Lung biopsy Left 2015    by IR - benign  . Cataract extraction Bilateral   . Cardiac catheterization  04/2014    no significant obstructive CAD    WPY:KDXIPJA:SN colds or fevers, no  weight changes Skin:no rashes or ulcers HEENT:no blurred vision, no congestion CV:see HPI PUL:see HPI GI:no diarrhea constipation or melena, no indigestion GU:no hematuria, no dysuria MS:no joint pain, no claudication Neuro:no syncope, no lightheadedness Endo:no diabetes, no thyroid disease  Wt Readings from Last 3 Encounters:  06/14/14 199 lb 8 oz (90.493 kg)  06/14/14 200 lb 1.6 oz (90.765 kg)  06/04/14 200 lb 4 oz (90.833 kg)    PHYSICAL EXAM BP 134/92  Pulse 80  Ht 6' (1.829 m)  Wt 200 lb 1.6 oz (90.765 kg)  BMI 27.13 kg/m2 General:Pleasant affect, NAD Skin:Warm and dry, brisk capillary refill HEENT:normocephalic, sclera clear, mucus membranes moist Neck:supple, no JVD, no bruits  Heart:S1S2 RRR with 2/6 systolic murmur rt upper sternal border and 1/6 diastolic murmur at lt sternal border, no gallup, rub or click Lungs:clear without rales, rhonchi, or wheezes KNL:ZJQB, non tender, + BS, do not palpate liver spleen or masses Ext:no lower ext edema, 2+ pedal pulses, 2+ radial pulses Neuro:alert and oriented, MAE, follows commands, + facial symmetry   ASSESSMENT AND PLAN Aortic valve stenosis, severe For AVR 07/02/14, pt continues with SOB and fatigue.  Thoracic aortic aneurysm Plan for replacement of ascending aorta 07/02/14.  No pain currently.  Pulmonary nodule, left Lung, needle/core biopsy(ies), LLL - NONCASEATING GRANULOMATOUS INFLAMMATION. - FIBROBLASTIC FOCI. - THERE IS NO EVIDENCE OF MALIGNANCY.   OBSTRUCTIVE SLEEP APNEA Stable   HTN (hypertension) controlled   We will schedule post surgical appt with Dr. P. [name]

## 2014-06-14 NOTE — Progress Notes (Signed)
HPI  Pt presents to the clinic today with c/o cough and chest congestion. He was seen for the same 06/04/14. He was diagnosed wit hURI and advised to try a few more days of supportive treatment. If his symptoms persist, he was give an zpack and instructed to take it. He reports that he has only had slight improvement in his symptoms. He continues to have a sore throat, cough. The cough is productive of clear/burgandy sputum. He denies fever, chills or body aches. He has taken Mucinex OTC. He has not had sick contacts. He is concerned because he does have an upcoming surgery. He does have a history of seasonal allergies.  Review of Systems      Past Medical History  Diagnosis Date  . Unspecified glaucoma   . Allergic rhinitis, cause unspecified   . Aortic valve stenosis   . HLD (hyperlipidemia)   . Benign neoplasm of colon   . History of nephrolithiasis   . Other testicular hypofunction   . Aortic insufficiency   . Aortic root enlargement     thoracic aorta  . Heart murmur 2014  . Obstructive sleep apnea (adult) (pediatric)     pt. doesn't use his machine at home.  . Pulmonary nodule, left 2015    2cm, ?sarcoid by biopsy    Family History  Problem Relation Age of Onset  . CAD Father 73    MI  . Heart disease Brother   . Heart disease Brother   . Heart disease Brother   . Cancer Neg Hx     History   Social History  . Marital Status: Married    Spouse Name: N/A    Number of Children: 2  . Years of Education: N/A   Occupational History  . manager    Social History Main Topics  . Smoking status: Never Smoker   . Smokeless tobacco: Never Used  . Alcohol Use: No  . Drug Use: No  . Sexual Activity: Not on file   Other Topics Concern  . Not on file   Social History Narrative  . No narrative on file    No Known Allergies   Constitutional: Positive  fatigue Denies headache, fever or abrupt weight changes.  HEENT:  Positive sore throat. Denies eye redness, eye  pain, pressure behind the eyes, facial pain, nasal congestion, ear pain, ringing in the ears, wax buildup, runny nose or bloody nose. Respiratory: Positive cough. Denies difficulty breathing or shortness of breath.  Cardiovascular: Denies chest pain, chest tightness, palpitations or swelling in the hands or feet.   No other specific complaints in a complete review of systems (except as listed in HPI above).  Objective:   BP 138/88  Pulse 81  Temp(Src) 97.4 F (36.3 C) (Oral)  Wt 199 lb 8 oz (90.493 kg)  SpO2 98% Wt Readings from Last 3 Encounters:  06/14/14 199 lb 8 oz (90.493 kg)  06/14/14 200 lb 1.6 oz (90.765 kg)  06/04/14 200 lb 4 oz (90.833 kg)     General: Appears his stated age, well developed, well nourished in NAD. HEENT: Ears: Tm's gray and intact, normal light reflex; Nose: mucosa pink and moist, septum midline; Throat/Mouth: + PND. Teeth present, mucosa erythematous and moist, no exudate noted, no lesions or ulcerations noted.  Cardiovascular: Normal rate and rhythm. S1,S2 noted.  Murmur noted. No rubs or gallops noted.  Pulmonary/Chest: Normal effort and positive vesicular breath sounds. No respiratory distress. No wheezes, rales or ronchi noted.  Assessment & Plan:   Upper Respiratory Infection,resolving:  Get some rest and drink plenty of water Reassured him that the zpack was still in his system- to give it a few more days to see how this plays out 80 mg Depo IM (he feels like he is wheezing although I did not hear anything)- this may also help the sore throat as well  RTC as needed or if symptoms persist.

## 2014-06-14 NOTE — Progress Notes (Signed)
Pre visit review using our clinic review tool, if applicable. No additional management support is needed unless otherwise documented below in the visit note. 

## 2014-06-14 NOTE — Progress Notes (Signed)
Subjective:    Patient ID: Jeremy Meadows, male    DOB: 01-21-1946, 68 y.o.   MRN: [id number]  HPI  Patient presents with continued sore throat, productive cough for the last 12 days.  He visited the office with same sxs 06/04/2014 and was told he had a viral URI and given a zpack script if he did not feel better,  he finished the zpack on 06/12/2014, states he is feeling better than he did prior to taking the abx, but still not well. He is taking mucinex with some relief. He is scheduled for open heart surgery next month and is concerned about being able to get a flu shot prior.  Review of Systems  Past Medical History  Diagnosis Date  . Unspecified glaucoma   . Allergic rhinitis, cause unspecified   . Aortic valve stenosis   . HLD (hyperlipidemia)   . Benign neoplasm of colon   . History of nephrolithiasis   . Other testicular hypofunction   . Aortic insufficiency   . Aortic root enlargement     thoracic aorta  . Heart murmur 2014  . Obstructive sleep apnea (adult) (pediatric)     pt. doesn't use his machine at home.  . Pulmonary nodule, left 2015    2cm, ?sarcoid by biopsy    Current Outpatient Prescriptions  Medication Sig Dispense Refill  . albuterol (VENTOLIN HFA) 108 (90 BASE) MCG/ACT inhaler Inhale 2 puffs into the lungs every 6 (six) hours as needed for wheezing or shortness of breath.  1 Inhaler  0  . amLODipine (NORVASC) 5 MG tablet Take 5 mg by mouth every evening.      . brinzolamide (AZOPT) 1 % ophthalmic suspension Place 1 drop into both eyes 2 (two) times daily.      . cholecalciferol (VITAMIN D) 1000 UNITS tablet Take 1,000 Units by mouth daily.      . Fluticasone Furoate-Vilanterol (BREO ELLIPTA) 100-25 MCG/INH AEPB Inhale 2 puffs into the lungs daily.  1 each  11  . MELATONIN PO Take 6 mg by mouth at bedtime.      . Multiple Vitamin (MULTIVITAMIN) capsule Take 1 capsule by mouth daily.      . multivitamin (ONE-A-DAY MEN'S) TABS tablet       . simvastatin  (ZOCOR) 20 MG tablet Take 1 tablet (20 mg total) by mouth every evening.  90 tablet  3  . vitamin C (ASCORBIC ACID) 500 MG tablet Take 500 mg by mouth 2 (two) times daily.       No current facility-administered medications for this visit.    No Known Allergies  Family History  Problem Relation Age of Onset  . CAD Father 21    MI  . Heart disease Brother   . Heart disease Brother   . Heart disease Brother   . Cancer Neg Hx     History   Social History  . Marital Status: Married    Spouse Name: N/A    Number of Children: 2  . Years of Education: N/A   Occupational History  . manager    Social History Main Topics  . Smoking status: Never Smoker   . Smokeless tobacco: Never Used  . Alcohol Use: No  . Drug Use: No  . Sexual Activity: Not on file   Other Topics Concern  . Not on file   Social History Narrative  . No narrative on file     Constitutional: Denies fever, malaise, fatigue, headache or abrupt  weight changes.  HEENT: Denies eye pain, eye redness, ear pain, ringing in the ears, wax buildup, runny nose, nasal congestion, bloody nose,  Respiratory: Denies difficulty breathing, shortness of breath,duction.   Cardiovascular: Denies chest pain, chest tightness, palpitations or swelling in the hands or feet.    No other specific complaints in a complete review of systems (except as listed in HPI above).     Objective:   Physical Exam   BP 138/88  Pulse 81  Temp(Src) 97.4 F (36.3 C) (Oral)  Wt 199 lb 8 oz (90.493 kg)  SpO2 98% Wt Readings from Last 3 Encounters:  06/14/14 199 lb 8 oz (90.493 kg)  06/14/14 200 lb 1.6 oz (90.765 kg)  06/04/14 200 lb 4 oz (90.833 kg)    General: Appears their stated age, well developed, well nourished in NAD. HEENT: Ears: Tm's gray and intact, normal light reflex; Nose: mucosa pink and moist, septum midline; Throat/Mouth: Teeth present, mucosa pink and moist, no exudate, lesions or ulcerations noted.  Neck: . Neck  supple,no lymphadenopathy noted. Cardiovascular: holosystolic murmur noted. Pulmonary/Chest: Normal effort and positive vesicular breath sounds. No respiratory distress. No wheezes, rales or ronchi noted.   BMET    Component Value Date/Time   NA 140 05/20/2014 0927   K 4.4 05/20/2014 0927   CL 103 05/20/2014 0927   CO2 27 05/20/2014 0927   GLUCOSE 98 05/20/2014 0927   BUN 18 05/20/2014 0927   CREATININE 1.19 05/20/2014 0927   CREATININE 1.1 02/26/2014 0916   CALCIUM 9.9 05/20/2014 0927   GFRNONAA 64.82 08/22/2009 0932   GFRAA 79 08/21/2008 1028    Lipid Panel     Component Value Date/Time   CHOL 140 02/27/2013 1306   TRIG 121.0 02/27/2013 1306   HDL 34.80* 02/27/2013 1306   CHOLHDL 4 02/27/2013 1306   VLDL 24.2 02/27/2013 1306   LDLCALC 81 02/27/2013 1306    CBC    Component Value Date/Time   WBC 6.2 05/20/2014 0927   RBC 4.79 05/20/2014 0927   HGB 15.3 05/20/2014 0927   HCT 45.1 05/20/2014 0927   PLT 180 05/20/2014 0927   MCV 94.2 05/20/2014 0927   MCH 31.9 05/20/2014 0927   MCHC 33.9 05/20/2014 0927   RDW 13.9 05/20/2014 0927   LYMPHSABS 1.6 05/20/2014 0927   MONOABS 0.6 05/20/2014 0927   EOSABS 0.2 05/20/2014 0927   BASOSABS 0.1 05/20/2014 0927    Hgb A1C No results found for this basename: HGBA1C        Assessment & Plan:  1. Acute upper respiratory infections of unspecified site Steroid injection given Please call if your symptoms worsen or fail to improve

## 2014-06-14 NOTE — Patient Instructions (Signed)
Upper Respiratory Infection, Adult An upper respiratory infection (URI) is also sometimes known as the common cold. The upper respiratory tract includes the nose, sinuses, throat, trachea, and bronchi. Bronchi are the airways leading to the lungs. Most people improve within 1 week, but symptoms can last up to 2 weeks. A residual cough may last even longer.  CAUSES Many different viruses can infect the tissues lining the upper respiratory tract. The tissues become irritated and inflamed and often become very moist. Mucus production is also common. A cold is contagious. You can easily spread the virus to others by oral contact. This includes kissing, sharing a glass, coughing, or sneezing. Touching your mouth or nose and then touching a surface, which is then touched by another person, can also spread the virus. SYMPTOMS  Symptoms typically develop 1 to 3 days after you come in contact with a cold virus. Symptoms vary from person to person. They may include:  Runny nose.  Sneezing.  Nasal congestion.  Sinus irritation.  Sore throat.  Loss of voice (laryngitis).  Cough.  Fatigue.  Muscle aches.  Loss of appetite.  Headache.  Low-grade fever. DIAGNOSIS  You might diagnose your own cold based on familiar symptoms, since most people get a cold 2 to 3 times a year. Your caregiver can confirm this based on your exam. Most importantly, your caregiver can check that your symptoms are not due to another disease such as strep throat, sinusitis, pneumonia, asthma, or epiglottitis. Blood tests, throat tests, and X-rays are not necessary to diagnose a common cold, but they may sometimes be helpful in excluding other more serious diseases. Your caregiver will decide if any further tests are required. RISKS AND COMPLICATIONS  You may be at risk for a more severe case of the common cold if you smoke cigarettes, have chronic heart disease (such as heart failure) or lung disease (such as asthma), or if  you have a weakened immune system. The very young and very old are also at risk for more serious infections. Bacterial sinusitis, middle ear infections, and bacterial pneumonia can complicate the common cold. The common cold can worsen asthma and chronic obstructive pulmonary disease (COPD). Sometimes, these complications can require emergency medical care and may be life-threatening. PREVENTION  The best way to protect against getting a cold is to practice good hygiene. Avoid oral or hand contact with people with cold symptoms. Wash your hands often if contact occurs. There is no clear evidence that vitamin C, vitamin E, echinacea, or exercise reduces the chance of developing a cold. However, it is always recommended to get plenty of rest and practice good nutrition. TREATMENT  Treatment is directed at relieving symptoms. There is no cure. Antibiotics are not effective, because the infection is caused by a virus, not by bacteria. Treatment may include:  Increased fluid intake. Sports drinks offer valuable electrolytes, sugars, and fluids.  Breathing heated mist or steam (vaporizer or shower).  Eating chicken soup or other clear broths, and maintaining good nutrition.  Getting plenty of rest.  Using gargles or lozenges for comfort.  Controlling fevers with ibuprofen or acetaminophen as directed by your caregiver.  Increasing usage of your inhaler if you have asthma. Zinc gel and zinc lozenges, taken in the first 24 hours of the common cold, can shorten the duration and lessen the severity of symptoms. Pain medicines may help with fever, muscle aches, and throat pain. A variety of non-prescription medicines are available to treat congestion and runny nose. Your caregiver   can make recommendations and may suggest nasal or lung inhalers for other symptoms.  HOME CARE INSTRUCTIONS   Only take over-the-counter or prescription medicines for pain, discomfort, or fever as directed by your  caregiver.  Use a warm mist humidifier or inhale steam from a shower to increase air moisture. This may keep secretions moist and make it easier to breathe.  Drink enough water and fluids to keep your urine clear or pale yellow.  Rest as needed.  Return to work when your temperature has returned to normal or as your caregiver advises. You may need to stay home longer to avoid infecting others. You can also use a face mask and careful hand washing to prevent spread of the virus. SEEK MEDICAL CARE IF:   After the first few days, you feel you are getting worse rather than better.  You need your caregiver's advice about medicines to control symptoms.  You develop chills, worsening shortness of breath, or brown or red sputum. These may be signs of pneumonia.  You develop yellow or brown nasal discharge or pain in the face, especially when you bend forward. These may be signs of sinusitis.  You develop a fever, swollen neck glands, pain with swallowing, or white areas in the back of your throat. These may be signs of strep throat. SEEK IMMEDIATE MEDICAL CARE IF:   You have a fever.  You develop severe or persistent headache, ear pain, sinus pain, or chest pain.  You develop wheezing, a prolonged cough, cough up blood, or have a change in your usual mucus (if you have chronic lung disease).  You develop sore muscles or a stiff neck. Document Released: 03/09/2001 Document Revised: 12/06/2011 Document Reviewed: 12/19/2013 Exit[employer] Patient Information 2015 [employer]. This information is not intended to replace advice given to you by your health care provider. Make sure you discuss any questions you have with your health care provider.  

## 2014-06-14 NOTE — Patient Instructions (Signed)
Continue same medications   Emmit Alexanders with Surgery 10/6   [facility] appointment with Dr.Jordan Tuesday 07/23/14 at 3:30 pm.

## 2014-06-17 ENCOUNTER — Encounter: Payer: Self-pay | Admitting: Cardiology

## 2014-06-17 NOTE — Assessment & Plan Note (Signed)
Lung, needle/core biopsy(ies), LLL - NONCASEATING GRANULOMATOUS INFLAMMATION. - FIBROBLASTIC FOCI. - THERE IS NO EVIDENCE OF MALIGNANCY.

## 2014-06-17 NOTE — Assessment & Plan Note (Signed)
For AVR 07/02/14, pt continues with SOB and fatigue.

## 2014-06-17 NOTE — Assessment & Plan Note (Signed)
controlled 

## 2014-06-17 NOTE — Assessment & Plan Note (Signed)
Stable

## 2014-06-17 NOTE — Assessment & Plan Note (Signed)
Plan for replacement of ascending aorta 07/02/14.  No pain currently.

## 2014-06-18 ENCOUNTER — Ambulatory Visit (INDEPENDENT_AMBULATORY_CARE_PROVIDER_SITE_OTHER): Payer: Commercial Managed Care - HMO

## 2014-06-18 DIAGNOSIS — Z23 Encounter for immunization: Secondary | ICD-10-CM

## 2014-06-21 ENCOUNTER — Encounter (HOSPITAL_COMMUNITY): Payer: Self-pay

## 2014-06-27 NOTE — Pre-Procedure Instructions (Addendum)
Jeremy Meadows  06/27/2014   Your procedure is scheduled on: Tuesday, July 02, 2014  Report to [facility] Entrance "A" [address] at [facility].  Call this number if you have problems the morning of surgery: [phone]   Remember:   Do not eat food or drink liquids after midnight.   Take these medicines the morning of surgery with A SIP OF WATER: inhalers, eye drops   STOP taking Aspirin, Goody's, BC's, Aleve (Naproxen), Ibuprofen (Advil or Motrin), Fish Oil, Vitamins, Herbal Supplements or any substance that could thin your blood starting today, Friday, October 2.   Do not wear jewelry.  Do not wear lotions, powders, or colognes. You may wear deodorant.  Do not shave 48 hours prior to surgery. Men may shave face and neck.  Do not bring valuables to the hospital.  [facility] is not responsible                  for any belongings or valuables.               Contacts, dentures or bridgework may not be worn into surgery.  Leave suitcase in the car. After surgery it may be brought to your room.  For patients admitted to the hospital, discharge time is determined by your                treatment team.                 Special Instructions   Special Instructions: [facility] - Preparing for Surgery  Before surgery, you can play an important role.  Because skin is not sterile, your skin needs to be as free of germs as possible.  You can reduce the number of germs on you skin by washing with CHG (chlorahexidine gluconate) soap before surgery.  CHG is an antiseptic cleaner which kills germs and bonds with the skin to continue killing germs even after washing.  Please DO NOT use if you have an allergy to CHG or antibacterial soaps.  If your skin becomes reddened/irritated stop using the CHG and inform your nurse when you arrive at Short Stay.  Do not shave (including legs and underarms) for at least 48 hours prior to the first CHG shower.  You may shave your  face.  Please follow these instructions carefully:   1.  Shower with CHG Soap the night before surgery and the morning of Surgery.  2.  If you choose to wash your hair, wash your hair first as usual with your normal shampoo.  3.  After you shampoo, rinse your hair and body thoroughly to remove the Shampoo.  4.  Use CHG as you would any other liquid soap.  You can apply chg directly  to the skin and wash gently with scrungie or a clean washcloth.  5.  Apply the CHG Soap to your body ONLY FROM THE NECK DOWN.  Do not use on open wounds or open sores.  Avoid contact with your eyes ears, mouth and genitals (private parts).  Wash genitals (private parts)       with your normal soap.  6.  Wash thoroughly, paying special attention to the area where your surgery will be performed.  7.  Thoroughly rinse your body with warm water from the neck down.  8.  DO NOT shower/wash with your normal soap after using and rinsing off the CHG Soap.  9.  Pat yourself dry with a  clean towel.            10.  Wear clean pajamas.            11.  Place clean sheets on your bed the night of your first shower and do not sleep with pets.  Day of Surgery  Do not apply any lotions/deodorants the morning of surgery.  Please wear clean clothes to the hospital/surgery center.   Please read over the following fact sheets that you were given: Pain Booklet, Coughing and Deep Breathing, Blood Transfusion Information, Open Heart Packet, MRSA Information and Surgical Site Infection Prevention

## 2014-06-28 ENCOUNTER — Ambulatory Visit (HOSPITAL_COMMUNITY)
Admission: RE | Admit: 2014-06-28 | Discharge: 2014-06-28 | Disposition: A | Discharge status: Home / Self Care | Payer: Medicare HMO | Source: Ambulatory Visit | Attending: Cardiothoracic Surgery | Admitting: Cardiothoracic Surgery

## 2014-06-28 ENCOUNTER — Encounter (HOSPITAL_COMMUNITY): Payer: Commercial Managed Care - HMO

## 2014-06-28 ENCOUNTER — Encounter (HOSPITAL_COMMUNITY): Payer: Self-pay

## 2014-06-28 ENCOUNTER — Encounter (HOSPITAL_COMMUNITY)
Admission: RE | Admit: 2014-06-28 | Discharge: 2014-06-28 | Disposition: A | Discharge status: Home / Self Care | Payer: Medicare HMO | Source: Ambulatory Visit | Attending: Cardiothoracic Surgery | Admitting: Cardiothoracic Surgery

## 2014-06-28 VITALS — BP 130/85 | HR 60 | Temp 98.0°F | Resp 20 | Ht 72.0 in | Wt 201.3 lb

## 2014-06-28 DIAGNOSIS — I77810 Thoracic aortic ectasia: Secondary | ICD-10-CM

## 2014-06-28 DIAGNOSIS — R911 Solitary pulmonary nodule: Secondary | ICD-10-CM

## 2014-06-28 DIAGNOSIS — I352 Nonrheumatic aortic (valve) stenosis with insufficiency: Secondary | ICD-10-CM | POA: Insufficient documentation

## 2014-06-28 DIAGNOSIS — Z01810 Encounter for preprocedural cardiovascular examination: Secondary | ICD-10-CM

## 2014-06-28 DIAGNOSIS — I350 Nonrheumatic aortic (valve) stenosis: Secondary | ICD-10-CM

## 2014-06-28 HISTORY — DX: Personal history of urinary calculi: Z87.442

## 2014-06-28 HISTORY — DX: Unspecified osteoarthritis, unspecified site: M19.90

## 2014-06-28 HISTORY — DX: Essential (primary) hypertension: I10

## 2014-06-28 HISTORY — DX: Unspecified asthma, uncomplicated: J45.909

## 2014-06-28 LAB — URINALYSIS, ROUTINE W REFLEX MICROSCOPIC
Bilirubin Urine: NEGATIVE
Glucose, UA: NEGATIVE mg/dL
Hgb urine dipstick: NEGATIVE
Ketones, ur: NEGATIVE mg/dL
Leukocytes, UA: NEGATIVE
Nitrite: NEGATIVE
Protein, ur: NEGATIVE mg/dL
Specific Gravity, Urine: 1.022 (ref 1.005–1.030)
Urobilinogen, UA: 0.2 mg/dL (ref 0.0–1.0)
pH: 5 (ref 5.0–8.0)

## 2014-06-28 LAB — CBC
HCT: 46.4 % (ref 39.0–52.0)
Hemoglobin: 15.6 g/dL (ref 13.0–17.0)
MCH: 32.1 pg (ref 26.0–34.0)
MCHC: 33.6 g/dL (ref 30.0–36.0)
MCV: 95.5 fL (ref 78.0–100.0)
Platelets: 143 10*3/uL — ABNORMAL LOW (ref 150–400)
RBC: 4.86 MIL/uL (ref 4.22–5.81)
RDW: 13 % (ref 11.5–15.5)
WBC: 9.5 10*3/uL (ref 4.0–10.5)

## 2014-06-28 LAB — ABO/RH: ABO/RH(D): O POS

## 2014-06-28 LAB — COMPREHENSIVE METABOLIC PANEL
ALT: 22 U/L (ref 0–53)
AST: 30 U/L (ref 0–37)
Albumin: 4.1 g/dL (ref 3.5–5.2)
Alkaline Phosphatase: 74 U/L (ref 39–117)
Anion gap: 16 — ABNORMAL HIGH (ref 5–15)
BUN: 21 mg/dL (ref 6–23)
CO2: 19 mEq/L (ref 19–32)
Calcium: 9.4 mg/dL (ref 8.4–10.5)
Chloride: 103 mEq/L (ref 96–112)
Creatinine, Ser: 1.05 mg/dL (ref 0.50–1.35)
GFR calc Af Amer: 82 mL/min — ABNORMAL LOW (ref >90)
GFR calc non Af Amer: 71 mL/min — ABNORMAL LOW (ref >90)
Glucose, Bld: 96 mg/dL (ref 70–99)
Potassium: 4.2 mEq/L (ref 3.7–5.3)
Sodium: 138 mEq/L (ref 137–147)
Total Bilirubin: 0.4 mg/dL (ref 0.3–1.2)
Total Protein: 7.6 g/dL (ref 6.0–8.3)

## 2014-06-28 LAB — BLOOD GAS, ARTERIAL
Acid-base deficit: 0.8 mmol/L (ref 0.0–2.0)
Bicarbonate: 23.4 mEq/L (ref 20.0–24.0)
Drawn by: 42180
O2 Saturation: 97.1 %
Patient temperature: 98.6
TCO2: 24.6 mmol/L (ref 0–100)
pCO2 arterial: 38.7 mmHg (ref 35.0–45.0)
pH, Arterial: 7.399 (ref 7.350–7.450)
pO2, Arterial: 86.5 mmHg (ref 80.0–100.0)

## 2014-06-28 LAB — HEMOGLOBIN A1C
Hgb A1c MFr Bld: 5.7 % — ABNORMAL HIGH (ref <5.7)
Mean Plasma Glucose: 117 mg/dL — ABNORMAL HIGH (ref <117)

## 2014-06-28 LAB — PROTIME-INR
INR: 0.91 (ref 0.00–1.49)
Prothrombin Time: 12.3 seconds (ref 11.6–15.2)

## 2014-06-28 LAB — APTT: aPTT: 30 seconds (ref 24–37)

## 2014-06-28 LAB — SURGICAL PCR SCREEN
MRSA, PCR: NEGATIVE
STAPHYLOCOCCUS AUREUS: POSITIVE — AB

## 2014-06-28 NOTE — Progress Notes (Signed)
Pre-op Cardiac Surgery  Carotid Findings:  Findings suggest 1-39% internal carotid artery stenosis bilaterally. Vertebral arteries are patent with antegrade flow.  Upper Extremity Right Left  Brachial Pressures 129-Triphasic 129-Triphasic  Radial Waveforms Triphasic Triphasic  Ulnar Waveforms Triphasic Triphasic  Palmar Arch (Allen's Test) Signal obliterates with radial compression and is unaffected with ulnar compression. Recent arterial stick.   06/28/2014 12:11 PM Maudry Mayhew, RVT, RDCS, RDMS

## 2014-07-01 MED ORDER — DOPAMINE-DEXTROSE 3.2-5 MG/ML-% IV SOLN
2.0000 ug/kg/min | INTRAVENOUS | Status: DC
  Filled 2014-07-01: qty 250

## 2014-07-01 MED ORDER — CEFUROXIME SODIUM 750 MG IJ SOLR
750.0000 mg | INTRAMUSCULAR | Status: DC
  Filled 2014-07-01: qty 750

## 2014-07-01 MED ORDER — DEXMEDETOMIDINE HCL IN NACL 400 MCG/100ML IV SOLN
0.1000 ug/kg/h | INTRAVENOUS | Status: DC
  Filled 2014-07-01: qty 100

## 2014-07-01 MED ORDER — SODIUM CHLORIDE 0.9 % IV SOLN
INTRAVENOUS | Status: DC
  Filled 2014-07-01: qty 40

## 2014-07-01 MED ORDER — EPINEPHRINE HCL 1 MG/ML IJ SOLN
0.5000 ug/min | INTRAVENOUS | Status: DC
  Filled 2014-07-01: qty 4

## 2014-07-01 MED ORDER — SODIUM CHLORIDE 0.9 % IV SOLN
INTRAVENOUS | Status: DC
  Filled 2014-07-01: qty 2.5

## 2014-07-01 MED ORDER — DEXTROSE 5 % IV SOLN
1.5000 g | INTRAVENOUS | Status: AC
  Administered 2014-07-02: 1.5 g via INTRAVENOUS
  Administered 2014-07-02: .75 g via INTRAVENOUS
  Filled 2014-07-01 (×2): qty 1.5

## 2014-07-01 MED ORDER — VANCOMYCIN HCL 10 G IV SOLR
1250.0000 mg | INTRAVENOUS | Status: AC
  Administered 2014-07-02: 1250 mg via INTRAVENOUS
  Filled 2014-07-01: qty 1250

## 2014-07-01 MED ORDER — SODIUM CHLORIDE 0.9 % IV SOLN
INTRAVENOUS | Status: DC
Start: 2014-07-02 — End: 2014-07-02
  Filled 2014-07-01: qty 30

## 2014-07-01 MED ORDER — PHENYLEPHRINE HCL 10 MG/ML IJ SOLN
30.0000 ug/min | INTRAVENOUS | Status: DC
  Filled 2014-07-01: qty 2

## 2014-07-01 MED ORDER — MAGNESIUM SULFATE 50 % IJ SOLN
40.0000 meq | INTRAMUSCULAR | Status: DC
  Filled 2014-07-01: qty 10

## 2014-07-01 MED ORDER — NITROGLYCERIN IN D5W 200-5 MCG/ML-% IV SOLN
2.0000 ug/min | INTRAVENOUS | Status: DC
  Filled 2014-07-01: qty 250

## 2014-07-01 MED ORDER — PLASMA-LYTE 148 IV SOLN
INTRAVENOUS | Status: AC
  Administered 2014-07-02: 07:00:00
  Filled 2014-07-01: qty 2.5

## 2014-07-01 MED ORDER — POTASSIUM CHLORIDE 2 MEQ/ML IV SOLN
80.0000 meq | INTRAVENOUS | Status: DC
  Filled 2014-07-01: qty 40

## 2014-07-01 NOTE — Progress Notes (Signed)
Anesthesia Chart Review:  Patient is a 68 year old male scheduled for AVR, aortic root replacement, Ascending aortic replacement, possible circulatory arrest, possible left lung wedge resection on 07/02/14 by Dr. [name].  History includes non-smoker, ascending aortic aneurysm, critical AS, hypermetabolic LLL lung nodule (noncaseating granulomatous inflammation, fibroblastic foci, no malignancy 722/15 CT guided biopsy), HLD, HTN, OSA without CPAP compliance, asthma, arthritis, glaucoma, nephrolithiasis, rhinoplasty, right unicompartmental knee replacement. PCP is Dr. Ria Bush. Cardiologist is Dr. [name]. Pulmonologist Dr. Lenna Gilford.  Echo on 03/13/14 showed:  - Left ventricle: The cavity size was normal. Systolic function was normal. The estimated ejection fraction was in the range of 60% to 65%. Wall motion was normal; there were no regional wall motion abnormalities. Doppler parameters are consistent with abnormal left ventricular relaxation (grade 1 diastolic dysfunction). Doppler parameters are consistent with high ventricular filling pressure. - Aortic valve: A bicuspid morphology cannot be excluded; moderately thickened, severely calcified leaflets. Cusp separation was reduced. There was severe stenosis. Peak velocity (S): 404 cm/s. Mean gradient (S): 42 mm Hg. - Aorta: Aortic root dimension: 38 mm (ED) at the sinus of Valsalva, 4.9cm at sinotubular junction and 5.4cm ascending root). - Mitral valve: There was mild regurgitation. - Left atrium: The atrium was mildly dilated. - Pulmonary arteries: PA peak pressure: 32 mm Hg (S). Impressions: Aortic stenosis is severe (?bicuspid valve). Aortic root enlarged from last evaluation (5.4 from 5.1cm). There is no significant aortic regurgitation.  Cardiac cath 05/22/14: Final Conclusions:  1. No significant obstructive CAD (20-30% mid LAD, 50% ostial RCA-small non-dominant vessel, normal LM and CX) 2. Left dominant circulation  3. Normal right heart  pressures.  4. Moderate to severe aortic stenosis.  5. Ascending thoracic aneurysm.  Recommendations: Recommend AVR with aortic root grafting  EKG on 06/28/14 showed: NSR, cannot rule out anterior infarct (age undetermined), LVH, ST/T wave abnormality, consider lateral ischemia.  Carotid duplex on 06/28/14 showed: Findings suggest 1-39% internal carotid artery stenosis bilaterally. Vertebral arteries are patent with antegrade flow.   CXR on 06/28/14 showed: Scarring left base. No nodular opacities identified currently. No consolidation. Anterior wedging of lower thoracic vertebral bodies is stable.  PFT's FVC 3.72 79%, FEV1 2.54 73%, DLCO 31.88 94% done 04/04/14.  Preoperative labs noted.   If no acute changes then I anticipate that he can proceed as planned.  [facility]Anesthesiology Phone [phone] 07/01/2014 10:18 AM

## 2014-07-02 ENCOUNTER — Encounter (HOSPITAL_COMMUNITY): Payer: Self-pay | Admitting: *Deleted

## 2014-07-02 ENCOUNTER — Encounter (HOSPITAL_COMMUNITY): Payer: Medicare HMO | Admitting: Vascular Surgery

## 2014-07-02 ENCOUNTER — Inpatient Hospital Stay (HOSPITAL_COMMUNITY): Payer: Medicare HMO | Admitting: Certified Registered"

## 2014-07-02 ENCOUNTER — Inpatient Hospital Stay (HOSPITAL_COMMUNITY): Payer: Medicare HMO

## 2014-07-02 ENCOUNTER — Inpatient Hospital Stay (HOSPITAL_COMMUNITY)
Admission: RE | Admit: 2014-07-02 | Discharge: 2014-07-08 | DRG: 220 | Disposition: A | Discharge status: Home / Self Care | Payer: Medicare HMO | Source: Ambulatory Visit | Attending: Cardiothoracic Surgery | Admitting: Cardiothoracic Surgery

## 2014-07-02 ENCOUNTER — Encounter (HOSPITAL_COMMUNITY)
Admission: RE | Disposition: A | Discharge status: Home / Self Care | Payer: Commercial Managed Care - HMO | Source: Ambulatory Visit | Attending: Cardiothoracic Surgery

## 2014-07-02 DIAGNOSIS — N320 Bladder-neck obstruction: Secondary | ICD-10-CM | POA: Diagnosis present

## 2014-07-02 DIAGNOSIS — I77810 Thoracic aortic ectasia: Secondary | ICD-10-CM

## 2014-07-02 DIAGNOSIS — R911 Solitary pulmonary nodule: Secondary | ICD-10-CM | POA: Diagnosis present

## 2014-07-02 DIAGNOSIS — I4891 Unspecified atrial fibrillation: Secondary | ICD-10-CM | POA: Diagnosis present

## 2014-07-02 DIAGNOSIS — Z09 Encounter for follow-up examination after completed treatment for conditions other than malignant neoplasm: Secondary | ICD-10-CM

## 2014-07-02 DIAGNOSIS — I7121 Aneurysm of the ascending aorta, without rupture: Secondary | ICD-10-CM | POA: Diagnosis present

## 2014-07-02 DIAGNOSIS — Z96651 Presence of right artificial knee joint: Secondary | ICD-10-CM | POA: Diagnosis present

## 2014-07-02 DIAGNOSIS — Z8249 Family history of ischemic heart disease and other diseases of the circulatory system: Secondary | ICD-10-CM | POA: Diagnosis not present

## 2014-07-02 DIAGNOSIS — D696 Thrombocytopenia, unspecified: Secondary | ICD-10-CM | POA: Diagnosis not present

## 2014-07-02 DIAGNOSIS — J45909 Unspecified asthma, uncomplicated: Secondary | ICD-10-CM | POA: Diagnosis present

## 2014-07-02 DIAGNOSIS — E785 Hyperlipidemia, unspecified: Secondary | ICD-10-CM | POA: Diagnosis present

## 2014-07-02 DIAGNOSIS — D62 Acute posthemorrhagic anemia: Secondary | ICD-10-CM | POA: Diagnosis not present

## 2014-07-02 DIAGNOSIS — I712 Thoracic aortic aneurysm, without rupture: Secondary | ICD-10-CM | POA: Diagnosis present

## 2014-07-02 DIAGNOSIS — G4733 Obstructive sleep apnea (adult) (pediatric): Secondary | ICD-10-CM | POA: Diagnosis present

## 2014-07-02 DIAGNOSIS — I350 Nonrheumatic aortic (valve) stenosis: Principal | ICD-10-CM | POA: Diagnosis present

## 2014-07-02 DIAGNOSIS — J8410 Pulmonary fibrosis, unspecified: Secondary | ICD-10-CM | POA: Diagnosis present

## 2014-07-02 DIAGNOSIS — E8770 Fluid overload, unspecified: Secondary | ICD-10-CM | POA: Diagnosis present

## 2014-07-02 DIAGNOSIS — Z952 Presence of prosthetic heart valve: Secondary | ICD-10-CM

## 2014-07-02 DIAGNOSIS — I10 Essential (primary) hypertension: Secondary | ICD-10-CM | POA: Diagnosis present

## 2014-07-02 DIAGNOSIS — I359 Nonrheumatic aortic valve disorder, unspecified: Secondary | ICD-10-CM

## 2014-07-02 HISTORY — PX: AORTIC VALVE REPLACEMENT: SHX41

## 2014-07-02 HISTORY — PX: ASCENDING AORTIC ROOT REPLACEMENT: SHX5729

## 2014-07-02 HISTORY — PX: INTRAOPERATIVE TRANSESOPHAGEAL ECHOCARDIOGRAM: SHX5062

## 2014-07-02 LAB — CBC
HCT: 31.7 % — ABNORMAL LOW (ref 39.0–52.0)
HEMATOCRIT: 34.5 % — AB (ref 39.0–52.0)
HEMOGLOBIN: 11.7 g/dL — AB (ref 13.0–17.0)
Hemoglobin: 11.3 g/dL — ABNORMAL LOW (ref 13.0–17.0)
MCH: 31.3 pg (ref 26.0–34.0)
MCH: 31.8 pg (ref 26.0–34.0)
MCHC: 33.9 g/dL (ref 30.0–36.0)
MCHC: 35.6 g/dL (ref 30.0–36.0)
MCV: 92.2 fL (ref 78.0–100.0)
MCV: 89.3 fL (ref 78.0–100.0)
Platelets: 113 10*3/uL — ABNORMAL LOW (ref 150–400)
Platelets: 88 10*3/uL — ABNORMAL LOW (ref 150–400)
RBC: 3.74 MIL/uL — ABNORMAL LOW (ref 4.22–5.81)
RBC: 3.55 MIL/uL — ABNORMAL LOW (ref 4.22–5.81)
RDW: 13.2 % (ref 11.5–15.5)
RDW: 13.3 % (ref 11.5–15.5)
WBC: 18.4 10*3/uL — ABNORMAL HIGH (ref 4.0–10.5)
WBC: 11.4 10*3/uL — ABNORMAL HIGH (ref 4.0–10.5)

## 2014-07-02 LAB — POCT I-STAT 3, ART BLOOD GAS (G3+)
Acid-Base Excess: 2 mmol/L (ref 0.0–2.0)
Acid-Base Excess: 2 mmol/L (ref 0.0–2.0)
Acid-base deficit: 3 mmol/L — ABNORMAL HIGH (ref 0.0–2.0)
Acid-base deficit: 6 mmol/L — ABNORMAL HIGH (ref 0.0–2.0)
Acid-base deficit: 4 mmol/L — ABNORMAL HIGH (ref 0.0–2.0)
Bicarbonate: 28 mEq/L — ABNORMAL HIGH (ref 20.0–24.0)
Bicarbonate: 25.4 mEq/L — ABNORMAL HIGH (ref 20.0–24.0)
Bicarbonate: 22 mEq/L (ref 20.0–24.0)
Bicarbonate: 21.2 mEq/L (ref 20.0–24.0)
Bicarbonate: 26.1 mEq/L — ABNORMAL HIGH (ref 20.0–24.0)
Bicarbonate: 21.3 mEq/L (ref 20.0–24.0)
O2 Saturation: 100 %
O2 Saturation: 100 %
O2 Saturation: 100 %
O2 Saturation: 84 %
O2 Saturation: 98 %
O2 Saturation: 96 %
Patient temperature: 34.8
Patient temperature: 35.8
Patient temperature: 36.8
TCO2: 29 mmol/L (ref 0–100)
TCO2: 26 mmol/L (ref 0–100)
TCO2: 23 mmol/L (ref 0–100)
TCO2: 23 mmol/L (ref 0–100)
TCO2: 28 mmol/L (ref 0–100)
TCO2: 22 mmol/L (ref 0–100)
pCO2 arterial: 48.4 mmHg — ABNORMAL HIGH (ref 35.0–45.0)
pCO2 arterial: 35.2 mmHg (ref 35.0–45.0)
pCO2 arterial: 40.2 mmHg (ref 35.0–45.0)
pCO2 arterial: 42.7 mmHg (ref 35.0–45.0)
pCO2 arterial: 47.2 mmHg — ABNORMAL HIGH (ref 35.0–45.0)
pCO2 arterial: 36.6 mmHg (ref 35.0–45.0)
pH, Arterial: 7.37 (ref 7.350–7.450)
pH, Arterial: 7.466 — ABNORMAL HIGH (ref 7.350–7.450)
pH, Arterial: 7.346 — ABNORMAL LOW (ref 7.350–7.450)
pH, Arterial: 7.292 — ABNORMAL LOW (ref 7.350–7.450)
pH, Arterial: 7.345 — ABNORMAL LOW (ref 7.350–7.450)
pH, Arterial: 7.372 (ref 7.350–7.450)
pO2, Arterial: 525 mmHg — ABNORMAL HIGH (ref 80.0–100.0)
pO2, Arterial: 375 mmHg — ABNORMAL HIGH (ref 80.0–100.0)
pO2, Arterial: 209 mmHg — ABNORMAL HIGH (ref 80.0–100.0)
pO2, Arterial: 48 mmHg — ABNORMAL LOW (ref 80.0–100.0)
pO2, Arterial: 114 mmHg — ABNORMAL HIGH (ref 80.0–100.0)
pO2, Arterial: 82 mmHg (ref 80.0–100.0)

## 2014-07-02 LAB — POCT I-STAT, CHEM 8
BUN: 20 mg/dL (ref 6–23)
BUN: 18 mg/dL (ref 6–23)
BUN: 15 mg/dL (ref 6–23)
BUN: 18 mg/dL (ref 6–23)
BUN: 19 mg/dL (ref 6–23)
BUN: 19 mg/dL (ref 6–23)
BUN: 17 mg/dL (ref 6–23)
BUN: 15 mg/dL (ref 6–23)
BUN: 13 mg/dL (ref 6–23)
Calcium, Ion: 1.31 mmol/L — ABNORMAL HIGH (ref 1.13–1.30)
Calcium, Ion: 1.19 mmol/L (ref 1.13–1.30)
Calcium, Ion: 0.97 mmol/L — ABNORMAL LOW (ref 1.13–1.30)
Calcium, Ion: 1.07 mmol/L — ABNORMAL LOW (ref 1.13–1.30)
Calcium, Ion: 0.9 mmol/L — ABNORMAL LOW (ref 1.13–1.30)
Calcium, Ion: 0.92 mmol/L — ABNORMAL LOW (ref 1.13–1.30)
Calcium, Ion: 0.79 mmol/L — ABNORMAL LOW (ref 1.13–1.30)
Calcium, Ion: 0.79 mmol/L — ABNORMAL LOW (ref 1.13–1.30)
Calcium, Ion: 1.03 mmol/L — ABNORMAL LOW (ref 1.13–1.30)
Chloride: 101 mEq/L (ref 96–112)
Chloride: 104 mEq/L (ref 96–112)
Chloride: 100 mEq/L (ref 96–112)
Chloride: 97 mEq/L (ref 96–112)
Chloride: 98 mEq/L (ref 96–112)
Chloride: 99 mEq/L (ref 96–112)
Chloride: 100 mEq/L (ref 96–112)
Chloride: 97 mEq/L (ref 96–112)
Chloride: 105 mEq/L (ref 96–112)
Creatinine, Ser: 0.8 mg/dL (ref 0.50–1.35)
Creatinine, Ser: 1 mg/dL (ref 0.50–1.35)
Creatinine, Ser: 0.7 mg/dL (ref 0.50–1.35)
Creatinine, Ser: 0.8 mg/dL (ref 0.50–1.35)
Creatinine, Ser: 0.7 mg/dL (ref 0.50–1.35)
Creatinine, Ser: 0.9 mg/dL (ref 0.50–1.35)
Creatinine, Ser: 0.7 mg/dL (ref 0.50–1.35)
Creatinine, Ser: 0.7 mg/dL (ref 0.50–1.35)
Creatinine, Ser: 0.9 mg/dL (ref 0.50–1.35)
Glucose, Bld: 106 mg/dL — ABNORMAL HIGH (ref 70–99)
Glucose, Bld: 112 mg/dL — ABNORMAL HIGH (ref 70–99)
Glucose, Bld: 132 mg/dL — ABNORMAL HIGH (ref 70–99)
Glucose, Bld: 118 mg/dL — ABNORMAL HIGH (ref 70–99)
Glucose, Bld: 119 mg/dL — ABNORMAL HIGH (ref 70–99)
Glucose, Bld: 96 mg/dL (ref 70–99)
Glucose, Bld: 196 mg/dL — ABNORMAL HIGH (ref 70–99)
Glucose, Bld: 183 mg/dL — ABNORMAL HIGH (ref 70–99)
Glucose, Bld: 126 mg/dL — ABNORMAL HIGH (ref 70–99)
HCT: 41 % (ref 39.0–52.0)
HCT: 37 % — ABNORMAL LOW (ref 39.0–52.0)
HCT: 30 % — ABNORMAL LOW (ref 39.0–52.0)
HCT: 28 % — ABNORMAL LOW (ref 39.0–52.0)
HCT: 23 % — ABNORMAL LOW (ref 39.0–52.0)
HCT: 25 % — ABNORMAL LOW (ref 39.0–52.0)
HCT: 28 % — ABNORMAL LOW (ref 39.0–52.0)
HCT: 25 % — ABNORMAL LOW (ref 39.0–52.0)
HCT: 30 % — ABNORMAL LOW (ref 39.0–52.0)
Hemoglobin: 13.9 g/dL (ref 13.0–17.0)
Hemoglobin: 12.6 g/dL — ABNORMAL LOW (ref 13.0–17.0)
Hemoglobin: 10.2 g/dL — ABNORMAL LOW (ref 13.0–17.0)
Hemoglobin: 9.5 g/dL — ABNORMAL LOW (ref 13.0–17.0)
Hemoglobin: 7.8 g/dL — ABNORMAL LOW (ref 13.0–17.0)
Hemoglobin: 8.5 g/dL — ABNORMAL LOW (ref 13.0–17.0)
Hemoglobin: 9.5 g/dL — ABNORMAL LOW (ref 13.0–17.0)
Hemoglobin: 8.5 g/dL — ABNORMAL LOW (ref 13.0–17.0)
Hemoglobin: 10.2 g/dL — ABNORMAL LOW (ref 13.0–17.0)
Potassium: 3.9 mEq/L (ref 3.7–5.3)
Potassium: 3.9 mEq/L (ref 3.7–5.3)
Potassium: 3.8 mEq/L (ref 3.7–5.3)
Potassium: 4 mEq/L (ref 3.7–5.3)
Potassium: 4.2 mEq/L (ref 3.7–5.3)
Potassium: 5.2 mEq/L (ref 3.7–5.3)
Potassium: 4.3 mEq/L (ref 3.7–5.3)
Potassium: 3.7 mEq/L (ref 3.7–5.3)
Potassium: 4.5 mEq/L (ref 3.7–5.3)
Sodium: 142 mEq/L (ref 137–147)
Sodium: 139 mEq/L (ref 137–147)
Sodium: 138 mEq/L (ref 137–147)
Sodium: 135 mEq/L — ABNORMAL LOW (ref 137–147)
Sodium: 133 mEq/L — ABNORMAL LOW (ref 137–147)
Sodium: 134 mEq/L — ABNORMAL LOW (ref 137–147)
Sodium: 135 mEq/L — ABNORMAL LOW (ref 137–147)
Sodium: 138 mEq/L (ref 137–147)
Sodium: 139 mEq/L (ref 137–147)
TCO2: 26 mmol/L (ref 0–100)
TCO2: 23 mmol/L (ref 0–100)
TCO2: 22 mmol/L (ref 0–100)
TCO2: 23 mmol/L (ref 0–100)
TCO2: 22 mmol/L (ref 0–100)
TCO2: 23 mmol/L (ref 0–100)
TCO2: 21 mmol/L (ref 0–100)
TCO2: 21 mmol/L (ref 0–100)
TCO2: 20 mmol/L (ref 0–100)

## 2014-07-02 LAB — POCT I-STAT GLUCOSE
Glucose, Bld: 104 mg/dL — ABNORMAL HIGH (ref 70–99)
Operator id: 3408

## 2014-07-02 LAB — APTT: aPTT: 49 seconds — ABNORMAL HIGH (ref 24–37)

## 2014-07-02 LAB — CREATININE, SERUM
Creatinine, Ser: 0.83 mg/dL (ref 0.50–1.35)
GFR calc Af Amer: >90 mL/min (ref >90)
GFR calc non Af Amer: 88 mL/min — ABNORMAL LOW (ref >90)

## 2014-07-02 LAB — HEMOGLOBIN AND HEMATOCRIT, BLOOD
HCT: 27 % — ABNORMAL LOW (ref 39.0–52.0)
Hemoglobin: 9.2 g/dL — ABNORMAL LOW (ref 13.0–17.0)

## 2014-07-02 LAB — POCT I-STAT 4, (NA,K, GLUC, HGB,HCT)
Glucose, Bld: 151 mg/dL — ABNORMAL HIGH (ref 70–99)
HCT: 33 % — ABNORMAL LOW (ref 39.0–52.0)
Hemoglobin: 11.2 g/dL — ABNORMAL LOW (ref 13.0–17.0)
Potassium: 3.4 mEq/L — ABNORMAL LOW (ref 3.7–5.3)
Sodium: 140 mEq/L (ref 137–147)

## 2014-07-02 LAB — PREPARE RBC (CROSSMATCH)

## 2014-07-02 LAB — PROTIME-INR
INR: 2 — AB (ref 0.00–1.49)
Prothrombin Time: 22.7 seconds — ABNORMAL HIGH (ref 11.6–15.2)

## 2014-07-02 LAB — GLUCOSE, CAPILLARY
Glucose-Capillary: 130 mg/dL — ABNORMAL HIGH (ref 70–99)
Glucose-Capillary: 144 mg/dL — ABNORMAL HIGH (ref 70–99)
Glucose-Capillary: 137 mg/dL — ABNORMAL HIGH (ref 70–99)
Glucose-Capillary: 131 mg/dL — ABNORMAL HIGH (ref 70–99)

## 2014-07-02 LAB — MAGNESIUM: Magnesium: 2.7 mg/dL — ABNORMAL HIGH (ref 1.5–2.5)

## 2014-07-02 LAB — PLATELET COUNT: Platelets: 42 10*3/uL — ABNORMAL LOW (ref 150–400)

## 2014-07-02 SURGERY — REPLACEMENT, AORTIC VALVE, OPEN
Anesthesia: General | Site: Chest

## 2014-07-02 MED ORDER — VECURONIUM BROMIDE 10 MG IV SOLR
INTRAVENOUS | Status: DC | PRN
  Administered 2014-07-02: 3 mg via INTRAVENOUS
  Administered 2014-07-02 (×2): 2 mg via INTRAVENOUS
  Administered 2014-07-02: 8 mg via INTRAVENOUS
  Administered 2014-07-02: 2 mg via INTRAVENOUS
  Administered 2014-07-02: 3 mg via INTRAVENOUS

## 2014-07-02 MED ORDER — HEMOSTATIC AGENTS (NO CHARGE) OPTIME
TOPICAL | Status: DC | PRN
  Administered 2014-07-02: 1 via TOPICAL

## 2014-07-02 MED ORDER — MORPHINE SULFATE 2 MG/ML IJ SOLN
2.0000 mg | INTRAMUSCULAR | Status: DC | PRN
  Administered 2014-07-02 (×2): 2 mg via INTRAVENOUS
  Filled 2014-07-02 (×2): qty 1
  Filled 2014-07-02: qty 2

## 2014-07-02 MED ORDER — ALBUTEROL SULFATE HFA 108 (90 BASE) MCG/ACT IN AERS
INHALATION_SPRAY | RESPIRATORY_TRACT | Status: DC | PRN
  Administered 2014-07-02: 4 via RESPIRATORY_TRACT

## 2014-07-02 MED ORDER — METOPROLOL TARTRATE 12.5 MG HALF TABLET
12.5000 mg | ORAL_TABLET | Freq: Two times a day (BID) | ORAL | Status: DC
  Filled 2014-07-02 (×7): qty 1

## 2014-07-02 MED ORDER — VANCOMYCIN HCL 10 G IV SOLR
1250.0000 mg | Freq: Two times a day (BID) | INTRAVENOUS | Status: AC
  Administered 2014-07-02 – 2014-07-04 (×5): 1250 mg via INTRAVENOUS
  Filled 2014-07-02 (×5): qty 1250

## 2014-07-02 MED ORDER — MORPHINE SULFATE 2 MG/ML IJ SOLN
1.0000 mg | INTRAMUSCULAR | Status: DC | PRN
  Administered 2014-07-03: 4 mg via INTRAVENOUS

## 2014-07-02 MED ORDER — ASPIRIN EC 325 MG PO TBEC
325.0000 mg | DELAYED_RELEASE_TABLET | Freq: Every day | ORAL | Status: DC
  Administered 2014-07-04 – 2014-07-08 (×5): 325 mg via ORAL
  Filled 2014-07-02 (×6): qty 1

## 2014-07-02 MED ORDER — PHENYLEPHRINE HCL 10 MG/ML IJ SOLN
10.0000 mg | INTRAVENOUS | Status: DC | PRN
  Administered 2014-07-02: 15 ug/min via INTRAVENOUS

## 2014-07-02 MED ORDER — ONDANSETRON HCL 4 MG/2ML IJ SOLN
4.0000 mg | Freq: Four times a day (QID) | INTRAMUSCULAR | Status: DC | PRN
  Administered 2014-07-03 (×2): 4 mg via INTRAVENOUS
  Filled 2014-07-02 (×2): qty 2

## 2014-07-02 MED ORDER — PHENYLEPHRINE HCL 10 MG/ML IJ SOLN
0.0000 ug/min | INTRAVENOUS | Status: DC
  Administered 2014-07-02: 60 ug/min via INTRAVENOUS
  Administered 2014-07-03: 35 ug/min via INTRAVENOUS
  Administered 2014-07-03 (×2): 50 ug/min via INTRAVENOUS
  Filled 2014-07-02 (×5): qty 2

## 2014-07-02 MED ORDER — BUDESONIDE-FORMOTEROL FUMARATE 160-4.5 MCG/ACT IN AERO
2.0000 | INHALATION_SPRAY | Freq: Two times a day (BID) | RESPIRATORY_TRACT | Status: DC
  Administered 2014-07-03 – 2014-07-08 (×8): 2 via RESPIRATORY_TRACT
  Filled 2014-07-02 (×2): qty 6

## 2014-07-02 MED ORDER — ETOMIDATE 2 MG/ML IV SOLN
INTRAVENOUS | Status: DC | PRN
  Administered 2014-07-02: 20 mg via INTRAVENOUS

## 2014-07-02 MED ORDER — VECURONIUM BROMIDE 10 MG IV SOLR
INTRAVENOUS | Status: AC
  Filled 2014-07-02: qty 10

## 2014-07-02 MED ORDER — MULTIVITAMINS PO CAPS: 1.0000 | ORAL_CAPSULE | Freq: Every day | ORAL | Status: DC

## 2014-07-02 MED ORDER — FAMOTIDINE IN NACL 20-0.9 MG/50ML-% IV SOLN
20.0000 mg | Freq: Two times a day (BID) | INTRAVENOUS | Status: DC
  Administered 2014-07-02: 20 mg via INTRAVENOUS
  Filled 2014-07-02: qty 50

## 2014-07-02 MED ORDER — HEPARIN SODIUM (PORCINE) 1000 UNIT/ML IJ SOLN
INTRAMUSCULAR | Status: AC
  Filled 2014-07-02: qty 1

## 2014-07-02 MED ORDER — BISACODYL 10 MG RE SUPP: 10.0000 mg | Freq: Every day | RECTAL | Status: DC

## 2014-07-02 MED ORDER — LACTATED RINGERS IV SOLN
INTRAVENOUS | Status: DC | PRN
  Administered 2014-07-02 (×2): via INTRAVENOUS

## 2014-07-02 MED ORDER — SUFENTANIL CITRATE 250 MCG/5ML IV SOLN
INTRAVENOUS | Status: AC
  Filled 2014-07-02: qty 5

## 2014-07-02 MED ORDER — PHENYLEPHRINE HCL 10 MG/ML IJ SOLN
INTRAMUSCULAR | Status: AC
  Filled 2014-07-02: qty 1

## 2014-07-02 MED ORDER — ACETAMINOPHEN 160 MG/5ML PO SOLN: 650.0000 mg | Freq: Once | ORAL | Status: AC

## 2014-07-02 MED ORDER — SODIUM BICARBONATE 8.4 % IV SOLN
25.0000 meq | Freq: Once | INTRAVENOUS | Status: AC
  Administered 2014-07-02: 25 meq via INTRAVENOUS

## 2014-07-02 MED ORDER — SODIUM CHLORIDE 0.9 % IV SOLN
Freq: Once | INTRAVENOUS | Status: DC
Start: 2014-07-02 — End: 2014-07-02

## 2014-07-02 MED ORDER — SODIUM CHLORIDE 0.9 % IJ SOLN
3.0000 mL | Freq: Two times a day (BID) | INTRAMUSCULAR | Status: DC
  Administered 2014-07-03 – 2014-07-04 (×4): 3 mL via INTRAVENOUS

## 2014-07-02 MED ORDER — PROPOFOL 10 MG/ML IV BOLUS
INTRAVENOUS | Status: AC
  Filled 2014-07-02: qty 20

## 2014-07-02 MED ORDER — MAGNESIUM SULFATE 4000MG/100ML IJ SOLN
4.0000 g | Freq: Once | INTRAMUSCULAR | Status: AC
  Administered 2014-07-02: 4 g via INTRAVENOUS
  Filled 2014-07-02: qty 100

## 2014-07-02 MED ORDER — ETOMIDATE 2 MG/ML IV SOLN
INTRAVENOUS | Status: AC
  Filled 2014-07-02: qty 10

## 2014-07-02 MED ORDER — DOPAMINE-DEXTROSE 1.6-5 MG/ML-% IV SOLN
INTRAVENOUS | Status: DC | PRN
  Administered 2014-07-02: 3 ug/kg/min via INTRAVENOUS

## 2014-07-02 MED ORDER — OXYCODONE HCL 5 MG PO TABS
5.0000 mg | ORAL_TABLET | ORAL | Status: DC | PRN
  Administered 2014-07-02 (×2): 5 mg via ORAL
  Administered 2014-07-03 (×3): 10 mg via ORAL
  Filled 2014-07-02: qty 1
  Filled 2014-07-02: qty 2
  Filled 2014-07-02: qty 1
  Filled 2014-07-02 (×2): qty 2

## 2014-07-02 MED ORDER — METOPROLOL TARTRATE 1 MG/ML IV SOLN: 2.5000 mg | INTRAVENOUS | Status: DC | PRN

## 2014-07-02 MED ORDER — NEOSTIGMINE METHYLSULFATE 10 MG/10ML IV SOLN
INTRAVENOUS | Status: AC
  Filled 2014-07-02: qty 1

## 2014-07-02 MED ORDER — LACTATED RINGERS IV SOLN
INTRAVENOUS | Status: DC | PRN
  Administered 2014-07-02: 07:00:00 via INTRAVENOUS

## 2014-07-02 MED ORDER — GELATIN ABSORBABLE MT POWD
OROMUCOSAL | Status: DC | PRN
  Administered 2014-07-02 (×3): via TOPICAL

## 2014-07-02 MED ORDER — PROTAMINE SULFATE 10 MG/ML IV SOLN
INTRAVENOUS | Status: DC | PRN
  Administered 2014-07-02: 140 mg via INTRAVENOUS
  Administered 2014-07-02: 130 mg via INTRAVENOUS

## 2014-07-02 MED ORDER — PROPOFOL 10 MG/ML IV BOLUS
INTRAVENOUS | Status: DC | PRN
  Administered 2014-07-02: 50 mg via INTRAVENOUS

## 2014-07-02 MED ORDER — METOPROLOL TARTRATE 25 MG/10 ML ORAL SUSPENSION
12.5000 mg | Freq: Two times a day (BID) | ORAL | Status: DC
  Filled 2014-07-02 (×7): qty 5

## 2014-07-02 MED ORDER — CALCIUM CHLORIDE 10 % IV SOLN
INTRAVENOUS | Status: AC
  Filled 2014-07-02: qty 10

## 2014-07-02 MED ORDER — SODIUM CHLORIDE 0.9 % IV SOLN
1.0000 g/h | INTRAVENOUS | Status: DC
  Filled 2014-07-02: qty 20

## 2014-07-02 MED ORDER — SODIUM CHLORIDE 0.9 % IV SOLN
INTRAVENOUS | Status: DC | PRN
  Administered 2014-07-02 (×2): via INTRAVENOUS

## 2014-07-02 MED ORDER — SODIUM CHLORIDE 0.9 % IV SOLN
INTRAVENOUS | Status: DC
  Administered 2014-07-02: 22:00:00 via INTRAVENOUS
  Filled 2014-07-02 (×2): qty 2.5

## 2014-07-02 MED ORDER — ARTIFICIAL TEARS OP OINT
TOPICAL_OINTMENT | OPHTHALMIC | Status: AC
  Filled 2014-07-02: qty 3.5

## 2014-07-02 MED ORDER — LACTATED RINGERS IV SOLN: 500.0000 mL | Freq: Once | INTRAVENOUS | Status: AC | PRN

## 2014-07-02 MED ORDER — DOPAMINE-DEXTROSE 3.2-5 MG/ML-% IV SOLN
5.0000 ug/kg/min | INTRAVENOUS | Status: DC
  Administered 2014-07-03: 5 ug/kg/min via INTRAVENOUS
  Administered 2014-07-04: 2 ug/kg/min via INTRAVENOUS
  Filled 2014-07-02: qty 250

## 2014-07-02 MED ORDER — CALCIUM CHLORIDE 10 % IV SOLN
INTRAVENOUS | Status: DC | PRN
  Administered 2014-07-02: 1 g via INTRAVENOUS

## 2014-07-02 MED ORDER — MIDAZOLAM HCL 10 MG/2ML IJ SOLN
INTRAMUSCULAR | Status: AC
  Filled 2014-07-02: qty 2

## 2014-07-02 MED ORDER — METOPROLOL TARTRATE 12.5 MG HALF TABLET
12.5000 mg | ORAL_TABLET | Freq: Once | ORAL | Status: AC
  Administered 2014-07-02: 12.5 mg via ORAL
  Filled 2014-07-02: qty 1

## 2014-07-02 MED ORDER — DOCUSATE SODIUM 100 MG PO CAPS
200.0000 mg | ORAL_CAPSULE | Freq: Every day | ORAL | Status: DC
  Administered 2014-07-03 – 2014-07-04 (×2): 200 mg via ORAL
  Filled 2014-07-02 (×2): qty 2

## 2014-07-02 MED ORDER — INSULIN REGULAR BOLUS VIA INFUSION
0.0000 [IU] | Freq: Three times a day (TID) | INTRAVENOUS | Status: DC
  Filled 2014-07-02: qty 10

## 2014-07-02 MED ORDER — NITROGLYCERIN IN D5W 200-5 MCG/ML-% IV SOLN: 0.0000 ug/min | INTRAVENOUS | Status: DC

## 2014-07-02 MED ORDER — HEPARIN SODIUM (PORCINE) 1000 UNIT/ML IJ SOLN
INTRAMUSCULAR | Status: DC | PRN
  Administered 2014-07-02: 27000 [IU] via INTRAVENOUS

## 2014-07-02 MED ORDER — MIDAZOLAM HCL 2 MG/2ML IJ SOLN
2.0000 mg | INTRAMUSCULAR | Status: DC | PRN
Start: 2014-07-02 — End: 2014-07-05

## 2014-07-02 MED ORDER — PROTAMINE SULFATE 10 MG/ML IV SOLN
INTRAVENOUS | Status: AC
  Filled 2014-07-02: qty 25

## 2014-07-02 MED ORDER — BRINZOLAMIDE 1 % OP SUSP
1.0000 [drp] | Freq: Two times a day (BID) | OPHTHALMIC | Status: DC
  Administered 2014-07-02 – 2014-07-08 (×12): 1 [drp] via OPHTHALMIC
  Filled 2014-07-02: qty 10

## 2014-07-02 MED ORDER — NITROGLYCERIN IN D5W 200-5 MCG/ML-% IV SOLN
INTRAVENOUS | Status: DC | PRN
  Administered 2014-07-02: 5 ug/min via INTRAVENOUS

## 2014-07-02 MED ORDER — SIMVASTATIN 20 MG PO TABS
20.0000 mg | ORAL_TABLET | Freq: Every day | ORAL | Status: DC
  Administered 2014-07-03 – 2014-07-06 (×4): 20 mg via ORAL
  Filled 2014-07-02 (×7): qty 1

## 2014-07-02 MED ORDER — ACETAMINOPHEN 650 MG RE SUPP
650.0000 mg | Freq: Once | RECTAL | Status: AC
  Administered 2014-07-02: 650 mg via RECTAL

## 2014-07-02 MED ORDER — SODIUM CHLORIDE 0.9 % IV SOLN: INTRAVENOUS | Status: DC

## 2014-07-02 MED ORDER — ACETAMINOPHEN 500 MG PO TABS
1000.0000 mg | ORAL_TABLET | Freq: Four times a day (QID) | ORAL | Status: DC
  Administered 2014-07-02 – 2014-07-05 (×7): 1000 mg via ORAL
  Filled 2014-07-02 (×13): qty 2

## 2014-07-02 MED ORDER — DEXMEDETOMIDINE HCL IN NACL 200 MCG/50ML IV SOLN
INTRAVENOUS | Status: DC | PRN
  Administered 2014-07-02: 0.2 ug/kg/h via INTRAVENOUS

## 2014-07-02 MED ORDER — TRAMADOL HCL 50 MG PO TABS
50.0000 mg | ORAL_TABLET | ORAL | Status: DC | PRN
  Administered 2014-07-03: 100 mg via ORAL
  Filled 2014-07-02: qty 2

## 2014-07-02 MED ORDER — ARTIFICIAL TEARS OP OINT
TOPICAL_OINTMENT | OPHTHALMIC | Status: DC | PRN
  Administered 2014-07-02: 1 via OPHTHALMIC

## 2014-07-02 MED ORDER — SODIUM CHLORIDE 0.9 % IV SOLN
250.0000 [IU] | INTRAVENOUS | Status: DC | PRN
  Administered 2014-07-02: 1.4 [IU]/h via INTRAVENOUS

## 2014-07-02 MED ORDER — SODIUM CHLORIDE 0.9 % IV SOLN
10.0000 g | INTRAVENOUS | Status: DC | PRN
  Administered 2014-07-02: 5 g/h via INTRAVENOUS

## 2014-07-02 MED ORDER — GLYCOPYRROLATE 0.2 MG/ML IJ SOLN
INTRAMUSCULAR | Status: AC
  Filled 2014-07-02: qty 3

## 2014-07-02 MED ORDER — SUFENTANIL CITRATE 50 MCG/ML IV SOLN
INTRAVENOUS | Status: DC | PRN
  Administered 2014-07-02: 50 ug via INTRAVENOUS
  Administered 2014-07-02: 40 ug via INTRAVENOUS
  Administered 2014-07-02: 10 ug via INTRAVENOUS

## 2014-07-02 MED ORDER — ADULT MULTIVITAMIN W/MINERALS CH
1.0000 | ORAL_TABLET | Freq: Every day | ORAL | Status: DC
  Administered 2014-07-03 – 2014-07-04 (×2): 1 via ORAL
  Filled 2014-07-02 (×4): qty 1

## 2014-07-02 MED ORDER — POTASSIUM CHLORIDE 10 MEQ/50ML IV SOLN
10.0000 meq | INTRAVENOUS | Status: AC
  Administered 2014-07-02 (×3): 10 meq via INTRAVENOUS

## 2014-07-02 MED ORDER — MIDAZOLAM HCL 5 MG/5ML IJ SOLN
INTRAMUSCULAR | Status: DC | PRN
  Administered 2014-07-02 (×2): 3 mg via INTRAVENOUS
  Administered 2014-07-02 (×2): 2 mg via INTRAVENOUS
  Administered 2014-07-02: 3 mg via INTRAVENOUS

## 2014-07-02 MED ORDER — FLUTICASONE FUROATE-VILANTEROL 100-25 MCG/INH IN AEPB: 1.0000 | INHALATION_SPRAY | Freq: Every day | RESPIRATORY_TRACT | Status: DC

## 2014-07-02 MED ORDER — LEVALBUTEROL HCL 0.63 MG/3ML IN NEBU
0.6300 mg | INHALATION_SOLUTION | Freq: Four times a day (QID) | RESPIRATORY_TRACT | Status: DC
  Administered 2014-07-02 – 2014-07-05 (×9): 0.63 mg via RESPIRATORY_TRACT
  Filled 2014-07-02 (×25): qty 3

## 2014-07-02 MED ORDER — PANTOPRAZOLE SODIUM 40 MG PO TBEC
40.0000 mg | DELAYED_RELEASE_TABLET | Freq: Every day | ORAL | Status: DC
  Administered 2014-07-04: 40 mg via ORAL
  Filled 2014-07-02: qty 1

## 2014-07-02 MED ORDER — ASPIRIN 81 MG PO CHEW
324.0000 mg | CHEWABLE_TABLET | Freq: Every day | ORAL | Status: DC
  Administered 2014-07-03: 324 mg
  Filled 2014-07-02: qty 4

## 2014-07-02 MED ORDER — SODIUM CHLORIDE 0.9 % IV SOLN
1000.0000 mg | INTRAVENOUS | Status: AC
  Administered 2014-07-02: 1000 mg via INTRAVENOUS
  Filled 2014-07-02: qty 8

## 2014-07-02 MED ORDER — ALBUMIN HUMAN 5 % IV SOLN
250.0000 mL | INTRAVENOUS | Status: AC | PRN
  Administered 2014-07-02 – 2014-07-03 (×3): 250 mL via INTRAVENOUS
  Filled 2014-07-02: qty 250

## 2014-07-02 MED ORDER — LACTATED RINGERS IV SOLN: INTRAVENOUS | Status: DC

## 2014-07-02 MED ORDER — SODIUM CHLORIDE 0.9 % IV SOLN: 250.0000 mL | INTRAVENOUS | Status: DC

## 2014-07-02 MED ORDER — SODIUM CHLORIDE 0.45 % IV SOLN
INTRAVENOUS | Status: DC
  Administered 2014-07-02: 20 mL/h via INTRAVENOUS

## 2014-07-02 MED ORDER — ALBUMIN HUMAN 5 % IV SOLN
INTRAVENOUS | Status: DC | PRN
  Administered 2014-07-02 (×4): via INTRAVENOUS

## 2014-07-02 MED ORDER — CEFUROXIME SODIUM 1.5 G IJ SOLR
1.5000 g | Freq: Two times a day (BID) | INTRAMUSCULAR | Status: AC
  Administered 2014-07-03 – 2014-07-04 (×4): 1.5 g via INTRAVENOUS
  Filled 2014-07-02 (×4): qty 1.5

## 2014-07-02 MED ORDER — VECURONIUM BROMIDE 10 MG IV SOLR
INTRAVENOUS | Status: AC
  Filled 2014-07-02: qty 20

## 2014-07-02 MED ORDER — ACETAMINOPHEN 160 MG/5ML PO SOLN: 1000.0000 mg | Freq: Four times a day (QID) | ORAL | Status: DC

## 2014-07-02 MED ORDER — DEXMEDETOMIDINE HCL IN NACL 200 MCG/50ML IV SOLN
0.0000 ug/kg/h | INTRAVENOUS | Status: DC
  Administered 2014-07-02 (×2): 0.5 ug/kg/h via INTRAVENOUS
  Filled 2014-07-02 (×2): qty 50

## 2014-07-02 MED ORDER — SODIUM CHLORIDE 0.9 % IJ SOLN: 3.0000 mL | INTRAMUSCULAR | Status: DC | PRN

## 2014-07-02 MED ORDER — BISACODYL 5 MG PO TBEC
10.0000 mg | DELAYED_RELEASE_TABLET | Freq: Every day | ORAL | Status: DC
  Administered 2014-07-03 – 2014-07-04 (×2): 10 mg via ORAL
  Filled 2014-07-02 (×2): qty 2

## 2014-07-02 MED ORDER — HEMOSTATIC AGENTS (NO CHARGE) OPTIME
TOPICAL | Status: DC | PRN
  Administered 2014-07-02 (×2): 1 via TOPICAL

## 2014-07-02 MED ORDER — CHLORHEXIDINE GLUCONATE 4 % EX LIQD
30.0000 mL | CUTANEOUS | Status: DC
  Filled 2014-07-02: qty 30

## 2014-07-02 SURGICAL SUPPLY — 113 items
ADAPTER CARDIO PERF ANTE/RETRO (ADAPTER) ×3 IMPLANT
APPLICATOR COTTON TIP 6IN STRL (MISCELLANEOUS) IMPLANT
ATTRACTOMAT 16X20 MAGNETIC DRP (DRAPES) ×3 IMPLANT
BAG DECANTER FOR FLEXI CONT (MISCELLANEOUS) ×3 IMPLANT
BLADE SCALPEL THERMAL 12 (MISCELLANEOUS) ×3 IMPLANT
BLADE STERNUM SYSTEM 6 (BLADE) ×3 IMPLANT
BLADE SURG 15 STRL LF DISP TIS (BLADE) ×2 IMPLANT
BLADE SURG 15 STRL SS (BLADE) ×1
BOOT SUTURE AID YELLOW STND (SUTURE) ×3 IMPLANT
CANISTER SUCTION 2500CC (MISCELLANEOUS) ×3 IMPLANT
CANN PRFSN .5XCNCT 15X34-48 (MISCELLANEOUS) ×2
CANNULA AORTIC HI-FLOW 6.5M20F (CANNULA) ×3 IMPLANT
CANNULA AORTIC ROOT 9FR (CANNULA) ×3 IMPLANT
CANNULA GUNDRY RCSP 15FR (MISCELLANEOUS) ×3 IMPLANT
CANNULA PRFSN .5XCNCT 15X34-48 (MISCELLANEOUS) ×2 IMPLANT
CANNULA VEN 2 STAGE (MISCELLANEOUS) ×1
CANNULA VENOUS DUAL 32/40 (CANNULA) IMPLANT
CATH CPB KIT GERHARDT (MISCELLANEOUS) ×3 IMPLANT
CATH HEART VENT LEFT (CATHETERS) ×2 IMPLANT
CATH RETROPLEGIA CORONARY 14FR (CATHETERS) ×3 IMPLANT
CATH THORACIC 28FR (CATHETERS) IMPLANT
CATH/SQUID NICHOLS JEHLE COR (CATHETERS) ×3 IMPLANT
CAUTERY SURG HI TEMP FINE TIP (MISCELLANEOUS) ×3 IMPLANT
CONN ST 1/4X3/8  BEN (MISCELLANEOUS) ×1
CONN ST 1/4X3/8 BEN (MISCELLANEOUS) ×2 IMPLANT
CONT SPEC 4OZ CLIKSEAL STRL BL (MISCELLANEOUS) ×3 IMPLANT
CONT SPECI 4OZ STER CLIK (MISCELLANEOUS) ×3 IMPLANT
COVER MAYO STAND STRL (DRAPES) ×3 IMPLANT
COVER SURGICAL LIGHT HANDLE (MISCELLANEOUS) ×3 IMPLANT
CRADLE DONUT ADULT HEAD (MISCELLANEOUS) ×3 IMPLANT
DRAIN CHANNEL 28F RND 3/8 FF (WOUND CARE) ×3 IMPLANT
DRAIN CHANNEL 32F RND 10.7 FF (WOUND CARE) ×3 IMPLANT
DRAPE CARDIOVASCULAR INCISE (DRAPES) ×1
DRAPE SLUSH/WARMER DISC (DRAPES) ×3 IMPLANT
DRAPE SRG 135X102X78XABS (DRAPES) ×2 IMPLANT
DRSG AQUACEL AG ADV 3.5X10 (GAUZE/BANDAGES/DRESSINGS) ×3 IMPLANT
DRSG AQUACEL AG ADV 3.5X14 (GAUZE/BANDAGES/DRESSINGS) ×3 IMPLANT
ELECT BLADE 4.0 EZ CLEAN MEGAD (MISCELLANEOUS) ×3
ELECT CAUTERY BLADE 6.4 (BLADE) ×3 IMPLANT
ELECT REM PT RETURN 9FT ADLT (ELECTROSURGICAL) ×6
ELECTRODE BLDE 4.0 EZ CLN MEGD (MISCELLANEOUS) ×2 IMPLANT
ELECTRODE REM PT RTRN 9FT ADLT (ELECTROSURGICAL) ×4 IMPLANT
FELT TEFLON 6X6 (MISCELLANEOUS) ×3 IMPLANT
GAUZE SPONGE 4X4 12PLY STRL (GAUZE/BANDAGES/DRESSINGS) ×6 IMPLANT
GLOVE BIO SURGEON STRL SZ 6.5 (GLOVE) ×21 IMPLANT
GLOVE BIOGEL PI IND STRL 6 (GLOVE) ×4 IMPLANT
GLOVE BIOGEL PI IND STRL 6.5 (GLOVE) ×2 IMPLANT
GLOVE BIOGEL PI INDICATOR 6 (GLOVE) ×2
GLOVE BIOGEL PI INDICATOR 6.5 (GLOVE) ×1
GOWN STRL REUS W/ TWL LRG LVL3 (GOWN DISPOSABLE) ×8 IMPLANT
GOWN STRL REUS W/TWL LRG LVL3 (GOWN DISPOSABLE) ×4
GRAFT WOVEN D/V 32DX30L (Vascular Products) ×3 IMPLANT
HANDLE STAPLE ENDO GIA SHORT (STAPLE) ×2
HEMOSTAT POWDER SURGIFOAM 1G (HEMOSTASIS) ×9 IMPLANT
HEMOSTAT SURGICEL 2X14 (HEMOSTASIS) ×3 IMPLANT
INSERT FOGARTY XLG (MISCELLANEOUS) IMPLANT
KIT BASIN OR (CUSTOM PROCEDURE TRAY) ×3 IMPLANT
KIT ROOM TURNOVER OR (KITS) ×3 IMPLANT
KIT SUCTION CATH 14FR (SUCTIONS) ×6 IMPLANT
LEAD PACING MYOCARDI (MISCELLANEOUS) ×3 IMPLANT
LINE VENT (MISCELLANEOUS) ×3 IMPLANT
LOOP VESSEL MAXI BLUE (MISCELLANEOUS) ×3 IMPLANT
LOOP VESSEL SUPERMAXI WHITE (MISCELLANEOUS) IMPLANT
NEEDLE 18GX1X1/2 (RX/OR ONLY) (NEEDLE) ×3 IMPLANT
NS IRRIG 1000ML POUR BTL (IV SOLUTION) ×18 IMPLANT
PACK OPEN HEART (CUSTOM PROCEDURE TRAY) ×3 IMPLANT
PAD ARMBOARD 7.5X6 YLW CONV (MISCELLANEOUS) ×6 IMPLANT
PENCIL BUTTON HOLSTER BLD 10FT (ELECTRODE) ×3 IMPLANT
RELOAD EGIA 45 TAN VASC (STAPLE) ×3 IMPLANT
RELOAD GIA II 30 3.5 (ENDOMECHANICALS) ×3 IMPLANT
SEALANT SURG COSEAL 8ML (VASCULAR PRODUCTS) ×3 IMPLANT
SET CARDIOPLEGIA MPS 5001102 (MISCELLANEOUS) ×3 IMPLANT
SPONGE GAUZE 4X4 12PLY STER LF (GAUZE/BANDAGES/DRESSINGS) ×3 IMPLANT
SPONGE LAP 18X18 X RAY DECT (DISPOSABLE) ×15 IMPLANT
SPONGE LAP 4X18 X RAY DECT (DISPOSABLE) ×3 IMPLANT
STAPLER ENDO GIA 12MM SHORT (STAPLE) ×4 IMPLANT
SUCKER INTRACARDIAC WEIGHTED (SUCKER) ×3 IMPLANT
SURGIFLO W/THROMBIN 8M KIT (HEMOSTASIS) ×3 IMPLANT
SUT BONE WAX W31G (SUTURE) IMPLANT
SUT ETHIBON 2 0 V 52N 30 (SUTURE) ×12 IMPLANT
SUT ETHIBOND 2 0 SH (SUTURE) ×1
SUT ETHIBOND 2 0 SH 36X2 (SUTURE) ×2 IMPLANT
SUT ETHIBOND NAB MH 2-0 36IN (SUTURE) ×6 IMPLANT
SUT PROLENE 3 0 RB 1 (SUTURE) ×3 IMPLANT
SUT PROLENE 3 0 SH 1 (SUTURE) ×3 IMPLANT
SUT PROLENE 3 0 SH1 36 (SUTURE) ×6 IMPLANT
SUT PROLENE 4 0 RB 1 (SUTURE) ×16
SUT PROLENE 4-0 RB1 .5 CRCL 36 (SUTURE) ×32 IMPLANT
SUT PROLENE 5 0 C 1 36 (SUTURE) ×9 IMPLANT
SUT PROLENE 6 0 C 1 30 (SUTURE) ×12 IMPLANT
SUT PROLENE 7 0 BV 1 (SUTURE) ×3 IMPLANT
SUT STEEL 6MS V (SUTURE) ×3 IMPLANT
SUT STEEL SZ 6 DBL 3X14 BALL (SUTURE) ×3 IMPLANT
SUT VIC AB 1 CTX 18 (SUTURE) ×6 IMPLANT
SUT VIC AB 2-0 CT1 18 (SUTURE) ×3 IMPLANT
SUT VIC AB 2-0 CTX 27 (SUTURE) ×3 IMPLANT
SUT VIC AB 3-0 X1 27 (SUTURE) ×3 IMPLANT
SUTURE E-PAK OPEN HEART (SUTURE) ×3 IMPLANT
SYSTEM SAHARA CHEST DRAIN ATS (WOUND CARE) ×3 IMPLANT
TAPE CLOTH SURG 4X10 WHT LF (GAUZE/BANDAGES/DRESSINGS) ×3 IMPLANT
TAPE PAPER 2X10 WHT MICROPORE (GAUZE/BANDAGES/DRESSINGS) ×3 IMPLANT
TAPE STRIPS DRAPE STRL (GAUZE/BANDAGES/DRESSINGS) ×6 IMPLANT
TOWEL OR 17X24 6PK STRL BLUE (TOWEL DISPOSABLE) ×6 IMPLANT
TOWEL OR 17X26 10 PK STRL BLUE (TOWEL DISPOSABLE) ×6 IMPLANT
TRAY FOLEY IC TEMP SENS 14FR (CATHETERS) ×3 IMPLANT
TRAY FOLEY IC TEMP SENS 16FR (CATHETERS) ×3 IMPLANT
TUBE FEEDING 8FR 16IN STR KANG (MISCELLANEOUS) ×3 IMPLANT
TUBING BULK SUCTION (MISCELLANEOUS) ×3 IMPLANT
UNDERPAD 30X30 INCONTINENT (UNDERPADS AND DIAPERS) ×3 IMPLANT
VALVE MAGNA EASE AORTIC 23MM (Prosthesis & Implant Heart) ×3 IMPLANT
VENT LEFT HEART 12002 (CATHETERS) ×3
WATER STERILE IRR 1000ML POUR (IV SOLUTION) ×6 IMPLANT
YANKAUER SUCT BULB TIP NO VENT (SUCTIONS) ×3 IMPLANT

## 2014-07-02 NOTE — Anesthesia Preprocedure Evaluation (Addendum)
Anesthesia Evaluation  Patient identified by MRN, date of birth, ID band Patient awake    Reviewed: Allergy & Precautions, H&P , NPO status , Patient's Chart, lab work & pertinent test results  Airway Mallampati: III TM Distance: >3 FB Neck ROM: Full    Dental no notable dental hx. (+) Teeth Intact, Dental Advisory Given   Pulmonary sleep apnea ,  breath sounds clear to auscultation  Pulmonary exam normal       Cardiovascular hypertension, Pt. on medications and Pt. on home beta blockers negative cardio ROS  + Valvular Problems/Murmurs AS Rhythm:Regular Rate:Normal     Neuro/Psych negative neurological ROS  negative psych ROS   GI/Hepatic negative GI ROS, Neg liver ROS,   Endo/Other  negative endocrine ROS  Renal/GU negative Renal ROS  negative genitourinary   Musculoskeletal   Abdominal   Peds  Hematology negative hematology ROS (+)   Anesthesia Other Findings   Reproductive/Obstetrics negative OB ROS                         Anesthesia Physical Anesthesia Plan  ASA: IV  Anesthesia Plan: General   Post-op Pain Management:    Induction: Intravenous  Airway Management Planned: Oral ETT  Additional Equipment: Arterial line, CVP, PA Cath, TEE and Ultrasound Guidance Line Placement  Intra-op Plan:   Post-operative Plan: Post-operative intubation/ventilation  Informed Consent: I have reviewed the patients History and Physical, chart, labs and discussed the procedure including the risks, benefits and alternatives for the proposed anesthesia with the patient or authorized representative who has indicated his/her understanding and acceptance.   Dental advisory given  Plan Discussed with: CRNA, Anesthesiologist and Surgeon  Anesthesia Plan Comments:        Anesthesia Quick Evaluation

## 2014-07-02 NOTE — Progress Notes (Signed)
CT surgery p.m. Rounds  Patient recovering from aVR-ascending aneurysm replacement with hypothermic circulatory arrest. Hemodynamics stable Cardiac index 1.8, chest tube output minimal Patient sedated on 100% FiO2, chest x-ray reviewed and repeat ABG pending for low PO2 following transport from the OR

## 2014-07-02 NOTE — Procedures (Signed)
Extubation Procedure Note  Patient Details:   Name: Jeremy Meadows DOB: Dec 29, 1945 MRN: [id number]   Airway Documentation:     Evaluation  O2 sats: stable throughout Complications: No apparent complications Patient did tolerate procedure well. Bilateral Breath Sounds: Clear;Diminished Suctioning: Airway Yes  Pt extubated to 5L [place]. Able to speak name clearly. VC 1 L and NIF -10 cm/H2O. Great patient effort with NIF but difficulty with mechanics due to ETT. Agreed with RN that all other parameters were met and pt was ready for extubation.   Dulcy Fanny 07/02/2014, 10:37 PM

## 2014-07-02 NOTE — Anesthesia Postprocedure Evaluation (Signed)
  Anesthesia Post-op Note  Patient: Jeremy Meadows  Procedure(s) Performed: Procedure(s): AORTIC VALVE REPLACEMENT (AVR) with 23 Aortic Magna Ease (N/A) supra coronary  ASCENDING AORTIC REPLACEMENT to the Inominate Artery with 5mm Hemashield Platinum with  circulatory arrest (N/A) INTRAOPERATIVE TRANSESOPHAGEAL ECHOCARDIOGRAM (N/A)  Patient Location: SICU  Anesthesia Type:General  Level of Consciousness: sedated, unresponsive and Patient remains intubated per anesthesia plan  Airway and Oxygen Therapy: Patient remains intubated per anesthesia plan and Patient placed on Ventilator (see vital sign flow sheet for setting)  Post-op Pain: none  Post-op Assessment: Post-op Vital signs reviewed, Patient's Cardiovascular Status Stable, Respiratory Function Stable and Patent Airway  Post-op Vital Signs: stable  Last Vitals:  Filed Vitals:   07/02/14 1830  BP:   Pulse: 90  Temp: 36.4 C  Resp: 0    Complications: No apparent anesthesia complications

## 2014-07-02 NOTE — Transfer of Care (Signed)
Immediate Anesthesia Transfer of Care Note  Patient: Jeremy Meadows  Procedure(s) Performed: Procedure(s): AORTIC VALVE REPLACEMENT (AVR) with 23 Aortic Magna Ease (N/A) supra coronary  ASCENDING AORTIC REPLACEMENT to the Inominate Artery with 89mm Hemashield Platinum with  circulatory arrest (N/A) INTRAOPERATIVE TRANSESOPHAGEAL ECHOCARDIOGRAM (N/A)  Patient Location: ICU  Anesthesia Type:General  Level of Consciousness: unresponsive and Patient remains intubated per anesthesia plan  Airway & Oxygen Therapy: Patient remains intubated per anesthesia plan and Patient placed on Ventilator (see vital sign flow sheet for setting)  Post-op Assessment: Report given to PACU RN and Post -op Vital signs reviewed and stable  Post vital signs: Reviewed and stable  Complications: No apparent anesthesia complications

## 2014-07-02 NOTE — Anesthesia Procedure Notes (Addendum)
Procedure Name: Intubation Date/Time: 07/02/2014 7:43 AM Performed by: Melina Copa, Koby Pickup R Pre-anesthesia Checklist: Patient identified, Emergency Drugs available, Suction available, Patient being monitored and Timeout performed Patient Re-evaluated:Patient Re-evaluated prior to inductionOxygen Delivery Method: Circle system utilized Preoxygenation: Pre-oxygenation with 100% oxygen Intubation Type: IV induction Ventilation: Mask ventilation with difficulty Laryngoscope Size: Mac and 4 Grade View: Grade III Tube type: Oral Tube size: 8.5 mm Number of attempts: 1 Airway Equipment and Method: Stylet Placement Confirmation: ETT inserted through vocal cords under direct vision,  positive ETCO2 and breath sounds checked- equal and bilateral Secured at: 21 cm Tube secured with: Tape Dental Injury: Teeth and Oropharynx as per pre-operative assessment

## 2014-07-02 NOTE — H&P (Signed)
[address]       [place] [address]             [phone]                    Jeremy Meadows [place] Medical Record #[id number] Date of Birth: May 09, 1946  Referring: Dr Redmond Pulling Primary Care: Ria Bush, MD  Chief Complaint:    Aortic stenosis  History of Present Illness:    Jeremy Meadows 68 y.o. male is seen in the office  today for followup after CT guided needle biopsy of new bilobular left lower lobe lung lesion. Needle biopsy reveals:  Lung, needle/core biopsy(ies), LLL - NONCASEATING GRANULOMATOUS INFLAMMATION. - FIBROBLASTIC FOCI. - THERE IS NO EVIDENCE OF MALIGNANCY. - SEE COMMENT. Microscopic Comment The differential diagnosis includes sarcoidosis. Malignant features are not identified. Clinical and radiologic correlation is recommended.   Most exactly a year ago the patient was noted by Dr. Lenna Gilford  have a murmur. Evaluation for this included a CT scan of the chest which demonstrated a 5.1 cm dilated ascending aorta, echocardiogram showed severe aortic stenosis with a peak velocity across the aortic valve of 396 mean gradient of 41 and peak gradient of 65. The patient return one year later to see Dr. Peter [name] CT scan of the chest was done to measure the size of his aorta and a new left lower lobe lung nodule was noted. The patient was referred to thoracic surgery for workup of the lung nodule. The patient has had pulmonary function studies done . He does have a history of adult onset asthma , for which he uses bronchodilators intermittently and has for 25 or 30 years. Currently he is on albuterol as a rescue inhaler he does note that his symptoms have been getting worse and is using the inhaler 6 or 8 times a day. In addition he notes that he wakes up at night short of breath and wheezing.   The patient's never been a smoker, retired from [employer], no history of asbestos exposure.  The patient denies syncope denies  angina denies presyncope, he does wake up at night wheezing, he does note shortness of breath with exertion.  He denies diabetes does have hypertension and takes a statin for lipid treatment.   Patient underwent cardiac catheterization .  Current Activity/ Functional Status:  Patient is independent with mobility/ambulation, transfers, ADL's, IADL's.   Zubrod Score: At the time of surgery this patient's most appropriate activity status/level should be described as: [x]     0    Normal activity, no symptoms []     1    Restricted in physical strenuous activity but ambulatory, able to do out light work []     2    Ambulatory and capable of self care, unable to do work activities, up and about               >50 % of waking hours                              []     3    Only limited self care, in bed greater than 50% of waking hours []     4    Completely disabled, no self care, confined to bed or chair []     5    Moribund   Past Medical History  Diagnosis Date  .  Unspecified glaucoma   . Allergic rhinitis, cause unspecified   . Aortic valve stenosis   . HLD (hyperlipidemia)   . Benign neoplasm of colon   . History of nephrolithiasis   . Other testicular hypofunction   . Aortic insufficiency   . Aortic root enlargement     thoracic aorta  . Heart murmur 2014  . Pulmonary nodule, left 2015    2cm, ?sarcoid by biopsy  . Hypertension   . Obstructive sleep apnea (adult) (pediatric)     pt. doesn't use his machine at home.  . Asthma   . History of kidney stones   . Arthritis     Past Surgical History  Procedure Laterality Date  . Rhinoplasty  1980s  . Knee surgery Right 05/2012    arthroscopic  . Medial partial knee replacement Right 03/2013  . Knee surgery Right 12/2013    patella  . Lithotripsy    . Lung biopsy Left 2015    by IR - benign  . Cataract extraction Bilateral   . Cardiac catheterization  04/2014    no significant obstructive CAD  . Joint replacement Right 14     partial replacement    Family History  Problem Relation Age of Onset  . CAD Father 19    MI  . Heart disease Brother   . Heart disease Brother   . Heart disease Brother   . Cancer Neg Hx    patient's family history significant for his father died at age 49 suddenly, told it was a heart attack but no confirmation of this, mother died in her 11s of carcinoma of the jaw Patient has 3 brothers the oldest has had carotid artery disease, no Brother coronary artery bypass grafting, youngest brother has had coronary stents most recently 3 weeks ago. Patient has 2 sons are healthy     History  Smoking status  . Never Smoker   Smokeless tobacco  . Never Used    History  Alcohol Use No     No Known Allergies  Current Facility-Administered Medications  Medication Dose Route Frequency Provider Last Rate Last Dose  . aminocaproic acid (AMICAR) 10 g in sodium chloride 0.9 % 100 mL infusion   Intravenous To OR Grace Isaac, MD      . cefUROXime (ZINACEF) 1.5 g in dextrose 5 % 50 mL IVPB  1.5 g Intravenous To OR Grace Isaac, MD      . cefUROXime (ZINACEF) 750 mg in dextrose 5 % 50 mL IVPB  750 mg Intravenous To OR Grace Isaac, MD      . chlorhexidine (HIBICLENS) 4 % liquid 2 application  30 mL Topical UD Grace Isaac, MD      . dexmedetomidine (PRECEDEX) 400 MCG/100ML (4 mcg/mL) infusion  0.1-0.7 mcg/kg/hr Intravenous To OR Grace Isaac, MD      . DOPamine (INTROPIN) 800 mg in dextrose 5 % 250 mL (3.2 mg/mL) infusion  2-20 mcg/kg/min Intravenous To OR Grace Isaac, MD      . EPINEPHrine (ADRENALIN) 4 mg in dextrose 5 % 250 mL (0.016 mg/mL) infusion  0.5-20 mcg/min Intravenous To OR Grace Isaac, MD      . hemostatic agents    PRN Grace Isaac, MD   1 application at 49/44/96 0715  . heparin 30,000 units/NS 1000 mL solution for CELLSAVER   Other To OR Grace Isaac, MD      . insulin regular (NOVOLIN R,HUMULIN R) 250  Units in sodium chloride 0.9 %  250 mL (1 Units/mL) infusion   Intravenous To OR Grace Isaac, MD      . magnesium sulfate (IV Push/IM) injection 40 mEq  40 mEq Other To OR Grace Isaac, MD      . nitroGLYCERIN 50 mg in dextrose 5 % 250 mL (0.2 mg/mL) infusion  2-200 mcg/min Intravenous To OR Grace Isaac, MD      . phenylephrine (NEO-SYNEPHRINE) 20 mg in dextrose 5 % 250 mL (0.08 mg/mL) infusion  30-200 mcg/min Intravenous To OR Grace Isaac, MD      . potassium chloride injection 80 mEq  80 mEq Other To OR Grace Isaac, MD      . Surgifoam 1 Gm with 0.9% sodium chloride (4 ml) topical solution    PRN Grace Isaac, MD      . vancomycin (VANCOCIN) 1,250 mg in sodium chloride 0.9 % 250 mL IVPB  1,250 mg Intravenous To OR Grace Isaac, MD       Facility-Administered Medications Ordered in Other Encounters  Medication Dose Route Frequency Provider Last Rate Last Dose  . midazolam (VERSED) 5 MG/5ML injection    Anesthesia Intra-op Moshe Salisbury, CRNA   2 mg at 07/02/14 0645  . SUFentanil (SUFENTA) injection    Anesthesia Intra-op Moshe Salisbury, CRNA   10 mcg at 07/02/14 0645     Review of Systems:     Cardiac Review of Systems: Y or N  Chest Pain [   n ]  Resting SOB [  n ] Exertional SOB  [y with wheezing  ]  Orthopnea [y]   Pedal Edema [ n  ]    Palpitations [  n] Syncope  [ n ]   Presyncope [ n  ]  General Review of Systems: [Y] = yes [  ]=no Constitional: recent weight change [n  ];  Wt loss over the last 3 months [   ] anorexia [  ]; fatigue [  ]; nausea [  ]; night sweats [  ]; fever [  ]; or chills [  ];          Dental: poor dentition[ n ]; Last Dentist visit: Patient sees a dentist on a regular basis every 6 months without any recent dental problems  Eye : blurred vision [  ]; diplopia [   ]; vision changes [  ];  Amaurosis fugax[  ]; Resp: cough [  ];  wheezing[ y ];  hemoptysis[ n ]; shortness of breath[ n ]; paroxysmal nocturnal dyspnea[n  ]; dyspnea on exertion[y  ]; or  orthopnea[ n ];  GI:  gallstones[  ], vomiting[n  ];  dysphagia[  ]; melena[  ];  hematochezia [n  ]; heartburn[  ];   Hx of  Colonoscopy[ y ]; GU: kidney stones [  ]; hematuria[  ];   dysuria [  ];  nocturia[  ];  history of     obstruction [  ]; urinary frequency [  ]             Skin: rash, swelling[  ];, hair loss[  ];  peripheral edema[  ];  or itching[  ]; Musculosketetal: myalgias[  ];  joint swelling[  ];  joint erythema[  ];  joint pain[  ];  back pain[  ];  Heme/Lymph: bruising[  ];  bleeding[  ];  anemia[  ];  Neuro: TIA[ n ];  headaches[ n ];  stroke[  ];  vertigo[  ];  seizures[n  ];   paresthesias[  ];  difficulty walking[n  ];  Psych:depression[  ]; anxiety[  ];  Endocrine: diabetes[  n];  thyroid dysfunction[n  ];  Immunizations: Flu up to date [  ]; Pneumococcal up to date [  ];  Other:  Physical Exam: BP 147/95  Pulse 78  Temp(Src) 98.1 F (36.7 C) (Oral)  Resp 18  Ht 6' (1.829 m)  Wt 201 lb (91.173 kg)  BMI 27.25 kg/m2  SpO2 98%  PHYSICAL EXAMINATION:  General appearance: alert, cooperative, appears stated age and no distress Neurologic: intact Heart: systolic murmur: holosystolic 3/6, crescendo throughout the precordium Lungs: clear to auscultation bilaterally Abdomen: soft, non-tender; bowel sounds normal; no masses,  no organomegaly Extremities: extremities normal, atraumatic, no cyanosis or edema and Homans sign is negative, no sign of DVT Patient has no cervical or supraclavicular adenopathy, he has full pedal pulses DP and PT bilaterally, he has no carotid bruits  left chest marked prior to surgery  Diagnostic Studies & Laboratory data:     Recent Radiology Findings:  Nm Pet Image Initial (pi) Skull Base To Thigh  04/09/2014   CLINICAL DATA:  Initial treatment strategy for left lung nodule.  EXAM: NUCLEAR MEDICINE PET SKULL BASE TO THIGH  TECHNIQUE: 9.6 mCi F-18 FDG was injected intravenously. Full-ring PET imaging was performed from the skull base to  thigh after the radiotracer. CT data was obtained and used for attenuation correction and anatomic localization.  FASTING BLOOD GLUCOSE:  Value: 97 mg/dl  COMPARISON:  Chest CT on 03/13/2014  FINDINGS: NECK  No hypermetabolic lymph nodes in the neck.  CHEST  No hypermetabolic mediastinal or hilar nodes. 2.1 cm lobulated nodule in the lateral aspect of the left lower lobe remains stable in size and appearance on CT and shows low-grade metabolic activity, with SUV max of 1.7. This is suspicious for low grade adenocarcinoma. No other pulmonary nodules identified on CT.  At ascending thoracic aortic aneurysm is seen measuring 5.6 cm in diameter. Asymmetric muscular activity noted in right shoulder girdle.  ABDOMEN/PELVIS  No abnormal hypermetabolic activity within the liver, pancreas, adrenal glands, or spleen. No hypermetabolic lymph nodes in the abdomen or pelvis. Small calcified gallstones incidentally noted.  SKELETON  No focal hypermetabolic activity to suggest skeletal metastasis.  IMPRESSION: 2.1 cm lobulated nodule in the left lower lobe shows low-grade metabolic activity with SUV max of 1.7, suspicious for low-grade adenocarcinoma.  No evidence of nodal or distant metastatic disease.  5.6 cm ascending thoracic aortic aneurysm and cholelithiasis incidentally noted.   Electronically Signed   By: Earle Gell M.D.   On: 04/09/2014 09:04   Ct Angio Chest W/cm &/or Wo Cm  03/13/2014   CLINICAL DATA:  Thoracic aortic aneurysm  EXAM: CT ANGIOGRAPHY CHEST WITH CONTRAST  TECHNIQUE: Multidetector CT imaging of the chest was performed using the standard protocol during bolus administration of intravenous contrast. Multiplanar CT image reconstructions and MIPs were obtained to evaluate the vascular anatomy.  CONTRAST:  130mL OMNIPAQUE IOHEXOL 350 MG/ML SOLN  COMPARISON:  03/13/2013  FINDINGS: The heart size is normal. There is no pericardial effusion. There is adequate opacification of the thoracic aorta. The ascending  aorta at the level of the right main pulmonary artery measures 5.1 cm. The proximal aortic Large measures 5 cm in greatest transverse dimension. The descending thoracic aorta is normal in caliber. There are aortic valvular calcifications. There is no aortic dissection.  The  main pulmonary artery, right main pulmonary artery and left main pulmonary arteries are normal in size.  There is no pleural effusion or pneumothorax. There is a new left lower lobe bilobed pulmonary nodule measuring 2.8 x 2.2 cm in greatest dimension.  There is no axillary, hilar, or mediastinal adenopathy.  There is no lytic or blastic osseous lesion. The multiple lower thoracic spine Schmorl's nodes with mild anterior chronic wedge compression deformities of the vertebral bodies. This appearance can be seen in the setting of Scheuermann's disease.  The visualized portions of the upper abdomen are unremarkable.  Review of the MIP images confirms the above findings.  IMPRESSION: 1. New left lower lobe bilobed pulmonary nodule measuring 2.8 x 2.2 cm in greatest dimension. These results will be called to the ordering clinician or representative by the Radiologist Assistant, and communication documented in the PACS or zVision Dashboard. 2. Stable 5.1 cm ascending aortic aneurysm. 3. Aortic valvular calcification.   Electronically Signed   By: Kathreen Devoid   On: 03/13/2014 09:35    ECHO: 03/2014 Study Conclusions  - Left ventricle: The cavity size was normal. Systolic function was normal. The estimated ejection fraction was in the range of 60% to 65%. Wall motion was normal; there were no regional wall motion abnormalities. Doppler parameters are consistent with abnormal left ventricular relaxation (grade 1 diastolic dysfunction). Doppler parameters are consistent with high ventricular filling pressure. - Aortic valve: A bicuspid morphology cannot be excluded; moderately thickened, severely calcified leaflets. Cusp separation was  reduced. There was severe stenosis. Peak velocity (S): 404 cm/s. Mean gradient (S): 42 mm Hg. - Aorta: Aortic root dimension: 38 mm (ED) at the sinus of Valsalva, 4.9cm at sinotubular junction and 5.4cm ascending root). - Mitral valve: There was mild regurgitation. - Left atrium: The atrium was mildly dilated. - Pulmonary arteries: PA peak pressure: 32 mm Hg (S).  Impressions:  - Aortic stenosis is severe (?bicuspid valve). Aortic root enlarged from last evaluation (5.4 from 5.1cm). There is no significant aortic regurgitation  CATH: Procedural Findings:  Hemodynamics  RA 9/8 mean 6 mm Hg  RV 35/5 mm Hg  PA 30/15 Mean 23 mm Hg  PCWP 18/15 mean 13 mm Hg  LV 178/21 mm Hg  AO 116/83 mean 98 mm Hg  AV peak gradient 60 mm Hg, mean gradient 28 mm Hg  AV area 1.3 cm squared with index 0.6.  Oxygen saturations:  PA 74%  AO 93%  Cardiac Output (Fick) 7.1 L/min  Cardiac Index (Fick) 3.37 L/min/meter Squared.  Coronary angiography:  Coronary dominance: left  Left mainstem: Short, normal  Left anterior descending (LAD): Focal 20-30% mid vessel. Otherwise normal.  Left circumflex (LCx): large dominant vessel. Normal.  Right coronary artery (RCA): 50% ostial stenosis. Small nondominant vessel.  Left ventriculography: Not done  Aortic root angiography: Marked enlargement of the ascending aorta.  Final Conclusions:  1. No significant obstructive CAD  2. Left dominant circulation  3. Normal right heart pressures.  4. Moderate to severe aortic stenosis.  5. Ascending thoracic aneurysm.  Recommendations: Recommend AVR with aortic root grafting.  Peter [name], [place]  05/22/2014, 1:12 PM        Recent Lab Findings: Lab Results  Component Value Date   WBC 9.5 06/28/2014   HGB 15.6 06/28/2014   HCT 46.4 06/28/2014   PLT 143* 06/28/2014   GLUCOSE 96 06/28/2014   CHOL 140 02/27/2013   TRIG 121.0 02/27/2013   HDL 34.80* 02/27/2013   LDLCALC 81 02/27/2013  ALT 22 06/28/2014   AST 30  06/28/2014   NA 138 06/28/2014   K 4.2 06/28/2014   CL 103 06/28/2014   CREATININE 1.05 06/28/2014   BUN 21 06/28/2014   CO2 19 06/28/2014   TSH 1.28 12/02/2011   INR 0.91 06/28/2014   HGBA1C 5.7* 06/28/2014   Aortic Size Index=      5.2   /Body surface area is 2.15 meters squared. = 2.45  < 2.75 cm/m2      4% risk per year 2.75 to 4.25          8% risk per year > 4.25 cm/m2    20% risk per year  PFT's FEV1 2.54 73 %  DLCO 31.88 94% done July 2015   Assessment / Plan:   #1 new left lower lobe bilobed pulmonary nodule measuring 2.8 x 2.2 cm-by needle/core biopsy  - NONCASEATING GRANULOMATOUS INFLAMMATION. - FIBROBLASTIC FOCI. - THERE IS NO EVIDENCE OF MALIGNANCY. -differential diagnosis includes sarcoidosis  #2 severe aortic stenosis- it is unclear if the patient's worsening asthmatic symptoms are really related to "cardiac asthma" #3 significantly dilated ascending aorta 5.2-5.3 cm #4 adult onset asthma- x30 years - with increasing use of rescue inhaler 6-8 times a day and worsening at night  Evaluation of this patient is let us do recommend proceeding with aortic valve replacement and replacement of ascending aorta possibly requiring hypothermic circulatory arrest. The patient has critical aortic stenosis with velocity across the aortic valve of greater than 4 cm/s a 5.3 cm ascending aorta and symptoms of increasing shortness of breath. I've discussed with him the pros and cons of mechanical versus tissue aortic valve prosthesis. He is agreeable with proceeding he prefers a tissue valve.   The goals risks and alternatives of the planned surgical procedure aortic valve replacement and replacement of ascending aorta possibly requiring hypothermic circulatory arrest poss wedge resection of granuloma left lung  have been discussed with the patient in detail. The risks of the procedure including death, infection, stroke, myocardial infarction, bleeding, blood transfusion have all been discussed  specifically.  I have quoted Sullivan Lone a 5 % of perioperative mortality and a complication rate as high as 25 %. The patient's questions have been answered.BRECKON REEVES is willing  to proceed with the planned procedure.  Grace Isaac MD      [address] [place] [address] Office [phone]   Beeper [phone]  07/02/2014 7:24 AM

## 2014-07-02 NOTE — Progress Notes (Signed)
  Echocardiogram Echocardiogram Transesophageal has been performed.  Donata Clay 07/02/2014, 10:28 AM

## 2014-07-02 NOTE — Brief Op Note (Addendum)
      [address]       [place] [address]             [phone]   l10/02/2014  3:56 PM  PATIENT:  Jeremy Meadows  68 y.o. male  PRE-OPERATIVE DIAGNOSIS:  1. Critical aortic stenosis 2. Ascending thoracic aorta aneurysm 3. Left lower lobe pulmonary nodule  POST-OPERATIVE DIAGNOSIS:  1. Critical aortic stenosis 2. Ascending thoracic aorta aneurysm 3. Left lower lobe pulmonary nodule   PROCEDURE:  INTRAOPERATIVE TRANSESOPHAGEAL ECHOCARDIOGRAM, AORTIC VALVE REPLACEMENT (using a Magna Ease Pericardial Tissue Valve size 23 mm), Circulatory arrest, ASCENDING AORTIC ROOT REPLACEMENT (using a Hema shield Graft, diameter 32 mm)   SURGEON:  Surgeon(s) and Role:    * Grace Isaac, MD - Primary  PHYSICIAN ASSISTANT: Lars Pinks PA-C  ASSISTANTS: Jene Every RNFA  ANESTHESIA:   general  EBL:  Total I/O In: 2750 [I.V.:1500; Blood:750; IV Piggyback:500] Out: 87 [Urine:1100]   DRAINS: Chest tubes in the mediastinal and pleural spaces    SPECIMEN:  Source of Specimen:  Native aortic valve leaflets,Ascending thoracic aorta aneursym  DISPOSITION OF SPECIMEN:  PATHOLOGY  COUNTS CORRECT:  YES  DICTATION: .Dragon Dictation  PLAN OF CARE: Admit to inpatient   PATIENT DISPOSITION:  ICU - intubated and hemodynamically stable.   Delay start of Pharmacological VTE agent (>24hrs) due to surgical blood loss or risk of bleeding: yes  BASELINE WEIGHT: 91 kg  Aortic Valve Etiology   Aortic Insufficiency:  Trivial/Trace  Aortic Valve Disease:  Yes.  Aortic Stenosis:  Yes. Smallest Aortic Valve Area: 0.78 cm2; Highest Mean Gradient: 21mmHg.  Etiology (Choose at least one and up to  5 etiologies):  Degenerative - Calcified  Aortic Valve  Procedure Performed:  Replacement: Yes.  Bioprosthetic Valve. Implant Model Number:[id number], Size:23 mm, Unique Device Identifier:[id number]  Repair/Reconstruction: Yes.  Replacement AV & insertion aortic non-valved  conduit Bioprosthetic Valve. Implant Model Number:[id number], Size:23 mm, Unique Device Identifier:[id number]  Aortic Annular Enlargement: No annular enlargementes.

## 2014-07-02 NOTE — Progress Notes (Signed)
ANTIBIOTIC CONSULT NOTE - INITIAL  Pharmacy Consult for Vancomycin  Indication: post op AVR  No Known Allergies  Patient Measurements: Height: 6' (182.9 cm) Weight: 201 lb (91.173 kg) IBW/kg (Calculated) : 77.6 Adjusted Body Weight:   Vital Signs: Temp: 98.1 F (36.7 C) (10/06 2300) Temp Source: Core (Comment) (10/06 2200) BP: 95/63 mmHg (10/06 2300) Pulse Rate: 90 (10/06 2300) Intake/Output from previous day:   Intake/Output from this shift: Total I/O In: 1069.1 [I.V.:469.1; IV Piggyback:600] Out: 900 [Urine:740; Chest Tube:160]  Labs:  Recent Labs  07/02/14 1312  07/02/14 1459 07/02/14 1630 07/02/14 1635 07/02/14 2216 07/02/14 2233  WBC  --   --   --  18.4*  --  11.4*  --   HGB 9.2*  < > 8.5* 11.7* 11.2* 11.3* 10.2*  PLT 42*  --   --  113*  --  88*  --   CREATININE  --   < > 0.70  --   --  0.83 0.90  < > = values in this interval not displayed. Estimated Creatinine Clearance: 86.2 ml/min (by C-G formula based on Cr of 0.9). No results found for this basename: VANCOTROUGH, Corlis Leak, VANCORANDOM, GENTTROUGH, GENTPEAK, GENTRANDOM, TOBRATROUGH, TOBRAPEAK, TOBRARND, AMIKACINPEAK, AMIKACINTROU, AMIKACIN,  in the last 72 hours   Microbiology: Recent Results (from the past 720 hour(s))  SURGICAL PCR SCREEN     Status: Abnormal   Collection Time    06/28/14  9:47 AM      Result Value Ref Range Status   MRSA, PCR NEGATIVE  NEGATIVE Final   Staphylococcus aureus POSITIVE (*) NEGATIVE Final   Comment:            The Xpert SA Assay (FDA     approved for NASAL specimens     in patients over 50 years of age),     is one component of     a comprehensive surveillance     program.  Test performance has     been validated by [employer] for patients greater     than or equal to 41 year old.     It is not intended     to diagnose infection nor to     guide or monitor treatment.    Medical History: Past Medical History  Diagnosis Date  . Unspecified glaucoma    . Allergic rhinitis, cause unspecified   . Aortic valve stenosis   . HLD (hyperlipidemia)   . Benign neoplasm of colon   . History of nephrolithiasis   . Other testicular hypofunction   . Aortic insufficiency   . Aortic root enlargement     thoracic aorta  . Heart murmur 2014  . Pulmonary nodule, left 2015    2cm, ?sarcoid by biopsy  . Hypertension   . Obstructive sleep apnea (adult) (pediatric)     pt. doesn't use his machine at home.  . Asthma   . History of kidney stones   . Arthritis     Medications:  Scheduled:  . [START ON 07/03/2014] acetaminophen  1,000 mg Oral 4 times per day   Or  . [START ON 07/03/2014] acetaminophen (TYLENOL) oral liquid 160 mg/5 mL  1,000 mg Per Tube 4 times per day  . [START ON 07/03/2014] aspirin EC  325 mg Oral Daily   Or  . [START ON 07/03/2014] aspirin  324 mg Per Tube Daily  . [START ON 07/03/2014] bisacodyl  10 mg Oral Daily   Or  . [  START ON 07/03/2014] bisacodyl  10 mg Rectal Daily  . brinzolamide  1 drop Both Eyes BID  . [START ON 07/03/2014] budesonide-formoterol  2 puff Inhalation BID  . [START ON 07/03/2014] cefUROXime (ZINACEF)  IV  1.5 g Intravenous Q12H  . [START ON 07/03/2014] docusate sodium  200 mg Oral Daily  . famotidine (PEPCID) IV  20 mg Intravenous Q12H  . insulin regular  0-10 Units Intravenous TID WC  . levalbuterol  0.63 mg Nebulization Q6H  . metoprolol tartrate  12.5 mg Oral BID   Or  . metoprolol tartrate  12.5 mg Per Tube BID  . multivitamin with minerals  1 tablet Oral Daily  . [START ON 07/04/2014] pantoprazole  40 mg Oral Daily  . simvastatin  20 mg Oral q1800  . [START ON 07/03/2014] sodium chloride  3 mL Intravenous Q12H  . vancomycin  1,250 mg Intravenous Q12H   Assessment: 68 yr old male who underwent AVR surgery today was to continue on Vancomycin and pharmacy was asked to dose.   Goal of Therapy:  Trough level 10-15  Plan:  Vancomycin 1250 mg IV q12 hrs. Levels when appropriate.  Minta Balsam 07/02/2014,11:28 PM

## 2014-07-03 ENCOUNTER — Inpatient Hospital Stay (HOSPITAL_COMMUNITY): Payer: Medicare HMO

## 2014-07-03 LAB — BASIC METABOLIC PANEL
Anion gap: 15 (ref 5–15)
BUN: 16 mg/dL (ref 6–23)
CHLORIDE: 102 meq/L (ref 96–112)
CO2: 21 mEq/L (ref 19–32)
Calcium: 6.9 mg/dL — ABNORMAL LOW (ref 8.4–10.5)
Creatinine, Ser: 0.99 mg/dL (ref 0.50–1.35)
GFR calc Af Amer: >90 mL/min (ref >90)
GFR calc non Af Amer: 82 mL/min — ABNORMAL LOW (ref >90)
Glucose, Bld: 126 mg/dL — ABNORMAL HIGH (ref 70–99)
POTASSIUM: 4.2 meq/L (ref 3.7–5.3)
SODIUM: 138 meq/L (ref 137–147)

## 2014-07-03 LAB — CBC
HCT: 28.5 % — ABNORMAL LOW (ref 39.0–52.0)
HEMATOCRIT: 29.1 % — AB (ref 39.0–52.0)
Hemoglobin: 10.2 g/dL — ABNORMAL LOW (ref 13.0–17.0)
Hemoglobin: 9.9 g/dL — ABNORMAL LOW (ref 13.0–17.0)
MCH: 31.4 pg (ref 26.0–34.0)
MCH: 31.5 pg (ref 26.0–34.0)
MCHC: 35.1 g/dL (ref 30.0–36.0)
MCHC: 34.7 g/dL (ref 30.0–36.0)
MCV: 89.5 fL (ref 78.0–100.0)
MCV: 90.8 fL (ref 78.0–100.0)
Platelets: 78 10*3/uL — ABNORMAL LOW (ref 150–400)
Platelets: 75 10*3/uL — ABNORMAL LOW (ref 150–400)
RBC: 3.25 MIL/uL — ABNORMAL LOW (ref 4.22–5.81)
RBC: 3.14 MIL/uL — ABNORMAL LOW (ref 4.22–5.81)
RDW: 13.5 % (ref 11.5–15.5)
RDW: 14 % (ref 11.5–15.5)
WBC: 13.1 10*3/uL — AB (ref 4.0–10.5)
WBC: 19.2 10*3/uL — ABNORMAL HIGH (ref 4.0–10.5)

## 2014-07-03 LAB — GLUCOSE, CAPILLARY
Glucose-Capillary: 102 mg/dL — ABNORMAL HIGH (ref 70–99)
Glucose-Capillary: 106 mg/dL — ABNORMAL HIGH (ref 70–99)
Glucose-Capillary: 116 mg/dL — ABNORMAL HIGH (ref 70–99)
Glucose-Capillary: 117 mg/dL — ABNORMAL HIGH (ref 70–99)
Glucose-Capillary: 119 mg/dL — ABNORMAL HIGH (ref 70–99)
Glucose-Capillary: 120 mg/dL — ABNORMAL HIGH (ref 70–99)
Glucose-Capillary: 120 mg/dL — ABNORMAL HIGH (ref 70–99)
Glucose-Capillary: 121 mg/dL — ABNORMAL HIGH (ref 70–99)
Glucose-Capillary: 122 mg/dL — ABNORMAL HIGH (ref 70–99)
Glucose-Capillary: 123 mg/dL — ABNORMAL HIGH (ref 70–99)
Glucose-Capillary: 126 mg/dL — ABNORMAL HIGH (ref 70–99)
Glucose-Capillary: 126 mg/dL — ABNORMAL HIGH (ref 70–99)
Glucose-Capillary: 126 mg/dL — ABNORMAL HIGH (ref 70–99)
Glucose-Capillary: 126 mg/dL — ABNORMAL HIGH (ref 70–99)
Glucose-Capillary: 128 mg/dL — ABNORMAL HIGH (ref 70–99)
Glucose-Capillary: 128 mg/dL — ABNORMAL HIGH (ref 70–99)
Glucose-Capillary: 148 mg/dL — ABNORMAL HIGH (ref 70–99)
Glucose-Capillary: 153 mg/dL — ABNORMAL HIGH (ref 70–99)
Glucose-Capillary: 159 mg/dL — ABNORMAL HIGH (ref 70–99)
Glucose-Capillary: 80 mg/dL (ref 70–99)

## 2014-07-03 LAB — POCT I-STAT 3, ART BLOOD GAS (G3+)
Acid-base deficit: 4 mmol/L — ABNORMAL HIGH (ref 0.0–2.0)
Bicarbonate: 21.4 mEq/L (ref 20.0–24.0)
O2 Saturation: 92 %
Patient temperature: 36.6
TCO2: 22 mmol/L (ref 0–100)
pCO2 arterial: 36.8 mmHg (ref 35.0–45.0)
pH, Arterial: 7.371 (ref 7.350–7.450)
pO2, Arterial: 64 mmHg — ABNORMAL LOW (ref 80.0–100.0)

## 2014-07-03 LAB — MAGNESIUM
MAGNESIUM: 2.4 mg/dL (ref 1.5–2.5)
MAGNESIUM: 2.4 mg/dL (ref 1.5–2.5)

## 2014-07-03 LAB — PREPARE PLATELET PHERESIS: UNIT DIVISION: 0

## 2014-07-03 LAB — CREATININE, SERUM
Creatinine, Ser: 1.23 mg/dL (ref 0.50–1.35)
GFR calc Af Amer: 68 mL/min — ABNORMAL LOW (ref >90)
GFR, EST NON AFRICAN AMERICAN: 59 mL/min — AB (ref >90)

## 2014-07-03 LAB — POCT I-STAT, CHEM 8
BUN: 22 mg/dL (ref 6–23)
Calcium, Ion: 1.07 mmol/L — ABNORMAL LOW (ref 1.13–1.30)
Chloride: 103 mEq/L (ref 96–112)
Creatinine, Ser: 1.2 mg/dL (ref 0.50–1.35)
Glucose, Bld: 173 mg/dL — ABNORMAL HIGH (ref 70–99)
HCT: 26 % — ABNORMAL LOW (ref 39.0–52.0)
Hemoglobin: 8.8 g/dL — ABNORMAL LOW (ref 13.0–17.0)
Potassium: 4.5 mEq/L (ref 3.7–5.3)
Sodium: 135 mEq/L — ABNORMAL LOW (ref 137–147)
TCO2: 20 mmol/L (ref 0–100)

## 2014-07-03 MED ORDER — INSULIN ASPART 100 UNIT/ML ~~LOC~~ SOLN
0.0000 [IU] | SUBCUTANEOUS | Status: DC
  Administered 2014-07-03 – 2014-07-04 (×5): 2 [IU] via SUBCUTANEOUS
  Administered 2014-07-04: 4 [IU] via SUBCUTANEOUS
  Administered 2014-07-04: 2 [IU] via SUBCUTANEOUS

## 2014-07-03 MED ORDER — FENTANYL CITRATE 0.05 MG/ML IJ SOLN
50.0000 ug | INTRAMUSCULAR | Status: DC | PRN
Start: 2014-07-03 — End: 2014-07-05
  Administered 2014-07-03 (×5): 50 ug via INTRAVENOUS
  Filled 2014-07-03 (×5): qty 2

## 2014-07-03 MED ORDER — CETYLPYRIDINIUM CHLORIDE 0.05 % MT LIQD
7.0000 mL | Freq: Two times a day (BID) | OROMUCOSAL | Status: DC
  Administered 2014-07-03 (×2): 7 mL via OROMUCOSAL

## 2014-07-03 MED ORDER — KETOROLAC TROMETHAMINE 15 MG/ML IJ SOLN
15.0000 mg | Freq: Four times a day (QID) | INTRAMUSCULAR | Status: AC
  Administered 2014-07-03 (×3): 15 mg via INTRAVENOUS
  Filled 2014-07-03 (×3): qty 1

## 2014-07-03 NOTE — Progress Notes (Signed)
Utilization Review Completed.Katisha Shimizu T10/03/2014  

## 2014-07-03 NOTE — Progress Notes (Signed)
Aprox 1 hour after pt was extubated, he began complaining of stabbing 10/10 pain mid chest at mediastinal chest tube insertion sites. Pain was unreleived by 10mg  Oxy IR and 6mg  Morphine. MD VanTrigt was called new orders received for changes to pain medication and STAT CXR obtained. Will continue to monitor and update MD as necessary

## 2014-07-03 NOTE — Progress Notes (Signed)
Patient ID: Jeremy Meadows, male   DOB: [birth year], 68 y.o.   MRN: [id number] TCTS DAILY ICU PROGRESS NOTE                   [address]            [place] [address]          [phone]   1 Day Post-Op Procedure(s) (LRB): AORTIC VALVE REPLACEMENT (AVR) with 23 Aortic Magna Ease (N/A) supra coronary  ASCENDING AORTIC REPLACEMENT to the Inominate Artery with 71mm Hemashield Platinum with  circulatory arrest (N/A) INTRAOPERATIVE TRANSESOPHAGEAL ECHOCARDIOGRAM (N/A)  Total Length of Stay:  LOS: 1 day   Subjective: Awake and alert neuro intact  Objective: Vital signs in last 24 hours: Temp:  [94.5 F (34.7 C)-99.1 F (37.3 C)] 97.7 F (36.5 C) (10/07 0800) Pulse Rate:  [70-97] 97 (10/07 0800) Cardiac Rhythm:  [-] Atrial paced (10/07 0800) Resp:  [0-26] 15 (10/07 0800) BP: (95-120)/(52-68) 96/55 mmHg (10/07 0800) SpO2:  [92 %-100 %] 95 % (10/07 0800) Arterial Line BP: (85-137)/(48-67) 97/55 mmHg (10/07 0800) FiO2 (%):  [40 %-50 %] 40 % (10/06 2206) Weight:  [222 lb 3.6 oz (100.8 kg)] 222 lb 3.6 oz (100.8 kg) (10/07 0341)  Filed Weights   07/01/14 1500 07/03/14 0341  Weight: 201 lb (91.173 kg) 222 lb 3.6 oz (100.8 kg)    Weight change: 21 lb 3.6 oz (9.627 kg)   Hemodynamic parameters for last 24 hours: PAP: (18-28)/(7-18) 27/16 mmHg CO:  [3.7 L/min-5.9 L/min] 5.9 L/min CI:  [1.7 L/min/m2-2.8 L/min/m2] 2.8 L/min/m2  Intake/Output from previous day: 10/06 0701 - 10/07 0700 In: 8377.1 [I.V.:5257.1; Blood:750; NG/GT:20; IV Piggyback:2350] Out: 5631 [Urine:3105; Emesis/NG output:100; Chest Tube:700]  Intake/Output this shift: Total I/O In: -  Out: 140 [Urine:60; Chest Tube:80]  Current Meds: Scheduled Meds: . acetaminophen  1,000 mg Oral 4 times per day   Or  . acetaminophen (TYLENOL) oral liquid 160 mg/5 mL  1,000 mg Per Tube 4 times per day  . antiseptic oral rinse  7 mL Mouth Rinse BID  . aspirin EC  325 mg Oral Daily   Or  . aspirin  324 mg  Per Tube Daily  . bisacodyl  10 mg Oral Daily   Or  . bisacodyl  10 mg Rectal Daily  . brinzolamide  1 drop Both Eyes BID  . budesonide-formoterol  2 puff Inhalation BID  . cefUROXime (ZINACEF)  IV  1.5 g Intravenous Q12H  . docusate sodium  200 mg Oral Daily  . famotidine (PEPCID) IV  20 mg Intravenous Q12H  . insulin regular  0-10 Units Intravenous TID WC  . ketorolac  15 mg Intravenous 4 times per day  . levalbuterol  0.63 mg Nebulization Q6H  . metoprolol tartrate  12.5 mg Oral BID   Or  . metoprolol tartrate  12.5 mg Per Tube BID  . multivitamin with minerals  1 tablet Oral Daily  . [START ON 07/04/2014] pantoprazole  40 mg Oral Daily  . simvastatin  20 mg Oral q1800  . sodium chloride  3 mL Intravenous Q12H  . vancomycin  1,250 mg Intravenous Q12H   Continuous Infusions: . sodium chloride 20 mL/hr at 07/03/14 0700  . sodium chloride 20 mL/hr at 07/02/14 1800  . sodium chloride    . dexmedetomidine Stopped (07/03/14 0200)  . DOPamine 5 mcg/kg/min (07/03/14 0700)  . insulin (NOVOLIN-R) infusion 2.8 Units/hr (07/03/14 0810)  . lactated ringers 20 mL/hr  at 07/03/14 0300  . nitroGLYCERIN Stopped (07/02/14 1630)  . phenylephrine (NEO-SYNEPHRINE) Adult infusion 50 mcg/min (07/03/14 0700)   PRN Meds:.albumin human, fentaNYL, metoprolol, midazolam, ondansetron (ZOFRAN) IV, oxyCODONE, sodium chloride, traMADol  General appearance: alert and cooperative Neurologic: intact Heart: regular rate and rhythm, S1, S2 normal, no murmur, click, rub or gallop Lungs: diminished breath sounds bibasilar Abdomen: soft, non-tender; bowel sounds normal; no masses,  no organomegaly Extremities: extremities normal, atraumatic, no cyanosis or edema and Homans sign is negative, no sign of DVT Wound: sternum stable dressing in place  Lab Results: CBC: Recent Labs  07/02/14 2216 07/02/14 2233 07/03/14 0358  WBC 11.4*  --  13.1*  HGB 11.3* 10.2* 10.2*  HCT 31.7* 30.0* 29.1*  PLT 88*  --  78*     BMET:  Recent Labs  07/02/14 2233 07/03/14 0358  NA 139 138  K 4.5 4.2  CL 105 102  CO2  --  21  GLUCOSE 126* 126*  BUN 13 16  CREATININE 0.90 0.99  CALCIUM  --  6.9*    PT/INR:  Recent Labs  07/02/14 1630  LABPROT 22.7*  INR 2.00*   Radiology: Dg Chest Portable 1 View In Am  07/03/2014   CLINICAL DATA:  Postop cardiac surgery, chest tube in place.  EXAM: PORTABLE CHEST - 1 VIEW  COMPARISON:  07/03/2014.  FINDINGS: Right IJ Swan-Ganz catheter tip projects over the proximal right pulmonary artery. Mediastinal drain, left chest tubes and epicardial pacer wires remain in place. Seven intact sternotomy wires.  Cardiomediastinal silhouette is prominent, stable. Lungs are somewhat low in volume. No definite pneumothorax. Small left pleural effusion and mild bibasilar atelectasis, stable.  IMPRESSION: Low lung volumes with a small left pleural effusion and mild bibasilar volume loss, stable.   Electronically Signed   By: Lorin Picket M.D.   On: 07/03/2014 08:15   Dg Chest Port 1 View  07/03/2014   CLINICAL DATA:  Status post open heart surgery. Postoperative radiograph; check chest tube placement.  EXAM: PORTABLE CHEST - 1 VIEW  COMPARISON:  Chest radiograph performed 07/02/2014  FINDINGS: The lungs are hypoexpanded. Left basilar airspace opacity likely reflects atelectasis. A small left pleural effusion may be present. No pneumothorax is seen.  The cardiomediastinal silhouette is mildly enlarged. The patient is status post sternotomy.  A right IJ Swan-Ganz catheter is seen ending overlying the main pulmonary outflow tract. The left-sided chest tube is seen in essentially unchanged position.  No acute osseous abnormalities are identified.  IMPRESSION: 1. Right IJ Swan-Ganz catheter is seen ending overlying the main pulmonary outflow tract. 2. Left-sided chest tube noted in essentially unchanged position. No evidence of pneumothorax. Left basilar airspace opacity likely reflects atelectasis.  Question of small left pleural effusion. 3. Mild cardiomegaly.  Lungs hypoexpanded.   Electronically Signed   By: Garald Balding M.D.   On: 07/03/2014 00:59   Dg Chest Portable 1 View  07/02/2014   CLINICAL DATA:  Postop  EXAM: PORTABLE CHEST - 1 VIEW  COMPARISON:  06/28/2014  FINDINGS: Endotracheal tube tip is 3.8 cm from the carina. NG tube tip is coiled in the stomach. Left chest and mediastinal tubes in place. No pneumothorax. Right internal jugular introducer and Swan-Ganz catheter tip in the main pulmonary artery. Low volumes with bibasilar atelectasis. Normal vascularity.  IMPRESSION: Postop chest radiograph. Tubular device is in appropriate position. No pneumothorax or CHF.   Electronically Signed   By: Maryclare Bean M.D.   On: 07/02/2014 17:07  Assessment/Plan: S/P Procedure(s) (LRB): AORTIC VALVE REPLACEMENT (AVR) with 23 Aortic Magna Ease (N/A) supra coronary  ASCENDING AORTIC REPLACEMENT to the Inominate Artery with 76mm Hemashield Platinum with  circulatory arrest (N/A) INTRAOPERATIVE TRANSESOPHAGEAL ECHOCARDIOGRAM (N/A) Mobilize Diuresis d/c tubes/lines Continue foley due to bladder outlet obstruction, strict I&O and patient in ICU See progression orders Thrombocytopenia- avoid heparin for now  Expected Acute  Blood - loss Anemia   Makenzie Vittorio B 07/03/2014 8:28 AM

## 2014-07-03 NOTE — Progress Notes (Signed)
POD # 1  Resting comfortably at present has been having issues with pain  BP 105/67  Pulse 91  Temp(Src) 98.2 F (36.8 C) (Core (Comment))  Resp 15  Ht 6' (1.829 m)  Wt 222 lb 3.6 oz (100.8 kg)  BMI 30.13 kg/m2  SpO2 94%   Intake/Output Summary (Last 24 hours) at 07/03/14 1652 Last data filed at 07/03/14 1500  Gross per 24 hour  Intake 4138.59 ml  Output   2987 ml  Net 1151.59 ml    CT 270 since 7 AM  Labs pending

## 2014-07-03 NOTE — Op Note (Signed)
Jeremy Meadows, Jeremy Meadows NO.:  [id number]  MEDICAL RECORD NO.:  [id number]  LOCATION:  2S16C                        FACILITY:  [facility]  PHYSICIAN:  Lanelle Bal, MD    DATE OF BIRTH:  Jun 08, 1946  DATE OF PROCEDURE:  07/02/2014 DATE OF DISCHARGE:                              OPERATIVE REPORT   PREOPERATIVE DIAGNOSES: 1. Critical aortic stenosis. 2. Ascending aortic aneurysm. 3. 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  POSTOPERATIVE DIAGNOSES: 1. Critical aortic stenosis. 2. Ascending aortic aneurysm. 3. 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  PROCEDURE:  Aortic valve replacement with [facility] pericardial tissue valve 23 mm, model 3300TFX, serial #[id number]. Supracoronary replacement of ascending aorta with Hemashield graft 32 mm with cardiopulmonary bypass and circulatory arrest.  Right axillary arterial cannulation.  BRIEF HISTORY:  The patient is a 68 year old male who was originally referred for a new left lung nodule.  CT scan showed dilated ascending aorta to 5.2 cm.  Further condition, the patient had critical aortic stenosis with velocity across his aortic valve of the greater than 4 cm/second.  In evaluation, a needle biopsy of the left lung nodule was performed which showed granuloma.  Because of the patient's increasing symptoms of shortness of breath and "pulmonary asthma" with known severe aortic stenosis, aortic valve replacement and replacement of the ascending aorta was recommended to the patient who agreed and signed informed consent.  Risks and options were discussed in detail.  In discussion with the patient, the tissue valve versus mechanical valve, he preferred a tissue valve to avoid Coumadin.  Cardiac catheterization was performed which showed the patient was free of coronary disease. The right coronary artery was not smaller and nondominant.  He had a relatively short left main coronary artery but  without any significant stenosis.  DESCRIPTION OF PROCEDURE:  With Swan-Ganz and arterial line monitors in place, the patient underwent general endotracheal anesthesia without incident.  Appropriate time-out was performed.  A TEE probe was placed and description of the findings are under separate note but this did confirm the patient's critical aortic stenosis and evidence of left ventricular hypertrophy.  His aortic root at the anulus and sino-tubular ridge were approximately 3.5 cm.  The ascending aorta by CT scan was 5.2 cm and this extended close to the narrowing somewhat as it approached the takeoff of the innominate artery into the arch and it decreased 3 cm.  After appropriate time-out was performed, knowing that we needed to go up 2 to at least the innominate artery and use circulatory arrest for the distal anastomosis.  We first proceeded with incision in the right infraclavicular area separating the muscle fibers of the pectoralis major retracting the pectoralis minor inferiorly and identifying the subclavian artery.  This was encircled with vessel loops.  We then proceeded with formal midline sternotomy.  Pericardium was opened though as noted the patient's aorta was dilated particularly at the maximum dilatation in the mid ascending aorta.  The patient was systemically heparinized.  The right subclavian artery was isolated.  Vascular clamps were placed proximally and distally and the vessel was opened and 8 mm Terumo arterial graft was then sewed to the  subclavian artery and connected to the cardiopulmonary bypass after flushing all air out of the system.  We then proceeded with placement of the retrograde cardioplegia catheter and a dual venous stage cannula into the right atrium.  We further dissected both superiorly and inferiorly along the route.  We then went on cardiopulmonary bypass 2.4 L/min/m2.  Right superior pulmonary vein vent was placed.  We then proceeded with  cooling the patient to 18 degrees knowing that the distal anastomosis would require circ arrest for adequate anastomosis.  The aortic root vent cardioplegia needle was introduced into the midportion of the ascending aorta.  The aortic cross-clamp was applied in the midportion of the aorta portion that was going to be resected.  Cold blood cardioplegia was administered antegrade and retrograde.  Myocardial septal temperature was monitored throughout the crossclamp.  We proceeded to the proximal anastomosis and valve replacement while we were cooling. The aorta was trimmed to just above the sino-tubular ridge.  At this level, the aorta was just under 3 cm in size.  The aortic valve was trileaflet, highly calcified aortic valve.  The valve was excised taking care to remove all loose calcific debris.  A 23 pericardial tissue valve was incised for the anulus.  The #2 Tycron pledgeted sutures were placed circumferentially around the anulus with the pledgets on the ventricular surface.  This was used to secure and seek a 23-mm pericardial Magna Ease tissue valve.  The valve seated well without obstruction of the left or right coronary ostium.  A straight 32-mm Hemashield graft was trimmed to appropriate length, and sewed to the aorta in the supracoronary fashion with a running 3-0 Prolene with felt strips.  They continued to cool the patient to 18 degrees.  The head was put in the down position and hypothermic circulatory arrest was commenced after administration of steroids and etomidate.  The aortic cross-clamp was removed as we started circulatory arrest.  The innominate artery at its origin was then clamped with a vascular clamp and antegrade circulatory perfusion was commenced.  The aorta was trimmed laterally just prior to the takeoff of the innominate artery and slightly tapered to the undersurface of the arch.  They felt the aortic graft was then cut to the appropriate length and over  felt strips, a running 3-0 Prolene was used to perform the distal anastomosis as we completed the anastomosis. We slowly began and before completion, slowly removed these innominate artery clamp and slowly reperfused the aorta de-airing through the anastomosis.  Full flow was we commenced with a total circ arrest time of 22 minutes and antegrade cerebral perfusion time of 18 minutes.  An aortic root vent cardioplegia needle was placed in the ascending aorta to further assist in de-airing.  An 18-gauge needle was introduced into the left ventricular apex to further de-air.  During the procedure, intermittent cold retrograde cardioplegia was administered.  Just prior to full reperfusion, warm retrograde cardioplegia was administered. With the patient back on full pulmonary cardiopulmonary support, his body temperature was rewarmed back to 37 degrees.  He has spontaneously converted to a sinus rhythm.  Atrial and ventricular pacing wires were applied.  The retrograde cardioplegia catheter was removed.  A TEE probe showed good functioning of the aortic valve prosthesis.  The right superior pulmonary vein vent was removed.  With the patient fully rewarmed, he was weaned from cardiopulmonary bypass.  He remained hemodynamically stable on low-dose dopamine.  He did require significant amount of volume with his significant  ventricular hypertrophy.  He was decannulated.  Protamine sulfate was administered.  The arterial graft that had been sewed to the subclavian artery was stapled with a vascular stapler.  The patient was given packed red blood cells while on bypass because of low hematocrit.  In addition, his platelet count was 44,000 and 1 bag of platelets were administered after separation from bypass. The patient was recently hemostatic.  Left chest tube was placed.  Two Blake drains were left in the mediastinum.  At the beginning of the procedure, we had made an attempt to locate the left  lower lobe lung nodule.  This could be palpated but was very posterior with some lung adhesions and it was not felt that we could safely especially with the patient heparinized, delivered the area for a safe wedge resection, so we abandoned attempting to remove the nodule.  With the patient's pericardium was loosely reapproximated, the sternum was closed with #6 stainless steel wire.  Fascia was closed with interrupted 0 Vicryl, running 0 Vicryl in subcutaneous tissue.  A 3-0 subcuticular stitch in skin edges.  Dry dressings were applied.  Sponge and needle count was reported as correct at the completion of procedure.  The patient tolerated the procedure without obvious complication and was transferred to Surgical Intensive Care Unit for further postoperative care.  The right infraclavicular arterial cannulation exposure was closed and the muscle layers with interrupted 2-0 Vicryl, running 2-0 Vicryl in the subcutaneous tissue, and 3-0 subcuticular stitch in the skin edges. Total pump time was 253 minutes.     Lanelle Bal, MD     EG/MEDQ  D:  07/03/2014  T:  07/03/2014  Job:  [id number]

## 2014-07-04 ENCOUNTER — Inpatient Hospital Stay (HOSPITAL_COMMUNITY): Payer: Medicare HMO

## 2014-07-04 ENCOUNTER — Encounter (HOSPITAL_COMMUNITY): Payer: Self-pay | Admitting: Cardiothoracic Surgery

## 2014-07-04 DIAGNOSIS — I350 Nonrheumatic aortic (valve) stenosis: Principal | ICD-10-CM

## 2014-07-04 LAB — BASIC METABOLIC PANEL
Anion gap: 11 (ref 5–15)
BUN: 29 mg/dL — ABNORMAL HIGH (ref 6–23)
CO2: 23 mEq/L (ref 19–32)
Calcium: 7.3 mg/dL — ABNORMAL LOW (ref 8.4–10.5)
Chloride: 102 mEq/L (ref 96–112)
Creatinine, Ser: 1.09 mg/dL (ref 0.50–1.35)
GFR calc Af Amer: 79 mL/min — ABNORMAL LOW (ref >90)
GFR calc non Af Amer: 68 mL/min — ABNORMAL LOW (ref >90)
Glucose, Bld: 138 mg/dL — ABNORMAL HIGH (ref 70–99)
Potassium: 4.6 mEq/L (ref 3.7–5.3)
Sodium: 136 mEq/L — ABNORMAL LOW (ref 137–147)

## 2014-07-04 LAB — GLUCOSE, CAPILLARY
Glucose-Capillary: 144 mg/dL — ABNORMAL HIGH (ref 70–99)
Glucose-Capillary: 142 mg/dL — ABNORMAL HIGH (ref 70–99)
Glucose-Capillary: 129 mg/dL — ABNORMAL HIGH (ref 70–99)
Glucose-Capillary: 121 mg/dL — ABNORMAL HIGH (ref 70–99)
Glucose-Capillary: 114 mg/dL — ABNORMAL HIGH (ref 70–99)
Glucose-Capillary: 116 mg/dL — ABNORMAL HIGH (ref 70–99)

## 2014-07-04 LAB — CBC
HCT: 25.5 % — ABNORMAL LOW (ref 39.0–52.0)
Hemoglobin: 8.7 g/dL — ABNORMAL LOW (ref 13.0–17.0)
MCH: 31.6 pg (ref 26.0–34.0)
MCHC: 34.1 g/dL (ref 30.0–36.0)
MCV: 92.7 fL (ref 78.0–100.0)
Platelets: 62 10*3/uL — ABNORMAL LOW (ref 150–400)
RBC: 2.75 MIL/uL — ABNORMAL LOW (ref 4.22–5.81)
RDW: 14.1 % (ref 11.5–15.5)
WBC: 16 10*3/uL — ABNORMAL HIGH (ref 4.0–10.5)

## 2014-07-04 MED ORDER — PROMETHAZINE HCL 25 MG/ML IJ SOLN: 12.5000 mg | Freq: Four times a day (QID) | INTRAMUSCULAR | Status: DC | PRN

## 2014-07-04 MED FILL — Electrolyte-R (PH 7.4) Solution: INTRAVENOUS | Qty: 5000 | Status: AC

## 2014-07-04 MED FILL — Potassium Chloride Inj 2 mEq/ML: INTRAVENOUS | Qty: 40 | Status: AC

## 2014-07-04 MED FILL — Lidocaine HCl IV Inj 20 MG/ML: INTRAVENOUS | Qty: 5 | Status: AC

## 2014-07-04 MED FILL — Magnesium Sulfate Inj 50%: INTRAMUSCULAR | Qty: 10 | Status: AC

## 2014-07-04 MED FILL — Dexmedetomidine HCl IV Soln 200 MCG/2ML: INTRAVENOUS | Qty: 2 | Status: AC

## 2014-07-04 MED FILL — Sodium Chloride IV Soln 0.9%: INTRAVENOUS | Qty: 3000 | Status: AC

## 2014-07-04 MED FILL — Heparin Sodium (Porcine) Inj 1000 Unit/ML: INTRAMUSCULAR | Qty: 30 | Status: AC

## 2014-07-04 MED FILL — Sodium Bicarbonate IV Soln 8.4%: INTRAVENOUS | Qty: 50 | Status: AC

## 2014-07-04 MED FILL — Heparin Sodium (Porcine) Inj 1000 Unit/ML: INTRAMUSCULAR | Qty: 20 | Status: AC

## 2014-07-04 MED FILL — Mannitol IV Soln 20%: INTRAVENOUS | Qty: 500 | Status: AC

## 2014-07-04 NOTE — Progress Notes (Signed)
Patient ID: JOSEPHUS HARRIGER, male   DOB: [birth year], 68 y.o.   MRN: [id number] TCTS DAILY ICU PROGRESS NOTE                   [address]            [place] [address]          [phone]   2 Days Post-Op Procedure(s) (LRB): AORTIC VALVE REPLACEMENT (AVR) with 23 Aortic Magna Ease (N/A) supra coronary  ASCENDING AORTIC REPLACEMENT to the Inominate Artery with 63mm Hemashield Platinum with  circulatory arrest (N/A) INTRAOPERATIVE TRANSESOPHAGEAL ECHOCARDIOGRAM (N/A)  Total Length of Stay:  LOS: 2 days   Subjective: Up to chair, ambulating   Objective: Vital signs in last 24 hours: Temp:  [97.7 F (36.5 C)-98.7 F (37.1 C)] 98.7 F (37.1 C) (10/08 0713) Pulse Rate:  [83-102] 93 (10/08 0700) Cardiac Rhythm:  [-] Normal sinus rhythm (10/07 2000) Resp:  [12-25] 14 (10/08 0700) BP: (96-127)/(51-70) 109/69 mmHg (10/08 0700) SpO2:  [89 %-96 %] 94 % (10/08 0740) Arterial Line BP: (83-131)/(19-76) 110/46 mmHg (10/08 0700) Weight:  [224 lb 13.9 oz (102 kg)] 224 lb 13.9 oz (102 kg) (10/08 0500)  Filed Weights   07/01/14 1500 07/03/14 0341 07/04/14 0500  Weight: 201 lb (91.173 kg) 222 lb 3.6 oz (100.8 kg) 224 lb 13.9 oz (102 kg)    Weight change: 2 lb 10.3 oz (1.2 kg)   Hemodynamic parameters for last 24 hours: PAP: (26)/(17) 26/17 mmHg CO:  [5.9 L/min] 5.9 L/min CI:  [2.8 L/min/m2] 2.8 L/min/m2  Intake/Output from previous day: 10/07 0701 - 10/08 0700 In: 2361.3 [P.O.:180; I.V.:1581.3; IV Piggyback:600] Out: 1078 [Urine:788; Chest Tube:290]  Intake/Output this shift:    Current Meds: Scheduled Meds: . acetaminophen  1,000 mg Oral 4 times per day   Or  . acetaminophen (TYLENOL) oral liquid 160 mg/5 mL  1,000 mg Per Tube 4 times per day  . aspirin EC  325 mg Oral Daily   Or  . aspirin  324 mg Per Tube Daily  . bisacodyl  10 mg Oral Daily   Or  . bisacodyl  10 mg Rectal Daily  . brinzolamide  1 drop Both Eyes BID  . budesonide-formoterol  2 puff  Inhalation BID  . cefUROXime (ZINACEF)  IV  1.5 g Intravenous Q12H  . docusate sodium  200 mg Oral Daily  . insulin aspart  0-24 Units Subcutaneous 6 times per day  . levalbuterol  0.63 mg Nebulization Q6H  . metoprolol tartrate  12.5 mg Oral BID   Or  . metoprolol tartrate  12.5 mg Per Tube BID  . multivitamin with minerals  1 tablet Oral Daily  . pantoprazole  40 mg Oral Daily  . simvastatin  20 mg Oral q1800  . sodium chloride  3 mL Intravenous Q12H  . vancomycin  1,250 mg Intravenous Q12H   Continuous Infusions: . sodium chloride 20 mL/hr at 07/03/14 0700  . sodium chloride 20 mL/hr at 07/02/14 1800  . sodium chloride    . DOPamine 4 mcg/kg/min (07/04/14 0600)  . lactated ringers 20 mL/hr at 07/03/14 0300  . nitroGLYCERIN Stopped (07/02/14 1630)  . phenylephrine (NEO-SYNEPHRINE) Adult infusion Stopped (07/04/14 0500)   PRN Meds:.fentaNYL, metoprolol, midazolam, ondansetron (ZOFRAN) IV, oxyCODONE, sodium chloride, traMADol  General appearance: alert, cooperative and no distress Neurologic: intact Heart: regular rate and rhythm, S1, S2 normal, no murmur, click, rub or gallop Lungs: diminished breath sounds bibasilar Abdomen: soft,  non-tender; bowel sounds normal; no masses,  no organomegaly Extremities: extremities normal, atraumatic, no cyanosis or edema and Homans sign is negative, no sign of DVT Wound: dressing intact  Lab Results: CBC: Recent Labs  07/03/14 1637 07/03/14 1715 07/04/14 0400  WBC 19.2*  --  16.0*  HGB 9.9* 8.8* 8.7*  HCT 28.5* 26.0* 25.5*  PLT 75*  --  62*   BMET:  Recent Labs  07/03/14 0358  07/03/14 1715 07/04/14 0400  NA 138  --  135* 136*  K 4.2  --  4.5 4.6  CL 102  --  103 102  CO2 21  --   --  23  GLUCOSE 126*  --  173* 138*  BUN 16  --  22 29*  CREATININE 0.99  < > 1.20 1.09  CALCIUM 6.9*  --   --  7.3*  < > = values in this interval not displayed.  PT/INR:  Recent Labs  07/02/14 1630  LABPROT 22.7*  INR 2.00*    Radiology: Dg Chest Port 1 View  07/04/2014   CLINICAL DATA:  Followup for cardiac surgery, chest soreness today  EXAM: PORTABLE CHEST - 1 VIEW  COMPARISON:  07/03/2014  FINDINGS: Moderate to severe cardiac enlargement. Replacement cardiac valve noted. Swan-Ganz has been removed with vascular sheath in its place extending into superior vena cava. Left chest tube has been removed.  Very limited inspiratory effect. There is vascular congestion. Hazy density at the right base suggesting small effusion and atelectasis. Left basilar opacity also suggests lower lobe consolidation on the left. Bibasilar opacities are worse when compared to the prior study.  IMPRESSION: Decreased inspiratory effect when compared to prior study with bibasilar opacities suggesting consolidation possibly atelectasis. Also, small pleural effusions suspected.   Electronically Signed   By: Skipper Cliche M.D.   On: 07/04/2014 07:48    Assessment/Plan: S/P Procedure(s) (LRB): AORTIC VALVE REPLACEMENT (AVR) with 23 Aortic Magna Ease (N/A) supra coronary  ASCENDING AORTIC REPLACEMENT to the Inominate Artery with 22mm Hemashield Platinum with  circulatory arrest (N/A) INTRAOPERATIVE TRANSESOPHAGEAL ECHOCARDIOGRAM (N/A) Mobilize d/c tubes/lines Plan for transfer to step-down: see transfer orders Thrombocytopenia  Avoid heparin for now Weaning off dopamine- if off transfer to circle later today    Lovely Kerins B 07/04/2014 7:52 AM

## 2014-07-04 NOTE — Progress Notes (Signed)
Patient ID: Jeremy Meadows, male   DOB: 28-Mar-1946, 68 y.o.   MRN: [id number] EVENING ROUNDS NOTE :     [address]       [place] [address]             [phone]                 2 Days Post-Op Procedure(s) (LRB): AORTIC VALVE REPLACEMENT (AVR) with 23 Aortic Magna Ease (N/A) supra coronary  ASCENDING AORTIC REPLACEMENT to the Inominate Artery with 61mm Hemashield Platinum with  circulatory arrest (N/A) INTRAOPERATIVE TRANSESOPHAGEAL ECHOCARDIOGRAM (N/A)  Total Length of Stay:  LOS: 2 days  BP 102/62  Pulse 97  Temp(Src) 98.3 F (36.8 C) (Oral)  Resp 13  Ht 6' (1.829 m)  Wt 224 lb 13.9 oz (102 kg)  BMI 30.49 kg/m2  SpO2 96%  .Intake/Output     10/08 0701 - 10/09 0700   P.O. 480   I.V. (mL/kg) 195.2 (1.9)   IV Piggyback 300   Total Intake(mL/kg) 975.2 (9.6)   Urine (mL/kg/hr) 275 (0.2)   Chest Tube    Total Output 275   Net +700.2         . sodium chloride Stopped (07/04/14 0800)  . sodium chloride Stopped (07/04/14 1400)  . sodium chloride    . DOPamine Stopped (07/04/14 1015)  . lactated ringers Stopped (07/04/14 1015)  . nitroGLYCERIN Stopped (07/02/14 1630)  . phenylephrine (NEO-SYNEPHRINE) Adult infusion Stopped (07/04/14 0500)     Lab Results  Component Value Date   WBC 16.0* 07/04/2014   HGB 8.7* 07/04/2014   HCT 25.5* 07/04/2014   PLT 62* 07/04/2014   GLUCOSE 138* 07/04/2014   CHOL 140 02/27/2013   TRIG 121.0 02/27/2013   HDL 34.80* 02/27/2013   LDLCALC 81 02/27/2013   ALT 22 06/28/2014   AST 30 06/28/2014   NA 136* 07/04/2014   K 4.6 07/04/2014   CL 102 07/04/2014   CREATININE 1.09 07/04/2014   BUN 29* 07/04/2014   CO2 23 07/04/2014   TSH 1.28 12/02/2011   PSA 1.34 11/23/2013   INR 2.00* 07/02/2014   HGBA1C 5.7* 06/28/2014   Stable day Waiting for bed 2w  Grace Isaac MD  Beeper [phone] Office [phone] 07/04/2014 7:28 PM

## 2014-07-05 ENCOUNTER — Inpatient Hospital Stay (HOSPITAL_COMMUNITY): Payer: Medicare HMO

## 2014-07-05 LAB — BASIC METABOLIC PANEL
Anion gap: 11 (ref 5–15)
BUN: 29 mg/dL — ABNORMAL HIGH (ref 6–23)
CO2: 24 mEq/L (ref 19–32)
Calcium: 8.2 mg/dL — ABNORMAL LOW (ref 8.4–10.5)
Chloride: 99 mEq/L (ref 96–112)
Creatinine, Ser: 0.94 mg/dL (ref 0.50–1.35)
GFR calc Af Amer: >90 mL/min (ref >90)
GFR calc non Af Amer: 84 mL/min — ABNORMAL LOW (ref >90)
Glucose, Bld: 146 mg/dL — ABNORMAL HIGH (ref 70–99)
Potassium: 4.5 mEq/L (ref 3.7–5.3)
Sodium: 134 mEq/L — ABNORMAL LOW (ref 137–147)

## 2014-07-05 LAB — GLUCOSE, CAPILLARY
Glucose-Capillary: 92 mg/dL (ref 70–99)
Glucose-Capillary: 102 mg/dL — ABNORMAL HIGH (ref 70–99)
Glucose-Capillary: 113 mg/dL — ABNORMAL HIGH (ref 70–99)
Glucose-Capillary: 140 mg/dL — ABNORMAL HIGH (ref 70–99)
Glucose-Capillary: 122 mg/dL — ABNORMAL HIGH (ref 70–99)

## 2014-07-05 LAB — CBC
HCT: 25.2 % — ABNORMAL LOW (ref 39.0–52.0)
Hemoglobin: 8.6 g/dL — ABNORMAL LOW (ref 13.0–17.0)
MCH: 31 pg (ref 26.0–34.0)
MCHC: 34.1 g/dL (ref 30.0–36.0)
MCV: 91 fL (ref 78.0–100.0)
Platelets: 77 10*3/uL — ABNORMAL LOW (ref 150–400)
RBC: 2.77 MIL/uL — ABNORMAL LOW (ref 4.22–5.81)
RDW: 14 % (ref 11.5–15.5)
WBC: 7.3 10*3/uL (ref 4.0–10.5)

## 2014-07-05 MED ORDER — ONDANSETRON HCL 4 MG/2ML IJ SOLN: 4.0000 mg | Freq: Four times a day (QID) | INTRAMUSCULAR | Status: DC | PRN

## 2014-07-05 MED ORDER — OXYCODONE HCL 5 MG PO TABS: 5.0000 mg | ORAL_TABLET | ORAL | Status: DC | PRN

## 2014-07-05 MED ORDER — SODIUM CHLORIDE 0.9 % IJ SOLN
3.0000 mL | Freq: Two times a day (BID) | INTRAMUSCULAR | Status: DC
  Administered 2014-07-05 – 2014-07-08 (×4): 3 mL via INTRAVENOUS

## 2014-07-05 MED ORDER — BISACODYL 10 MG RE SUPP: 10.0000 mg | Freq: Every day | RECTAL | Status: DC | PRN

## 2014-07-05 MED ORDER — AMIODARONE HCL IN DEXTROSE 360-4.14 MG/200ML-% IV SOLN
60.0000 mg/h | INTRAVENOUS | Status: AC
Start: 2014-07-05 — End: 2014-07-06
  Administered 2014-07-05 – 2014-07-06 (×2): 60 mg/h via INTRAVENOUS
  Filled 2014-07-05 (×2): qty 200

## 2014-07-05 MED ORDER — PANTOPRAZOLE SODIUM 40 MG PO TBEC
40.0000 mg | DELAYED_RELEASE_TABLET | Freq: Every day | ORAL | Status: DC
  Administered 2014-07-05 – 2014-07-08 (×4): 40 mg via ORAL
  Filled 2014-07-05 (×3): qty 1

## 2014-07-05 MED ORDER — AMIODARONE LOAD VIA INFUSION
150.0000 mg | Freq: Once | INTRAVENOUS | Status: DC
  Filled 2014-07-05: qty 83.34

## 2014-07-05 MED ORDER — INSULIN ASPART 100 UNIT/ML ~~LOC~~ SOLN
0.0000 [IU] | Freq: Three times a day (TID) | SUBCUTANEOUS | Status: DC
  Administered 2014-07-05 (×2): 2 [IU] via SUBCUTANEOUS

## 2014-07-05 MED ORDER — METOPROLOL TARTRATE 12.5 MG HALF TABLET
12.5000 mg | ORAL_TABLET | Freq: Two times a day (BID) | ORAL | Status: DC
  Administered 2014-07-05 (×2): 12.5 mg via ORAL
  Filled 2014-07-05 (×4): qty 1

## 2014-07-05 MED ORDER — SODIUM CHLORIDE 0.9 % IV SOLN: 250.0000 mL | INTRAVENOUS | Status: DC | PRN

## 2014-07-05 MED ORDER — LEVALBUTEROL HCL 0.63 MG/3ML IN NEBU
0.6300 mg | INHALATION_SOLUTION | Freq: Three times a day (TID) | RESPIRATORY_TRACT | Status: DC
  Administered 2014-07-06 (×2): 0.63 mg via RESPIRATORY_TRACT
  Filled 2014-07-05 (×5): qty 3

## 2014-07-05 MED ORDER — FUROSEMIDE 40 MG PO TABS
40.0000 mg | ORAL_TABLET | Freq: Every day | ORAL | Status: AC
  Administered 2014-07-05 – 2014-07-07 (×3): 40 mg via ORAL
  Filled 2014-07-05 (×3): qty 1

## 2014-07-05 MED ORDER — AMIODARONE HCL IN DEXTROSE 360-4.14 MG/200ML-% IV SOLN
30.0000 mg/h | INTRAVENOUS | Status: DC
  Administered 2014-07-06 – 2014-07-07 (×2): 30 mg/h via INTRAVENOUS
  Filled 2014-07-05 (×6): qty 200

## 2014-07-05 MED ORDER — DOCUSATE SODIUM 100 MG PO CAPS
200.0000 mg | ORAL_CAPSULE | Freq: Every day | ORAL | Status: DC
  Administered 2014-07-06 – 2014-07-08 (×3): 200 mg via ORAL
  Filled 2014-07-05 (×3): qty 2

## 2014-07-05 MED ORDER — ONDANSETRON HCL 4 MG PO TABS: 4.0000 mg | ORAL_TABLET | Freq: Four times a day (QID) | ORAL | Status: DC | PRN

## 2014-07-05 MED ORDER — POTASSIUM CHLORIDE CRYS ER 20 MEQ PO TBCR
20.0000 meq | EXTENDED_RELEASE_TABLET | Freq: Every day | ORAL | Status: DC
  Administered 2014-07-05 – 2014-07-06 (×2): 20 meq via ORAL
  Filled 2014-07-05 (×3): qty 1

## 2014-07-05 MED ORDER — BISACODYL 5 MG PO TBEC: 10.0000 mg | DELAYED_RELEASE_TABLET | Freq: Every day | ORAL | Status: DC | PRN

## 2014-07-05 MED ORDER — SODIUM CHLORIDE 0.9 % IJ SOLN: 3.0000 mL | INTRAMUSCULAR | Status: DC | PRN

## 2014-07-05 MED ORDER — MOVING RIGHT ALONG BOOK
Freq: Once | Status: AC
  Administered 2014-07-05: 08:00:00
  Filled 2014-07-05: qty 1

## 2014-07-05 MED ORDER — TRAMADOL HCL 50 MG PO TABS
50.0000 mg | ORAL_TABLET | ORAL | Status: DC | PRN
  Administered 2014-07-06 (×2): 50 mg via ORAL
  Administered 2014-07-07 – 2014-07-08 (×4): 100 mg via ORAL
  Filled 2014-07-05 (×4): qty 2
  Filled 2014-07-05 (×2): qty 1

## 2014-07-05 NOTE — Progress Notes (Signed)
Amiodarone Drug - Drug Interaction Consult Note  Recommendations: Jeremy Meadows is on a few medications that may interact with amiodarone. They are listed below along with the potential result of the interaction. Would recommend monitoring closely.  Amiodarone is metabolized by the cytochrome P450 system and therefore has the potential to cause many drug interactions. Amiodarone has an average plasma half-life of 50 days (range 20 to 100 days).   There is potential for drug interactions to occur several weeks or months after stopping treatment and the onset of drug interactions may be slow after initiating amiodarone.   [x]  Statins: Increased risk of myopathy. Simvastatin- restrict dose to 20mg  daily. Other statins: counsel patients to report any muscle pain or weakness immediately.   [x]  Beta blockers: increased risk of bradycardia, AV block and myocardial depression. Sotalol - avoid concomitant use.   [x]  Diuretics: increased risk of cardiotoxicity if hypokalemia occurs.   Thank You,   Andrey Cota. Diona Foley, PharmD Clinical Pharmacist Pager [phone]  07/05/2014 8:20 PM

## 2014-07-05 NOTE — Progress Notes (Signed)
Patient heart rate ranging from 140 to 160's per minute,on bed asymptomatic,afib RVR as confirmed with 12 leads EKG,BP is 106/60,started on O2 at 2LPM/[place] saturation is 99. Dr. Roxan Hockey made aware with order to start amiodarone drip.Will continue to monitor. Arena Lindahl Netta Corrigan RN

## 2014-07-05 NOTE — Discharge Instructions (Signed)
Activity: 1.May walk up steps                2.No lifting more than ten pounds for four weeks.                 3.No driving for four weeks.                4.Stop any activity that causes chest pain, shortness of breath, dizziness, sweating or excessive weakness.                5.Avoid straining.                6.Continue with your breathing exercises daily.  Diet: Diabetic, low fat, Low salt diet  Wound Care: May shower.  Clean wounds with mild soap and water daily. Contact the office at [phone] if any problems arise.  Aortic Valve Replacement, Care After Refer to this sheet in the next few weeks. These instructions provide you with information on caring for yourself after your procedure. Your health care provider may also give you specific instructions. Your treatment has been planned according to current medical practices, but problems sometimes occur. Call your health care provider if you have any problems or questions after your procedure. HOME CARE INSTRUCTIONS   Take medicines only as directed by your health care provider.  If your health care provider has prescribed elastic stockings, wear them as directed.  Take frequent naps or rest often throughout the day.  Avoid lifting over 10 lbs (4.5 kg) or pushing or pulling things with your arms for 6-8 weeks or as directed by your health care provider.  Avoid driving or airplane travel for 4-6 weeks after surgery or as directed by your health care provider. If you are riding in a car for an extended period, stop every 1-2 hours to stretch your legs. Keep a record of your medicines and medical history with you when traveling.  Do not drive or operate heavy machinery while taking pain medicine. (narcotics).  Do not cross your legs.  Do not use any tobacco products including cigarettes, chewing tobacco, or electronic cigarettes. If you need help quitting, ask your health care provider.  Do not take baths, swim, or use a hot tub  until your health care provider approves. Take showers once your health care provider approves. Pat incisions dry. Do not rub incisions with a washcloth or towel.  Avoid climbing stairs and using the handrail to pull yourself up for the first 2-3 weeks after surgery.  Return to work as directed by your health care provider.  Drink enough fluid to keep your urine clear or pale yellow.  Do not strain to have a bowel movement. Eat high-fiber foods if you become constipated. You may also take a medicine to help you have a bowel movement (laxative) as directed by your health care provider.  Resume sexual activity as directed by your health care provider. Men should not use medicines for erectile dysfunction until their doctor says it isokay.  If you had a certain type of heart condition in the past, you may need to take antibiotic medicine before having dental work or surgery. Let your dentist and health care providers know if you had one or more of the following:  Previous endocarditis.  An artificial (prosthetic) heart valve.  Congenital heart disease. SEEK MEDICAL CARE IF:  You develop a skin rash.   You experience sudden changes in your weight.  You have a fever. SEEK IMMEDIATE  MEDICAL CARE IF:   You develop chest pain that is not coming from your incision.  You have drainage (pus), redness, swelling, or pain at your incision site.   You develop shortness of breath or have difficulty breathing.   You have increased bleeding from your incision site.   You develop light-headedness.  MAKE SURE YOU:   Understand these directions.  Will watch your condition.  Will get help right away if you are not doing well or get worse. Document Released: 04/01/2005 Document Revised: 01/28/2014 Document Reviewed: 06/27/2012 [facility] Patient Information 2015 [place], [place]. This information is not intended to replace advice given to you by your health care provider. Make sure you  discuss any questions you have with your health care provider.

## 2014-07-05 NOTE — Progress Notes (Addendum)
IV team came to assess patient for an IV; IV team was unable to gain IV assess after 3 attempts; called IV team to get a 2nd IV nurse to come assess-IV team said they would be on there way following report.  Rowe Pavy, RN

## 2014-07-05 NOTE — Discharge Summary (Signed)
Physician Discharge Summary       [address]       [place] [address]             [phone]    Patient ID: Jeremy Meadows MRN: [id number] DOB/AGE: 30-Aug-1946 68 y.o.  Admit date: 07/02/2014 Discharge date: 07/08/2014  Admission Diagnoses: 1. Ascending thoracic aortic aneurysm 2. Severe aortic stenosis 3. Left lung granuloma 4. History of hyperlipidemia 5. History of hypertension 6. History of OSA  Discharge Diagnoses:  1. Ascending thoracic aortic aneurysm 2. Severe aortic stenosis 3. Left lung granuloma 4. History of hyperlipidemia 5. History of hypertension 6. History of OSA 7. Thrombocytopenia 8. ABL anemia 9. A fib with RVR (converted to SR)  Procedure (s):  Aortic valve replacement with [facility]  pericardial tissue valve 23 mm, model 3300TFX, serial #[id number]. Supracoronary replacement of ascending aorta with Hemashield graft 32 mm with cardiopulmonary bypass and circulatory arrest. Right axillary arterial cannulation by Dr. [name] on 07/02/2014.  History of Presenting Illness: This is a 68 year old Caucasian male who one year ago  was noted by Dr. Lenna Gilford have a murmur. Evaluation for this, included a CT scan of the chest which demonstrated a 5.1 cm dilated ascending aorta. Echocardiogram showed severe aortic stenosis with a peak velocity across the aortic valve of 396, mean gradient of 41, and peak gradient of 65. The patient returned one year later to see Dr. Peter [name]. CT scan of the chest was done to measure the size of his aorta and a new left lower lobe lung nodule was noted. The patient was referred to thoracic surgery for workup of the lung nodule. The patient has had pulmonary function studies done . He does have a history of adult onset asthma , for which he uses bronchodilators intermittently and has for 25 or 30 years. Currently, he is on albuterol as a rescue inhaler he does note that his symptoms have been getting worse  and is using the inhaler 6 or 8 times a day. In addition, he notes that he wakes up at night short of breath and wheezing.  The patient's never been a smoker, retired from [employer], no history of asbestos exposure.  The patient denies syncope denies angina denies presyncope, he does wake up at night wheezing, he does note shortness of breath with exertion. He denies diabetes does have hypertension and takes a statin for lipid treatment. A needle/core biopsy was done of the left lower lobe pulmonary nodule. Pathology revealed no evidence of malignancy but granulomatous inflammation was seen. Dr. [name] then discussed the need for a median sternotomy with probable axillary cannulation(and hypothermic circulatory arrest)  for aortic valve replacement as well as replacement of the ascending thoracic aortic aneurysm. Potential risks, benefits, and complications were discussed and the patient agreed to proceed with surgery. Pre operative carotid duplex US showed no significant internal carotid artery stenosis bilaterally. He underwent an AVR and  Replacement of ascending thoracic aorta on 07/02/2014.  Brief Hospital Course:  The patient was extubated late the evening of surgery without difficulty. He remained afebrile and hemodynamically stable. Gordy Councilman, a line, chest tubes, and foley were removed early in the post operative course. Lopressor was started and titrated accordingly. He was found to have thrombocytopenia post op. Platelet went as low as 62,000. He was not given Lovenox or heparin like products. Last platelet count was up to 161,000. He also had ABL anemia. He did not require a  post op transfusion. His last H and H was 8.1 and 23.9.He was volume over loaded and diuresed. He was weaned off the insulin drip. The patient's HGA1C pre op was  5.7. The patient was felt surgically stable for transfer from the ICU to [facility]U for further convalescence on 07/05/2013.He went into a fib with RVR. He  was placed on an Amiodarone drip. He converted to sinus rhythm and remained in sinus rhythm. He continues to progress with cardiac rehab. He was ambulating on room air. He has been tolerating a diet and has had a bowel movement. Epicardial pacing wires and chest tube sutures will be removed prior to discharge. Provided the patient remains afebrile, hemodynamically stable, and pending morning round evaluation, he will be surgically stable for discharge on 07/08/2013.   Latest Vital Signs: Blood pressure 116/74, pulse 110, temperature 98.4 F (36.9 C), temperature source Oral, resp. rate 20, height 6' (1.829 m), weight 212 lb 3.2 oz (96.253 kg), SpO2 94.00%.  Physical Exam: General appearance: alert and cooperative  Neurologic: intact  Heart: regular rate and rhythm, S1, S2 normal, no murmur, click, rub or gallop  Lungs: diminished breath sounds bibasilar  Abdomen: soft, non-tender; bowel sounds normal; no masses, no organomegaly  Extremities: extremities normal, atraumatic, no cyanosis or edema and Homans sign is negative, no sign of DVT  Wound: sternum stable   Discharge Condition:Stable  Recent laboratory studies:  Lab Results  Component Value Date   WBC 11.1* 07/08/2014   HGB 8.1* 07/08/2014   HCT 23.9* 07/08/2014   MCV 93.4 07/08/2014   PLT 161 07/08/2014   Lab Results  Component Value Date   NA 134* 07/05/2014   K 4.5 07/05/2014   CL 99 07/05/2014   CO2 24 07/05/2014   CREATININE 0.94 07/05/2014   GLUCOSE 146* 07/05/2014      Diagnostic Studies: Dg Chest 2 View  07/05/2014   CLINICAL DATA:  Aortic valve replacement  EXAM: CHEST  2 VIEW  COMPARISON:  07/04/2014  FINDINGS: There are low lung volumes. There is bibasilar atelectasis. There is no focal consolidation. There is in a small left pleural effusion. There is no evidence of pulmonary edema. The heart and mediastinal contours are unremarkable. There is evidence of prior median sternotomy and aortic valve replacement.The  osseous structures are unremarkable.  IMPRESSION: Trace left pleural effusion.  No evidence of pulmonary edema.   Electronically Signed   By: Kathreen Devoid   On: 07/05/2014 08:19   Discharge Medications:   Medication List    STOP taking these medications       amLODipine 5 MG tablet  Commonly known as:  NORVASC     multivitamin capsule     simvastatin 20 MG tablet  Commonly known as:  ZOCOR      TAKE these medications       albuterol 108 (90 BASE) MCG/ACT inhaler  Commonly known as:  VENTOLIN HFA  Inhale 2 puffs into the lungs every 6 (six) hours as needed for wheezing or shortness of breath.     amiodarone 200 MG tablet  Commonly known as:  PACERONE  Take 2 tablets (400 mg total) by mouth 2 (two) times daily. For 5 days then take Amiodarone 200 mg by mouth two times daily thereafter.     aspirin 325 MG EC tablet  Take 1 tablet (325 mg total) by mouth daily.     atorvastatin 10 MG tablet  Commonly known as:  LIPITOR  Take 1 tablet (10 mg  total) by mouth daily at 6 PM.     BREO ELLIPTA 100-25 MCG/INH Aepb  Generic drug:  Fluticasone Furoate-Vilanterol  Inhale 1 puff into the lungs daily.     brinzolamide 1 % ophthalmic suspension  Commonly known as:  AZOPT  Place 1 drop into both eyes 2 (two) times daily.     cholecalciferol 1000 UNITS tablet  Commonly known as:  VITAMIN D  Take 1,000 Units by mouth daily.     ferrous sulfate 325 (65 FE) MG tablet  Take 1 tablet (325 mg total) by mouth daily with breakfast. For one month then stop.     folic acid 1 MG tablet  Commonly known as:  FOLVITE  Take 1 tablet (1 mg total) by mouth daily. For one month then stop.     furosemide 40 MG tablet  Commonly known as:  LASIX  Take 1 tablet (40 mg total) by mouth daily. For 5 days then stop.     lisinopril 2.5 MG tablet  Commonly known as:  PRINIVIL,ZESTRIL  Take 1 tablet (2.5 mg total) by mouth daily.     MELATONIN PO  Take 6 mg by mouth at bedtime.     metoprolol  tartrate 25 MG tablet  Commonly known as:  LOPRESSOR  Take 1 tablet (25 mg total) by mouth 2 (two) times daily.     potassium chloride SA 20 MEQ tablet  Commonly known as:  K-DUR,KLOR-CON  Take 1 tablet (20 mEq total) by mouth daily. For 5 days then stop.     traMADol 50 MG tablet  Commonly known as:  ULTRAM  Take 1-2 tablets (50-100 mg total) by mouth every 6 (six) hours as needed for moderate pain.     vitamin C 500 MG tablet  Commonly known as:  ASCORBIC ACID  Take 500 mg by mouth 2 (two) times daily.        The patient has been discharged on:   1.Beta Blocker:  Yes [x   ]                              No   [   ]                              If No, reason:  2.Ace Inhibitor/ARB: Yes [ x  ]                                     No  [    ]                                     If No, reason:  3.Statin:   Yes [x   ]                  No  [   ]                  If No, reason:  4.Ecasa:  Yes  [  x ]                  No   [   ]  If No, reason:  Follow Up Appointments: Follow-up Information   Follow up with Peter [name], MD On 07/23/2014. (Follow up appointment is at 3:30 pm)    Specialty:  Cardiology   Contact information:   [address] [place] [address] [phone]       Follow up with Grace Isaac, MD On 08/15/2014. (PA/LAT CXR to be taken (at [facility] which is in the same building as Dr. Everrett Coombe office) on 08/15/2014 at 11:30 am;Appointment with Dr. [name] is at 12:30 pm)    Specialty:  Cardiothoracic Surgery   Contact information:   [address] [place] [address] [phone]       Follow up with Peter [name], MD. (The office will call.)    Specialty:  Cardiology   Contact information:   [address] [place] [address] [phone]       Signed: Lars Pinks MPA-C 07/08/2014, 2:13 PM

## 2014-07-05 NOTE — Progress Notes (Signed)
Patient ID: Jeremy Meadows, male   DOB: Jul 03, 1946, 68 y.o.   MRN: [id number] TCTS DAILY ICU PROGRESS NOTE                   [address]            [place] [address]          [id number]   3 Days Post-Op Procedure(s) (LRB): AORTIC VALVE REPLACEMENT (AVR) with 23 Aortic Magna Ease (N/A) supra coronary  ASCENDING AORTIC REPLACEMENT to the Inominate Artery with 21mm Hemashield Platinum with  circulatory arrest (N/A) INTRAOPERATIVE TRANSESOPHAGEAL ECHOCARDIOGRAM (N/A)  Total Length of Stay:  LOS: 3 days   Subjective: Mild sob after walking , just back from xray  Objective: Vital signs in last 24 hours: Temp:  [98.3 F (36.8 C)-98.8 F (37.1 C)] 98.7 F (37.1 C) (10/09 0400) Pulse Rate:  [25-107] 86 (10/09 0700) Cardiac Rhythm:  [-] Normal sinus rhythm (10/09 0400) Resp:  [7-27] 7 (10/09 0700) BP: (88-127)/(49-79) 106/78 mmHg (10/09 0700) SpO2:  [89 %-98 %] 96 % (10/09 0740) Weight:  [223 lb 15.8 oz (101.6 kg)] 223 lb 15.8 oz (101.6 kg) (10/09 0314)  Filed Weights   07/03/14 0341 07/04/14 0500 07/05/14 0314  Weight: 222 lb 3.6 oz (100.8 kg) 224 lb 13.9 oz (102 kg) 223 lb 15.8 oz (101.6 kg)    Weight change: -14.1 oz (-0.4 kg)   Hemodynamic parameters for last 24 hours:    Intake/Output from previous day: 10/08 0701 - 10/09 0700 In: 1225.2 [P.O.:480; I.V.:195.2; IV Piggyback:550] Out: 901 [Urine:900; Stool:1]  Intake/Output this shift:    Current Meds: Scheduled Meds: . aspirin EC  325 mg Oral Daily   Or  . aspirin  324 mg Per Tube Daily  . brinzolamide  1 drop Both Eyes BID  . budesonide-formoterol  2 puff Inhalation BID  . docusate sodium  200 mg Oral Daily  . insulin aspart  0-24 Units Subcutaneous TID AC & HS  . levalbuterol  0.63 mg Nebulization Q6H  . metoprolol tartrate  12.5 mg Oral BID  . moving right along book   Does not apply Once  . pantoprazole  40 mg Oral QAC breakfast  . simvastatin  20 mg Oral q1800  . sodium chloride  3  mL Intravenous Q12H   Continuous Infusions:  PRN Meds:.sodium chloride, bisacodyl, bisacodyl, ondansetron (ZOFRAN) IV, ondansetron, oxyCODONE, promethazine, sodium chloride, traMADol  General appearance: alert and cooperative Neurologic: intact Heart: regular rate and rhythm, S1, S2 normal, no murmur, click, rub or gallop Lungs: diminished breath sounds bibasilar Abdomen: soft, non-tender; bowel sounds normal; no masses,  no organomegaly Extremities: extremities normal, atraumatic, no cyanosis or edema and Homans sign is negative, no sign of DVT Wound: sternum stable  Lab Results: CBC: Recent Labs  07/03/14 1637 07/03/14 1715 07/04/14 0400  WBC 19.2*  --  16.0*  HGB 9.9* 8.8* 8.7*  HCT 28.5* 26.0* 25.5*  PLT 75*  --  62*   BMET:  Recent Labs  07/03/14 0358  07/03/14 1715 07/04/14 0400  NA 138  --  135* 136*  K 4.2  --  4.5 4.6  CL 102  --  103 102  CO2 21  --   --  23  GLUCOSE 126*  --  173* 138*  BUN 16  --  22 29*  CREATININE 0.99  < > 1.20 1.09  CALCIUM 6.9*  --   --  7.3*  < > = values  in this interval not displayed.  PT/INR:  Recent Labs  07/02/14 1630  LABPROT 22.7*  INR 2.00*   Radiology: Dg Chest Port 1 View  07/04/2014   CLINICAL DATA:  Followup for cardiac surgery, chest soreness today  EXAM: PORTABLE CHEST - 1 VIEW  COMPARISON:  07/03/2014  FINDINGS: Moderate to severe cardiac enlargement. Replacement cardiac valve noted. Swan-Ganz has been removed with vascular sheath in its place extending into superior vena cava. Left chest tube has been removed.  Very limited inspiratory effect. There is vascular congestion. Hazy density at the right base suggesting small effusion and atelectasis. Left basilar opacity also suggests lower lobe consolidation on the left. Bibasilar opacities are worse when compared to the prior study.  IMPRESSION: Decreased inspiratory effect when compared to prior study with bibasilar opacities suggesting consolidation possibly  atelectasis. Also, small pleural effusions suspected.   Electronically Signed   By: Skipper Cliche M.D.   On: 07/04/2014 07:48     Assessment/Plan: S/P Procedure(s) (LRB): AORTIC VALVE REPLACEMENT (AVR) with 23 Aortic Magna Ease (N/A) supra coronary  ASCENDING AORTIC REPLACEMENT to the Inominate Artery with 27mm Hemashield Platinum with  circulatory arrest (N/A) INTRAOPERATIVE TRANSESOPHAGEAL ECHOCARDIOGRAM (N/A) Mobilize Diuresis Plan for transfer to step-down: see transfer orders Waiting for stepdown bed Labs today still pending Follow up on plt count    Darianny Momon B 07/05/2014 7:50 AM

## 2014-07-06 LAB — TYPE AND SCREEN
ABO/RH(D): O POS
Antibody Screen: NEGATIVE
Unit division: 0
Unit division: 0
Unit division: 0
Unit division: 0

## 2014-07-06 LAB — GLUCOSE, CAPILLARY: Glucose-Capillary: 114 mg/dL — ABNORMAL HIGH (ref 70–99)

## 2014-07-06 MED ORDER — METOPROLOL TARTRATE 25 MG PO TABS
25.0000 mg | ORAL_TABLET | Freq: Two times a day (BID) | ORAL | Status: DC
  Administered 2014-07-06 – 2014-07-08 (×5): 25 mg via ORAL
  Filled 2014-07-06 (×6): qty 1

## 2014-07-06 MED ORDER — LEVALBUTEROL HCL 0.63 MG/3ML IN NEBU: 0.6300 mg | INHALATION_SOLUTION | Freq: Four times a day (QID) | RESPIRATORY_TRACT | Status: DC | PRN

## 2014-07-06 MED ORDER — FOLIC ACID 1 MG PO TABS
1.0000 mg | ORAL_TABLET | Freq: Every day | ORAL | Status: DC
  Administered 2014-07-06 – 2014-07-08 (×3): 1 mg via ORAL
  Filled 2014-07-06 (×3): qty 1

## 2014-07-06 MED ORDER — FERROUS SULFATE 325 (65 FE) MG PO TABS
325.0000 mg | ORAL_TABLET | Freq: Every day | ORAL | Status: DC
  Administered 2014-07-06 – 2014-07-08 (×3): 325 mg via ORAL
  Filled 2014-07-06 (×4): qty 1

## 2014-07-06 NOTE — Progress Notes (Signed)
CARDIAC REHAB PHASE I   PRE:  Rate/Rhythm: 83 sinus   BP:  Supine:   Sitting: 104/64  Standing:    SaO2: 93% ra  MODE:  Ambulation: 350 ft   POST:  Rate/Rhythem: 99 sinus rhythm frequent PAC  BP:  Supine:   Sitting: 110/70  Standing:    SaO2: 97% ra  1130-1150  Pt ambulated in hallway x1 assist using rolling walker.  Pt took several standing rest breaks, c/o fatigue.  Pt reports surgical discomfort somewhat improved, rates 5/10.  Pt returned to chair, call light in reach.  Pt encouraged to use incentive spirometry and pulmonary hygiene.  Understanding verbalized.    Jeremy Meadows, Jeremy Meadows

## 2014-07-06 NOTE — Progress Notes (Signed)
Jeremy Meadows ambulated 275 feet using a front wheel walker. Pt tolerated well, up in chair with feet elevated resting post ambulation.

## 2014-07-06 NOTE — Progress Notes (Signed)
  Amiodarone Drug - Drug Interaction Consult Note  Recommendations:  Amiodarone is metabolized by the cytochrome P450 system and therefore has the potential to cause many drug interactions. Amiodarone has an average plasma half-life of 50 days (range 20 to 100 days).   There is potential for drug interactions to occur several weeks or months after stopping treatment and the onset of drug interactions may be slow after initiating amiodarone.   [x]  Statins: Increased risk of myopathy. Simvastatin- restrict dose to 20mg  daily. Other statins: counsel patients to report any muscle pain or weakness immediately.  []  Anticoagulants: Amiodarone can increase anticoagulant effect. Consider warfarin dose reduction. Patients should be monitored closely and the dose of anticoagulant altered accordingly, remembering that amiodarone levels take several weeks to stabilize.  []  Antiepileptics: Amiodarone can increase plasma concentration of phenytoin, the dose should be reduced. Note that small changes in phenytoin dose can result in large changes in levels. Monitor patient and counsel on signs of toxicity.  [x]  Beta blockers: increased risk of bradycardia, AV block and myocardial depression. Sotalol - avoid concomitant use.  []   Calcium channel blockers (diltiazem and verapamil): increased risk of bradycardia, AV block and myocardial depression.  []   Cyclosporine: Amiodarone increases levels of cyclosporine. Reduced dose of cyclosporine is recommended.  []  Digoxin dose should be halved when amiodarone is started.  [x]  Diuretics: increased risk of cardiotoxicity if hypokalemia occurs.  []  Oral hypoglycemic agents (glyburide, glipizide, glimepiride): increased risk of hypoglycemia. Patient's glucose levels should be monitored closely when initiating amiodarone therapy.   []  Drugs that prolong the QT interval:  Torsades de pointes risk may be increased with concurrent use - avoid if possible.  Monitor QTc,  also keep magnesium/potassium WNL if concurrent therapy can't be avoided. [name] Antibiotics: e.g. fluoroquinolones, erythromycin. . Antiarrhythmics: e.g. quinidine, procainamide, disopyramide, sotalol. . Antipsychotics: e.g. phenothiazines, haloperidol.  . Lithium, tricyclic antidepressants, and methadone. Thank You,  Jeremy Meadows  07/06/2014 11:20 AM  Jeremy Meadows, PharmD, [place] Clinical Staff Pharmacist Pager [phone]

## 2014-07-06 NOTE — Progress Notes (Signed)
PHASE I Cardiac Rehab  Pt refused ambulation at this time. Pt c/o surgical discomfort and states he was given pain medication 30 minutes ago.  RN aware. Pt would like to ambulate later this morning.  Pt OOB in chair.  Importance of progressive ambulation explained to pt.  Understanding verbalized

## 2014-07-06 NOTE — Progress Notes (Signed)
Jeremy Meadows ambulated in hall with rolling walker 400 ft with one standing rest break. He tolerated the activity with mild shortness of breath at end of hall; he recovered quickly. Max HR during walk- 102.

## 2014-07-06 NOTE — Progress Notes (Addendum)
      [address]       [place] [address]             [phone]        4 Days Post-Op Procedure(s) (LRB): AORTIC VALVE REPLACEMENT (AVR) with 23 Aortic Magna Ease (N/A) supra coronary  ASCENDING AORTIC REPLACEMENT to the Inominate Artery with 74mm Hemashield Platinum with  circulatory arrest (N/A) INTRAOPERATIVE TRANSESOPHAGEAL ECHOCARDIOGRAM (N/A)  Subjective: Patient sitting in chair. No real complaints this am.  Objective: Vital signs in last 24 hours: Temp:  [97.9 F (36.6 C)-99 F (37.2 C)] 98 F (36.7 C) (10/10 0626) Pulse Rate:  [90-149] 91 (10/10 0626) Cardiac Rhythm:  [-] Atrial fibrillation (10/09 1950) Resp:  [12-19] 18 (10/10 0626) BP: (102-133)/(62-81) 105/62 mmHg (10/10 0626) SpO2:  [91 %-100 %] 94 % (10/10 0626) Weight:  [215 lb 14.4 oz (97.932 kg)] 215 lb 14.4 oz (97.932 kg) (10/10 0626)  Pre op weight 91 kg Current Weight  07/06/14 215 lb 14.4 oz (97.932 kg)       Intake/Output from previous day: 10/09 0701 - 10/10 0700 In: 840 [P.O.:840] Out: 1500 [Urine:1500]   Physical Exam:  Cardiovascular: RRR Pulmonary: Diminished at bases bilaterally; no rales, wheezes, or rhonchi. Abdomen: Soft, non tender, bowel sounds present. Extremities: Mild bilateral lower extremity edema. Wounds: Clean and dry.  No erythema or signs of infection.  Lab Results: CBC: Recent Labs  07/04/14 0400 07/05/14 0935  WBC 16.0* 7.3  HGB 8.7* 8.6*  HCT 25.5* 25.2*  PLT 62* 77*   BMET:  Recent Labs  07/04/14 0400 07/05/14 0935  NA 136* 134*  K 4.6 4.5  CL 102 99  CO2 23 24  GLUCOSE 138* 146*  BUN 29* 29*  CREATININE 1.09 0.94  CALCIUM 7.3* 8.2*    PT/INR:  Lab Results  Component Value Date   INR 2.00* 07/02/2014   INR 0.91 06/28/2014   INR 0.90 05/20/2014   ABG:  INR: Will add last result for INR, ABG once components are confirmed Will add last 4 CBG results once components are confirmed  Assessment/Plan:  1. CV - Went into  a fib with RVR last evening. SR in the low 90's. On Amiodarone drip and Lopressor 12.5 bid. Will increase to 25 bid. If maintains SR, will change Amiodarone to oral. 2.  Pulmonary - Encourage incentive spirometer 3. Volume Overload - On Lasix 40 daily 4.  Acute blood loss anemia - H and H stable at 8.6 and 25.2. Start Ferrous and folic acid. 5. Thrombocytopenia-platelets up to 77,000 6. CBGs 140/122/114. Pre op HGA1C 5.7. Stop accu checks and SS PRN 7. Continue CRPI  ZIMMERMAN,DONIELLE MPA-C 07/06/2014,7:42 AM  Not feeling well- headache and overall soreness Transient rapid a fib last night Currently in SR

## 2014-07-07 MED ORDER — AMIODARONE HCL 200 MG PO TABS
400.0000 mg | ORAL_TABLET | Freq: Two times a day (BID) | ORAL | Status: DC
  Administered 2014-07-07 – 2014-07-08 (×3): 400 mg via ORAL
  Filled 2014-07-07 (×4): qty 2

## 2014-07-07 MED ORDER — POTASSIUM CHLORIDE CRYS ER 20 MEQ PO TBCR
20.0000 meq | EXTENDED_RELEASE_TABLET | Freq: Every day | ORAL | Status: AC
  Administered 2014-07-08: 20 meq via ORAL
  Filled 2014-07-07: qty 1

## 2014-07-07 MED ORDER — ATORVASTATIN CALCIUM 20 MG PO TABS: 20.0000 mg | ORAL_TABLET | Freq: Every day | ORAL | Status: DC

## 2014-07-07 MED ORDER — ATORVASTATIN CALCIUM 10 MG PO TABS
10.0000 mg | ORAL_TABLET | Freq: Every day | ORAL | Status: DC
  Administered 2014-07-07: 10 mg via ORAL
  Filled 2014-07-07 (×2): qty 1

## 2014-07-07 NOTE — Progress Notes (Addendum)
      [address]       [place] [address]             [phone]        5 Days Post-Op Procedure(s) (LRB): AORTIC VALVE REPLACEMENT (AVR) with 23 Aortic Magna Ease (N/A) supra coronary  ASCENDING AORTIC REPLACEMENT to the Inominate Artery with 43mm Hemashield Platinum with  circulatory arrest (N/A) INTRAOPERATIVE TRANSESOPHAGEAL ECHOCARDIOGRAM (N/A)  Subjective: Patient eating breakfast. Has incisoinal pain (axillary and sternal wounds)  Objective: Vital signs in last 24 hours: Temp:  [98.2 F (36.8 C)-98.8 F (37.1 C)] 98.8 F (37.1 C) (10/11 0437) Pulse Rate:  [83-97] 87 (10/11 0437) Cardiac Rhythm:  [-] Normal sinus rhythm (10/11 0020) Resp:  [20] 20 (10/11 0437) BP: (95-120)/(61-73) 107/73 mmHg (10/11 0437) SpO2:  [90 %-94 %] 90 % (10/11 0437) Weight:  [214 lb 15.2 oz (97.5 kg)] 214 lb 15.2 oz (97.5 kg) (10/11 0437)  Pre op weight 91 kg Current Weight  07/07/14 214 lb 15.2 oz (97.5 kg)       Intake/Output from previous day: 10/10 0701 - 10/11 0700 In: 1362.4 [P.O.:720; I.V.:642.4] Out: 1000 [Urine:1000]   Physical Exam:  Cardiovascular: RRR Pulmonary: Slightly diminished at bases bilaterally; no rales, wheezes, or rhonchi. Abdomen: Soft, non tender, bowel sounds present. Extremities: Mild bilateral lower extremity edema. Wounds: Clean and dry.  No erythema or signs of infection.  Lab Results: CBC:  Recent Labs  07/05/14 0935  WBC 7.3  HGB 8.6*  HCT 25.2*  PLT 77*   BMET:   Recent Labs  07/05/14 0935  NA 134*  K 4.5  CL 99  CO2 24  GLUCOSE 146*  BUN 29*  CREATININE 0.94  CALCIUM 8.2*    PT/INR:  Lab Results  Component Value Date   INR 2.00* 07/02/2014   INR 0.91 06/28/2014   INR 0.90 05/20/2014   ABG:  INR: Will add last result for INR, ABG once components are confirmed Will add last 4 CBG results once components are confirmed  Assessment/Plan:  1. CV - Went into a fib with RVR last evening. SR in the low  90's. On Amiodarone drip and Lopressor 25 bid. Will start oral Amiodarone and stop drip. 2.  Pulmonary - Encourage incentive spirometer 3. Volume Overload - On Lasix 40 daily 4.  Acute blood loss anemia - H and H stable at 8.6 and 25.2. Start Ferrous and folic acid. 5. Thrombocytopenia-platelets up to 77,000 6. Stop Zocor and change to Lipitor as is on Amiodarone 7. Remove EPW in am 8. Possible home in 1-2 days if maintains SR  ZIMMERMAN,DONIELLE MPA-C 07/07/2014,7:51 AM  Patient seen and examined, agree with above Change to PO amiodarone

## 2014-07-07 NOTE — Progress Notes (Signed)
Pt ambulated 350 ft with front wheel walker; patient tolerated walk well; back in chair; call bell within reach; will continue to monitor.  Rowe Pavy, RN

## 2014-07-07 NOTE — Progress Notes (Signed)
Jeremy Meadows ambulated three times during the day. He ambulated 350 feet each time. States "he feels that it is getting easier". Tolerated the walks well, currently resting in chair with feet elevated.  Candice Camp RN

## 2014-07-08 LAB — CBC
HCT: 23.9 % — ABNORMAL LOW (ref 39.0–52.0)
Hemoglobin: 8.1 g/dL — ABNORMAL LOW (ref 13.0–17.0)
MCH: 31.6 pg (ref 26.0–34.0)
MCHC: 33.9 g/dL (ref 30.0–36.0)
MCV: 93.4 fL (ref 78.0–100.0)
Platelets: 161 10*3/uL (ref 150–400)
RBC: 2.56 MIL/uL — ABNORMAL LOW (ref 4.22–5.81)
RDW: 14.4 % (ref 11.5–15.5)
WBC: 11.1 10*3/uL — AB (ref 4.0–10.5)

## 2014-07-08 MED ORDER — ATORVASTATIN CALCIUM 10 MG PO TABS: 10.0000 mg | ORAL_TABLET | Freq: Every day | ORAL | Status: DC

## 2014-07-08 MED ORDER — METOPROLOL TARTRATE 25 MG PO TABS: 25.0000 mg | ORAL_TABLET | Freq: Two times a day (BID) | ORAL | Status: DC

## 2014-07-08 MED ORDER — AMIODARONE HCL 200 MG PO TABS: 400.0000 mg | ORAL_TABLET | Freq: Two times a day (BID) | ORAL | Status: DC

## 2014-07-08 MED ORDER — OXYCODONE HCL 5 MG PO TABS: 5.0000 mg | ORAL_TABLET | ORAL | Status: DC | PRN

## 2014-07-08 MED ORDER — FUROSEMIDE 40 MG PO TABS: 40.0000 mg | ORAL_TABLET | Freq: Every day | ORAL | Status: DC

## 2014-07-08 MED ORDER — MENTHOL 3 MG MT LOZG
1.0000 | LOZENGE | OROMUCOSAL | Status: DC | PRN
  Administered 2014-07-08: 3 mg via ORAL
  Filled 2014-07-08: qty 9

## 2014-07-08 MED ORDER — ASPIRIN 325 MG PO TBEC: 325.0000 mg | DELAYED_RELEASE_TABLET | Freq: Every day | ORAL | Status: DC

## 2014-07-08 MED ORDER — FERROUS SULFATE 325 (65 FE) MG PO TABS: 325.0000 mg | ORAL_TABLET | Freq: Every day | ORAL | Status: DC

## 2014-07-08 MED ORDER — FUROSEMIDE 40 MG PO TABS
40.0000 mg | ORAL_TABLET | Freq: Every day | ORAL | Status: DC
  Administered 2014-07-08: 40 mg via ORAL
  Filled 2014-07-08: qty 1

## 2014-07-08 MED ORDER — AMIODARONE HCL 200 MG PO TABS: 200.0000 mg | ORAL_TABLET | Freq: Two times a day (BID) | ORAL | Status: DC

## 2014-07-08 MED ORDER — LISINOPRIL 2.5 MG PO TABS: 2.5000 mg | ORAL_TABLET | Freq: Every day | ORAL | Status: DC

## 2014-07-08 MED ORDER — PHENOL 1.4 % MT LIQD
1.0000 | OROMUCOSAL | Status: DC | PRN
  Filled 2014-07-08: qty 177

## 2014-07-08 MED ORDER — LISINOPRIL 2.5 MG PO TABS
2.5000 mg | ORAL_TABLET | Freq: Every day | ORAL | Status: DC
  Administered 2014-07-08: 2.5 mg via ORAL
  Filled 2014-07-08: qty 1

## 2014-07-08 MED ORDER — FOLIC ACID 1 MG PO TABS: 1.0000 mg | ORAL_TABLET | Freq: Every day | ORAL | Status: DC

## 2014-07-08 MED ORDER — POTASSIUM CHLORIDE CRYS ER 20 MEQ PO TBCR: 20.0000 meq | EXTENDED_RELEASE_TABLET | Freq: Every day | ORAL | Status: DC

## 2014-07-08 MED ORDER — TRAMADOL HCL 50 MG PO TABS: 50.0000 mg | ORAL_TABLET | Freq: Four times a day (QID) | ORAL | Status: DC | PRN

## 2014-07-08 NOTE — Progress Notes (Signed)
Assessment unchanged. Discussed D/C instructions with pt and family including f/u appointments and new medications. Verbalized understanding. RX given to pt. IV and tele removed. CT sutures removed. Benzoin and 1/2" steri strips applied. Pt instructed to let strips fall off on their own.  Verbalized understanding.  Pt left with belongings accompanied by [facility].

## 2014-07-08 NOTE — Progress Notes (Signed)
      [address]       [place], [address]             [phone]        6 Days Post-Op Procedure(s) (LRB): AORTIC VALVE REPLACEMENT (AVR) with 23 Aortic Magna Ease (N/A) supra coronary  ASCENDING AORTIC REPLACEMENT to the Inominate Artery with 3mm Hemashield Platinum with  circulatory arrest (N/A) INTRAOPERATIVE TRANSESOPHAGEAL ECHOCARDIOGRAM (N/A)  Subjective: Patient eating breakfast. Still with some incisoinal pain (axillary and sternal wounds) and sore throat.  Objective: Vital signs in last 24 hours: Temp:  [98.4 F (36.9 C)-98.8 F (37.1 C)] 98.4 F (36.9 C) (10/12 0539) Pulse Rate:  [86-94] 91 (10/12 0539) Cardiac Rhythm:  [-] Normal sinus rhythm (10/11 2141) Resp:  [20] 20 (10/12 0539) BP: (117-136)/(75-77) 136/77 mmHg (10/12 0539) SpO2:  [92 %-94 %] 94 % (10/12 0539) Weight:  [212 lb 3.2 oz (96.253 kg)] 212 lb 3.2 oz (96.253 kg) (10/12 0539)  Pre op weight 91 kg Current Weight  07/08/14 212 lb 3.2 oz (96.253 kg)       Intake/Output from previous day: 10/11 0701 - 10/12 0700 In: 363 [P.O.:360; I.V.:3] Out: 1450 [Urine:1450]   Physical Exam:  Cardiovascular: RRR Pulmonary: Slightly diminished at bases bilaterally; no rales, wheezes, or rhonchi. Abdomen: Soft, non tender, bowel sounds present. Extremities: Mild bilateral lower extremity edema. Wounds: Clean and dry.  No erythema or signs of infection.  Lab Results: CBC:  Recent Labs  07/05/14 0935 07/08/14 0535  WBC 7.3 11.1*  HGB 8.6* 8.1*  HCT 25.2* 23.9*  PLT 77* 161   BMET:   Recent Labs  07/05/14 0935  NA 134*  K 4.5  CL 99  CO2 24  GLUCOSE 146*  BUN 29*  CREATININE 0.94  CALCIUM 8.2*    PT/INR:  Lab Results  Component Value Date   INR 2.00* 07/02/2014   INR 0.91 06/28/2014   INR 0.90 05/20/2014   ABG:  INR: Will add last result for INR, ABG once components are confirmed Will add last 4 CBG results once components are  confirmed  Assessment/Plan:  1. CV - Went into a fib with RVR over weekend. ST in the low 100's this am. On Amiodarone 400 bid and Lopressor 25 bid. BP improved so will start low dose ACE. 2.  Pulmonary - Encourage incentive spirometer 3. Volume Overload - On Lasix 40 daily 4.  Acute blood loss anemia - H and H stable at 8.1 and 23.9 Start Ferrous and folic acid. 5. Thrombocytopenia resolved-platelets up to 161,000 6. Remove EPW  7. Throat lozenges, chloraseptic PRN sore throat 8. Possible home later today vs am  Jacquelina Hewins MPA-C 07/08/2014,7:26 AM

## 2014-07-08 NOTE — Progress Notes (Signed)
1032-1104 Education completed with pt and wife who voiced understanding. Encouraged IS and gave walking guidelines. Gave heart healthy diet for information. Encouraged pt to watch sodium. Discussed CRP 2 and pt wanted referral to [facility] so that insurance coverage could be checked. Will refer to both. Brochures given. Put on discharge video for them to view. Graylon Good RN BSN 07/08/2014 11:05 AM

## 2014-07-08 NOTE — Progress Notes (Signed)
CARDIAC REHAB PHASE I   PRE:  Rate/Rhythm: 95 SR  BP:  Supine:   Sitting: 128/68  Standing:    SaO2: 95%RA  MODE:  Ambulation: 550 ft   POST:  Rate/Rhythm: 116 ST  BP:  Supine:   Sitting: 153/80  Standing:    SaO2: 90-96%RA 0815-0840 Pt walked 550 ft with steady gait holding to pillow. Did not need rolling walker. Stopped once for long break at about 300 ft. Some DOE after walk. Sat on side of bed few minutes to catch his breath before lying down. Received pain med prior to walk. Will educate when wife here.    Graylon Good, RN BSN  07/08/2014 8:38 AM

## 2014-07-08 NOTE — Progress Notes (Signed)
EPW removed per order. Ends intact. VSS. Pt instructed of one hour bedrest. Verbalized understanding. Will continue to monitor pt cloesly. 

## 2014-07-09 NOTE — Care Management Note (Signed)
    Page 1 of 1   07/09/2014     4:25:22 PM CARE MANAGEMENT NOTE 07/09/2014  Patient:  Jeremy Meadows, Jeremy Meadows   Account Number:  [id number]  Date Initiated:  07/08/2014  Documentation initiated by:  Herald Vallin  Subjective/Objective Assessment:   Pt s/p AVR and aortic root replacement.  PTA, pt independent, lives with spouse.     Action/Plan:   Planning dc later; no dc needs identified.   Anticipated DC Date:  07/09/2014   Anticipated DC Plan:  [facility]  CM consult      Choice offered to / List presented to:             Status of service:  Completed, signed off Medicare Important Message given?  YES (If response is "NO", the following Medicare IM given date fields will be blank) Date Medicare IM given:  07/08/2014 Medicare IM given by:  Brooklen Runquist Date Additional Medicare IM given:   Additional Medicare IM given by:    Discharge Disposition:  HOME/SELF CARE  Per UR Regulation:  Reviewed for med. necessity/level of care/duration of stay  If discussed at [facility] of Stay Meetings, dates discussed:   07/09/2014    Comments:

## 2014-07-23 ENCOUNTER — Ambulatory Visit (INDEPENDENT_AMBULATORY_CARE_PROVIDER_SITE_OTHER): Payer: Commercial Managed Care - HMO | Admitting: Cardiology

## 2014-07-23 ENCOUNTER — Encounter: Payer: Self-pay | Admitting: Cardiology

## 2014-07-23 VITALS — BP 110/70 | HR 84 | Ht 72.0 in | Wt 192.9 lb

## 2014-07-23 DIAGNOSIS — Z954 Presence of other heart-valve replacement: Secondary | ICD-10-CM

## 2014-07-23 DIAGNOSIS — I350 Nonrheumatic aortic (valve) stenosis: Secondary | ICD-10-CM

## 2014-07-23 DIAGNOSIS — I712 Thoracic aortic aneurysm, without rupture: Secondary | ICD-10-CM

## 2014-07-23 DIAGNOSIS — I480 Paroxysmal atrial fibrillation: Secondary | ICD-10-CM

## 2014-07-23 DIAGNOSIS — I351 Nonrheumatic aortic (valve) insufficiency: Secondary | ICD-10-CM

## 2014-07-23 DIAGNOSIS — I4891 Unspecified atrial fibrillation: Secondary | ICD-10-CM

## 2014-07-23 DIAGNOSIS — I7121 Aneurysm of the ascending aorta, without rupture: Secondary | ICD-10-CM

## 2014-07-23 DIAGNOSIS — Z952 Presence of prosthetic heart valve: Secondary | ICD-10-CM

## 2014-07-23 HISTORY — DX: Unspecified atrial fibrillation: I48.91

## 2014-07-23 MED ORDER — AMIODARONE HCL 200 MG PO TABS: 200.0000 mg | ORAL_TABLET | Freq: Every day | ORAL | Status: DC

## 2014-07-23 NOTE — Patient Instructions (Signed)
Stop lisinopril  Reduce amiodarone to 200 mg daily  When your current bottle of iron runs out you may stop it  We will schedule you for an Echocardiogram.  Enroll in cardiac Rehab  I will see you in 2 months

## 2014-07-23 NOTE — Progress Notes (Signed)
I will  Jeremy Meadows Date of Birth: 1946/08/31 Medical Record #[id number]  History of Present Illness: Jeremy Meadows is seen for followup of aortic stenosis and  thoracic aortic aneurysm. He is s/p AVR and aortic aneurysm grafting by Dr. [name] on 07/02/14 with a #23 Magna Ease pericardial valve and 32 mm Hema shield graft. His post op course was complicated by atrial fibrillation that converted to NSR on amiodarone. On follow up today he is feeling well. He does complain of a dry hacking cough since DC. He was started on an ACEi. He had some constipation relieved with stool softeners. He notes some numbness in the lateral aspect of his hand and pinky on the left. No palpitations. Only mild incisional pain. No SOB.   Current Outpatient Prescriptions on File Prior to Visit  Medication Sig Dispense Refill  . albuterol (VENTOLIN HFA) 108 (90 BASE) MCG/ACT inhaler Inhale 2 puffs into the lungs every 6 (six) hours as needed for wheezing or shortness of breath.  1 Inhaler  0  . aspirin EC 325 MG EC tablet Take 1 tablet (325 mg total) by mouth daily.  30 tablet  0  . atorvastatin (LIPITOR) 10 MG tablet Take 1 tablet (10 mg total) by mouth daily at 6 PM.  30 tablet  1  . brinzolamide (AZOPT) 1 % ophthalmic suspension Place 1 drop into both eyes 2 (two) times daily.      . cholecalciferol (VITAMIN D) 1000 UNITS tablet Take 1,000 Units by mouth daily.      . ferrous sulfate 325 (65 FE) MG tablet Take 1 tablet (325 mg total) by mouth daily with breakfast. For one month then stop.    3  . Fluticasone Furoate-Vilanterol (BREO ELLIPTA) 100-25 MCG/INH AEPB Inhale 1 puff into the lungs daily.      . folic acid (FOLVITE) 1 MG tablet Take 1 tablet (1 mg total) by mouth daily. For one month then stop.      [name] MELATONIN PO Take 6 mg by mouth at bedtime.      . metoprolol tartrate (LOPRESSOR) 25 MG tablet Take 1 tablet (25 mg total) by mouth 2 (two) times daily.  60 tablet  1  . traMADol (ULTRAM) 50 MG tablet  Take 1-2 tablets (50-100 mg total) by mouth every 6 (six) hours as needed for moderate pain.  40 tablet  0  . vitamin C (ASCORBIC ACID) 500 MG tablet Take 500 mg by mouth 2 (two) times daily.       No current facility-administered medications on file prior to visit.    No Known Allergies  Past Medical History  Diagnosis Date  . Unspecified glaucoma   . Allergic rhinitis, cause unspecified   . Aortic valve stenosis   . HLD (hyperlipidemia)   . Benign neoplasm of colon   . History of nephrolithiasis   . Other testicular hypofunction   . Aortic insufficiency   . Aortic root enlargement     thoracic aorta  . Heart murmur 2014  . Pulmonary nodule, left 2015    2cm, ?sarcoid by biopsy  . Hypertension   . Obstructive sleep apnea (adult) (pediatric)     pt. doesn't use his machine at home.  . Asthma   . History of kidney stones   . Arthritis   . Atrial fibrillation 07/23/2014    Past Surgical History  Procedure Laterality Date  . Rhinoplasty  1980s  . Knee surgery Right 05/2012    arthroscopic  . Medial  partial knee replacement Right 03/2013  . Knee surgery Right 12/2013    patella  . Lithotripsy    . Lung biopsy Left 2015    by IR - benign  . Cataract extraction Bilateral   . Cardiac catheterization  04/2014    no significant obstructive CAD  . Joint replacement Right 14    partial replacement  . Aortic valve replacement N/A 07/02/2014    Procedure: AORTIC VALVE REPLACEMENT (AVR) with 23 Aortic Magna Ease;  Surgeon: Grace Isaac, MD;  Location: [facility];  Service: Open Heart Surgery;  Laterality: N/A;  . Ascending aortic root replacement N/A 07/02/2014    Procedure: supra coronary  ASCENDING AORTIC REPLACEMENT to the Inominate Artery with 81mm Hemashield Platinum with  circulatory arrest;  Surgeon: Grace Isaac, MD;  Location: [facility];  Service: Open Heart Surgery;  Laterality: N/A;  . Intraoperative transesophageal echocardiogram N/A 07/02/2014    Procedure:  INTRAOPERATIVE TRANSESOPHAGEAL ECHOCARDIOGRAM;  Surgeon: Grace Isaac, MD;  Location: [facility];  Service: Open Heart Surgery;  Laterality: N/A;    History  Smoking status  . Never Smoker   Smokeless tobacco  . Never Used    History  Alcohol Use No    Family History  Problem Relation Age of Onset  . CAD Father 49    MI  . Heart disease Brother   . Heart disease Brother   . Heart disease Brother   . Cancer Neg Hx     Review of Systems: As noted in history of present illness. All other systems were reviewed and are negative.  Physical Exam: BP 110/70  Pulse 84  Ht 6' (1.829 m)  Wt 192 lb 14.4 oz (87.499 kg)  BMI 26.16 kg/m2 He is a pleasant white male in no acute distress. HEENT: Normal Neck is without JVD, adenopathy, thyromegaly, or bruits. Carotid upstrokes are normal. Lungs: Clear Chest: median sternotomy and right subclavicular scars are healing well.  Cardiovascular: Regular rate and rhythm. Normal S1 and S2. There is a grade 1/6 systolic murmur at the left sternal border. PMI is normal. No gallop. Abdomen: Soft and nontender. No masses or bruits. No hepatosplenomegaly. Bowel sounds are positive. Extremities: No cyanosis or edema. Pulses are 2+ and symmetric. Neuro: Alert and oriented x3. Cranial nerves II through XII are intact.  LABORATORY DATA: Lab Results  Component Value Date   WBC 11.1* 07/08/2014   HGB 8.1* 07/08/2014   HCT 23.9* 07/08/2014   PLT 161 07/08/2014   GLUCOSE 146* 07/05/2014   CHOL 140 02/27/2013   TRIG 121.0 02/27/2013   HDL 34.80* 02/27/2013   LDLCALC 81 02/27/2013   ALT 22 06/28/2014   AST 30 06/28/2014   NA 134* 07/05/2014   K 4.5 07/05/2014   CL 99 07/05/2014   CREATININE 0.94 07/05/2014   BUN 29* 07/05/2014   CO2 24 07/05/2014   TSH 1.28 12/02/2011   PSA 1.34 11/23/2013   INR 2.00* 07/02/2014   HGBA1C 5.7* 06/28/2014        Assessment / Plan: 1. Aortic stenosis - severe. S/p pericardial tissue valve replacement. Doing well. Will check  an Echo. Start Cardiac Rehab.  2. Thoracic aortic aneurysm. S/p grafting.  3. Hypertension-well controlled. Will stop lisinopril since this is the probable cause of his cough. Monitor BP at cardiac Rehab.  4. 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   5. Post op Atrial fibrillation. Resolved. Will reduce amiodarone to 200 mg daily. I will follow up in  2 months. If he remains in NSR then will stop amiodarone.   6. Numbness left hand. I suspect this is related to brachial plexus stretch with surgery. Will observe for now.

## 2014-07-29 ENCOUNTER — Ambulatory Visit (HOSPITAL_COMMUNITY): Payer: Commercial Managed Care - HMO

## 2014-08-02 ENCOUNTER — Ambulatory Visit (HOSPITAL_COMMUNITY)
Admission: RE | Admit: 2014-08-02 | Discharge: 2014-08-02 | Disposition: A | Discharge status: Home / Self Care | Payer: Medicare HMO | Source: Ambulatory Visit | Attending: Cardiology | Admitting: Cardiology

## 2014-08-02 DIAGNOSIS — I059 Rheumatic mitral valve disease, unspecified: Secondary | ICD-10-CM

## 2014-08-02 DIAGNOSIS — I351 Nonrheumatic aortic (valve) insufficiency: Secondary | ICD-10-CM

## 2014-08-02 DIAGNOSIS — I7121 Aneurysm of the ascending aorta, without rupture: Secondary | ICD-10-CM

## 2014-08-02 DIAGNOSIS — I350 Nonrheumatic aortic (valve) stenosis: Secondary | ICD-10-CM

## 2014-08-02 DIAGNOSIS — Z952 Presence of prosthetic heart valve: Secondary | ICD-10-CM | POA: Diagnosis not present

## 2014-08-02 DIAGNOSIS — I10 Essential (primary) hypertension: Secondary | ICD-10-CM | POA: Diagnosis present

## 2014-08-02 DIAGNOSIS — I480 Paroxysmal atrial fibrillation: Secondary | ICD-10-CM

## 2014-08-02 DIAGNOSIS — I712 Thoracic aortic aneurysm, without rupture: Secondary | ICD-10-CM

## 2014-08-02 NOTE — Progress Notes (Signed)
2D Echo Performed 08/02/2014    Jeremy Meadows, RCS

## 2014-08-05 ENCOUNTER — Ambulatory Visit (INDEPENDENT_AMBULATORY_CARE_PROVIDER_SITE_OTHER): Payer: Commercial Managed Care - HMO | Admitting: Family Medicine

## 2014-08-05 ENCOUNTER — Encounter: Payer: Self-pay | Admitting: Family Medicine

## 2014-08-05 VITALS — BP 118/76 | HR 84 | Temp 98.2°F | Ht 71.75 in | Wt 188.5 lb

## 2014-08-05 DIAGNOSIS — E7800 Pure hypercholesterolemia, unspecified: Secondary | ICD-10-CM

## 2014-08-05 DIAGNOSIS — I712 Thoracic aortic aneurysm, without rupture: Secondary | ICD-10-CM

## 2014-08-05 DIAGNOSIS — D62 Acute posthemorrhagic anemia: Secondary | ICD-10-CM | POA: Insufficient documentation

## 2014-08-05 DIAGNOSIS — R7303 Prediabetes: Secondary | ICD-10-CM | POA: Insufficient documentation

## 2014-08-05 DIAGNOSIS — I7121 Aneurysm of the ascending aorta, without rupture: Secondary | ICD-10-CM

## 2014-08-05 DIAGNOSIS — Z23 Encounter for immunization: Secondary | ICD-10-CM

## 2014-08-05 DIAGNOSIS — Z952 Presence of prosthetic heart valve: Secondary | ICD-10-CM

## 2014-08-05 DIAGNOSIS — Z954 Presence of other heart-valve replacement: Secondary | ICD-10-CM

## 2014-08-05 DIAGNOSIS — Z7189 Other specified counseling: Secondary | ICD-10-CM | POA: Insufficient documentation

## 2014-08-05 DIAGNOSIS — I10 Essential (primary) hypertension: Secondary | ICD-10-CM

## 2014-08-05 DIAGNOSIS — I350 Nonrheumatic aortic (valve) stenosis: Secondary | ICD-10-CM

## 2014-08-05 DIAGNOSIS — R7309 Other abnormal glucose: Secondary | ICD-10-CM

## 2014-08-05 DIAGNOSIS — I480 Paroxysmal atrial fibrillation: Secondary | ICD-10-CM

## 2014-08-05 DIAGNOSIS — Z0000 Encounter for general adult medical examination without abnormal findings: Secondary | ICD-10-CM | POA: Insufficient documentation

## 2014-08-05 LAB — CBC WITH DIFFERENTIAL/PLATELET
Basophils Absolute: 0 10*3/uL (ref 0.0–0.1)
Basophils Relative: 0.6 % (ref 0.0–3.0)
EOS PCT: 1.3 % (ref 0.0–5.0)
Eosinophils Absolute: 0.1 10*3/uL (ref 0.0–0.7)
HEMATOCRIT: 40.2 % (ref 39.0–52.0)
Hemoglobin: 12.9 g/dL — ABNORMAL LOW (ref 13.0–17.0)
Lymphocytes Relative: 24.7 % (ref 12.0–46.0)
Lymphs Abs: 1.5 10*3/uL (ref 0.7–4.0)
MCHC: 32 g/dL (ref 30.0–36.0)
MCV: 92.7 fl (ref 78.0–100.0)
MONOS PCT: 13.8 % — AB (ref 3.0–12.0)
Monocytes Absolute: 0.8 10*3/uL (ref 0.1–1.0)
Neutro Abs: 3.6 10*3/uL (ref 1.4–7.7)
Neutrophils Relative %: 59.6 % (ref 43.0–77.0)
Platelets: 221 10*3/uL (ref 150.0–400.0)
RBC: 4.33 Mil/uL (ref 4.22–5.81)
RDW: 14.4 % (ref 11.5–15.5)
WBC: 6 10*3/uL (ref 4.0–10.5)

## 2014-08-05 LAB — BASIC METABOLIC PANEL
BUN: 20 mg/dL (ref 6–23)
CHLORIDE: 107 meq/L (ref 96–112)
CO2: 20 mEq/L (ref 19–32)
Calcium: 9.6 mg/dL (ref 8.4–10.5)
Creatinine, Ser: 1.2 mg/dL (ref 0.4–1.5)
GFR: 62.05 mL/min (ref >60.00)
GLUCOSE: 103 mg/dL — AB (ref 70–99)
POTASSIUM: 4.3 meq/L (ref 3.5–5.1)
Sodium: 144 mEq/L (ref 135–145)

## 2014-08-05 LAB — LIPID PANEL
CHOL/HDL RATIO: 4
CHOLESTEROL: 128 mg/dL (ref 0–200)
HDL: 30.4 mg/dL — ABNORMAL LOW (ref >39.00)
LDL Cholesterol: 74 mg/dL (ref 0–99)
NonHDL: 97.6
Triglycerides: 116 mg/dL (ref 0.0–149.0)
VLDL: 23.2 mg/dL (ref 0.0–40.0)

## 2014-08-05 LAB — TSH: TSH: 1.86 u[IU]/mL (ref 0.35–4.50)

## 2014-08-05 NOTE — Patient Instructions (Addendum)
prevnar today. Return in >1 month for shingles shot (nurse visit). Check into B complex for nerve health. Blood work today. Return as needed or in 6 months for follow up.

## 2014-08-05 NOTE — Assessment & Plan Note (Signed)
Recently found in hospital with A1c 5.7% - continue to monitor.

## 2014-08-05 NOTE — Assessment & Plan Note (Signed)
Continue amiodarone - followed by cards.

## 2014-08-05 NOTE — Assessment & Plan Note (Signed)
Recheck CBC. Planning on stopping ferrous sulfate today - but if persistently anemia will recommend restart. Black stools attributed to iron.

## 2014-08-05 NOTE — Assessment & Plan Note (Signed)

## 2014-08-05 NOTE — Addendum Note (Signed)
Addended by: Royann Shivers A on: 08/05/2014 09:20 AM   Modules accepted: Orders

## 2014-08-05 NOTE — Assessment & Plan Note (Signed)
Chronic, stable. Continue metoprolol. Check labs today.

## 2014-08-05 NOTE — Assessment & Plan Note (Signed)
Continue lipitor 10mg  daily. Check FLP today as fasting.

## 2014-08-05 NOTE — Progress Notes (Signed)
BP 118/76 mmHg  Pulse 84  Temp(Src) 98.2 F (36.8 C) (Oral)  Ht 5' 11.75" (1.822 m)  Wt 188 lb 8 oz (85.503 kg)  BMI 25.76 kg/m2   CC: medicare wellness visit  Subjective:    Patient ID: Jeremy Meadows, male    DOB: December 21, 1945, 68 y.o.   MRN: [id number]  HPI: Jeremy Meadows is a 68 y.o. male presenting on 08/05/2014 for Annual Exam   S/p recent AVR and aortic aneurysm grafting by Dr [name] 07/02/2014. Post op afib now on amiodarone with slow titration off. ACEI was discontinued 2/2 hacking cough. Has f/u with Dr [name]. To start cardiac rehab today.  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  L hand numbness - thought brachial plexus stretch that should improve with time. Persistent.   Black stool noticed since surgery. On iron and on aspirin 325mg  daily.   Passes hearing and vision screens  Denies depression,anhedonia, falls   Preventative: COLONOSCOPY Date: 2013 WNL, rpt 5 yrs  Prostate cancer screening - discussed, h/o BPH followed by urology ([facility]) and PSA checked there Flu shot 05/2014 Pneumovax 2013, prevnar today Tdap 11/2011 zostavax - interested - will return for nurse visit Advanced directives: living will at home, [name] is [place]. Asked to bring me a copy for chart.  Lives with [name] Occupation: retired, was in [employer], 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 Activity: softball, walking Diet: good water, fruits/vegetables daily  Relevant past medical, surgical, family and social history reviewed and updated as indicated.  Allergies and medications reviewed and updated. Current Outpatient Prescriptions on File Prior to Visit  Medication Sig  . albuterol (VENTOLIN HFA) 108 (90 BASE) MCG/ACT inhaler Inhale 2 puffs into the lungs every 6 (six) hours as needed for wheezing or shortness of breath.  [name] amiodarone (PACERONE) 200 MG tablet Take 1 tablet (200 mg total) by mouth daily. For 5 days then take Amiodarone 200 mg by mouth two times daily thereafter.  (Patient taking differently: Take 200 mg by mouth daily. )  . aspirin EC 325 MG EC tablet Take 1 tablet (325 mg total) by mouth daily.  [name] atorvastatin (LIPITOR) 10 MG tablet Take 1 tablet (10 mg total) by mouth daily at 6 PM.  . brinzolamide (AZOPT) 1 % ophthalmic suspension Place 1 drop into both eyes 2 (two) times daily.  . cholecalciferol (VITAMIN D) 1000 UNITS tablet Take 1,000 Units by mouth daily.  . Fluticasone Furoate-Vilanterol (BREO ELLIPTA) 100-25 MCG/INH AEPB Inhale 1 puff into the lungs daily.  . folic acid (FOLVITE) 1 MG tablet Take 1 tablet (1 mg total) by mouth daily. For one month then stop.  [name] MELATONIN PO Take 6 mg by mouth at bedtime.  . metoprolol tartrate (LOPRESSOR) 25 MG tablet Take 1 tablet (25 mg total) by mouth 2 (two) times daily.  . traMADol (ULTRAM) 50 MG tablet Take 1-2 tablets (50-100 mg total) by mouth every 6 (six) hours as needed for moderate pain.  . vitamin C (ASCORBIC ACID) 500 MG tablet Take 500 mg by mouth 2 (two) times daily.   No current facility-administered medications on file prior to visit.    Review of Systems Per HPI unless specifically indicated above    Objective:    BP 118/76 mmHg  Pulse 84  Temp(Src) 98.2 F (36.8 C) (Oral)  Ht 5' 11.75" (1.822 m)  Wt 188 lb 8 oz (85.503 kg)  BMI 25.76 kg/m2  Physical Exam  Constitutional: He is oriented to person, place, and time.  He appears well-developed and well-nourished. No distress.  HENT:  Head: Normocephalic and atraumatic.  Right Ear: Hearing, tympanic membrane, external ear and ear canal normal.  Left Ear: Hearing, tympanic membrane, external ear and ear canal normal.  Nose: Nose normal.  Mouth/Throat: Uvula is midline, oropharynx is clear and moist and mucous membranes are normal. No oropharyngeal exudate, posterior oropharyngeal edema or posterior oropharyngeal erythema.  Eyes: Conjunctivae and EOM are normal. Pupils are equal, round, and reactive to light. No scleral icterus.  Neck:  Normal range of motion. Neck supple. Carotid bruit is not present. No thyromegaly present.  Cardiovascular: Normal rate, regular rhythm and intact distal pulses.   Murmur (mild flow murmur) heard. Pulses:      Radial pulses are 2+ on the right side, and 2+ on the left side.  Pulmonary/Chest: Effort normal and breath sounds normal. No respiratory distress. He has no wheezes. He has no rales.  Abdominal: Soft. Bowel sounds are normal. He exhibits no distension and no mass. There is no tenderness. There is no rebound and no guarding.  Musculoskeletal: Normal range of motion. He exhibits no edema.  Lymphadenopathy:    He has no cervical adenopathy.  Neurological: He is alert and oriented to person, place, and time.  CN grossly intact, station and gait intact Recall 3/3 Calculation 3/5 serial 7s  Skin: Skin is warm and dry. No rash noted.  Psychiatric: He has a normal mood and affect. His behavior is normal. Judgment and thought content normal.  Nursing note and vitals reviewed.      Assessment & Plan:   Problem List Items Addressed This Visit    Thoracic ascending aortic aneurysm   S/P AVR (aortic valve replacement) and aortoplasty    Doing well from heart standpoint. Check labs today.    Prediabetes    Recently found in hospital with A1c 5.7% - continue to monitor.    Postoperative anemia due to acute blood loss    Recheck CBC. Planning on stopping ferrous sulfate today - but if persistently anemia will recommend restart. Black stools attributed to iron.    Relevant Orders      CBC with Differential   Medicare annual wellness visit, initial - Primary    I have personally reviewed the Medicare Annual Wellness questionnaire and have noted 1. The patient's medical and social history 2. Their use of alcohol, tobacco or illicit drugs 3. Their current medications and supplements 4. The patient's functional ability including ADL's, fall risks, home safety risks and hearing or visual  impairment. 5. Diet and physical activity 6. Evidence for depression or mood disorders The patients weight, height, BMI have been recorded in the chart.  Hearing and vision has been addressed. I have made referrals, counseling and provided education to the patient based review of the above and I have provided the pt with a written personalized care plan for preventive services. Provider list updated - see scanned questionairre.  Reviewed preventative protocols and updated unless pt declined.    HYPERCHOLESTEROLEMIA    Continue lipitor 10mg  daily. Check FLP today as fasting.    Relevant Orders      Lipid panel      TSH      Basic metabolic panel   HTN (hypertension)    Chronic, stable. Continue metoprolol. Check labs today.    Atrial fibrillation    Continue amiodarone - followed by cards.    Aortic valve stenosis, severe   Advanced care planning/counseling discussion    Advanced directives:  living will at home, [name] is [place]. Asked to bring me a copy for chart.        Follow up plan: Return in about 6 months (around 02/03/2015), or as needed, for follow up visit.

## 2014-08-05 NOTE — Assessment & Plan Note (Signed)
Doing well from heart standpoint. Check labs today.

## 2014-08-05 NOTE — Assessment & Plan Note (Signed)
Advanced directives: living will at home, [name] is [place]. Asked to bring me a copy for chart.

## 2014-08-06 ENCOUNTER — Telehealth: Payer: Self-pay | Admitting: Family Medicine

## 2014-08-06 NOTE — Telephone Encounter (Signed)
emmi emailed °

## 2014-08-15 ENCOUNTER — Ambulatory Visit
Admission: RE | Admit: 2014-08-15 | Discharge: 2014-08-15 | Disposition: A | Discharge status: Home / Self Care | Payer: Commercial Managed Care - HMO | Source: Ambulatory Visit | Attending: Cardiothoracic Surgery | Admitting: Cardiothoracic Surgery

## 2014-08-15 ENCOUNTER — Encounter: Payer: Self-pay | Admitting: Cardiothoracic Surgery

## 2014-08-15 ENCOUNTER — Ambulatory Visit (INDEPENDENT_AMBULATORY_CARE_PROVIDER_SITE_OTHER): Payer: Self-pay | Admitting: Cardiothoracic Surgery

## 2014-08-15 ENCOUNTER — Other Ambulatory Visit: Payer: Self-pay | Admitting: Cardiothoracic Surgery

## 2014-08-15 VITALS — BP 134/82 | HR 64 | Resp 16 | Ht 71.75 in | Wt 188.0 lb

## 2014-08-15 DIAGNOSIS — I4891 Unspecified atrial fibrillation: Secondary | ICD-10-CM

## 2014-08-15 DIAGNOSIS — I712 Thoracic aortic aneurysm, without rupture, unspecified: Secondary | ICD-10-CM

## 2014-08-15 DIAGNOSIS — J8410 Pulmonary fibrosis, unspecified: Secondary | ICD-10-CM

## 2014-08-15 DIAGNOSIS — Z952 Presence of prosthetic heart valve: Secondary | ICD-10-CM

## 2014-08-15 DIAGNOSIS — J849 Interstitial pulmonary disease, unspecified: Secondary | ICD-10-CM

## 2014-08-15 DIAGNOSIS — I350 Nonrheumatic aortic (valve) stenosis: Secondary | ICD-10-CM

## 2014-08-15 DIAGNOSIS — Z954 Presence of other heart-valve replacement: Secondary | ICD-10-CM

## 2014-08-15 NOTE — Patient Instructions (Signed)

## 2014-08-15 NOTE — Progress Notes (Signed)
[address]       [place] [address]             [place] Record #[id number] Date of Birth: October 07, 1945  Referring: [name], Peter M, MD Primary Care: Ria Bush, MD  Chief Complaint:   POST OP FOLLOW UP 07/02/2014 OPERATIVE REPORT PREOPERATIVE DIAGNOSES: 1. Critical aortic stenosis. 2. Ascending aortic aneurysm. 3. 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  POSTOPERATIVE DIAGNOSES: 1. Critical aortic stenosis. 2. Ascending aortic aneurysm. 3. 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  PROCEDURE: Aortic valve replacement with [facility] pericardial tissue valve 23 mm, model 3300TFX, serial #[id number]. Supracoronary replacement of ascending aorta with Hemashield graft 32 mm with cardiopulmonary bypass and circulatory arrest. Right axillary arterial cannulation.  History of Present Illness:     Patient doing well postoperatively, he's had no evidence of congestive heart failure or angina. He is currently enrolled in cardiac rehabilitation and doing well there for the past 2 weeks. He has not needed pain medicine since the first week after discharge. He's had no episodes of recurrent atrial fibrillation     Past Medical History  Diagnosis Date  . Unspecified glaucoma   . Allergic rhinitis, cause unspecified   . Aortic valve stenosis   . HLD (hyperlipidemia)   . Benign neoplasm of colon   . History of nephrolithiasis   . Other testicular hypofunction   . Aortic insufficiency   . Aortic root enlargement     thoracic aorta  . Heart murmur 2014  . Pulmonary nodule, left 2015    2cm, ?sarcoid by biopsy  . Hypertension   . Obstructive sleep apnea (adult) (pediatric)     pt. doesn't use his machine at home.  . Asthma   . History of kidney stones   . Arthritis   . Atrial fibrillation 07/23/2014     History  Smoking status  . Never Smoker   Smokeless  tobacco  . Never Used    History  Alcohol Use No     No Known Allergies  Current Outpatient Prescriptions  Medication Sig Dispense Refill  . albuterol (VENTOLIN HFA) 108 (90 BASE) MCG/ACT inhaler Inhale 2 puffs into the lungs every 6 (six) hours as needed for wheezing or shortness of breath. 1 Inhaler 0  . amiodarone (PACERONE) 200 MG tablet Take 1 tablet (200 mg total) by mouth daily. For 5 days then take Amiodarone 200 mg by mouth two times daily thereafter. (Patient taking differently: Take 200 mg by mouth daily. ) 60 tablet 1  . aspirin EC 325 MG EC tablet Take 1 tablet (325 mg total) by mouth daily. 30 tablet 0  . atorvastatin (LIPITOR) 10 MG tablet Take 1 tablet (10 mg total) by mouth daily at 6 PM. 30 tablet 1  . brinzolamide (AZOPT) 1 % ophthalmic suspension Place 1 drop into both eyes 2 (two) times daily.    . cholecalciferol (VITAMIN D) 1000 UNITS tablet Take 1,000 Units by mouth daily.    . Fluticasone Furoate-Vilanterol (BREO ELLIPTA) 100-25 MCG/INH AEPB Inhale 1 puff into the lungs daily.    [name] MELATONIN PO Take 6 mg by mouth at bedtime.    . metoprolol tartrate (LOPRESSOR) 25 MG tablet Take 1 tablet (25 mg total) by mouth 2 (two) times daily. 60 tablet 1  . vitamin C (ASCORBIC ACID) 500 MG tablet Take 500 mg by mouth 2 (  two) times daily.     No current facility-administered medications for this visit.       Physical Exam: BP 134/82 mmHg  Pulse 64  Resp 16  Ht 5' 11.75" (1.822 m)  Wt 188 lb (85.276 kg)  BMI 25.69 kg/m2  SpO2 97%  General appearance: alert and cooperative Neurologic: intact Heart: regular rate and rhythm, S1, S2 normal, no murmur, click, rub or gallop Lungs: clear to auscultation bilaterally Abdomen: soft, non-tender; bowel sounds normal; no masses,  no organomegaly Extremities: extremities normal, atraumatic, no cyanosis or edema and Homans sign is negative, no sign of DVT Wound: Sternum is stable and well-healed   Diagnostic Studies &  Laboratory data:     Recent Radiology Findings:   Dg Chest 2 View  08/15/2014   CLINICAL DATA:  History of atrial fibrillation, aortic valve replacement on 07/02/2014  EXAM: CHEST  2 VIEW  COMPARISON:  Chest x-ray of 07/05/2014  FINDINGS: Aeration of the lungs has improved with only mild bibasilar linear atelectasis remaining. The small effusions have resolved. No pneumothorax is seen. Mild cardiomegaly is stable. and aortic valve replacement again is noted. No bony abnormality is seen.  IMPRESSION: Improved aeration with mild basilar atelectasis remaining. Resolution of small effusions.   Electronically Signed   By: Ivar Drape M.D.   On: 08/15/2014 12:48      Recent Lab Findings: Lab Results  Component Value Date   WBC 6.0 08/05/2014   HGB 12.9* 08/05/2014   HCT 40.2 08/05/2014   PLT 221.0 08/05/2014   GLUCOSE 103* 08/05/2014   CHOL 128 08/05/2014   TRIG 116.0 08/05/2014   HDL 30.40* 08/05/2014   LDLCALC 74 08/05/2014   ALT 22 06/28/2014   AST 30 06/28/2014   NA 144 08/05/2014   K 4.3 08/05/2014   CL 107 08/05/2014   CREATININE 1.2 08/05/2014   BUN 20 08/05/2014   CO2 20 08/05/2014   TSH 1.86 08/05/2014   INR 2.00* 07/02/2014   HGBA1C 5.7* 06/28/2014      Assessment / Plan:    Patient doing well following aortic valve replacement and replacement of ascending aorta. In addition the patient and his evaluation was found to have a pulmonary nodule in the left lung, fine needle biopsy this was a granuloma. I plan to see the patient back in 6 months with a follow-up CT scan to follow-up on the left lung nodule The patient has been allowed to return driving He will continue in cardiac rehabilitation I discussed with him the need for preprocedure antibiotics for endocarditis prophylaxis.        Grace Isaac MD      [address] [place] [address] Office [phone]   Beeper [phone]  08/15/2014 1:29 PM

## 2014-08-20 ENCOUNTER — Telehealth: Payer: Self-pay | Admitting: Cardiology

## 2014-08-20 MED ORDER — AMIODARONE HCL 200 MG PO TABS: 200.0000 mg | ORAL_TABLET | Freq: Every day | ORAL | Status: DC

## 2014-08-20 MED ORDER — ATORVASTATIN CALCIUM 10 MG PO TABS: 10.0000 mg | ORAL_TABLET | Freq: Every day | ORAL | Status: DC

## 2014-08-20 MED ORDER — METOPROLOL TARTRATE 25 MG PO TABS: 25.0000 mg | ORAL_TABLET | Freq: Two times a day (BID) | ORAL | Status: DC

## 2014-08-20 NOTE — Telephone Encounter (Signed)
Please call,have some questions about his medicine.

## 2014-08-20 NOTE — Telephone Encounter (Signed)
Returned call to patient he stated he needed refills on amiodarone,metoprolol,atorvastatin.90 day refills sent to pharmacy.

## 2014-09-05 ENCOUNTER — Encounter (HOSPITAL_COMMUNITY): Payer: Self-pay | Admitting: Cardiology

## 2014-09-12 ENCOUNTER — Ambulatory Visit (INDEPENDENT_AMBULATORY_CARE_PROVIDER_SITE_OTHER): Payer: Commercial Managed Care - HMO

## 2014-09-12 ENCOUNTER — Ambulatory Visit: Payer: Commercial Managed Care - HMO

## 2014-09-12 DIAGNOSIS — Z23 Encounter for immunization: Secondary | ICD-10-CM

## 2014-09-26 ENCOUNTER — Telehealth: Payer: Self-pay | Admitting: Cardiology

## 2014-09-26 NOTE — Telephone Encounter (Signed)
Received records from [facility]-for appointment with Dr [name] on 09/30/14.  Records given to [facility] (medical records) for Dr Doug Sou schedule on 09/30/14.  lp

## 2014-09-28 ENCOUNTER — Encounter: Payer: Self-pay | Admitting: Family Medicine

## 2014-09-30 ENCOUNTER — Ambulatory Visit (INDEPENDENT_AMBULATORY_CARE_PROVIDER_SITE_OTHER): Payer: Commercial Managed Care - HMO | Admitting: Cardiology

## 2014-09-30 ENCOUNTER — Encounter: Payer: Self-pay | Admitting: Cardiology

## 2014-09-30 VITALS — BP 144/83 | HR 68 | Ht 72.0 in | Wt 194.4 lb

## 2014-09-30 DIAGNOSIS — I10 Essential (primary) hypertension: Secondary | ICD-10-CM

## 2014-09-30 DIAGNOSIS — I351 Nonrheumatic aortic (valve) insufficiency: Secondary | ICD-10-CM | POA: Diagnosis not present

## 2014-09-30 DIAGNOSIS — I350 Nonrheumatic aortic (valve) stenosis: Secondary | ICD-10-CM

## 2014-09-30 DIAGNOSIS — I480 Paroxysmal atrial fibrillation: Secondary | ICD-10-CM | POA: Diagnosis not present

## 2014-09-30 NOTE — Patient Instructions (Signed)
Stop taking amiodarone  Continue your other therapy  I will see you in 6 months.

## 2014-09-30 NOTE — Progress Notes (Signed)
I will  Jeremy Meadows Date of Birth: 03/21/1946 Medical Record #[id number]  History of Present Illness: Mr. Minerva is seen for followup of aortic stenosis and  thoracic aortic aneurysm. He is s/p AVR and aortic aneurysm grafting by Dr. [name] on 07/02/14 with a #23 Magna Ease pericardial valve and 32 mm Hema shield graft. His post op course was complicated by atrial fibrillation that converted to NSR on amiodarone. On follow up today he is feeling well. His prior cough resolved with stopping ACEi.  He has a small incisional hernia at the lower sternum. No SOB. No palpitations.  Current Outpatient Prescriptions on File Prior to Visit  Medication Sig Dispense Refill  . albuterol (VENTOLIN HFA) 108 (90 BASE) MCG/ACT inhaler Inhale 2 puffs into the lungs every 6 (six) hours as needed for wheezing or shortness of breath. 1 Inhaler 0  . aspirin EC 325 MG EC tablet Take 1 tablet (325 mg total) by mouth daily. 30 tablet 0  . atorvastatin (LIPITOR) 10 MG tablet Take 1 tablet (10 mg total) by mouth daily at 6 PM. 90 tablet 3  . brinzolamide (AZOPT) 1 % ophthalmic suspension Place 1 drop into both eyes 2 (two) times daily.    . cholecalciferol (VITAMIN D) 1000 UNITS tablet Take 1,000 Units by mouth daily.    [name] MELATONIN PO Take 6 mg by mouth at bedtime.    . metoprolol tartrate (LOPRESSOR) 25 MG tablet Take 1 tablet (25 mg total) by mouth 2 (two) times daily. 180 tablet 3  . vitamin C (ASCORBIC ACID) 500 MG tablet Take 500 mg by mouth 2 (two) times daily.     No current facility-administered medications on file prior to visit.    No Known Allergies  Past Medical History  Diagnosis Date  . Unspecified glaucoma   . Allergic rhinitis, cause unspecified   . HLD (hyperlipidemia)   . Benign neoplasm of colon   . History of nephrolithiasis   . Other testicular hypofunction   . Aortic insufficiency     s/p AVR 2015  . Aortic root enlargement     thoracic aorta  . Aortic valve stenosis 2015    s/p AVR 2015 [name]) - completed cardiac rehab 08/2014  . Pulmonary nodule, left 2015    2cm, ?sarcoid by biopsy  . Hypertension   . Obstructive sleep apnea (adult) (pediatric)     pt. doesn't use his machine at home.  . Asthma   . History of kidney stones   . Arthritis   . Atrial fibrillation 07/23/2014    Past Surgical History  Procedure Laterality Date  . Rhinoplasty  1980s  . Knee surgery Right 05/2012    arthroscopic  . Medial partial knee replacement Right 03/2013  . Knee surgery Right 12/2013    patella  . Lithotripsy    . Lung biopsy Left 2015    by IR - benign  . Cataract extraction Bilateral   . Joint replacement Right 14    partial replacement  . Aortic valve replacement N/A 07/02/2014    Procedure: AORTIC VALVE REPLACEMENT (AVR) with 23 Aortic Magna Ease;  Surgeon: Grace Isaac, MD  . Ascending aortic root replacement N/A 07/02/2014    Procedure: supra coronary  ASCENDING AORTIC REPLACEMENT to the Inominate Artery with 63mm Hemashield Platinum with  circulatory arrest;  Surgeon: Grace Isaac, MD  . Intraoperative transesophageal echocardiogram N/A 07/02/2014  . Colonoscopy  2013    WNL, rpt 5 yrs  . Left and  right heart catheterization with coronary angiogram N/A 05/22/2014    no significant obstructive CAD ([name])    History  Smoking status  . Never Smoker   Smokeless tobacco  . Never Used    History  Alcohol Use No    Family History  Problem Relation Age of Onset  . CAD Father 21    MI  . Heart disease Brother   . Heart disease Brother   . Heart disease Brother   . Cancer Neg Hx     Review of Systems: As noted in history of present illness. All other systems were reviewed and are negative.  Physical Exam: BP 144/83 mmHg  Pulse 68  Ht 6' (1.829 m)  Wt 194 lb 6.4 oz (88.179 kg)  BMI 26.36 kg/m2 He is a pleasant white male in no acute distress. HEENT: Normal Neck is without JVD, adenopathy, thyromegaly, or bruits. Carotid  upstrokes are normal. Lungs: Clear Chest: median sternotomy and right subclavicular scars are healing well. He does have a small incisional hernia at the lower sternal incision. Cardiovascular: Regular rate and rhythm. Normal S1 and S2. There is a grade 1/6 systolic murmur at the left sternal border. PMI is normal. No gallop. Abdomen: Soft and nontender. No masses or bruits. No hepatosplenomegaly. Bowel sounds are positive. Extremities: No cyanosis or edema. Pulses are 2+ and symmetric. Neuro: Alert and oriented x3. Cranial nerves II through XII are intact.  LABORATORY DATA: Lab Results  Component Value Date   WBC 6.0 08/05/2014   HGB 12.9* 08/05/2014   HCT 40.2 08/05/2014   PLT 221.0 08/05/2014   GLUCOSE 103* 08/05/2014   CHOL 128 08/05/2014   TRIG 116.0 08/05/2014   HDL 30.40* 08/05/2014   LDLCALC 74 08/05/2014   ALT 22 06/28/2014   AST 30 06/28/2014   NA 144 08/05/2014   K 4.3 08/05/2014   CL 107 08/05/2014   CREATININE 1.2 08/05/2014   BUN 20 08/05/2014   CO2 20 08/05/2014   TSH 1.86 08/05/2014   PSA 1.34 11/23/2013   INR 2.00* 07/02/2014   HGBA1C 5.7* 06/28/2014   Echo: 08/02/14: Study Conclusions  - Left ventricle: The cavity size was normal. Wall thickness was normal. Systolic function was normal. The estimated ejection fraction was in the range of 60% to 65%. Features are consistent with a pseudonormal left ventricular filling pattern, with concomitant abnormal relaxation and increased filling pressure (grade 2 diastolic dysfunction). - Aortic valve: AV valve prosthesis opens well Peak and mean gradients through the valve are 17 and 10 mm Hg respectively. - Mitral valve: There was mild regurgitation. - Left atrium: The atrium was moderately dilated.      Assessment / Plan: 1. Aortic stenosis - severe. S/p pericardial tissue valve replacement. Doing well. Echo showed good valve function. He has graduated from [place] and is exercising on his  own.  2. Thoracic aortic aneurysm. S/p grafting.  3. Hypertension-well controlled. BP readings at Rehab were acceptable.   4. 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   5. Post op Atrial fibrillation. Resolved. Will stop amiodarone at this point and observe.   I will follow up in 6 months.

## 2014-10-05 DIAGNOSIS — J209 Acute bronchitis, unspecified: Secondary | ICD-10-CM | POA: Diagnosis not present

## 2014-10-05 DIAGNOSIS — G4733 Obstructive sleep apnea (adult) (pediatric): Secondary | ICD-10-CM | POA: Diagnosis not present

## 2014-10-05 DIAGNOSIS — J300 Vasomotor rhinitis: Secondary | ICD-10-CM | POA: Diagnosis not present

## 2014-10-05 DIAGNOSIS — H409 Unspecified glaucoma: Secondary | ICD-10-CM | POA: Diagnosis not present

## 2014-10-08 ENCOUNTER — Encounter: Payer: Self-pay | Admitting: Cardiology

## 2014-11-05 DIAGNOSIS — G4733 Obstructive sleep apnea (adult) (pediatric): Secondary | ICD-10-CM | POA: Diagnosis not present

## 2014-11-05 DIAGNOSIS — H409 Unspecified glaucoma: Secondary | ICD-10-CM | POA: Diagnosis not present

## 2014-11-05 DIAGNOSIS — J209 Acute bronchitis, unspecified: Secondary | ICD-10-CM | POA: Diagnosis not present

## 2014-11-05 DIAGNOSIS — J300 Vasomotor rhinitis: Secondary | ICD-10-CM | POA: Diagnosis not present

## 2014-11-18 ENCOUNTER — Ambulatory Visit: Payer: Commercial Managed Care - HMO | Admitting: Pulmonary Disease

## 2014-11-21 ENCOUNTER — Encounter: Payer: Self-pay | Admitting: Family Medicine

## 2014-11-21 DIAGNOSIS — J4540 Moderate persistent asthma, uncomplicated: Secondary | ICD-10-CM

## 2014-11-21 DIAGNOSIS — D869 Sarcoidosis, unspecified: Secondary | ICD-10-CM

## 2014-11-21 DIAGNOSIS — R911 Solitary pulmonary nodule: Secondary | ICD-10-CM

## 2014-11-21 DIAGNOSIS — G4733 Obstructive sleep apnea (adult) (pediatric): Secondary | ICD-10-CM

## 2014-11-22 NOTE — Telephone Encounter (Signed)
See mychart message. Pt unsure if needed referral to return to see Wittmann. placed.

## 2014-11-28 ENCOUNTER — Ambulatory Visit (INDEPENDENT_AMBULATORY_CARE_PROVIDER_SITE_OTHER): Payer: Commercial Managed Care - HMO | Admitting: Pulmonary Disease

## 2014-11-28 ENCOUNTER — Encounter: Payer: Self-pay | Admitting: Pulmonary Disease

## 2014-11-28 VITALS — BP 134/80 | HR 57 | Temp 97.1°F | Ht 72.0 in | Wt 202.2 lb

## 2014-11-28 DIAGNOSIS — I350 Nonrheumatic aortic (valve) stenosis: Secondary | ICD-10-CM

## 2014-11-28 DIAGNOSIS — J4520 Mild intermittent asthma, uncomplicated: Secondary | ICD-10-CM

## 2014-11-28 DIAGNOSIS — Z954 Presence of other heart-valve replacement: Secondary | ICD-10-CM

## 2014-11-28 DIAGNOSIS — I712 Thoracic aortic aneurysm, without rupture: Secondary | ICD-10-CM

## 2014-11-28 DIAGNOSIS — I10 Essential (primary) hypertension: Secondary | ICD-10-CM | POA: Diagnosis not present

## 2014-11-28 DIAGNOSIS — I7121 Aneurysm of the ascending aorta, without rupture: Secondary | ICD-10-CM

## 2014-11-28 DIAGNOSIS — G4733 Obstructive sleep apnea (adult) (pediatric): Secondary | ICD-10-CM

## 2014-11-28 DIAGNOSIS — Z952 Presence of prosthetic heart valve: Secondary | ICD-10-CM

## 2014-11-28 DIAGNOSIS — R911 Solitary pulmonary nodule: Secondary | ICD-10-CM | POA: Diagnosis not present

## 2014-11-28 NOTE — Progress Notes (Signed)
Subjective:     Patient ID: Jeremy Meadows, male   DOB: 10-Jan-1946, 69 y.o.   MRN: [id number]  HPI 69 y/o WF here for a follow up visit... he has multiple medical problems as noted below...   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 He has had considerable trouble over the past yr w/ his right knee- SEE BELOW, soon to be sched for a part knee resurfacing ([facility]) [facility].. Today's exam reveals a new Gr2-3/6 MR murmur & this evaluation is under way...  We reviewed the following medical problems during today's office visit >>     Ophthal> Hx glaucoma on gtts and s/p cat surg in 2012...    OSA> hx mod OSA & optimized to CPAP11 by Jeremy Meadows in 2010; he later decr to just prn snoring complaints from his wife, but he tells Meadows he stopped it completely in 2013- says he is doing fine, rests well, good daytime alertness, and no sleep issues noted...     Asthma> he is a never smoker, Hx AB in past & prev on Asmanex, Singulair, but now just Proair prn; he denies cough, sput, hemoptysis, SOB, etc...    +FamHxCAD> pt's father died at 475w/ ?MI; 1Bro w/ CABG; 1Bro w/ stents; he had Myoview 2008 which was neg- no ischemia, no infarct, EF=58%... New heart murmur heard 6/14=> eval in progress...    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    CHOL> on Simva20, FishOil; he took OTC Niacin500 this past yr & recently ran out (tol satis); FLP 6/14 shows TChol 140, TG 121, HDL 35, LDL 81...    GI- colon polyps> hx several tubular adenomas removed- last 12/10 was removed piecemeal & repeat colon 4/13 by Jeremy Meadows was neg- f/u planned 54yr..     GU- Kidney stones, Hypogonadism> followed by Jeremy Meadows, last stone 8/12 & passed w/ medical therapy; known Low-T (as low as 140 in 2011); he tried several topical meds but always stopped the  Rx; off again & states he's feeling fine, good energy, no libido issues etc & he does not want to restart the meds...    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    DJD spine w/ wedge deformities> old CXRs back to 2007 show 2 wedge deformities in mid Tspine; he denies pain & plays active adult softball league til his recent knee problems...    Vit[place] & treated w/ 50K x8wks then switching to VitD 1000u daily... He needs a baseline BMD. We reviewed prob list, meds, xrays and labs> see below for updates >>   LABS 4/14 by Jeremy Meadows):  Chems- wnl x/ Na=154 Cr=1.3;  CBC- wnl;  Sed=10;  ANA= neg;  A1c=5.8;  B12=620;  SPE/IEP= wnl;  VitD=26 => treated w/ Vit D supplement...  LABS 6/14:  FLP- ok on Simva20;  TSH=0.95...  CXR 6/14 showed  norm heart size, sl hyperinflated lungs- clear/ NAD, DJD Tspine w/ 2 wedge deformities- old, no change...  EKG 6/14 showed NSR, rate74, some incr voltage & NSSTTWA... 2DEcho => mod calcif AoV leaflets w/ mod to severe AI & mod dil Aorta; norm LVF; refer to Cards for further eval => seen by Jeremy Meadows w/ AS/ AI/ Asc Ao Aneurysm CT Angio 6/15 showed heavily calcif AoV, 5.1cm prox ascending Ao, mild bibasilar atx otherw clear lungs, vertebral compression in T8 T9 T10 w/o change => started on Amlodipine.  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   Jeremy Meadows's CC is arthritis which limits his mobility;  [place] he says...   He has gained 9# up to 206# & we reviewed diet, exercise, wt reduction strategies;  He remains on Simva20, Celebrex200 prn...   We reviewed prob list, meds, xrays and labs> see below for updates >>   ~  May 17, 2014:  [id number] & here for pulmonary follow up>  StevRichardson Meadows certainly had a difficult time Meadows-  First he had knee surg (MakCandie Chroman4 & arthroscopy 4/15 by [facility]), then another kidney stone & ultimately required Lithotripsy (Jeremy Meadows 5/15);  He had routine 29mo 27mow/ Jeremy Meadows 6/15 w/ 2DEcho showing norm LV size & function w/ EF=60-65% and norm wall motion, Gr1DD, AoV may be bicuspid? mod thickened & severely calcif, reduced cusp separation & severe AS but no AI identified, Ao root dilated to 5.4cm, mean gradient42 & peak gradient65;  6/15 CTAngio showed a NEW bilobed LLL nodule ~2.5cm in size, AoV calcif, Asc Ao measures 5.1cm;  He referred the pt to Jeremy Meadows & his 7/15 note is reviewed- he noted the pt's excellent functional capacity (Zubrod score of zero- norm activity & no symptoms) and proceeded w/ PFT, PET, Needle bx>   PFT done 7/15 & showed FVC=3.72 (79%), FEV1=2.54 (73%), %1sec=68%, mid-flows=64% predicted (c/w mild airflow obstruction); post bronchodil spirometry showed FEV1=2.94 (85%) which is a 15% reversible (asthmatic) component; Lung Volumes and DLCO were WNL...  PET Scan 7/15 showed low grade metabolic activ in LLL nodule w/ SUV max= 1.7, no other lesions identified, no nodal or distant met dis seen, AscAo aneurysm measured 5.6cm, incidental sm calcif gallstones  Needle bx LLL lesion by IR 7/15>  noncaseating granulomatous inflamm, fibroblastic foci, no evid of malig...   Subseq LABS>  ACE level = 37 (8-52)... He still needs collagen-vasc screen (Sed, RA factor, ANA, ANCA) but has just come off Pred & we will wait  several weeks to check these...  Jeremy Meadows has rec f/u w/ Cards for Cath- check coronaries, check AoV & ascending Ao for timing of surg; and we will need to know if the addition of a wedge resection of the LLL nodule would increase the difficulty/ morbidity of the CV procedure or not; if this is not recommended then we will continue to follow the lesion w/ another CT in 29mont37month    We reviewed the following medical problems during today's office visit >> he has established w/ DrGutierrez for Primary Care...    OSA> hx mod OSA & optimized to CPAP11 by Jeremy Meadows in 2010; he later decr to just  prn snoring complaints from his wife, and he stopped it completely in 2013; since then he tried a relatives new Autoset machine and nasal pillows and liked this apparatus- he requests [place] to get this new equipment for him.    Asthma> he is a never smoker, Hx AB in past & prev on Asmanex, Singulair, then just Proair prn; he developed incr SOB, cough w/ green mucous, & wheezing that required freq use of his ProairHFA- seen by TP 7/15 & given Augmentin/ Pred taper/ and started on Breo100 (one puff daily)=> improved now & he will continue the Breo & prn Proair... NOTE PFTs 7/15 w/ mild obstruction & reversible component. ADDENDUM>> Pt returned for Collagen-vasc screen (neg Rheum factor, ANA, ANCA, & Sed=34) & Quantiferon Gold assay (NEG)... He had his Heart Cath 05/22/14- AoV peak gradient=60, mean=28, AoV area=1.3; marked enlargement of AscAo; Coronaries- some non-obstructive dis present w/ focal 20-30% mid-LAD & 50% ostial RCAstenosis (sm vessel)... Pt has had f/u Jeremy Meadows & surg set for 10/6- pt indicates that they are not in favor of resecting the nodule at time of his heart surg.. I reviewed w/ pt the results to date- LLL nodule ?etiology, bx showed noncaseating granulomatous inflamm, ACE=37, neg collagen-vasc work up, neg QuantiferonGold, no evid malignancy to date (appears benign but etiology still not known w/  certainty)...  ~  November 28, 2014:  64moROV & Jeremy Meadows reports that he had his open heart surg 10/15 by Jeremy Meadows- s/p AVR w/ pericardial tissue valve and ThorAoAneurysm grafted; his post op course was complicated by AFib (converted on Amio), ACE cough (resolved off the Lisin), and incisional hernia in epigastric area;  He had f/u Cards eval by Jeremy Meadows w/ 2DEcho showing good LVF & good AoV function; he did Cardiac Rehab & has since graduated & back to exercising on his own...  The LLL nodule was left alone, not removed w/ prev bx c/w granulomatous inflamm;  Last CXR 11/15 showed resolved effusions, improved aeration with mild basilar atelectasis, no nodule seen...     We reviewed prob list, meds, xrays and labs> see below for updates >> he is up to date on needed vaccinations... PLAN>> SRichardson Landryis stable post-op; he has AlbutHFA for prn use but hasn't needed; he is exercising on his own and steadily improving; we discussed ROV 169yr sooner if needed for problems...           PROBLEM LIST:    GLAUCOMA (ICD-365.9) - on LUMIGAN eye drops per Ophthalmology... ~  He had cats found by Optometrist recently & [facility]> cats removed in 2012 by his report.  ALLERGIC RHINITIS (ICD-477.9) - uses OTC antihistamines Prn... prev on Rhinocort. ~  He reports allergic to pickles now & Benedryl helps....  OBSTRUCTIVE SLEEP APNEA (ICD-327.23) - had f/u eval Jeremy Meadows in 2010- optimal CPAP= 11, and using it regularly now... notes some mild snoring, but apparently doesn't disturb his wife... rests well, wakes refreshed, no daytime hypersomnolence... ~  He saw Jeremy Meadows for sleep f/u 1/12> no issues w/ CPAP or mask, compliant w/ Rx, improved daytime alertness;  he has mod to severe dis & dislikes the CPAP- alternatives discussed; advised wt reduction & he wants to try oral appliance, has appt w/ DrMKatz... ~  3/13: pt tells Meadows that he has no issues w/ his CPAP just doesn't like using it & admits to only  using it intermittently now when his wife c/o his snoring; he claims good daytime alertness & no hypersomnolence  or problems that he is aware of... ~  6/14: hx mod OSA & optimized to CPAP11 by Jeremy Meadows in 2010; he later decr to just prn snoring complaints from his wife, but he tells Meadows he stopped it completely in 2013- says he is doing fine, rests well, good daytime alertness, and no sleep issues noted. ~  8/15: hx mod OSA & optimized to CPAP11 by Jeremy Meadows in 2010; he later decr to just prn snoring complaints from his wife, and he stopped it completely in 2013; since then he tried a relatives new Autoset machine and nasal pillows and liked this apparatus- he requests [place] to get this new equipment for him. ~  3/16:  He tells Meadows he has not been using his CPAP despite having the new machine & supplies; he notes resting well, min snoring per wife and he is not having apneas by her description; wakes refreshed, no daytme hypersomnolence...   ASTHMA and ASTHMATIC BRONCHITIS >>  ~  uses PROAIR Prn & SINGULAIR 54m Prn as well... prev on Asmanex but hasn't needed this yr. ~  he has been stable without signif URIs and no recent exac even w/ his strenuous activity & softball games... 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 ~  CXR 6/14 showed norm heart size, sl hyperinflated lungs- clear/ NAD, DJD Tspine w/ 2 wedge deformities- old, no change... ~  PFT done 7/15 & showed FVC=3.72 (79%), FEV1=2.54 (73%), %1sec=68%, mid-flows=64% predicted (c/w mild airflow obstruction); post bronchodil spirometry showed FEV1=2.94 (85%) which is a 15% reversible (asthmatic) component; Lung Volumes and DLCO were WNL..[name] ~  8/15: he is a never smoker, Hx AB in past & prev on Asmanex, Singulair, then just Proair prn; he developed incr SOB, cough w/ green mucous, & wheezing that required freq use of his ProairHFA- seen by TP 7/15 & given Augmentin/ Pred taper/ and started on Breo100  (one puff daily)=> improved now & he will continue the Breo & prn Proair... NOTE PFTs 7/15 w/ mild obstruction & reversible component.  ABNORMAL CXR and CT CHEST w/ LLL NODULE >> discovered 6/15 on f/u CT Chest to check AscAoAneurysm, eval by Jeremy Meadows... ~  6/15:  CTAngio showed a new bilobed LLL nodule ~2.5cm in size, AoV calcif, Asc Ao measures 5.1cm... ~  PET Scan 7/15 showed low grade metabolic activ in LLL nodule w/ SUV max= 1.7, no other lesions identified, no nodal or distant met dis seen, AscAo aneurysm measured 5.6cm, incidental sm calcif gallstones. ~  Needle bx LLL lesion by IR 7/15>  noncaseating granulomatous inflamm, fibroblastic foci, no evid of malig...  ~  8/15: subseq LABS>  ACE level = 37 (8-52)... collagen-vasc screen (Sed=34, RA factor-neg, ANA-neg, ANCA-neg) & Quantiferon Gold assay was neg... ~  10/15: pt had AVR & ThorAoAneurysm graft by Jeremy Meadows; the LLL nodule was left alone & pt informs Meadows that Jeremy Meadows & JMartiniquewill be following this lesion... ~  Post op CXRs 11/15 showed resolved effusions, improved aeration with mild basilar atelectasis, no nodule seen...   Family Hx of CARDIAC DISEASE (ICD-429.9) - Father died age 8171w/ ?MI, 1 brother w/ CABG, one brother w/ stents> we discussed risk factor reduction strategy, ASA, etc... AORTIC VALVE DISEASE w/ AS/AI >> discovered w/ exam showing new cardiac murmur 6/14... ASCENDING AORTIC ANEURYSM >> CT Angio 6/14 showed 5.1cm asc ao dilitation ~  He denies CP, palpit, dizzy, SOB, edema, etc... ~  NuclearStressTest 3/08 was  neg- no ischemia, no infarct, EF= 58%... ~  EKG 3/13 showed NSR, rate74, NSSTTWA, NAD... ~  EKG 6/14 showed NSR, rate74, some incr voltage & NSSTTWA. ~  6/14:  Pt remains asymptomatic w/o CP, palpit, SOB, etc; but exam today revealed Gr2/6 sys murmur & LZ7/6 diastolic blow at LSB...  ~  2DEcho 6/14 showed norm LV wall thickness & norm LVF w/ EF=60-65%, mod calcif AoV (annulus & leaflets), AS not mentioned  specifically (see the report), mod to severe AI, mod Asc Ao dil, mean gradient41/ peak gradient63... ~  CTAngio 6/14 showed heavily calcif AoV, 5.1cm prox ascending Ao, mild bibasilar atx otherw clear lungs, vertebral compression in T8 T9 T10 w/o change. ~  Pt evaluated by Meadows 6/14 & 12/14 for AS/ AI/ Daryll Brod Ao aneurysm measuring 5.1cm...  ~  2DEcho 6/15 showing norm LV size & function w/ EF=60-65% and norm wall motion, Gr1DD, AoV may be bicuspid? mod thickened & severely calcif, reduced cusp separation & severe AS but no AI identified, Ao root dilated to 5.4cm, mean gradient42 & peak gradient65...  ~  CTAngio 6/15 showed a NEW bilobed LLL nodule ~2.5cm in size, AoV calcif, Asc Ao measures 5.1cm ~  Full eval by Meadows, Cards and Jeremy Meadows, CV Surg => as noted above: Severe AS, Asc Thor Ao Aneurysm & SURG sched for 07/02/14... ~  S/P AVR & ThorAoAneurysm grafting 10/15 by Jeremy Meadows using pericardial tissue valve; the pulm nodule was left alone as biopsy revealed prob granulomatous lesion...  ~  Post-op course complicated by PAF- converted to NSR w/ Amio, ACE related cough, & sm incisional hernia in epigastrium...  ~  1/16: he had f/u Jeremy Meadows- 2DEcho showed good valve function, norm EF, and BP well controlled on Metop25Bid; holding NSR & Amio stopped; he had graduated from Cardiac Rehab...   FOLLOWED by DrGuttierez for PRIMARY CARE >>   HYPERCHOLESTEROLEMIA (ICD-272.0) - on SIMVASTATIN 7m/d, & FISH OIL 10014md... ~  [place] showed TChol 128, TG 147, HDL 31, LDL 67 ~  FLP 11/09 showed TChol 124, TG 122, HDL 33, LDL 67 ~  FLP 11/10 showed TChol 120, TG 150, HDL 31, LDL 59 ~  FLP 2/12 showed TChol 121, TG 182, HDL 28, LDL 56... rec better low fat diet & incr exerc. ~  FLP 3/13 on Simva20 showed TChol 136, TG 147, HDL 35, LDL 72 ~  FLP 6/14 on Simva20 showed TChol 140, TG 121, HDL 35, LDL 81 (he tried Niacin for ~1y6yrior to this & no ch in HDL). ~  Lipids followed by DrGutierrez on  Lipitor10...   COLONIC POLYPS (ICD-211.3) - colonoscopy 2/05 by Jeremy Meadows showed 3mm76mlyp removed= tubular adenoma... ~  He had colonoscopy Jeremy Meadows 12/10> 2 polyps removed & one was a tubular adenoma & removed piecemeal therefore rec f/u 4yrs36yr~  4/13: hx several tubular adenomas removed- last 12/10 was removed piecemeal & repeat colon 4/13 by Jeremy Meadows was neg- f/u planned 29yrs.39yrPHROLITHIASIS (ICD-592.0) - eval 6/10 by DrDahlstadt w/ Lithotripsy required... given RAPAFLO to aide passing the stone. ~  He saw Urology [name] for kidney stones after last visit & he wanted to do ureteroscopy by pt declined & has remained asymptomatic he says... ~  He had f/u DrDalstedt 8/12> another right ureteral stone ~5mm (a42ma 7mm sto57min the right kidney) which he passed spont w/ medical expulsive therapy; f/u UA clear & he is doing satis...  TESTICULAR HYPOFUNCTION (ICD-257.2) ~  He noted low energy & labs checked  12/11 showed Low-T w/ testos level = 140 (350-890);  he was started on Topical Androgel after prior auth done & approved but only took it for 1 month & stopped (misunderstood); we will recheck level & discuss options... ~  labs 2/12 showed Testosterone level = 240 & rec ANDROGEL vs Urology f/u, he will decide. ~  3/13: He again reports not taking the topical Androgel but notes that he felt betterwhile on it; he will commit to taking it regularly this time; Testos level = 199 & we will try to get autorization for ANDROGEL1.62% vs AXIRON... ~  6/14:  known Low-T (as low as 140 in 2011); he tried several topical meds but always stopped the Rx; off again & states he's feeling fine, good energy, no libido issues etc & he does not want to restart the meds...  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... ~  VitD defic> Neuro in [place] found VitD=26 (4/14) & treated w/ 50K x8wks then switching to VitD 1000u daily.  HEALTH MAINTENANCE:  he takes ASA 89m/d, and Calcium/ Vitamins/ etc... Now followed by DrGutierrez for Primary Care. ~  GI>  Followed by Jeremy Meadows & due for f/u colonoscopy now (3/13)... ~  GU>  Followed by Jeremy Meadows & passed kid stone 2012; Low-T as noted above & rx w/ Topical gel rx + Levitra... ~  Immunizations:  He gets the yearly Flu vaccine;  Given TDAP & PNEUMOVAX 3/13 at age 89..   Past Surgical History  Procedure Laterality Date  . Rhinoplasty  1980s  . Knee surgery Right 05/2012    arthroscopic  . Medial partial knee replacement Right 03/2013  . Knee surgery Right 12/2013    patella  . Lithotripsy    . Lung biopsy Left 2015    by IR - benign  . Cataract extraction Bilateral   . Joint replacement Right 14    partial replacement  . Aortic valve replacement N/A 07/02/2014    Procedure: AORTIC VALVE REPLACEMENT (AVR) with 23 Aortic Magna Ease;  Surgeon: EGrace Isaac MD  . Ascending aortic root replacement N/A 07/02/2014    Procedure: supra coronary  ASCENDING AORTIC REPLACEMENT to the Inominate Artery with 393mHemashield Platinum with  circulatory arrest;  Surgeon: EdGrace IsaacMD  . Intraoperative transesophageal echocardiogram N/A 07/02/2014  . Colonoscopy  2013    WNL, rpt 5 yrs  . Left and right heart catheterization with coronary angiogram N/A 05/22/2014    no significant obstructive CAD ([place]   Outpatient Encounter Prescriptions as of 11/28/2014  Medication Sig  . albuterol (VENTOLIN HFA) 108 (90 BASE) MCG/ACT inhaler  Inhale 2 puffs into the lungs every 6 (six) hours as needed for wheezing or shortness of breath.  . Marland Kitchenspirin 81 MG tablet Take 81 mg by mouth daily.  . Marland Kitchentorvastatin (LIPITOR) 10 MG tablet Take 1 tablet (10 mg total) by mouth daily at 6 PM.  . brinzolamide (AZOPT)  1 % ophthalmic suspension Place 1 drop into both eyes 2 (two) times daily.  . cholecalciferol (VITAMIN D) 1000 UNITS tablet Take 1,000 Units by mouth daily.  . metoprolol tartrate (LOPRESSOR) 25 MG tablet Take 1 tablet (25 mg total) by mouth 2 (two) times daily.  . vitamin C (ASCORBIC ACID) 500 MG tablet Take 500 mg by mouth 2 (two) times daily.  . [DISCONTINUED] aspirin EC 325 MG EC tablet Take 1 tablet (325 mg total) by mouth daily. (Patient not taking: Reported on 11/28/2014)  . [DISCONTINUED] MELATONIN PO Take 6 mg by mouth at bedtime.    No Known Allergies   Current Medications, Allergies, Past Medical History, Past Surgical History, Family History, and Social History were reviewed in [employer] record.   Review of Systems    The patient denies fever, chills, sweats, anorexia, fatigue, weakness, malaise, weight loss, sleep disorder, blurring, diplopia, eye irritation, eye discharge, vision loss, eye pain, photophobia, earache, ear discharge, tinnitus, decreased hearing, nasal congestion, nosebleeds, sore throat, hoarseness, chest pain, palpitations, syncope, dyspnea on exertion, orthopnea, PND, peripheral edema, cough, dyspnea at rest, excessive sputum, hemoptysis, wheezing, pleurisy, nausea, vomiting, diarrhea, constipation, change in bowel habits, abdominal pain, melena, hematochezia, jaundice, gas/bloating, indigestion/heartburn, dysphagia, odynophagia, dysuria, hematuria, urinary frequency, urinary hesitancy, nocturia, incontinence, back pain, joint pain, joint swelling, muscle cramps, muscle weakness, stiffness, arthritis, sciatica, restless legs, leg pain at night, leg pain with exertion, rash, itching,  dryness, suspicious lesions, paralysis, paresthesias, seizures, tremors, vertigo, transient blindness, frequent falls, frequent headaches, difficulty walking, depression, anxiety, memory loss, confusion, cold intolerance, heat intolerance, polydipsia, polyphagia, polyuria, unusual weight change, abnormal bruising, bleeding, enlarged lymph nodes, urticaria, allergic rash, hay fever, and recurrent infections.     Objective:   Physical Exam    WD, WN, 69 y/o WM in NAD... GENERAL:  Alert & oriented; pleasant & cooperative. HEENT:  [place]/AT, EOM-wnl, Hx Glaucoma on gtts, EACs-clear, TMs-wnl, NOSE-clear, THROAT-clear & wnl. NECK:  Supple w/ full ROM; no JVD; normal carotid impulses w/o bruits; no thyromegaly or nodules palpated; no lymphadenopathy. CHEST:  Clear to P & A; without wheezes/ rales/ or rhonchi. HEART:  Median sternotomy scar; Regular Rhythm; Gr1/6 sys murmur at LSB, w/o rubs or gallops heard... ABDOMEN:  Soft & nontender; normal bowel sounds; no organomegaly or masses detected. RECTAL:  Neg - prostate 2+ & nontender w/o nodules; stool hematest neg. EXT: without deformities or arthritic changes; no varicose veins/ venous insuffic/ or edema. NEURO:  CN's intact; motor testing normal; sensory testing normal; gait normal & balance OK. DERM:  No lesions noted; no rash etc...  RADIOLOGY DATA:  Reviewed in the EPIC EMR & discussed w/ the patient...    LABORATORY DATA:  Reviewed in the EPIC EMR & discussed w/ the patient...    Assessment:      OSA>  He is again not using his CPAP- states resting well, min snoring per wife and no nocturnal apneas notes, wakes refreshed, no daytime hypersomnolence...  ASTHMA/ AB>  Episode of AB w/ exac 7/15 7 improved after Pred taper, Breo100 added to his ProairHFA rescue inhaler; now off all meds again & using the AlbutHFA inhaler as needed...  PULM NODULE LLL> see on CT Chest 6/15 & eval as above by Jeremy Meadows- needle bx by IR should noncaseating  granulomatous inflamm but this presentation is unusual/ discordant for dx of Sarcoidosis (ie- no ILD & no adenopathy etc) and ACE level is normal; Collagen-vasc screen was unrevealing; Jeremy Meadows did not eval/remove the lesion  at time of his open heart surg and this will be followed over time...    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  CHOL>  On Lip10 per DrGutierrez who follows his labs...  Colon polyps>  Follow up colon 4/13 was clear... Repeat planned for 89yr per Jeremy Meadows...  GU>  Hx nephrolithiasis & testic hypofunction>  He passed a stone on his own last yr w/ MET & doing well;  He has Low-T as well but does not want to take meds...  DJD>  [place](Jeremy Meadows) w/ arthroscopy, mult shots, & Makoplasty 7/14...     Plan:     Patient's Medications  New Prescriptions   No medications on file  Previous Medications   ALBUTEROL (VENTOLIN HFA) 108 (90 BASE) MCG/ACT INHALER    Inhale 2 puffs into the lungs every 6 (six) hours as needed for wheezing or shortness of breath.   ASPIRIN 81 MG TABLET    Take 81 mg by mouth daily.   ATORVASTATIN (LIPITOR) 10 MG TABLET    Take 1 tablet (10 mg total) by mouth daily at 6 PM.   BRINZOLAMIDE (AZOPT) 1 % OPHTHALMIC SUSPENSION    Place 1 drop into both eyes 2 (two) times daily.   CHOLECALCIFEROL (VITAMIN D) 1000 UNITS TABLET    Take 1,000 Units by mouth daily.   METOPROLOL TARTRATE (LOPRESSOR) 25 MG TABLET    Take 1 tablet (25 mg total) by mouth 2 (two) times daily.   VITAMIN C (ASCORBIC ACID) 500 MG TABLET    Take 500 mg by mouth 2 (two) times daily.  Modified Medications   No medications on file  Discontinued Medications   ASPIRIN EC 325 MG EC TABLET    Take 1 tablet (325 mg total) by mouth daily.   MELATONIN PO    Take 6 mg by mouth at bedtime.

## 2014-11-28 NOTE — Patient Instructions (Signed)
Today we updated your med list in our EPIC system...    Continue your current medications the same...  Call for any questions or if we can be of service in any way...  Let's plan a follow up visit in 71yr, sooner if needed for problems.Marland KitchenMarland Kitchen

## 2014-12-04 DIAGNOSIS — J209 Acute bronchitis, unspecified: Secondary | ICD-10-CM | POA: Diagnosis not present

## 2014-12-04 DIAGNOSIS — J300 Vasomotor rhinitis: Secondary | ICD-10-CM | POA: Diagnosis not present

## 2014-12-04 DIAGNOSIS — G4733 Obstructive sleep apnea (adult) (pediatric): Secondary | ICD-10-CM | POA: Diagnosis not present

## 2014-12-04 DIAGNOSIS — H409 Unspecified glaucoma: Secondary | ICD-10-CM | POA: Diagnosis not present

## 2015-01-04 DIAGNOSIS — J300 Vasomotor rhinitis: Secondary | ICD-10-CM | POA: Diagnosis not present

## 2015-01-04 DIAGNOSIS — J209 Acute bronchitis, unspecified: Secondary | ICD-10-CM | POA: Diagnosis not present

## 2015-01-04 DIAGNOSIS — G4733 Obstructive sleep apnea (adult) (pediatric): Secondary | ICD-10-CM | POA: Diagnosis not present

## 2015-01-04 DIAGNOSIS — H409 Unspecified glaucoma: Secondary | ICD-10-CM | POA: Diagnosis not present

## 2015-01-14 ENCOUNTER — Other Ambulatory Visit: Payer: Self-pay | Admitting: *Deleted

## 2015-01-14 DIAGNOSIS — R911 Solitary pulmonary nodule: Secondary | ICD-10-CM

## 2015-01-14 NOTE — Op Note (Signed)
PATIENT NAME:  Jeremy Meadows, Jeremy Meadows MR#:  [id number] DATE OF BIRTH:  13-May-1946  DATE OF PROCEDURE:  06/20/2012  PREOPERATIVE DIAGNOSIS: Right knee medial meniscus tear and arthritis.  POSTOPERATIVE DIAGNOSIS: Right knee medial meniscus tear and arthritis with lateral meniscus tear.   PROCEDURES  1. Arthroscopy. 2. Partial medial and lateral meniscectomy.   SURGEON: Laurene Footman, MD  ANESTHESIA: General.   DESCRIPTION OF PROCEDURE: The patient was brought to the operating room and after adequate anesthesia was obtained, the right leg was prepped and draped in the usual sterile fashion. With patient identification and time-out procedures completed and tourniquet applied with the arthroscopic legholder and having prepped and draped the leg, inferolateral portal was made after time-out procedure. On initial inspection with the arthroscope, there was mild patellofemoral degenerative change. There was joint debris with small articular fragments present within the suprapatellar pouch and mild synovitis. Coming around the medial compartment, an inferomedial portal was made. There was some superficial loss of the articular cartilage in the medial compartment. This again was fairly superficial. There was some fissuring and partial small cartilage flaps. On probing there was a complex tear of the posterior horn of the medial meniscus involving most of the posterior third predominantly though at the junction of the middle and posterior thirds. This was debrided with use of a meniscal punch and ArthroCare wand. The ACL was intact and going to the lateral compartment there was extensive degenerative change with exposed bone present on the tibia. MRI had shown osteochondral lesion in the distal femur but this was not visible and there was not a loose flap on the distal condyle of the femur and it appeared stable. There was a very extensive meniscus tear present to the lateral compartment with anterior and posterior  tears being present. This was debrided with a punch shaver and wand to get back to stable margin and postprocedure pictures having been obtained. The gutters were checked and there were no loose bodies. After thorough irrigation of the knee, all instrumentation was withdrawn. The knee was infiltrated with 20 mL of 0.5% Sensorcaine with epinephrine around the area of the portals for postop analgesia. The wound was then dressed with Xeroform, 4 x 4's, Webril, Ace wrap. The patient was sent to recovery in stable condition.   ESTIMATED BLOOD LOSS: Minimal.   COMPLICATIONS: None.   SPECIMEN: None.  ____________________________ Laurene Footman, MD mjm:drc D: 06/20/2012 22:07:14 ET T: 06/21/2012 12:13:23 ET JOB#: [id number]  cc: Laurene Footman, MD, <Dictator> Laurene Footman MD ELECTRONICALLY SIGNED 06/21/2012 13:49

## 2015-01-15 ENCOUNTER — Other Ambulatory Visit: Payer: Self-pay | Admitting: *Deleted

## 2015-01-27 ENCOUNTER — Encounter: Payer: Self-pay | Admitting: Family Medicine

## 2015-01-27 ENCOUNTER — Ambulatory Visit (INDEPENDENT_AMBULATORY_CARE_PROVIDER_SITE_OTHER): Payer: Commercial Managed Care - HMO | Admitting: Family Medicine

## 2015-01-27 VITALS — BP 124/84 | HR 92 | Temp 98.1°F | Wt 198.2 lb

## 2015-01-27 DIAGNOSIS — I712 Thoracic aortic aneurysm, without rupture: Secondary | ICD-10-CM

## 2015-01-27 DIAGNOSIS — Z952 Presence of prosthetic heart valve: Secondary | ICD-10-CM

## 2015-01-27 DIAGNOSIS — J209 Acute bronchitis, unspecified: Secondary | ICD-10-CM

## 2015-01-27 DIAGNOSIS — J040 Acute laryngitis: Secondary | ICD-10-CM

## 2015-01-27 DIAGNOSIS — Z954 Presence of other heart-valve replacement: Secondary | ICD-10-CM

## 2015-01-27 DIAGNOSIS — I480 Paroxysmal atrial fibrillation: Secondary | ICD-10-CM

## 2015-01-27 DIAGNOSIS — I7121 Aneurysm of the ascending aorta, without rupture: Secondary | ICD-10-CM

## 2015-01-27 DIAGNOSIS — H409 Unspecified glaucoma: Secondary | ICD-10-CM

## 2015-01-27 DIAGNOSIS — I350 Nonrheumatic aortic (valve) stenosis: Secondary | ICD-10-CM

## 2015-01-27 MED ORDER — AMOXICILLIN-POT CLAVULANATE 875-125 MG PO TABS: 1.0000 | ORAL_TABLET | Freq: Two times a day (BID) | ORAL | Status: AC

## 2015-01-27 NOTE — Assessment & Plan Note (Signed)
Now off amiodarone. Sounds regular today.

## 2015-01-27 NOTE — Assessment & Plan Note (Signed)
Sees ophtho Qyearly.

## 2015-01-27 NOTE — Progress Notes (Signed)
BP 124/84 mmHg  Pulse 92  Temp(Src) 98.1 F (36.7 C) (Oral)  Wt 198 lb 4 oz (89.926 kg)  SpO2 98%   CC: f/u visit, laryngitis  Subjective:    Patient ID: Jeremy Meadows, male    DOB: July 29, 1946, 69 y.o.   MRN: [id number]  HPI: Jeremy Meadows is a 69 y.o. male presenting on 01/27/2015 for Follow-up and Laryngitis   Here for 6 mo f/u visit. S/p recent AVR and aortic aneurysm grafting by Dr [name] 07/02/2014. Post op afib now off amiodarone. ACEI was discontinued 2/2 hacking cough.  Requests referral to cardiology and CT surgery today.  Also wants evaluation for laryngitis and cough - lost his voice 1 wk ago. Until yesterday coughing up green phlegm. Then today started coughing dark colored phlegm. 1 mo ago had another illness that slowly improved on its own. + HA and ST. Initially with fever, not currently. No congestion, rhinorrhea, ear or tooth pain, abd pain, PNDrainage. Taking cepacol spray for this. No sick contacts at home. No smokers at home.   Completed cardiac rehab, now involved in silver sneakers and tai-chi.  Relevant past medical, surgical, family and social history reviewed and updated as indicated. Interim medical history since our last visit reviewed. Allergies and medications reviewed and updated. Current Outpatient Prescriptions on File Prior to Visit  Medication Sig  . albuterol (VENTOLIN HFA) 108 (90 BASE) MCG/ACT inhaler Inhale 2 puffs into the lungs every 6 (six) hours as needed for wheezing or shortness of breath.  [name] aspirin 81 MG tablet Take 81 mg by mouth daily.  [name] atorvastatin (LIPITOR) 10 MG tablet Take 1 tablet (10 mg total) by mouth daily at 6 PM.  . brinzolamide (AZOPT) 1 % ophthalmic suspension Place 1 drop into both eyes 2 (two) times daily.  . cholecalciferol (VITAMIN D) 1000 UNITS tablet Take 1,000 Units by mouth daily.  . metoprolol tartrate (LOPRESSOR) 25 MG tablet Take 1 tablet (25 mg total) by mouth 2 (two) times daily.  . vitamin C  (ASCORBIC ACID) 500 MG tablet Take 500 mg by mouth 2 (two) times daily.   No current facility-administered medications on file prior to visit.    Review of Systems Per HPI unless specifically indicated above     Objective:    BP 124/84 mmHg  Pulse 92  Temp(Src) 98.1 F (36.7 C) (Oral)  Wt 198 lb 4 oz (89.926 kg)  SpO2 98%  Wt Readings from Last 3 Encounters:  01/27/15 198 lb 4 oz (89.926 kg)  11/28/14 202 lb 4 oz (91.74 kg)  09/30/14 194 lb 6.4 oz (88.179 kg)    Physical Exam  Constitutional: He appears well-developed and well-nourished. No distress.  HENT:  Head: Normocephalic and atraumatic.  Right Ear: Hearing, tympanic membrane, external ear and ear canal normal.  Left Ear: Hearing, tympanic membrane, external ear and ear canal normal.  Nose: Nose normal. No mucosal edema or rhinorrhea. Right sinus exhibits no maxillary sinus tenderness and no frontal sinus tenderness. Left sinus exhibits no maxillary sinus tenderness and no frontal sinus tenderness.  Mouth/Throat: Uvula is midline and mucous membranes are normal. Posterior oropharyngeal edema and posterior oropharyngeal erythema present. No oropharyngeal exudate or tonsillar abscesses.  Eyes: Conjunctivae and EOM are normal. Pupils are equal, round, and reactive to light. No scleral icterus.  Neck: Normal range of motion. Neck supple.  Cardiovascular: Normal rate, regular rhythm and intact distal pulses.   Murmur (3/6 SEM best at lUSB) heard. Pulmonary/Chest: Effort normal  and breath sounds normal. No respiratory distress. He has no wheezes. He has no rales.  Lymphadenopathy:    He has no cervical adenopathy.  Skin: Skin is warm and dry. No rash noted.  Nursing note and vitals reviewed.  Results for orders placed or performed in visit on 08/05/14  Lipid panel  Result Value Ref Range   Cholesterol 128 0 - 200 mg/dL   Triglycerides 116.0 0.0 - 149.0 mg/dL   HDL 30.40 (L) >39.00 mg/dL   VLDL 23.2 0.0 - 40.0 mg/dL    LDL Cholesterol 74 0 - 99 mg/dL   Total CHOL/HDL Ratio 4    NonHDL 97.60   TSH  Result Value Ref Range   TSH 1.86 0.35 - 4.50 uIU/mL  CBC with Differential  Result Value Ref Range   WBC 6.0 4.0 - 10.5 K/uL   RBC 4.33 4.22 - 5.81 Mil/uL   Hemoglobin 12.9 (L) 13.0 - 17.0 g/dL   HCT 40.2 39.0 - 52.0 %   MCV 92.7 78.0 - 100.0 fl   MCHC 32.0 30.0 - 36.0 g/dL   RDW 14.4 11.5 - 15.5 %   Platelets 221.0 150.0 - 400.0 K/uL   Neutrophils Relative % 59.6 43.0 - 77.0 %   Lymphocytes Relative 24.7 12.0 - 46.0 %   Monocytes Relative 13.8 (H) 3.0 - 12.0 %   Eosinophils Relative 1.3 0.0 - 5.0 %   Basophils Relative 0.6 0.0 - 3.0 %   Neutro Abs 3.6 1.4 - 7.7 K/uL   Lymphs Abs 1.5 0.7 - 4.0 K/uL   Monocytes Absolute 0.8 0.1 - 1.0 K/uL   Eosinophils Absolute 0.1 0.0 - 0.7 K/uL   Basophils Absolute 0.0 0.0 - 0.1 K/uL  Basic metabolic panel  Result Value Ref Range   Sodium 144 135 - 145 mEq/L   Potassium 4.3 3.5 - 5.1 mEq/L   Chloride 107 96 - 112 mEq/L   CO2 20 19 - 32 mEq/L   Glucose, Bld 103 (H) 70 - 99 mg/dL   BUN 20 6 - 23 mg/dL   Creatinine, Ser 1.2 0.4 - 1.5 mg/dL   Calcium 9.6 8.4 - 10.5 mg/dL   GFR 62.05 >60.00 mL/min      Assessment & Plan:   Problem List Items Addressed This Visit    Thoracic ascending aortic aneurysm   Relevant Orders   Ambulatory referral to Cardiothoracic Surgery   Ambulatory referral to Cardiology   S/P AVR (aortic valve replacement) and aortoplasty    Renewed referral to cards and CT surgery for insurance       Relevant Orders   Ambulatory referral to Cardiothoracic Surgery   Ambulatory referral to Cardiology   Glaucoma    Sees ophtho Qyearly.      Atrial fibrillation    Now off amiodarone. Sounds regular today.      Aortic valve stenosis, severe   Relevant Orders   Ambulatory referral to Cardiothoracic Surgery   Ambulatory referral to Cardiology   Acute laryngitis    See above. Likely viral.      Acute bronchitis - Primary    With  laryngitis. Anticipate viral - recommended supportive care with fluids and rest. Ok to conitnue cepacol spray. If fever >101, worsening productive cough, or not improving as expected, provided with WASP for 7d augmentin course to fill. Pt agrees with plan.          Follow up plan: Return in about 6 months (around 08/06/2015), or as needed, for medicare wellness.

## 2015-01-27 NOTE — Assessment & Plan Note (Signed)
Renewed referral to cards and CT surgery for insurance

## 2015-01-27 NOTE — Progress Notes (Signed)
Pre visit review using our clinic review tool, if applicable. No additional management support is needed unless otherwise documented below in the visit note. 

## 2015-01-27 NOTE — Assessment & Plan Note (Signed)
With laryngitis. Anticipate viral - recommended supportive care with fluids and rest. Ok to conitnue cepacol spray. If fever >101, worsening productive cough, or not improving as expected, provided with WASP for 7d augmentin course to fill. Pt agrees with plan.

## 2015-01-27 NOTE — Assessment & Plan Note (Signed)
See above. Likely viral.

## 2015-01-27 NOTE — Patient Instructions (Addendum)
Pass by [facility] or [place] office for referrals. For laryngitis and bronchitis - I think this is likely viral, give a few more days to see if gets better on its own. If not improving, fill antibiotic provided today. Good to see you today, call us with questions. Return as needed or after 08/06/2015 for physical.

## 2015-02-03 ENCOUNTER — Ambulatory Visit: Payer: Commercial Managed Care - HMO | Admitting: Family Medicine

## 2015-02-03 DIAGNOSIS — G4733 Obstructive sleep apnea (adult) (pediatric): Secondary | ICD-10-CM | POA: Diagnosis not present

## 2015-02-03 DIAGNOSIS — J209 Acute bronchitis, unspecified: Secondary | ICD-10-CM | POA: Diagnosis not present

## 2015-02-03 DIAGNOSIS — J300 Vasomotor rhinitis: Secondary | ICD-10-CM | POA: Diagnosis not present

## 2015-02-03 DIAGNOSIS — H409 Unspecified glaucoma: Secondary | ICD-10-CM | POA: Diagnosis not present

## 2015-02-20 ENCOUNTER — Ambulatory Visit
Admission: RE | Admit: 2015-02-20 | Discharge: 2015-02-20 | Disposition: A | Discharge status: Home / Self Care | Payer: Commercial Managed Care - HMO | Source: Ambulatory Visit | Attending: Cardiothoracic Surgery | Admitting: Cardiothoracic Surgery

## 2015-02-20 ENCOUNTER — Ambulatory Visit (INDEPENDENT_AMBULATORY_CARE_PROVIDER_SITE_OTHER): Payer: Commercial Managed Care - HMO | Admitting: Cardiothoracic Surgery

## 2015-02-20 ENCOUNTER — Encounter: Payer: Self-pay | Admitting: Cardiothoracic Surgery

## 2015-02-20 VITALS — BP 137/87 | HR 85 | Resp 20 | Ht 72.0 in | Wt 198.0 lb

## 2015-02-20 DIAGNOSIS — Z954 Presence of other heart-valve replacement: Secondary | ICD-10-CM | POA: Diagnosis not present

## 2015-02-20 DIAGNOSIS — R911 Solitary pulmonary nodule: Secondary | ICD-10-CM | POA: Diagnosis not present

## 2015-02-20 DIAGNOSIS — Z952 Presence of prosthetic heart valve: Secondary | ICD-10-CM

## 2015-02-20 NOTE — Progress Notes (Signed)
[address]       [place] [address]             [place] Record #[id number] Date of Birth: 01/20/1946  Referring: [name], Peter M, MD Primary Care: Ria Bush, MD  Chief Complaint:   POST OP FOLLOW UP 07/02/2014 OPERATIVE REPORT PREOPERATIVE DIAGNOSES: 1. Critical aortic stenosis. 2. Ascending aortic aneurysm. 3. 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  POSTOPERATIVE DIAGNOSES: 1. Critical aortic stenosis. 2. Ascending aortic aneurysm. 3. 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  PROCEDURE: Aortic valve replacement with [facility] pericardial tissue valve 23 mm, model 3300TFX, serial #[id number]. Supracoronary replacement of ascending aorta with Hemashield graft 32 mm with cardiopulmonary bypass and circulatory arrest. Right axillary arterial cannulation.  History of Present Illness:     Patient doing well postoperatively, he's had no evidence of congestive heart failure or angina. He's had no episodes of recurrent atrial fibrillation. Patient notes some weakness when lifting heavy objects with his right arm likely related to the cutdown and cannulation in the right subclavian artery, he denies any C8 or T1 neurologic symptoms in the right arm. He also notes that he's developed a small herniation at the lower portion of the sternal incision.  From a functional standpoint the patient notes marked improvement in his "asthma". He notes no asthma flareups since his aortic valve replacement for severe aortic stenosis.      Past Medical History  Diagnosis Date  . Unspecified glaucoma   . Allergic rhinitis, cause unspecified   . HLD (hyperlipidemia)   . Benign neoplasm of colon   . History of nephrolithiasis   . Other testicular hypofunction   . Aortic insufficiency     s/p AVR 2015  . Aortic root enlargement     thoracic aorta  . Aortic valve stenosis 2015     s/p AVR 2015 [name]) - completed cardiac rehab 08/2014  . Pulmonary nodule, left 2015    2cm, ?sarcoid by biopsy  . Hypertension   . Obstructive sleep apnea (adult) (pediatric)     pt. doesn't use his machine at home.  . Asthma   . History of kidney stones   . Arthritis   . Atrial fibrillation 07/23/2014     History  Smoking status  . Never Smoker   Smokeless tobacco  . Never Used    History  Alcohol Use No     Allergies  Allergen Reactions  . Ace Inhibitors Cough    Hacking cough    Current Outpatient Prescriptions  Medication Sig Dispense Refill  . albuterol (VENTOLIN HFA) 108 (90 BASE) MCG/ACT inhaler Inhale 2 puffs into the lungs every 6 (six) hours as needed for wheezing or shortness of breath. 1 Inhaler 0  . aspirin 81 MG tablet Take 81 mg by mouth daily.    [name] atorvastatin (LIPITOR) 10 MG tablet Take 1 tablet (10 mg total) by mouth daily at 6 PM. 90 tablet 3  . brinzolamide (AZOPT) 1 % ophthalmic suspension Place 1 drop into both eyes 2 (two) times daily.    . cholecalciferol (VITAMIN D) 1000 UNITS tablet Take 1,000 Units by mouth daily.    . metoprolol tartrate (LOPRESSOR) 25 MG tablet Take 1 tablet (25 mg total) by mouth 2 (two) times daily. 180 tablet 3  . vitamin C (ASCORBIC ACID) 500 MG tablet Take 500 mg by mouth 2 (two)  times daily.     No current facility-administered medications for this visit.       Physical Exam: BP 137/87 mmHg  Pulse 85  Resp 20  Ht 6' (1.829 m)  Wt 198 lb (89.812 kg)  BMI 26.85 kg/m2  SpO2 92%  General appearance: alert and cooperative Neurologic: intact Heart: regular rate and rhythm, S1, S2 normal, no murmur, click, rub or gallop Lungs: clear to auscultation bilaterally Abdomen: soft, non-tender; bowel sounds normal; no masses,  no organomegaly Extremities: extremities normal, atraumatic, no cyanosis or edema and Homans sign is negative, no sign of DVT Wound: Sternum is stable and well-healed Patient has a 2 cm  bulge at the lower end of the sternal incision consistent with an epigastric incisional hernia He has full strength and function in his right arm .the right infraclavicular cannulation site incisions well-healed   Diagnostic Studies & Laboratory data:     Recent Radiology Findings:    Ct Chest Wo Contrast  02/20/2015   CLINICAL DATA:  Followup left lower lobe pulmonary nodule.  EXAM: CT CHEST WITHOUT CONTRAST  TECHNIQUE: Multidetector CT imaging of the chest was performed following the standard protocol without IV contrast.  COMPARISON:  Chest CT 03/13/2014 and PET-CT 04/09/2014  FINDINGS: Chest wall: No chest wall mass, supraclavicular or axillary adenopathy. The thyroid gland is normal. The bony thorax is intact. Stable surgical changes with median sternotomy wires. Stable mid thoracic compression deformities.  Mediastinum: Surgical changes from aortic valve replacement surgery and ascending aortic graft. No complicating features are demonstrated. The descending thoracic aorta is normal in caliber. Stable scattered atherosclerotic calcifications. The esophagus is grossly normal. The heart is normal in size. No pericardial effusion. No mediastinal or hilar mass or adenopathy. The esophagus is grossly normal.  Lungs/ pleura: Knee irregular nodular density in the left lower lobe has resolved. No new or worrisome pulmonary lesions. No acute pulmonary findings. No pleural effusion.  Upper abdomen:  Stable cholelithiasis.  IMPRESSION: 1. Resolution of left lower lobe pulmonary lesion. No new or worrisome pulmonary findings. 2. Status post aortic valve replacement surgery and ascending aortic graft. No complicating features.   Electronically Signed   By: Marijo Sanes M.D.   On: 02/20/2015 14:39      Recent Lab Findings: Lab Results  Component Value Date   WBC 6.0 08/05/2014   HGB 12.9* 08/05/2014   HCT 40.2 08/05/2014   PLT 221.0 08/05/2014   GLUCOSE 103* 08/05/2014   CHOL 128 08/05/2014   TRIG  116.0 08/05/2014   HDL 30.40* 08/05/2014   LDLCALC 74 08/05/2014   ALT 22 06/28/2014   AST 30 06/28/2014   NA 144 08/05/2014   K 4.3 08/05/2014   CL 107 08/05/2014   CREATININE 1.2 08/05/2014   BUN 20 08/05/2014   CO2 20 08/05/2014   TSH 1.86 08/05/2014   INR 2.00* 07/02/2014   HGBA1C 5.7* 06/28/2014      Assessment / Plan:   Patient doing well following aortic valve replacement and replacement of ascending aorta.  On CT scan of the chest today Resolution of left lower lobe pulmonary lesion. No new or worrisome pulmonary findings. Ascending aortic repair is intact  I discussed with him the need for preprocedure antibiotics for endocarditis prophylaxis.  I've discussed with the patient the repair of the epigastric incisional hernia, he would like to wait on this. I told him should we see it increase in size or become more uncomfortable that we should proceed with surgical repair. I  will see the patient back in 3 months to check the status of his incisional hernia, if he like to proceed with repair sooner he will call.   Grace Isaac MD      [address] [place] [address] Office [phone]   Beeper [phone]  02/20/2015 3:48 PM

## 2015-02-22 ENCOUNTER — Encounter: Payer: Self-pay | Admitting: Family Medicine

## 2015-03-06 DIAGNOSIS — G4733 Obstructive sleep apnea (adult) (pediatric): Secondary | ICD-10-CM | POA: Diagnosis not present

## 2015-03-06 DIAGNOSIS — J300 Vasomotor rhinitis: Secondary | ICD-10-CM | POA: Diagnosis not present

## 2015-03-06 DIAGNOSIS — H409 Unspecified glaucoma: Secondary | ICD-10-CM | POA: Diagnosis not present

## 2015-03-06 DIAGNOSIS — J209 Acute bronchitis, unspecified: Secondary | ICD-10-CM | POA: Diagnosis not present

## 2015-04-04 ENCOUNTER — Ambulatory Visit: Payer: Commercial Managed Care - HMO | Admitting: Cardiology

## 2015-04-05 DIAGNOSIS — H409 Unspecified glaucoma: Secondary | ICD-10-CM | POA: Diagnosis not present

## 2015-04-05 DIAGNOSIS — G4733 Obstructive sleep apnea (adult) (pediatric): Secondary | ICD-10-CM | POA: Diagnosis not present

## 2015-04-05 DIAGNOSIS — J300 Vasomotor rhinitis: Secondary | ICD-10-CM | POA: Diagnosis not present

## 2015-04-05 DIAGNOSIS — J209 Acute bronchitis, unspecified: Secondary | ICD-10-CM | POA: Diagnosis not present

## 2015-04-08 ENCOUNTER — Encounter: Payer: Self-pay | Admitting: Cardiology

## 2015-04-08 ENCOUNTER — Ambulatory Visit (INDEPENDENT_AMBULATORY_CARE_PROVIDER_SITE_OTHER): Payer: Commercial Managed Care - HMO | Admitting: Cardiology

## 2015-04-08 VITALS — BP 122/86 | HR 64 | Ht 72.0 in | Wt 201.4 lb

## 2015-04-08 DIAGNOSIS — Z954 Presence of other heart-valve replacement: Secondary | ICD-10-CM

## 2015-04-08 DIAGNOSIS — I10 Essential (primary) hypertension: Secondary | ICD-10-CM

## 2015-04-08 DIAGNOSIS — I350 Nonrheumatic aortic (valve) stenosis: Secondary | ICD-10-CM | POA: Diagnosis not present

## 2015-04-08 DIAGNOSIS — I712 Thoracic aortic aneurysm, without rupture: Secondary | ICD-10-CM | POA: Diagnosis not present

## 2015-04-08 DIAGNOSIS — Z952 Presence of prosthetic heart valve: Secondary | ICD-10-CM

## 2015-04-08 DIAGNOSIS — I7121 Aneurysm of the ascending aorta, without rupture: Secondary | ICD-10-CM

## 2015-04-08 NOTE — Progress Notes (Signed)
I will  Jeremy Meadows Date of Birth: 03/15/1946 Medical Record #[id number]  History of Present Illness: Jeremy Meadows is seen for followup of aortic stenosis and  thoracic aortic aneurysm. He is s/p AVR and aortic aneurysm grafting by Dr. [name] on 07/02/14 with a #23 Magna Ease pericardial valve and 32 mm Hema shield graft. His post op course was complicated by atrial fibrillation that converted to NSR on amiodarone. Amiodarone was later discontinued and he has had no further arrhythmia. He notes some weakness in right chest with lifting. He has a residual incisional hernia and is planning to have this repaired. He is active- back playing softball. No chest pain, SOB, or palpitations. He had a follow up CT in May that showed resolution of his pulmonary nodule and good repair of the aorta.  Current Outpatient Prescriptions on File Prior to Visit  Medication Sig Dispense Refill  . albuterol (VENTOLIN HFA) 108 (90 BASE) MCG/ACT inhaler Inhale 2 puffs into the lungs every 6 (six) hours as needed for wheezing or shortness of breath. 1 Inhaler 0  . aspirin 81 MG tablet Take 81 mg by mouth daily.    [name] atorvastatin (LIPITOR) 10 MG tablet Take 1 tablet (10 mg total) by mouth daily at 6 PM. 90 tablet 3  . brinzolamide (AZOPT) 1 % ophthalmic suspension Place 1 drop into both eyes 2 (two) times daily.    . cholecalciferol (VITAMIN D) 1000 UNITS tablet Take 1,000 Units by mouth daily.    . metoprolol tartrate (LOPRESSOR) 25 MG tablet Take 1 tablet (25 mg total) by mouth 2 (two) times daily. 180 tablet 3  . vitamin C (ASCORBIC ACID) 500 MG tablet Take 500 mg by mouth 2 (two) times daily.     No current facility-administered medications on file prior to visit.    Allergies  Allergen Reactions  . Ace Inhibitors Cough    Hacking cough    Past Medical History  Diagnosis Date  . Unspecified glaucoma   . Allergic rhinitis, cause unspecified   . HLD (hyperlipidemia)   . Benign neoplasm of colon   .  History of nephrolithiasis   . Other testicular hypofunction   . Aortic insufficiency     s/p AVR 2015  . Aortic root enlargement     thoracic aorta  . Aortic valve stenosis 2015    s/p AVR 2015 [name]) - completed cardiac rehab 08/2014  . Pulmonary nodule, left 2015    2cm, ?sarcoid by biopsy  . Hypertension   . Obstructive sleep apnea (adult) (pediatric)     pt. doesn't use his machine at home.  . Asthma   . History of kidney stones   . Arthritis   . Atrial fibrillation 07/23/2014  . Need for prophylactic antibiotic     Past Surgical History  Procedure Laterality Date  . Rhinoplasty  1980s  . Knee surgery Right 05/2012    arthroscopic  . Medial partial knee replacement Right 03/2013  . Knee surgery Right 12/2013    patella  . Lithotripsy    . Lung biopsy Left 2015    by IR - benign  . Cataract extraction Bilateral   . Joint replacement Right 14    partial replacement  . Aortic valve replacement N/A 07/02/2014    Procedure: AORTIC VALVE REPLACEMENT (AVR) with 23 Aortic Magna Ease;  Surgeon: Grace Isaac, MD  . Ascending aortic root replacement N/A 07/02/2014    Procedure: supra coronary  ASCENDING AORTIC REPLACEMENT to the Inominate  Artery with 62mm Hemashield Platinum with  circulatory arrest;  Surgeon: Grace Isaac, MD  . Intraoperative transesophageal echocardiogram N/A 07/02/2014  . Colonoscopy  2013    WNL, rpt 5 yrs  . Left and right heart catheterization with coronary angiogram N/A 05/22/2014    no significant obstructive CAD ([name])    History  Smoking status  . Never Smoker   Smokeless tobacco  . Never Used    History  Alcohol Use No    Family History  Problem Relation Age of Onset  . CAD Father 37    MI  . Heart disease Brother   . Heart disease Brother   . Heart disease Brother   . Cancer Neg Hx     Review of Systems: As noted in history of present illness. All other systems were reviewed and are negative.  Physical Exam: BP  122/86 mmHg  Pulse 64  Ht 6' (1.829 m)  Wt 91.354 kg (201 lb 6.4 oz)  BMI 27.31 kg/m2 He is a pleasant white male in no acute distress. HEENT: Normal Neck is without JVD, adenopathy, thyromegaly, or bruits. Carotid upstrokes are normal. Lungs: Clear Chest: median sternotomy and right subclavicular scars have healed well. He does have an incisional hernia at the lower sternal incision. Cardiovascular: Regular rate and rhythm. Normal S1 and S2. There is a grade 1/6 systolic murmur at the left sternal border. PMI is normal. No gallop. Abdomen: Soft and nontender. No masses or bruits. No hepatosplenomegaly. Bowel sounds are positive. Extremities: No cyanosis or edema. Pulses are 2+ and symmetric. Neuro: Alert and oriented x3. Cranial nerves II through XII are intact.  LABORATORY DATA: Lab Results  Component Value Date   WBC 6.0 08/05/2014   HGB 12.9* 08/05/2014   HCT 40.2 08/05/2014   PLT 221.0 08/05/2014   GLUCOSE 103* 08/05/2014   CHOL 128 08/05/2014   TRIG 116.0 08/05/2014   HDL 30.40* 08/05/2014   LDLCALC 74 08/05/2014   ALT 22 06/28/2014   AST 30 06/28/2014   NA 144 08/05/2014   K 4.3 08/05/2014   CL 107 08/05/2014   CREATININE 1.2 08/05/2014   BUN 20 08/05/2014   CO2 20 08/05/2014   TSH 1.86 08/05/2014   PSA 1.34 11/23/2013   INR 2.00* 07/02/2014   HGBA1C 5.7* 06/28/2014   Echo: 08/02/14: Study Conclusions  - Left ventricle: The cavity size was normal. Wall thickness was normal. Systolic function was normal. The estimated ejection fraction was in the range of 60% to 65%. Features are consistent with a pseudonormal left ventricular filling pattern, with concomitant abnormal relaxation and increased filling pressure (grade 2 diastolic dysfunction). - Aortic valve: AV valve prosthesis opens well Peak and mean gradients through the valve are 17 and 10 mm Hg respectively. - Mitral valve: There was mild regurgitation. - Left atrium: The atrium was moderately  dilated.      Assessment / Plan: 1. Aortic stenosis - severe. S/p pericardial tissue valve replacement. Doing well. Echo post op showed good valve function. He has made an excellent recovery.  2. Thoracic aortic aneurysm. S/p grafting. CT showed good repair.  3. Hypertension-well controlled.   4. 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 Resolved.  5. Post op Atrial fibrillation. Resolved.   I will follow up in one year.

## 2015-04-08 NOTE — Patient Instructions (Signed)
Continue your current therapy  I will see you in one year   

## 2015-05-06 DIAGNOSIS — J209 Acute bronchitis, unspecified: Secondary | ICD-10-CM | POA: Diagnosis not present

## 2015-05-06 DIAGNOSIS — J300 Vasomotor rhinitis: Secondary | ICD-10-CM | POA: Diagnosis not present

## 2015-05-06 DIAGNOSIS — H409 Unspecified glaucoma: Secondary | ICD-10-CM | POA: Diagnosis not present

## 2015-05-06 DIAGNOSIS — G4733 Obstructive sleep apnea (adult) (pediatric): Secondary | ICD-10-CM | POA: Diagnosis not present

## 2015-05-22 ENCOUNTER — Encounter: Payer: Self-pay | Admitting: Cardiothoracic Surgery

## 2015-05-22 ENCOUNTER — Ambulatory Visit: Payer: Commercial Managed Care - HMO | Admitting: Cardiothoracic Surgery

## 2015-05-22 ENCOUNTER — Ambulatory Visit (INDEPENDENT_AMBULATORY_CARE_PROVIDER_SITE_OTHER): Payer: Commercial Managed Care - HMO | Admitting: Cardiothoracic Surgery

## 2015-05-22 VITALS — Resp 20 | Ht 72.0 in | Wt 200.0 lb

## 2015-05-22 DIAGNOSIS — K432 Incisional hernia without obstruction or gangrene: Secondary | ICD-10-CM

## 2015-05-22 DIAGNOSIS — I350 Nonrheumatic aortic (valve) stenosis: Secondary | ICD-10-CM | POA: Diagnosis not present

## 2015-05-22 DIAGNOSIS — Z952 Presence of prosthetic heart valve: Secondary | ICD-10-CM

## 2015-05-22 DIAGNOSIS — I712 Thoracic aortic aneurysm, without rupture, unspecified: Secondary | ICD-10-CM

## 2015-05-22 DIAGNOSIS — Z954 Presence of other heart-valve replacement: Secondary | ICD-10-CM | POA: Diagnosis not present

## 2015-05-22 NOTE — Progress Notes (Signed)
[address]       [place] [address]             [facility] Record #[id number] Date of Birth: 1946/07/22  Referring: [name], Peter M, MD Primary Care: Ria Bush, MD  Chief Complaint:   POST OP FOLLOW UP 07/02/2014 OPERATIVE REPORT PREOPERATIVE DIAGNOSES: 1. Critical aortic stenosis. 2. Ascending aortic aneurysm. 3. 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  POSTOPERATIVE DIAGNOSES: 1. Critical aortic stenosis. 2. Ascending aortic aneurysm. 3. 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  PROCEDURE: Aortic valve replacement with [facility] pericardial tissue valve 23 mm, model 3300TFX, serial #[id number]. Supracoronary replacement of ascending aorta with Hemashield graft 32 mm with cardiopulmonary bypass and circulatory arrest. Right axillary arterial cannulation.  History of Present Illness:     Patient doing well postoperatively, he's had no evidence of congestive heart failure or angina. He's had no episodes of recurrent atrial fibrillation.  He also notes that he's developed a small herniation at the lower portion of the sternal incision. Patient is now back to full activities and actively playing softball with the adult league. He played 3 games last Saturday without difficulty.  From a functional standpoint the patient notes marked improvement in his "asthma". He notes no asthma flareups since his aortic valve replacement for severe aortic stenosis.      Past Medical History  Diagnosis Date  . Unspecified glaucoma   . Allergic rhinitis, cause unspecified   . HLD (hyperlipidemia)   . Benign neoplasm of colon   . History of nephrolithiasis   . Other testicular hypofunction   . Aortic insufficiency     s/p AVR 2015  . Aortic root enlargement     thoracic aorta  . Aortic valve stenosis 2015    s/p AVR 2015 Jeremy Meadows) - completed cardiac rehab 08/2014  .  Pulmonary nodule, left 2015    2cm, ?sarcoid by biopsy  . Hypertension   . Obstructive sleep apnea (adult) (pediatric)     pt. doesn't use his machine at home.  . Asthma   . History of kidney stones   . Arthritis   . Atrial fibrillation 07/23/2014  . Need for prophylactic antibiotic      History  Smoking status  . Never Smoker   Smokeless tobacco  . Never Used    History  Alcohol Use No     Allergies  Allergen Reactions  . Ace Inhibitors Cough    Hacking cough    Current Outpatient Prescriptions  Medication Sig Dispense Refill  . albuterol (VENTOLIN HFA) 108 (90 BASE) MCG/ACT inhaler Inhale 2 puffs into the lungs every 6 (six) hours as needed for wheezing or shortness of breath. 1 Inhaler 0  . aspirin 81 MG tablet Take 81 mg by mouth daily.    [name] atorvastatin (LIPITOR) 10 MG tablet Take 1 tablet (10 mg total) by mouth daily at 6 PM. 90 tablet 3  . brinzolamide (AZOPT) 1 % ophthalmic suspension Place 1 drop into both eyes 2 (two) times daily.    . cholecalciferol (VITAMIN D) 1000 UNITS tablet Take 1,000 Units by mouth daily.    . metoprolol tartrate (LOPRESSOR) 25 MG tablet Take 1 tablet (25 mg total) by mouth 2 (two) times daily. 180 tablet 3  . vitamin C (ASCORBIC ACID) 500 MG tablet Take 500 mg by mouth daily.     . Vitamins  A & D (VITAMIN A & D) 10000-400 UNITS CAPS      No current facility-administered medications for this visit.       Physical Exam: Resp 20  Ht 6' (1.829 m)  Wt 200 lb (90.719 kg)  BMI 27.12 kg/m2  SpO2   General appearance: alert and cooperative Neurologic: intact Heart: regular rate and rhythm, S1, S2 normal, no murmur, click, rub or gallop Lungs: clear to auscultation bilaterally Abdomen: soft, non-tender; bowel sounds normal; no masses,  no organomegaly Extremities: extremities normal, atraumatic, no cyanosis or edema and Homans sign is negative, no sign of DVT Wound: Sternum is stable and well-healed Patient has a 2 cm bulge at the  lower end of the sternal incision consistent with an epigastric incisional hernia He has full strength and function in his right arm .the right infraclavicular cannulation site incisions well-healed  The incisional hernia at the lower sternal oh is unchanged from previous exam  Diagnostic Studies & Laboratory data:     Recent Radiology Findings:      Ct Chest Wo Contrast  02/20/2015   CLINICAL DATA:  Followup left lower lobe pulmonary nodule.  EXAM: CT CHEST WITHOUT CONTRAST  TECHNIQUE: Multidetector CT imaging of the chest was performed following the standard protocol without IV contrast.  COMPARISON:  Chest CT 03/13/2014 and PET-CT 04/09/2014  FINDINGS: Chest wall: No chest wall mass, supraclavicular or axillary adenopathy. The thyroid gland is normal. The bony thorax is intact. Stable surgical changes with median sternotomy wires. Stable mid thoracic compression deformities.  Mediastinum: Surgical changes from aortic valve replacement surgery and ascending aortic graft. No complicating features are demonstrated. The descending thoracic aorta is normal in caliber. Stable scattered atherosclerotic calcifications. The esophagus is grossly normal. The heart is normal in size. No pericardial effusion. No mediastinal or hilar mass or adenopathy. The esophagus is grossly normal.  Lungs/ pleura: Knee irregular nodular density in the left lower lobe has resolved. No new or worrisome pulmonary lesions. No acute pulmonary findings. No pleural effusion.  Upper abdomen:  Stable cholelithiasis.  IMPRESSION: 1. Resolution of left lower lobe pulmonary lesion. No new or worrisome pulmonary findings. 2. Status post aortic valve replacement surgery and ascending aortic graft. No complicating features.   Electronically Signed   By: Marijo Sanes M.D.   On: 02/20/2015 14:39      Recent Lab Findings: Lab Results  Component Value Date   WBC 6.0 08/05/2014   HGB 12.9* 08/05/2014   HCT 40.2 08/05/2014   PLT 221.0  08/05/2014   GLUCOSE 103* 08/05/2014   CHOL 128 08/05/2014   TRIG 116.0 08/05/2014   HDL 30.40* 08/05/2014   LDLCALC 74 08/05/2014   ALT 22 06/28/2014   AST 30 06/28/2014   NA 144 08/05/2014   K 4.3 08/05/2014   CL 107 08/05/2014   CREATININE 1.2 08/05/2014   BUN 20 08/05/2014   CO2 20 08/05/2014   TSH 1.86 08/05/2014   INR 2.00* 07/02/2014   HGBA1C 5.7* 06/28/2014      Assessment / Plan:     I discussed with him the need for preprocedure antibiotics for endocarditis prophylaxis.  I've discussed with the patient the repair of the epigastric incisional hernia, he would like to wait on this until after baseball season.  He will call back in the fall or winter when he wishes to proceed with repair.  Grace Isaac MD      [address] [place] [address] Office [phone]  Beeper [phone]  05/22/2015 3:46 PM

## 2015-06-06 DIAGNOSIS — G4733 Obstructive sleep apnea (adult) (pediatric): Secondary | ICD-10-CM | POA: Diagnosis not present

## 2015-06-06 DIAGNOSIS — H409 Unspecified glaucoma: Secondary | ICD-10-CM | POA: Diagnosis not present

## 2015-06-06 DIAGNOSIS — J209 Acute bronchitis, unspecified: Secondary | ICD-10-CM | POA: Diagnosis not present

## 2015-06-06 DIAGNOSIS — J300 Vasomotor rhinitis: Secondary | ICD-10-CM | POA: Diagnosis not present

## 2015-07-10 ENCOUNTER — Ambulatory Visit (INDEPENDENT_AMBULATORY_CARE_PROVIDER_SITE_OTHER): Payer: Commercial Managed Care - HMO

## 2015-07-10 DIAGNOSIS — Z23 Encounter for immunization: Secondary | ICD-10-CM

## 2015-07-28 DIAGNOSIS — H1820 Unspecified corneal edema: Secondary | ICD-10-CM | POA: Diagnosis not present

## 2015-07-31 ENCOUNTER — Ambulatory Visit (INDEPENDENT_AMBULATORY_CARE_PROVIDER_SITE_OTHER): Payer: Commercial Managed Care - HMO | Admitting: Primary Care

## 2015-07-31 ENCOUNTER — Encounter: Payer: Self-pay | Admitting: Primary Care

## 2015-07-31 VITALS — BP 140/88 | HR 64 | Temp 98.0°F | Ht 72.0 in | Wt 205.1 lb

## 2015-07-31 DIAGNOSIS — R05 Cough: Secondary | ICD-10-CM | POA: Diagnosis not present

## 2015-07-31 DIAGNOSIS — R059 Cough, unspecified: Secondary | ICD-10-CM

## 2015-07-31 MED ORDER — HYDROCODONE-HOMATROPINE 5-1.5 MG/5ML PO SYRP: 5.0000 mL | ORAL_SOLUTION | Freq: Every evening | ORAL | Status: DC | PRN

## 2015-07-31 NOTE — Progress Notes (Signed)
Pre visit review using our clinic review tool, if applicable. No additional management support is needed unless otherwise documented below in the visit note. 

## 2015-07-31 NOTE — Progress Notes (Signed)
Subjective:    Patient ID: Jeremy Meadows, male    DOB: March 06, 1946, 69 y.o.   MRN: [id number]  HPI  Jeremy Meadows is a 69 year old male who presents today with a chief complaint of cough. His cough has been present for the past 2 weeks. He woke up with sore throat on 10/17. Since then he's continued to have a non productive, dry, hacking cough, mostly at night. He denies fatigue, fevers, feeling sick. He's been taking throat lozenges, Nyquil, and cough drops with temporarily relief. He will go to bed around 11pm and wake up at 2am with a cough. He's had to use his albuterol inhaler 2 times over the past 1-2 weeks. Denies epigastric pain, reflux of gastric contents, wheezing, shortness of breath.   Review of Systems  Constitutional: Negative for fever and chills.  HENT: Positive for sore throat. Negative for congestion, ear pain, postnasal drip, rhinorrhea and sinus pressure.   Respiratory: Positive for cough. Negative for shortness of breath.   Cardiovascular: Negative for chest pain.  Musculoskeletal: Negative for myalgias.       Past Medical History  Diagnosis Date  . Unspecified glaucoma   . Allergic rhinitis, cause unspecified   . HLD (hyperlipidemia)   . Benign neoplasm of colon   . History of nephrolithiasis   . Other testicular hypofunction   . Aortic insufficiency     s/p AVR 2015  . Aortic root enlargement ([facility])     thoracic aorta  . Aortic valve stenosis 2015    s/p AVR 2015 [name]) - completed cardiac rehab 08/2014  . Pulmonary nodule, left 2015    2cm, ?sarcoid by biopsy  . Hypertension   . Obstructive sleep apnea (adult) (pediatric)     pt. doesn't use his machine at home.  . Asthma   . History of kidney stones   . Arthritis   . Atrial fibrillation ([place]) 07/23/2014  . Need for prophylactic antibiotic     Social History   Social History  . Marital Status: Married    Spouse Name: N/A  . Number of Children: 2  . Years of Education: N/A    Occupational History  . manager    Social History Main Topics  . Smoking status: Never Smoker   . Smokeless tobacco: Never Used  . Alcohol Use: No  . Drug Use: No  . Sexual Activity: Not on file   Other Topics Concern  . Not on file   Social History Narrative   Lives with [name]   Occupation: retired, was in Conservator, 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   Activity: softball, walking   Diet: good water, fruits/vegetables daily    Past Surgical History  Procedure Laterality Date  . Rhinoplasty  1980s  . Knee surgery Right 05/2012    arthroscopic  . Medial partial knee replacement Right 03/2013  . Knee surgery Right 12/2013    patella  . Lithotripsy    . Lung biopsy Left 2015    by IR - benign  . Cataract extraction Bilateral   . Joint replacement Right 14    partial replacement  . Aortic valve replacement N/A 07/02/2014    Procedure: AORTIC VALVE REPLACEMENT (AVR) with 23 Aortic Magna Ease;  Surgeon: Grace Isaac, MD  . Ascending aortic root replacement N/A 07/02/2014    Procedure: supra coronary  ASCENDING AORTIC REPLACEMENT to the Inominate Artery with 26mm Hemashield Platinum with  circulatory arrest;  Surgeon: Grace Isaac, MD  . Intraoperative transesophageal echocardiogram  N/A 07/02/2014  . Colonoscopy  2013    WNL, rpt 5 yrs  . Left and right heart catheterization with coronary angiogram N/A 05/22/2014    no significant obstructive CAD ([name])    Family History  Problem Relation Age of Onset  . CAD Father 38    MI  . Heart disease Brother   . Heart disease Brother   . Heart disease Brother   . Cancer Neg Hx     Allergies  Allergen Reactions  . Ace Inhibitors Cough    Hacking cough    Current Outpatient Prescriptions on File Prior to Visit  Medication Sig Dispense Refill  . albuterol (VENTOLIN HFA) 108 (90 BASE) MCG/ACT inhaler Inhale 2 puffs into the lungs every 6 (six) hours as needed for wheezing or shortness of breath. 1 Inhaler 0  . aspirin 81 MG tablet Take 81  mg by mouth daily.    [name] atorvastatin (LIPITOR) 10 MG tablet Take 1 tablet (10 mg total) by mouth daily at 6 PM. 90 tablet 3  . brinzolamide (AZOPT) 1 % ophthalmic suspension Place 1 drop into both eyes 2 (two) times daily.    . cholecalciferol (VITAMIN D) 1000 UNITS tablet Take 1,000 Units by mouth daily.    . metoprolol tartrate (LOPRESSOR) 25 MG tablet Take 1 tablet (25 mg total) by mouth 2 (two) times daily. 180 tablet 3  . vitamin C (ASCORBIC ACID) 500 MG tablet Take 500 mg by mouth daily.     . Vitamins A & D (VITAMIN A & D) 10000-400 UNITS CAPS      No current facility-administered medications on file prior to visit.    BP 140/88 mmHg  Pulse 64  Temp(Src) 98 F (36.7 C) (Oral)  Ht 6' (1.829 m)  Wt 205 lb 1.9 oz (93.042 kg)  BMI 27.81 kg/m2  SpO2 98%    Objective:   Physical Exam  Constitutional: He appears well-nourished.  HENT:  Right Ear: Tympanic membrane and ear canal normal.  Left Ear: Tympanic membrane and ear canal normal.  Nose: Nose normal. Right sinus exhibits no maxillary sinus tenderness and no frontal sinus tenderness. Left sinus exhibits no maxillary sinus tenderness and no frontal sinus tenderness.  Mouth/Throat: Oropharynx is clear and moist.  Eyes: Conjunctivae are normal. Pupils are equal, round, and reactive to light.  Neck: Neck supple.  Cardiovascular: Normal rate and regular rhythm.   Pulmonary/Chest: Effort normal and breath sounds normal. He has no wheezes. He has no rales.  Lymphadenopathy:    He has no cervical adenopathy.  Skin: Skin is warm and dry.          Assessment & Plan:  Cough:  History of cough x 2 weeks. Dry, hacking, mostly at night.  Currently not managed on ACE (allergy), does not have symptoms of GERD or asthma, underwent surgery to repair aortic valve.  Exam unremarkable, no concerns for pneumonia, and does not appear sickly or ill. Will provide RX for Hycodan for cough. He is to follow up with PCP in 2 weeks for  physical and is to notify PCP at that time if cough continues.  Discussed supportive treatment.

## 2015-07-31 NOTE — Patient Instructions (Signed)
You may take Hycodan cough syrup at bedtime as needed for cough and rest. You may take Delsum or Robitussin for any daytime cough. This may be purchased OTC.  Please call me if you develop fevers, fatigue, weakness, and if your cough doesn't improve in 1 week.  It was a pleasure meeting you!

## 2015-08-03 ENCOUNTER — Other Ambulatory Visit: Payer: Self-pay | Admitting: Family Medicine

## 2015-08-03 DIAGNOSIS — Z125 Encounter for screening for malignant neoplasm of prostate: Secondary | ICD-10-CM

## 2015-08-03 DIAGNOSIS — E7800 Pure hypercholesterolemia, unspecified: Secondary | ICD-10-CM

## 2015-08-03 DIAGNOSIS — R7303 Prediabetes: Secondary | ICD-10-CM

## 2015-08-03 DIAGNOSIS — I10 Essential (primary) hypertension: Secondary | ICD-10-CM

## 2015-08-04 ENCOUNTER — Encounter: Payer: Self-pay | Admitting: Cardiology

## 2015-08-05 ENCOUNTER — Other Ambulatory Visit (INDEPENDENT_AMBULATORY_CARE_PROVIDER_SITE_OTHER): Payer: Commercial Managed Care - HMO

## 2015-08-05 ENCOUNTER — Other Ambulatory Visit: Payer: Self-pay

## 2015-08-05 DIAGNOSIS — I10 Essential (primary) hypertension: Secondary | ICD-10-CM

## 2015-08-05 DIAGNOSIS — R7303 Prediabetes: Secondary | ICD-10-CM | POA: Diagnosis not present

## 2015-08-05 DIAGNOSIS — Z125 Encounter for screening for malignant neoplasm of prostate: Secondary | ICD-10-CM

## 2015-08-05 DIAGNOSIS — E7800 Pure hypercholesterolemia, unspecified: Secondary | ICD-10-CM | POA: Diagnosis not present

## 2015-08-05 LAB — LIPID PANEL
CHOL/HDL RATIO: 4
Cholesterol: 118 mg/dL (ref 0–200)
HDL: 27.8 mg/dL — ABNORMAL LOW (ref >39.00)
LDL Cholesterol: 61 mg/dL (ref 0–99)
NonHDL: 90.68
Triglycerides: 150 mg/dL — ABNORMAL HIGH (ref 0.0–149.0)
VLDL: 30 mg/dL (ref 0.0–40.0)

## 2015-08-05 LAB — BASIC METABOLIC PANEL
BUN: 19 mg/dL (ref 6–23)
CALCIUM: 9.5 mg/dL (ref 8.4–10.5)
CO2: 28 mEq/L (ref 19–32)
Chloride: 106 mEq/L (ref 96–112)
Creatinine, Ser: 1.15 mg/dL (ref 0.40–1.50)
GFR: 66.86 mL/min (ref >60.00)
Glucose, Bld: 101 mg/dL — ABNORMAL HIGH (ref 70–99)
Potassium: 4.5 mEq/L (ref 3.5–5.1)
SODIUM: 143 meq/L (ref 135–145)

## 2015-08-05 LAB — PSA, MEDICARE: PSA: 1.07 ng/mL (ref 0.10–4.00)

## 2015-08-05 LAB — HEMOGLOBIN A1C: Hgb A1c MFr Bld: 5.7 % (ref 4.6–6.5)

## 2015-08-05 MED ORDER — METOPROLOL TARTRATE 25 MG PO TABS: 25.0000 mg | ORAL_TABLET | Freq: Two times a day (BID) | ORAL | Status: DC

## 2015-08-05 MED ORDER — ATORVASTATIN CALCIUM 10 MG PO TABS: 10.0000 mg | ORAL_TABLET | Freq: Every day | ORAL | Status: DC

## 2015-08-07 ENCOUNTER — Other Ambulatory Visit: Payer: Self-pay | Admitting: Cardiology

## 2015-08-07 MED ORDER — ATORVASTATIN CALCIUM 10 MG PO TABS: 10.0000 mg | ORAL_TABLET | Freq: Every day | ORAL | Status: DC

## 2015-08-07 MED ORDER — METOPROLOL TARTRATE 25 MG PO TABS: 25.0000 mg | ORAL_TABLET | Freq: Two times a day (BID) | ORAL | Status: DC

## 2015-08-07 NOTE — Telephone Encounter (Signed)
Pt called requesting that his refill be sent to [facility], instead of [facility]. Refills were resent to the right pharmacy. I advised the pt that if he has any other problems, questions or concerns to call our office. Pt verbalized understanding.

## 2015-08-12 ENCOUNTER — Encounter: Payer: Self-pay | Admitting: Family Medicine

## 2015-08-12 ENCOUNTER — Other Ambulatory Visit: Payer: Self-pay | Admitting: *Deleted

## 2015-08-12 ENCOUNTER — Ambulatory Visit (INDEPENDENT_AMBULATORY_CARE_PROVIDER_SITE_OTHER): Payer: Commercial Managed Care - HMO | Admitting: Family Medicine

## 2015-08-12 VITALS — BP 136/92 | HR 72 | Temp 97.5°F | Ht 71.75 in | Wt 208.8 lb

## 2015-08-12 DIAGNOSIS — K436 Other and unspecified ventral hernia with obstruction, without gangrene: Secondary | ICD-10-CM

## 2015-08-12 DIAGNOSIS — I480 Paroxysmal atrial fibrillation: Secondary | ICD-10-CM

## 2015-08-12 DIAGNOSIS — R05 Cough: Secondary | ICD-10-CM | POA: Insufficient documentation

## 2015-08-12 DIAGNOSIS — Z7189 Other specified counseling: Secondary | ICD-10-CM

## 2015-08-12 DIAGNOSIS — Z0000 Encounter for general adult medical examination without abnormal findings: Secondary | ICD-10-CM | POA: Insufficient documentation

## 2015-08-12 DIAGNOSIS — I7121 Aneurysm of the ascending aorta, without rupture: Secondary | ICD-10-CM

## 2015-08-12 DIAGNOSIS — I10 Essential (primary) hypertension: Secondary | ICD-10-CM

## 2015-08-12 DIAGNOSIS — R059 Cough, unspecified: Secondary | ICD-10-CM

## 2015-08-12 DIAGNOSIS — R7303 Prediabetes: Secondary | ICD-10-CM

## 2015-08-12 DIAGNOSIS — I712 Thoracic aortic aneurysm, without rupture: Secondary | ICD-10-CM

## 2015-08-12 DIAGNOSIS — D869 Sarcoidosis, unspecified: Secondary | ICD-10-CM

## 2015-08-12 DIAGNOSIS — E7800 Pure hypercholesterolemia, unspecified: Secondary | ICD-10-CM

## 2015-08-12 DIAGNOSIS — R051 Acute cough: Secondary | ICD-10-CM | POA: Insufficient documentation

## 2015-08-12 MED ORDER — HYDROCODONE-HOMATROPINE 5-1.5 MG/5ML PO SYRP: 5.0000 mL | ORAL_SOLUTION | Freq: Every evening | ORAL | Status: DC | PRN

## 2015-08-12 NOTE — Assessment & Plan Note (Signed)
Preventative protocols reviewed and updated unless pt declined. Discussed healthy diet and lifestyle.  

## 2015-08-12 NOTE — Assessment & Plan Note (Signed)

## 2015-08-12 NOTE — Assessment & Plan Note (Signed)
S/p repair last year.

## 2015-08-12 NOTE — Progress Notes (Signed)
BP 136/92 mmHg  Pulse 72  Temp(Src) 97.5 F (36.4 C) (Oral)  Ht 5' 11.75" (1.822 m)  Wt 208 lb 12 oz (94.688 kg)  BMI 28.52 kg/m2   CC: medicare wellness visit  Subjective:    Patient ID: Jeremy Meadows, male    DOB: Nov 14, 1945, 69 y.o.   MRN: [id number]  HPI: Jeremy Meadows is a 69 y.o. male presenting on 08/12/2015 for Annual Exam   S/p AVR and aortic aneurysm grafting by Dr [name] 07/02/2014. Post op afib treated with amiodarone. 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   Seen 07/31/2015 by Allie Bossier with cough, treated with hycodan cough syrup. Persistent dry hacking cough. Treating with nyquil and OTC cough syrup which helped. No phlegm, fevers/chills, dyspnea or chest pain. No pleurisy. This started after flu shot. Started with sore throat. Worse wheezing at night time. No sick contacts at home. Has upcoming hernia repair scheduled in 2 weeks. Rare albuterol use. 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  Passes hearing screen Vision screen with eye doctor 06/2015 Denies depression,anhedonia, falls   Preventative: COLONOSCOPY Date: 2013 WNL, rpt 5 yrs  Prostate cancer screening - h/o BPH followed by urology ([place]) and PSA checked there normal 02/2015. Flu shot yearly Pneumovax 2013, prevnar 07/2014 Tdap 11/2011 zostavax - 08/2014 Advanced directives: living will at home, [name] is [place]. Asked to bring me a copy for chart. Seat belt use discussed Sunscreen use discussed. No changing moles on skin.   Lives with [name] Occupation: retired, was in [employer], 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 Activity: softball, walking Diet: good water, fruits/vegetables daily  Relevant past medical, surgical, family and social history reviewed and updated as indicated. Interim medical history since our last visit reviewed. Allergies and medications reviewed and updated. Current Outpatient Prescriptions on File Prior to Visit  Medication Sig  . albuterol (VENTOLIN HFA) 108  (90 BASE) MCG/ACT inhaler Inhale 2 puffs into the lungs every 6 (six) hours as needed for wheezing or shortness of breath.  [name] aspirin 81 MG tablet Take 81 mg by mouth daily.  [name] atorvastatin (LIPITOR) 10 MG tablet Take 1 tablet (10 mg total) by mouth daily at 6 PM.  . brinzolamide (AZOPT) 1 % ophthalmic suspension Place 1 drop into both eyes 2 (two) times daily.  . cholecalciferol (VITAMIN D) 1000 UNITS tablet Take 1,000 Units by mouth daily.  . metoprolol tartrate (LOPRESSOR) 25 MG tablet Take 1 tablet (25 mg total) by mouth 2 (two) times daily.  . vitamin C (ASCORBIC ACID) 500 MG tablet Take 500 mg by mouth daily.    No current facility-administered medications on file prior to visit.    Review of Systems  Constitutional: Negative for fever, chills, activity change, appetite change, fatigue and unexpected weight change.  HENT: Positive for congestion (rare). Negative for hearing loss and rhinorrhea.   Eyes: Positive for visual disturbance.  Respiratory: Positive for cough. Negative for chest tightness, shortness of breath and wheezing.   Cardiovascular: Negative for chest pain, palpitations and leg swelling.  Gastrointestinal: Negative for nausea, vomiting, abdominal pain, diarrhea, constipation, blood in stool and abdominal distention.  Genitourinary: Negative for hematuria and difficulty urinating.  Musculoskeletal: Negative for myalgias, arthralgias and neck pain.  Skin: Negative for rash.  Neurological: Positive for headaches. Negative for dizziness, seizures and syncope.  Hematological: Negative for adenopathy. Does not bruise/bleed easily.  Psychiatric/Behavioral: Negative for dysphoric mood. The patient is not nervous/anxious.    Per HPI unless specifically indicated in ROS section  Objective:    BP 136/92 mmHg  Pulse 72  Temp(Src) 97.5 F (36.4 C) (Oral)  Ht 5' 11.75" (1.822 m)  Wt 208 lb 12 oz (94.688 kg)  BMI 28.52 kg/m2  Wt Readings from Last 3 Encounters:    08/12/15 208 lb 12 oz (94.688 kg)  07/31/15 205 lb 1.9 oz (93.042 kg)  05/22/15 200 lb (90.719 kg)    Physical Exam  Constitutional: He is oriented to person, place, and time. He appears well-developed and well-nourished. No distress.  HENT:  Head: Normocephalic and atraumatic.  Right Ear: Hearing, tympanic membrane, external ear and ear canal normal.  Left Ear: Hearing, tympanic membrane, external ear and ear canal normal.  Nose: Nose normal. No mucosal edema or rhinorrhea.  Mouth/Throat: Uvula is midline, oropharynx is clear and moist and mucous membranes are normal. No oropharyngeal exudate, posterior oropharyngeal edema or posterior oropharyngeal erythema.  Eyes: Conjunctivae and EOM are normal. Pupils are equal, round, and reactive to light. No scleral icterus.  Neck: Normal range of motion. Neck supple. Carotid bruit is not present. No thyromegaly present.  Cardiovascular: Normal rate, regular rhythm, normal heart sounds and intact distal pulses.   No murmur heard. Pulses:      Radial pulses are 2+ on the right side, and 2+ on the left side.  Pulmonary/Chest: Effort normal and breath sounds normal. No respiratory distress. He has no wheezes. He has no rales.  Lungs clear, no cough evident  Abdominal: Soft. Bowel sounds are normal. He exhibits no distension and no mass. There is no tenderness. There is no rebound and no guarding.  Musculoskeletal: Normal range of motion. He exhibits no edema.  Lymphadenopathy:    He has no cervical adenopathy.  Neurological: He is alert and oriented to person, place, and time.  CN grossly intact, station and gait intact  Skin: Skin is warm and dry. No rash noted.  Psychiatric: He has a normal mood and affect. His behavior is normal. Judgment and thought content normal.  Nursing note and vitals reviewed.  Results for orders placed or performed in visit on 08/05/15  Lipid panel  Result Value Ref Range   Cholesterol 118 0 - 200 mg/dL    Triglycerides 150.0 (H) 0.0 - 149.0 mg/dL   HDL 27.80 (L) >39.00 mg/dL   VLDL 30.0 0.0 - 40.0 mg/dL   LDL Cholesterol 61 0 - 99 mg/dL   Total CHOL/HDL Ratio 4    NonHDL 0000000   Basic metabolic panel  Result Value Ref Range   Sodium 143 135 - 145 mEq/L   Potassium 4.5 3.5 - 5.1 mEq/L   Chloride 106 96 - 112 mEq/L   CO2 28 19 - 32 mEq/L   Glucose, Bld 101 (H) 70 - 99 mg/dL   BUN 19 6 - 23 mg/dL   Creatinine, Ser 1.15 0.40 - 1.50 mg/dL   Calcium 9.5 8.4 - 10.5 mg/dL   GFR 66.86 >60.00 mL/min  Hemoglobin A1c  Result Value Ref Range   Hgb A1c MFr Bld 5.7 4.6 - 6.5 %  PSA, Medicare  Result Value Ref Range   PSA 1.07 0.10 - 4.00 ng/ml      Assessment & Plan:  Hep C screen next lab Problem List Items Addressed This Visit    Thoracic ascending aortic aneurysm ([place])    S/p repair last year.      Sarcoidosis ([place])    Suspected by biopsy 2015.      Prediabetes    A1c stable. Reviewed  with patient.      Medicare annual wellness visit, subsequent - Primary    I have personally reviewed the Medicare Annual Wellness questionnaire and have noted 1. The patient's medical and social history 2. Their use of alcohol, tobacco or illicit drugs 3. Their current medications and supplements 4. The patient's functional ability including ADL's, fall risks, home safety risks and hearing or visual impairment. Cognitive function has been assessed and addressed as indicated.  5. Diet and physical activity 6. Evidence for depression or mood disorders The patients weight, height, BMI have been recorded in the chart. I have made referrals, counseling and provided education to the patient based on review of the above and I have provided the pt with a written personalized care plan for preventive services. Provider list updated.. See scanned questionairre as needed for further documentation. Reviewed preventative protocols and updated unless pt declined.       HYPERCHOLESTEROLEMIA    Chronic,  stable. Reviewed FLP. Continue low dose lipitor.      HTN (hypertension)    Continue metoprolol.      Health maintenance examination    Preventative protocols reviewed and updated unless pt declined. Discussed healthy diet and lifestyle.       Cough    Anticipate post infectious cough - no evidence of bacterial/viral infection, GERD or allergies. Supportive care discussed. Refilled hycodan cough syrup. Update in 1 wk if no improvement noted.      Relevant Medications   HYDROcodone-homatropine (HYCODAN) 5-1.5 MG/5ML syrup   Atrial fibrillation ([place])    This has resolved. Off amiodarone      Advanced care planning/counseling discussion    Advanced directives: living will at home, [name] is [place]. Asked to bring me a copy for chart.          Follow up plan: Return in about 6 months (around 02/09/2016), or as needed, for follow up visit.

## 2015-08-12 NOTE — Assessment & Plan Note (Signed)
This has resolved. Off amiodarone

## 2015-08-12 NOTE — Assessment & Plan Note (Signed)
Suspected by biopsy 2015.

## 2015-08-12 NOTE — Patient Instructions (Addendum)
Bring me copy of living will.  Continue hycodan cough syrup. Refilled today. Update me with effect in 1 wk.  Return in 6 months for follow up visit.  Health Maintenance, Male A healthy lifestyle and preventative care can promote health and wellness.  Maintain regular health, dental, and eye exams.  Eat a healthy diet. Foods like vegetables, fruits, whole grains, low-fat dairy products, and lean protein foods contain the nutrients you need and are low in calories. Decrease your intake of foods high in solid fats, added sugars, and salt. Get information about a proper diet from your health care provider, if necessary.  Regular physical exercise is one of the most important things you can do for your health. Most adults should get at least 150 minutes of moderate-intensity exercise (any activity that increases your heart rate and causes you to sweat) each week. In addition, most adults need muscle-strengthening exercises on 2 or more days a week.   Maintain a healthy weight. The body mass index (BMI) is a screening tool to identify possible weight problems. It provides an estimate of body fat based on height and weight. Your health care provider can find your BMI and can help you achieve or maintain a healthy weight. For males 20 years and older:  A BMI below 18.5 is considered underweight.  A BMI of 18.5 to 24.9 is normal.  A BMI of 25 to 29.9 is considered overweight.  A BMI of 30 and above is considered obese.  Maintain normal blood lipids and cholesterol by exercising and minimizing your intake of saturated fat. Eat a balanced diet with plenty of fruits and vegetables. Blood tests for lipids and cholesterol should begin at age 28 and be repeated every 5 years. If your lipid or cholesterol levels are high, you are over age 46, or you are at high risk for heart disease, you may need your cholesterol levels checked more frequently.Ongoing high lipid and cholesterol levels should be treated  with medicines if diet and exercise are not working.  If you smoke, find out from your health care provider how to quit. If you do not use tobacco, do not start.  Lung cancer screening is recommended for adults aged 65-80 years who are at high risk for developing lung cancer because of a history of smoking. A yearly low-dose CT scan of the lungs is recommended for people who have at least a 30-pack-year history of smoking and are current smokers or have quit within the past 15 years. A pack year of smoking is smoking an average of 1 pack of cigarettes a day for 1 year (for example, a 30-pack-year history of smoking could mean smoking 1 pack a day for 30 years or 2 packs a day for 15 years). Yearly screening should continue until the smoker has stopped smoking for at least 15 years. Yearly screening should be stopped for people who develop a health problem that would prevent them from having lung cancer treatment.  If you choose to drink alcohol, do not have more than 2 drinks per day. One drink is considered to be 12 oz (360 mL) of beer, 5 oz (150 mL) of wine, or 1.5 oz (45 mL) of liquor.  Avoid the use of street drugs. Do not share needles with anyone. Ask for help if you need support or instructions about stopping the use of drugs.  High blood pressure causes heart disease and increases the risk of stroke. High blood pressure is more likely to develop in:  People who have blood pressure in the end of the normal range (100-139/85-89 mm Hg).  People who are overweight or obese.  People who are African American.  If you are 97-54 years of age, have your blood pressure checked every 3-5 years. If you are 52 years of age or older, have your blood pressure checked every year. You should have your blood pressure measured twice--once when you are at a hospital or clinic, and once when you are not at a hospital or clinic. Record the average of the two measurements. To check your blood pressure when you  are not at a hospital or clinic, you can use:  An automated blood pressure machine at a pharmacy.  A home blood pressure monitor.  If you are 90-24 years old, ask your health care provider if you should take aspirin to prevent heart disease.  Diabetes screening involves taking a blood sample to check your fasting blood sugar level. This should be done once every 3 years after age 30 if you are at a normal weight and without risk factors for diabetes. Testing should be considered at a younger age or be carried out more frequently if you are overweight and have at least 1 risk factor for diabetes.  Colorectal cancer can be detected and often prevented. Most routine colorectal cancer screening begins at the age of 69 and continues through age 39. However, your health care provider may recommend screening at an earlier age if you have risk factors for colon cancer. On a yearly basis, your health care provider may provide home test kits to check for hidden blood in the stool. A small camera at the end of a tube may be used to directly examine the colon (sigmoidoscopy or colonoscopy) to detect the earliest forms of colorectal cancer. Talk to your health care provider about this at age 39 when routine screening begins. A direct exam of the colon should be repeated every 5-10 years through age 63, unless early forms of precancerous polyps or small growths are found.  People who are at an increased risk for hepatitis B should be screened for this virus. You are considered at high risk for hepatitis B if:  You were born in a country where hepatitis B occurs often. Talk with your health care provider about which countries are considered high risk.  Your parents were born in a high-risk country and you have not received a shot to protect against hepatitis B (hepatitis B vaccine).  You have HIV or AIDS.  You use needles to inject street drugs.  You live with, or have sex with, someone who has hepatitis  B.  You are a man who has sex with other men (MSM).  You get hemodialysis treatment.  You take certain medicines for conditions like cancer, organ transplantation, and autoimmune conditions.  Hepatitis C blood testing is recommended for all people born from [birth year] through 1965 and any individual with known risk factors for hepatitis C.  Healthy men should no longer receive prostate-specific antigen (PSA) blood tests as part of routine cancer screening. Talk to your health care provider about prostate cancer screening.  Testicular cancer screening is not recommended for adolescents or adult males who have no symptoms. Screening includes self-exam, a health care provider exam, and other screening tests. Consult with your health care provider about any symptoms you have or any concerns you have about testicular cancer.  Practice safe sex. Use condoms and avoid high-risk sexual practices to reduce the spread of  sexually transmitted infections (STIs).  You should be screened for STIs, including gonorrhea and chlamydia if:  You are sexually active and are younger than 24 years.  You are older than 24 years, and your health care provider tells you that you are at risk for this type of infection.  Your sexual activity has changed since you were last screened, and you are at an increased risk for chlamydia or gonorrhea. Ask your health care provider if you are at risk.  If you are at risk of being infected with HIV, it is recommended that you take a prescription medicine daily to prevent HIV infection. This is called pre-exposure prophylaxis (PrEP). You are considered at risk if:  You are a man who has sex with other men (MSM).  You are a heterosexual man who is sexually active with multiple partners.  You take drugs by injection.  You are sexually active with a partner who has HIV.  Talk with your health care provider about whether you are at high risk of being infected with HIV. If you  choose to begin PrEP, you should first be tested for HIV. You should then be tested every 3 months for as long as you are taking PrEP.  Use sunscreen. Apply sunscreen liberally and repeatedly throughout the day. You should seek shade when your shadow is shorter than you. Protect yourself by wearing long sleeves, pants, a wide-brimmed hat, and sunglasses year round whenever you are outdoors.  Tell your health care provider of new moles or changes in moles, especially if there is a change in shape or color. Also, tell your health care provider if a mole is larger than the size of a pencil eraser.  A one-time screening for abdominal aortic aneurysm (AAA) and surgical repair of large AAAs by ultrasound is recommended for men aged 68-75 years who are current or former smokers.  Stay current with your vaccines (immunizations).   This information is not intended to replace advice given to you by your health care provider. Make sure you discuss any questions you have with your health care provider.   Document Released: 03/11/2008 Document Revised: 10/04/2014 Document Reviewed: 02/08/2011 Elsevier Interactive Patient Education [employer].

## 2015-08-12 NOTE — Assessment & Plan Note (Signed)
A1c stable. Reviewed with patient.  

## 2015-08-12 NOTE — Assessment & Plan Note (Signed)
Advanced directives: living will at home, wife Joann is [place]. Asked to bring me a copy for chart. 

## 2015-08-12 NOTE — Assessment & Plan Note (Signed)
Chronic, stable. Reviewed FLP. Continue low dose lipitor.

## 2015-08-12 NOTE — Assessment & Plan Note (Signed)
Continue metoprolol. 

## 2015-08-12 NOTE — Assessment & Plan Note (Signed)
Anticipate post infectious cough - no evidence of bacterial/viral infection, GERD or allergies. Supportive care discussed. Refilled hycodan cough syrup. Update in 1 wk if no improvement noted.

## 2015-08-12 NOTE — Progress Notes (Signed)
Pre visit review using our clinic review tool, if applicable. No additional management support is needed unless otherwise documented below in the visit note. 

## 2015-08-14 ENCOUNTER — Encounter (HOSPITAL_COMMUNITY)
Admission: RE | Admit: 2015-08-14 | Discharge: 2015-08-14 | Disposition: A | Discharge status: Home / Self Care | Payer: Commercial Managed Care - HMO | Source: Ambulatory Visit | Attending: Cardiothoracic Surgery | Admitting: Cardiothoracic Surgery

## 2015-08-14 ENCOUNTER — Other Ambulatory Visit: Payer: Self-pay

## 2015-08-14 ENCOUNTER — Encounter (HOSPITAL_COMMUNITY): Payer: Self-pay

## 2015-08-14 VITALS — BP 141/85 | HR 72 | Temp 98.3°F | Resp 20 | Ht 71.0 in | Wt 210.1 lb

## 2015-08-14 DIAGNOSIS — K436 Other and unspecified ventral hernia with obstruction, without gangrene: Secondary | ICD-10-CM

## 2015-08-14 DIAGNOSIS — Z01812 Encounter for preprocedural laboratory examination: Secondary | ICD-10-CM | POA: Diagnosis not present

## 2015-08-14 LAB — BLOOD GAS, ARTERIAL
Acid-base deficit: 1 mmol/L (ref 0.0–2.0)
Bicarbonate: 23 mEq/L (ref 20.0–24.0)
Drawn by: 206361
FIO2: 0.21
O2 Saturation: 97.2 %
Patient temperature: 98.6
TCO2: 24.2 mmol/L (ref 0–100)
pCO2 arterial: 37.5 mmHg (ref 35.0–45.0)
pH, Arterial: 7.405 (ref 7.350–7.450)
pO2, Arterial: 87.1 mmHg (ref 80.0–100.0)

## 2015-08-14 LAB — COMPREHENSIVE METABOLIC PANEL
ALT: 50 U/L (ref 17–63)
AST: 47 U/L — ABNORMAL HIGH (ref 15–41)
Albumin: 3.7 g/dL (ref 3.5–5.0)
Alkaline Phosphatase: 65 U/L (ref 38–126)
Anion gap: 10 (ref 5–15)
BUN: 17 mg/dL (ref 6–20)
CO2: 20 mmol/L — ABNORMAL LOW (ref 22–32)
Calcium: 9.3 mg/dL (ref 8.9–10.3)
Chloride: 111 mmol/L (ref 101–111)
Creatinine, Ser: 1.13 mg/dL (ref 0.61–1.24)
GFR calc Af Amer: >60 mL/min (ref >60)
GFR calc non Af Amer: >60 mL/min (ref >60)
Glucose, Bld: 93 mg/dL (ref 65–99)
Potassium: 4.3 mmol/L (ref 3.5–5.1)
Sodium: 141 mmol/L (ref 135–145)
Total Bilirubin: 0.6 mg/dL (ref 0.3–1.2)
Total Protein: 6.5 g/dL (ref 6.5–8.1)

## 2015-08-14 LAB — URINALYSIS, ROUTINE W REFLEX MICROSCOPIC
Bilirubin Urine: NEGATIVE
Glucose, UA: NEGATIVE mg/dL
Hgb urine dipstick: NEGATIVE
Ketones, ur: 15 mg/dL — AB
Leukocytes, UA: NEGATIVE
Nitrite: NEGATIVE
Protein, ur: NEGATIVE mg/dL
Specific Gravity, Urine: 1.028 (ref 1.005–1.030)
pH: 5 (ref 5.0–8.0)

## 2015-08-14 LAB — PROTIME-INR
INR: 0.99 (ref 0.00–1.49)
Prothrombin Time: 13.3 seconds (ref 11.6–15.2)

## 2015-08-14 LAB — APTT: aPTT: 28 seconds (ref 24–37)

## 2015-08-14 LAB — TYPE AND SCREEN
ABO/RH(D): O POS
Antibody Screen: NEGATIVE

## 2015-08-14 LAB — CBC
HCT: 46.4 % (ref 39.0–52.0)
Hemoglobin: 15.8 g/dL (ref 13.0–17.0)
MCH: 32.1 pg (ref 26.0–34.0)
MCHC: 34.1 g/dL (ref 30.0–36.0)
MCV: 94.3 fL (ref 78.0–100.0)
Platelets: 143 10*3/uL — ABNORMAL LOW (ref 150–400)
RBC: 4.92 MIL/uL (ref 4.22–5.81)
RDW: 12.9 % (ref 11.5–15.5)
WBC: 7.1 10*3/uL (ref 4.0–10.5)

## 2015-08-14 LAB — SURGICAL PCR SCREEN
MRSA, PCR: NEGATIVE
Staphylococcus aureus: POSITIVE — AB

## 2015-08-14 NOTE — Pre-Procedure Instructions (Signed)
    Jeremy Meadows  08/14/2015      [id number] - [place], [place] [address] [place] [address] Phone: [phone] Fax: [phone]  [place], [address] [place] [address] Phone: [phone] Fax: [phone]  [facility] [address], [place] [address] Phone: [phone] Fax: [phone]    Your procedure is scheduled on 08/26/15.  Report to [facility] Admitting at 530 A.M.  Call this number if you have problems the morning of surgery:  [phone]   Remember:  Do not eat food or drink liquids after midnight.  Take these medicines the morning of surgery with A SIP OF WATER --all inhalers,eye drops,hydrocodone,metoprolol   Do not wear jewelry, make-up or nail polish.  Do not wear lotions, powders, or perfumes.  You may wear deodorant.  Do not shave 48 hours prior to surgery.  Men may shave face and neck.  Do not bring valuables to the hospital.  [facility] is not responsible for any belongings or valuables.  Contacts, dentures or bridgework may not be worn into surgery.  Leave your suitcase in the car.  After surgery it may be brought to your room.  For patients admitted to the hospital, discharge time will be determined by your treatment team.  Patients discharged the day of surgery will not be allowed to drive home.   Name and phone number of your driver:    Special instructions:    Please read over the following fact sheets that you were given. Pain Booklet, Coughing and Deep Breathing, Blood Transfusion Information, MRSA Information and Surgical Site Infection Prevention

## 2015-08-15 NOTE — Progress Notes (Signed)
Anesthesia Chart Review:  Pt is 69 year old male scheduled for ventral hernia repair on 08/26/2015 with Dr. [name].   PCP is Dr. Ria Bush. Cardiologist is Dr. [name]. Pulmonologist Dr. Lenna Gilford.  History includes non-smoker, ascending aortic aneurysm (s/p replacement 06/2014), critical AS (s/p AVR 06/2014), atrial fibrillation (post-op after AVR, none since), HLD, HTN, OSA without CPAP compliance, asthma. BMI 29.   Medications include: albuterol, ASA, lipitor, metoprolol.   Preoperative labs reviewed.    CXR will be obtained DOS.   EKG 08/14/15: NSR. Possible Anterior infarct, age undetermined. Appears stable when compared to 06/28/14 EKG.   Echo 08/02/14:  - Left ventricle: The cavity size was normal. Wall thickness was normal. Systolic function was normal. The estimated ejection fraction was in the range of 60% to 65%. Features are consistent with a pseudonormal left ventricular filling pattern, with concomitant abnormal relaxation and increased filling pressure (grade 2 diastolic dysfunction). - Aortic valve: AV valve prosthesis opens well Peak and mean gradients through the valve are 17 and 10 mm Hg respectively. - Mitral valve: There was mild regurgitation. - Left atrium: The atrium was moderately dilated.  Cardiac cath 05/22/14: Final Conclusions:  1. No significant obstructive CAD (20-30% mid LAD, 50% ostial RCA-small non-dominant vessel, normal LM and CX) 2. Left dominant circulation  3. Normal right heart pressures.  4. Moderate to severe aortic stenosis.  5. Ascending thoracic aneurysm.  Recommendations: Recommend AVR with aortic root grafting  Carotid duplex on 06/28/14 showed: Findings suggest 1-39% internal carotid artery stenosis bilaterally. Vertebral arteries are patent with antegrade flow.   PFT's FVC 3.72 79%, FEV1 2.54 73%, DLCO 31.88 94% done 04/04/14.  If no changes, I anticipate pt can proceed with surgery as scheduled.   Willeen Cass, FNP-BC [facility]  [facility]/Anesthesiology Phone: [phone] 08/15/2015 1:25 PM

## 2015-08-20 ENCOUNTER — Encounter: Payer: Self-pay | Admitting: Family Medicine

## 2015-08-25 MED ORDER — DEXTROSE 5 % IV SOLN
1.5000 g | INTRAVENOUS | Status: AC
  Administered 2015-08-26: 1.5 g via INTRAVENOUS
  Filled 2015-08-25: qty 1.5

## 2015-08-26 ENCOUNTER — Encounter (HOSPITAL_COMMUNITY): Payer: Self-pay | Admitting: *Deleted

## 2015-08-26 ENCOUNTER — Inpatient Hospital Stay (HOSPITAL_COMMUNITY): Payer: Commercial Managed Care - HMO

## 2015-08-26 ENCOUNTER — Inpatient Hospital Stay (HOSPITAL_COMMUNITY): Payer: Commercial Managed Care - HMO | Admitting: Certified Registered Nurse Anesthetist

## 2015-08-26 ENCOUNTER — Observation Stay (HOSPITAL_COMMUNITY)
Admission: RE | Admit: 2015-08-26 | Discharge: 2015-08-27 | Disposition: A | Discharge status: Home / Self Care | Payer: Commercial Managed Care - HMO | Source: Ambulatory Visit | Attending: Cardiothoracic Surgery | Admitting: Cardiothoracic Surgery

## 2015-08-26 ENCOUNTER — Encounter (HOSPITAL_COMMUNITY)
Admission: RE | Disposition: A | Discharge status: Home / Self Care | Payer: Self-pay | Source: Ambulatory Visit | Attending: Cardiothoracic Surgery

## 2015-08-26 ENCOUNTER — Inpatient Hospital Stay (HOSPITAL_COMMUNITY): Payer: Commercial Managed Care - HMO | Admitting: Emergency Medicine

## 2015-08-26 DIAGNOSIS — Z9889 Other specified postprocedural states: Secondary | ICD-10-CM | POA: Diagnosis not present

## 2015-08-26 DIAGNOSIS — H409 Unspecified glaucoma: Secondary | ICD-10-CM | POA: Insufficient documentation

## 2015-08-26 DIAGNOSIS — E7800 Pure hypercholesterolemia, unspecified: Secondary | ICD-10-CM | POA: Diagnosis not present

## 2015-08-26 DIAGNOSIS — I712 Thoracic aortic aneurysm, without rupture: Secondary | ICD-10-CM | POA: Insufficient documentation

## 2015-08-26 DIAGNOSIS — Z888 Allergy status to other drugs, medicaments and biological substances status: Secondary | ICD-10-CM | POA: Diagnosis not present

## 2015-08-26 DIAGNOSIS — J45909 Unspecified asthma, uncomplicated: Secondary | ICD-10-CM | POA: Diagnosis not present

## 2015-08-26 DIAGNOSIS — Z419 Encounter for procedure for purposes other than remedying health state, unspecified: Secondary | ICD-10-CM

## 2015-08-26 DIAGNOSIS — Z952 Presence of prosthetic heart valve: Secondary | ICD-10-CM | POA: Diagnosis not present

## 2015-08-26 DIAGNOSIS — M1990 Unspecified osteoarthritis, unspecified site: Secondary | ICD-10-CM | POA: Diagnosis not present

## 2015-08-26 DIAGNOSIS — E291 Testicular hypofunction: Secondary | ICD-10-CM | POA: Diagnosis not present

## 2015-08-26 DIAGNOSIS — I10 Essential (primary) hypertension: Secondary | ICD-10-CM | POA: Insufficient documentation

## 2015-08-26 DIAGNOSIS — I4891 Unspecified atrial fibrillation: Secondary | ICD-10-CM | POA: Diagnosis not present

## 2015-08-26 DIAGNOSIS — K436 Other and unspecified ventral hernia with obstruction, without gangrene: Secondary | ICD-10-CM

## 2015-08-26 DIAGNOSIS — Z87442 Personal history of urinary calculi: Secondary | ICD-10-CM | POA: Diagnosis not present

## 2015-08-26 DIAGNOSIS — G4733 Obstructive sleep apnea (adult) (pediatric): Secondary | ICD-10-CM | POA: Insufficient documentation

## 2015-08-26 DIAGNOSIS — R911 Solitary pulmonary nodule: Secondary | ICD-10-CM | POA: Diagnosis not present

## 2015-08-26 DIAGNOSIS — Z86010 Personal history of colonic polyps: Secondary | ICD-10-CM | POA: Diagnosis not present

## 2015-08-26 DIAGNOSIS — E785 Hyperlipidemia, unspecified: Secondary | ICD-10-CM | POA: Insufficient documentation

## 2015-08-26 DIAGNOSIS — I350 Nonrheumatic aortic (valve) stenosis: Secondary | ICD-10-CM | POA: Diagnosis not present

## 2015-08-26 DIAGNOSIS — D869 Sarcoidosis, unspecified: Secondary | ICD-10-CM | POA: Insufficient documentation

## 2015-08-26 DIAGNOSIS — I739 Peripheral vascular disease, unspecified: Secondary | ICD-10-CM | POA: Diagnosis not present

## 2015-08-26 DIAGNOSIS — K439 Ventral hernia without obstruction or gangrene: Principal | ICD-10-CM | POA: Insufficient documentation

## 2015-08-26 HISTORY — PX: VENTRAL HERNIA REPAIR: SHX424

## 2015-08-26 LAB — GLUCOSE, CAPILLARY
Glucose-Capillary: 104 mg/dL — ABNORMAL HIGH (ref 65–99)
Glucose-Capillary: 130 mg/dL — ABNORMAL HIGH (ref 65–99)
Glucose-Capillary: 129 mg/dL — ABNORMAL HIGH (ref 65–99)

## 2015-08-26 SURGERY — REPAIR, HERNIA, VENTRAL
Anesthesia: General | Site: Abdomen

## 2015-08-26 MED ORDER — HYDROMORPHONE HCL 1 MG/ML IJ SOLN: 0.2500 mg | INTRAMUSCULAR | Status: DC | PRN

## 2015-08-26 MED ORDER — METOPROLOL TARTRATE 25 MG PO TABS
25.0000 mg | ORAL_TABLET | Freq: Two times a day (BID) | ORAL | Status: DC
  Administered 2015-08-26 – 2015-08-27 (×2): 25 mg via ORAL
  Filled 2015-08-26 (×2): qty 1

## 2015-08-26 MED ORDER — ROCURONIUM BROMIDE 50 MG/5ML IV SOLN
INTRAVENOUS | Status: AC
  Filled 2015-08-26: qty 1

## 2015-08-26 MED ORDER — EPHEDRINE SULFATE 50 MG/ML IJ SOLN
INTRAMUSCULAR | Status: AC
  Filled 2015-08-26: qty 1

## 2015-08-26 MED ORDER — ACETAMINOPHEN 160 MG/5ML PO SOLN: 1000.0000 mg | Freq: Four times a day (QID) | ORAL | Status: DC

## 2015-08-26 MED ORDER — PHENYLEPHRINE 40 MCG/ML (10ML) SYRINGE FOR IV PUSH (FOR BLOOD PRESSURE SUPPORT)
PREFILLED_SYRINGE | INTRAVENOUS | Status: AC
  Filled 2015-08-26: qty 20

## 2015-08-26 MED ORDER — 0.9 % SODIUM CHLORIDE (POUR BTL) OPTIME
TOPICAL | Status: DC | PRN
  Administered 2015-08-26 (×2): 1000 mL

## 2015-08-26 MED ORDER — DEXAMETHASONE SODIUM PHOSPHATE 4 MG/ML IJ SOLN
INTRAMUSCULAR | Status: DC | PRN
  Administered 2015-08-26: 4 mg via INTRAVENOUS

## 2015-08-26 MED ORDER — PROMETHAZINE HCL 25 MG/ML IJ SOLN: 6.2500 mg | INTRAMUSCULAR | Status: DC | PRN

## 2015-08-26 MED ORDER — FENTANYL CITRATE (PF) 100 MCG/2ML IJ SOLN
25.0000 ug | INTRAMUSCULAR | Status: DC | PRN
Start: 2015-08-26 — End: 2015-08-27

## 2015-08-26 MED ORDER — NEOSTIGMINE METHYLSULFATE 10 MG/10ML IV SOLN
INTRAVENOUS | Status: DC | PRN
Start: 2015-08-26 — End: 2015-08-26
  Administered 2015-08-26: 5 mg via INTRAVENOUS

## 2015-08-26 MED ORDER — ONDANSETRON HCL 4 MG/2ML IJ SOLN
INTRAMUSCULAR | Status: DC | PRN
  Administered 2015-08-26: 4 mg via INTRAVENOUS

## 2015-08-26 MED ORDER — PROPOFOL 10 MG/ML IV BOLUS
INTRAVENOUS | Status: DC | PRN
Start: 2015-08-26 — End: 2015-08-26
  Administered 2015-08-26: 120 mg via INTRAVENOUS

## 2015-08-26 MED ORDER — SODIUM CHLORIDE 0.9 % IJ SOLN
INTRAMUSCULAR | Status: AC
  Filled 2015-08-26: qty 10

## 2015-08-26 MED ORDER — ACETAMINOPHEN 500 MG PO TABS
1000.0000 mg | ORAL_TABLET | Freq: Four times a day (QID) | ORAL | Status: DC
  Administered 2015-08-26 – 2015-08-27 (×4): 1000 mg via ORAL
  Filled 2015-08-26 (×4): qty 2

## 2015-08-26 MED ORDER — ONDANSETRON HCL 4 MG/2ML IJ SOLN
4.0000 mg | Freq: Four times a day (QID) | INTRAMUSCULAR | Status: DC | PRN
Start: 2015-08-26 — End: 2015-08-27

## 2015-08-26 MED ORDER — INSULIN ASPART 100 UNIT/ML ~~LOC~~ SOLN
0.0000 [IU] | Freq: Four times a day (QID) | SUBCUTANEOUS | Status: DC
  Administered 2015-08-26 (×2): 2 [IU] via SUBCUTANEOUS

## 2015-08-26 MED ORDER — ASPIRIN EC 81 MG PO TBEC
81.0000 mg | DELAYED_RELEASE_TABLET | Freq: Every day | ORAL | Status: DC
  Administered 2015-08-27: 81 mg via ORAL
  Filled 2015-08-26: qty 1

## 2015-08-26 MED ORDER — LACTATED RINGERS IV SOLN
INTRAVENOUS | Status: DC | PRN
  Administered 2015-08-26 (×2): via INTRAVENOUS

## 2015-08-26 MED ORDER — SUCCINYLCHOLINE CHLORIDE 20 MG/ML IJ SOLN
INTRAMUSCULAR | Status: AC
  Filled 2015-08-26: qty 1

## 2015-08-26 MED ORDER — FENTANYL CITRATE (PF) 100 MCG/2ML IJ SOLN
INTRAMUSCULAR | Status: DC | PRN
Start: 2015-08-26 — End: 2015-08-26
  Administered 2015-08-26: 100 ug via INTRAVENOUS
  Administered 2015-08-26: 50 ug via INTRAVENOUS
  Administered 2015-08-26: 100 ug via INTRAVENOUS

## 2015-08-26 MED ORDER — LIDOCAINE HCL (CARDIAC) 20 MG/ML IV SOLN
INTRAVENOUS | Status: AC
  Filled 2015-08-26: qty 10

## 2015-08-26 MED ORDER — ONDANSETRON HCL 4 MG/2ML IJ SOLN
INTRAMUSCULAR | Status: AC
  Filled 2015-08-26: qty 2

## 2015-08-26 MED ORDER — ARTIFICIAL TEARS OP OINT
TOPICAL_OINTMENT | OPHTHALMIC | Status: AC
  Filled 2015-08-26: qty 7

## 2015-08-26 MED ORDER — BRINZOLAMIDE 1 % OP SUSP
1.0000 [drp] | Freq: Two times a day (BID) | OPHTHALMIC | Status: DC
  Administered 2015-08-26 – 2015-08-27 (×2): 1 [drp] via OPHTHALMIC
  Filled 2015-08-26: qty 10

## 2015-08-26 MED ORDER — TRAMADOL HCL 50 MG PO TABS
50.0000 mg | ORAL_TABLET | Freq: Four times a day (QID) | ORAL | Status: DC | PRN
  Filled 2015-08-26: qty 2

## 2015-08-26 MED ORDER — PHENYLEPHRINE 40 MCG/ML (10ML) SYRINGE FOR IV PUSH (FOR BLOOD PRESSURE SUPPORT)
PREFILLED_SYRINGE | INTRAVENOUS | Status: AC
  Filled 2015-08-26: qty 10

## 2015-08-26 MED ORDER — OXYCODONE HCL 5 MG PO TABS
5.0000 mg | ORAL_TABLET | ORAL | Status: DC | PRN
  Administered 2015-08-26: 10 mg via ORAL
  Filled 2015-08-26: qty 2

## 2015-08-26 MED ORDER — DEXAMETHASONE SODIUM PHOSPHATE 4 MG/ML IJ SOLN
INTRAMUSCULAR | Status: AC
  Filled 2015-08-26: qty 1

## 2015-08-26 MED ORDER — DEXTROSE 5 % IV SOLN
1.5000 g | Freq: Two times a day (BID) | INTRAVENOUS | Status: AC
  Administered 2015-08-26 – 2015-08-27 (×2): 1.5 g via INTRAVENOUS
  Filled 2015-08-26 (×2): qty 1.5

## 2015-08-26 MED ORDER — ATORVASTATIN CALCIUM 10 MG PO TABS
10.0000 mg | ORAL_TABLET | Freq: Every day | ORAL | Status: DC
  Administered 2015-08-26: 10 mg via ORAL
  Filled 2015-08-26: qty 1

## 2015-08-26 MED ORDER — PROPOFOL 10 MG/ML IV BOLUS
INTRAVENOUS | Status: AC
  Filled 2015-08-26: qty 40

## 2015-08-26 MED ORDER — MIDAZOLAM HCL 5 MG/5ML IJ SOLN
INTRAMUSCULAR | Status: DC | PRN
  Administered 2015-08-26: 2 mg via INTRAVENOUS

## 2015-08-26 MED ORDER — ROCURONIUM BROMIDE 100 MG/10ML IV SOLN
INTRAVENOUS | Status: DC | PRN
  Administered 2015-08-26: 50 mg via INTRAVENOUS
  Administered 2015-08-26: 20 mg via INTRAVENOUS

## 2015-08-26 MED ORDER — ESMOLOL HCL 100 MG/10ML IV SOLN
INTRAVENOUS | Status: AC
  Filled 2015-08-26: qty 10

## 2015-08-26 MED ORDER — GLYCOPYRROLATE 0.2 MG/ML IJ SOLN
INTRAMUSCULAR | Status: DC | PRN
  Administered 2015-08-26: 0.6 mg via INTRAVENOUS

## 2015-08-26 MED ORDER — FENTANYL CITRATE (PF) 250 MCG/5ML IJ SOLN
INTRAMUSCULAR | Status: AC
  Filled 2015-08-26: qty 5

## 2015-08-26 MED ORDER — ROCURONIUM BROMIDE 50 MG/5ML IV SOLN
INTRAVENOUS | Status: AC
  Filled 2015-08-26: qty 2

## 2015-08-26 MED ORDER — ARTIFICIAL TEARS OP OINT
TOPICAL_OINTMENT | OPHTHALMIC | Status: DC | PRN
  Administered 2015-08-26: 1 via OPHTHALMIC

## 2015-08-26 MED ORDER — MIDAZOLAM HCL 2 MG/2ML IJ SOLN
INTRAMUSCULAR | Status: AC
  Filled 2015-08-26: qty 2

## 2015-08-26 MED ORDER — DEXTROSE-NACL 5-0.9 % IV SOLN
INTRAVENOUS | Status: DC
  Administered 2015-08-26: 16:00:00 via INTRAVENOUS

## 2015-08-26 MED ORDER — SODIUM CHLORIDE 0.9 % IV SOLN
10.0000 mg | INTRAVENOUS | Status: DC | PRN
  Administered 2015-08-26: 10 ug/min via INTRAVENOUS

## 2015-08-26 MED ORDER — LIDOCAINE HCL (CARDIAC) 20 MG/ML IV SOLN
INTRAVENOUS | Status: DC | PRN
  Administered 2015-08-26: 80 mg via INTRAVENOUS

## 2015-08-26 SURGICAL SUPPLY — 37 items
CANISTER SUCTION 2500CC (MISCELLANEOUS) ×2 IMPLANT
COVER SURGICAL LIGHT HANDLE (MISCELLANEOUS) ×2 IMPLANT
DRAPE INCISE IOBAN 66X45 STRL (DRAPES) IMPLANT
DRAPE LAPAROTOMY T 102X78X121 (DRAPES) ×2 IMPLANT
DRAPE SLUSH/WARMER DISC (DRAPES) IMPLANT
DRAPE WARM FLUID 44X44 (DRAPE) ×2 IMPLANT
ELECT CAUTERY BLADE 6.4 (BLADE) ×2 IMPLANT
ELECT REM PT RETURN 9FT ADLT (ELECTROSURGICAL) ×2
ELECTRODE REM PT RTRN 9FT ADLT (ELECTROSURGICAL) ×1 IMPLANT
GAUZE SPONGE 4X4 12PLY STRL (GAUZE/BANDAGES/DRESSINGS) ×2 IMPLANT
GLOVE BIO SURGEON STRL SZ 6.5 (GLOVE) ×4 IMPLANT
GLOVE BIOGEL PI IND STRL 6.5 (GLOVE) ×2 IMPLANT
GLOVE BIOGEL PI INDICATOR 6.5 (GLOVE) ×2
GLOVE SURG SS PI 7.0 STRL IVOR (GLOVE) ×2 IMPLANT
GOWN STRL REUS W/ TWL LRG LVL3 (GOWN DISPOSABLE) ×2 IMPLANT
GOWN STRL REUS W/ TWL XL LVL3 (GOWN DISPOSABLE) ×1 IMPLANT
GOWN STRL REUS W/TWL LRG LVL3 (GOWN DISPOSABLE) ×2
GOWN STRL REUS W/TWL XL LVL3 (GOWN DISPOSABLE) ×1
KIT BASIN OR (CUSTOM PROCEDURE TRAY) ×2 IMPLANT
KIT ROOM TURNOVER OR (KITS) ×2 IMPLANT
LIQUID BAND (GAUZE/BANDAGES/DRESSINGS) ×2 IMPLANT
MARKER SKIN DUAL TIP RULER LAB (MISCELLANEOUS) ×2 IMPLANT
MESH VENTRALEX ST 2.5 CRC MED (Mesh General) ×2 IMPLANT
NS IRRIG 1000ML POUR BTL (IV SOLUTION) ×4 IMPLANT
PACK GENERAL/GYN (CUSTOM PROCEDURE TRAY) ×2 IMPLANT
PAD ARMBOARD 7.5X6 YLW CONV (MISCELLANEOUS) ×4 IMPLANT
SPONGE LAP 18X18 X RAY DECT (DISPOSABLE) ×2 IMPLANT
STAPLER VISISTAT 35W (STAPLE) ×2 IMPLANT
SUT PROLENE 0 CT 1 CR/8 (SUTURE) ×4 IMPLANT
SUT SILK 2 0 SH CR/8 (SUTURE) ×2 IMPLANT
SUT SILK 2 0 TIES 10X30 (SUTURE) IMPLANT
SUT SILK 3 0 TIES 10X30 (SUTURE) IMPLANT
SUT VIC AB 2-0 CT1 27 (SUTURE) ×1
SUT VIC AB 2-0 CT1 TAPERPNT 27 (SUTURE) ×1 IMPLANT
SUT VIC AB 3-0 X1 27 (SUTURE) ×2 IMPLANT
TOWEL OR 17X24 6PK STRL BLUE (TOWEL DISPOSABLE) ×2 IMPLANT
TOWEL OR 17X26 10 PK STRL BLUE (TOWEL DISPOSABLE) ×2 IMPLANT

## 2015-08-26 NOTE — Discharge Summary (Signed)
Physician Discharge Summary       [address]       [place] [address]             [phone]    Patient ID: Jeremy Meadows MRN: [id number] DOB/AGE: 1946-04-10 69 y.o.  Admit date: 08/26/2015 Discharge date: 08/26/2015  Principle Diagnosis: Ventral hernia  Active Diagnoses:  1.HLD (hyperlipidemia) 2. History of aortic root enlargement and aortic insufficiency (s/p supracoronary replacement of ascending aorta with Hemashield graft 32 mm with cardiopulmonary bypass and circulatory arrest. Right axillary arterial cannulation, and AVR with pericardial tissue valve 07/02/2014) 3. History of left pulmonary nodule (granuloma?) 4. History of hypertension 5. History of a fib 6.History of OSA (does not use CPAP) 7. History of nephrolithiasis 8. History of benign neoplasm of colon  Patient Active Problem List   Diagnosis Date Noted  . Surgery, elective 08/26/2015  . Cough 08/12/2015  . Health maintenance examination 08/12/2015  . Advanced care planning/counseling discussion 08/05/2014  . Medicare annual wellness visit, subsequent 08/05/2014  . Prediabetes 08/05/2014  . Atrial fibrillation ([place]) 07/23/2014  . S/P AVR (aortic valve replacement) and aortoplasty 07/23/2014  . Thoracic ascending aortic aneurysm ([place]) 07/02/2014  . Sarcoidosis ([place]) 04/26/2014  . Asthmatic bronchitis 04/24/2014  . Pulmonary nodule, left   . Gross hematuria 11/06/2013  . HTN (hypertension) 08/29/2013  . Aortic insufficiency   . Aortic valve stenosis, severe   . DJD (degenerative joint disease) 02/27/2013  . Knee pain 02/27/2013  . Wedge compression fracture of thoracic vertebra ([place]) 02/27/2013  . TESTICULAR HYPOFUNCTION 09/08/2010  . NEPHROLITHIASIS 08/23/2009  . 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 Glaucoma 08/22/2008  . COLONIC POLYPS 12/18/2007  . HYPERCHOLESTEROLEMIA 12/18/2007  . Obstructive sleep apnea 12/18/2007  . ALLERGIC RHINITIS 12/18/2007     Procedure (s):  Open  repair of ventral hernia with mesh by Dr. [name] on 08/26/2015.  History of Presenting Illness: This is a 69 year old male who developed a ventral hernia postop at lower sternal incision. Patient has notedslow enlargement and discomfort in the area..He has done well from his avr/acending aortic repair last year. Dr. [name] discussed the need for ventral hernia repair. Potential risks,benefits,and complications were discussed with the patient and he agreed to proceed with surgery. [facility] in order to undergo open repair of ventral hernia with mesh.  Brief Hospital Course:  He remained afebrile and hemodynamically stable. He is ambulating on room air. He tolerating a diet. His incision is clean and dry.He is felt surgically stable for discharge today.   Latest Vital Signs: Blood pressure 103/66, pulse 72, temperature 95.6 F (35.3 C), temperature source Oral, resp. rate 16, height 5\' 11"  (1.803 m), weight 216 lb (97.977 kg), SpO2 93 %.  Physical Exam:  General appearance: alert, cooperative and no distress Heart: regular rate and rhythm Lungs: clear to auscultation bilaterally Abdomen: benign, + BS Extremities: trace edema Wound: incis healingwell  Discharge Condition:Stable and discharged to home  Recent laboratory studies:  Lab Results  Component Value Date   WBC 7.1 08/14/2015   HGB 15.8 08/14/2015   HCT 46.4 08/14/2015   MCV 94.3 08/14/2015   PLT 143* 08/14/2015   Lab Results  Component Value Date   NA 141 08/14/2015   K 4.3 08/14/2015   CL 111 08/14/2015   CO2 20* 08/14/2015   CREATININE 1.13 08/14/2015   GLUCOSE 93 08/14/2015    Diagnostic Studies: Dg Chest 2 View  08/26/2015  CLINICAL DATA:  Hernia  repair EXAM: CHEST  2 VIEW COMPARISON:  CT 02/20/2015 FINDINGS: There is prior sternotomy and aortic valvuloplasty. There is a shallow inspiration. Mild linear scarring is present in both bases without significant change from 08/15/2014.  No confluent airspace opacity. No effusions. Normal pulmonary vasculature. IMPRESSION: No active cardiopulmonary disease. Electronically Signed   By: Andreas Newport M.D.   On: 08/26/2015 06:18   Discharge Medications:   Medication List    TAKE these medications        albuterol 108 (90 BASE) MCG/ACT inhaler  Commonly known as:  VENTOLIN HFA  Inhale 2 puffs into the lungs every 6 (six) hours as needed for wheezing or shortness of breath.     aspirin 81 MG tablet  Take 81 mg by mouth daily.     atorvastatin 10 MG tablet  Commonly known as:  LIPITOR  Take 1 tablet (10 mg total) by mouth daily at 6 PM.     brinzolamide 1 % ophthalmic suspension  Commonly known as:  AZOPT  Place 1 drop into both eyes 2 (two) times daily.     cholecalciferol 1000 UNITS tablet  Commonly known as:  VITAMIN D  Take 1,000 Units by mouth daily.     HYDROcodone-homatropine 5-1.5 MG/5ML syrup  Commonly known as:  HYCODAN  Take 5 mLs by mouth at bedtime as needed.     metoprolol tartrate 25 MG tablet  Commonly known as:  LOPRESSOR  Take 1 tablet (25 mg total) by mouth 2 (two) times daily.     traMADol 50 MG tablet  Commonly known as:  ULTRAM  Take 1-2 tablets (50-100 mg total) by mouth every 6 (six) hours as needed (mild pain).     vitamin C 500 MG tablet  Commonly known as:  ASCORBIC ACID  Take 500 mg by mouth daily.        Follow Up Appointments:     Follow-up Information    Follow up with Grace Isaac, MD On 09/15/2015.   Specialty:  Cardiothoracic Surgery   Why:  Appointmentis with physician assistant and appointment time is at 1:00 pm   Contact information:   [address] [place] [address] [phone]       Signed: Lars Pinks MPA-C 08/26/2015, 2:19 PM

## 2015-08-26 NOTE — Anesthesia Procedure Notes (Signed)
Procedure Name: Intubation Date/Time: 08/26/2015 7:48 AM Performed by: Jacquiline Doe A Pre-anesthesia Checklist: Patient identified, Emergency Drugs available, Suction available, Patient being monitored and Timeout performed Patient Re-evaluated:Patient Re-evaluated prior to inductionOxygen Delivery Method: Circle system utilized Preoxygenation: Pre-oxygenation with 100% oxygen Intubation Type: IV induction and Cricoid Pressure applied Ventilation: Mask ventilation without difficulty and Oral airway inserted - appropriate to patient size Laryngoscope Size: Mac and 4 Grade View: Grade I Tube type: Oral Tube size: 7.5 mm Number of attempts: 1 Airway Equipment and Method: Stylet Placement Confirmation: ETT inserted through vocal cords under direct vision,  positive ETCO2 and breath sounds checked- equal and bilateral Secured at: 23 cm Tube secured with: Tape Dental Injury: Teeth and Oropharynx as per pre-operative assessment

## 2015-08-26 NOTE — Discharge Instructions (Signed)
PATIENT INSTRUCTIONS HERNIA  FOLLOW-UP:   Call your physician immediately if you have any fevers greater than 102.5, drainage from you wound that is not clear or looks infected, persistent bleeding, increasing abdominal pain, problems urinating, or persistent nausea/vomiting.    WOUND CARE INSTRUCTIONS:  Keep a dry clean dressing on the wound if there is drainage. The initial bandage may be removed after 24 hours.  Once the wound has quit draining you may leave it open to air.  If clothing rubs against the wound or causes irritation and the wound is not draining you may cover it with a dry dressing during the daytime.  Try to keep the wound dry and avoid ointments on the wound unless directed to do so.  If the wound becomes bright red and painful or starts to drain infected material that is not clear, please contact your physician immediately.  If the wound is mildly pink and has a thick firm ridge underneath it, this is normal, and is referred to as a healing ridge.  This will resolve over the next 4-6 weeks.  DIET:  You may eat any foods that you can tolerate.  It is a good idea to eat a high fiber diet and take in plenty of fluids to prevent constipation.  If you do become constipated you may want to take a mild laxative or take ducolax tablets on a daily basis until your bowel habits are regular.  Constipation can be very uncomfortable, along with straining, after recent abdominal surgery.  ACTIVITY:  You are encouraged to cough and deep breath or use your incentive spirometer if you were given one, every 15-30 minutes when awake.  This will help prevent respiratory complications and low grade fevers post-operatively.  You may want to hug a pillow when coughing and sneezing to add additional support to the surgical area which will decrease pain during these times.  You are encouraged to walk and engage in light activity for the next two weeks.  You should not lift more than 10 pounds during this time  frame as it could put you at increased risk for a hernia recurrence.      MEDICATIONS:  Try to take narcotic medications and anti-inflammatory medications, such as tylenol, ibuprofen, naprosyn, etc., with food.  This will minimize stomach upset from the medication.  Should you develop nausea and vomiting from the pain medication, or develop a rash, please discontinue the medication and contact your physician.  You should not drive, make important decisions, or operate machinery when taking narcotic pain medication.  QUESTIONS:  Please feel free to call your physician or the hospital operator if you have any questions, and they will be glad to assist you.

## 2015-08-26 NOTE — Anesthesia Preprocedure Evaluation (Addendum)
Anesthesia Evaluation  Patient identified by MRN, date of birth, ID band Patient awake    History of Anesthesia Complications (+) PONV  Airway Mallampati: II  TM Distance: >3 FB Neck ROM: Full    Dental   Pulmonary asthma , sleep apnea ,    breath sounds clear to auscultation       Cardiovascular hypertension, + Peripheral Vascular Disease   Rhythm:Regular Rate:Normal     Neuro/Psych    GI/Hepatic   Endo/Other    Renal/GU Renal disease     Musculoskeletal   Abdominal   Peds  Hematology   Anesthesia Other Findings   Reproductive/Obstetrics                            Anesthesia Physical Anesthesia Plan  ASA: III  Anesthesia Plan: General   Post-op Pain Management:    Induction: Intravenous  Airway Management Planned: Oral ETT  Additional Equipment:   Intra-op Plan:   Post-operative Plan:   Informed Consent:   Dental advisory given  Plan Discussed with: Anesthesiologist and CRNA  Anesthesia Plan Comments:         Anesthesia Quick Evaluation

## 2015-08-26 NOTE — Transfer of Care (Signed)
Immediate Anesthesia Transfer of Care Note  Patient: Jeremy Meadows  Procedure(s) Performed: Procedure(s): VENTRAL HERNIA REPAIR WITH MESH ADULT (N/A)  Patient Location: PACU  Anesthesia Type:General  Level of Consciousness: awake, oriented, sedated, patient cooperative and responds to stimulation  Airway & Oxygen Therapy: Patient Spontanous Breathing and Patient connected to face mask oxygen  Post-op Assessment: Report given to RN, Post -op Vital signs reviewed and stable, Patient moving all extremities and Patient moving all extremities X 4  Post vital signs: Reviewed and stable  Last Vitals:  Filed Vitals:   08/26/15 0550 08/26/15 0934  BP: 147/92 115/68  Pulse: 70 92  Temp: 36.4 C 36.4 C  Resp: 20     Complications: No apparent anesthesia complications

## 2015-08-26 NOTE — Brief Op Note (Addendum)
      [address]       [place], [address]             [phone]      08/26/2015  9:14 AM  PATIENT:  Jeremy Meadows  69 y.o. male  PRE-OPERATIVE DIAGNOSIS:  VENTRAL HERNIA s/p sternotomy  POST-OPERATIVE DIAGNOSIS:  VENTRAL HERNIA  PROCEDURE:  VENTRAL HERNIA REPAIR WITH MESH   SURGEON:  Surgeon(s) and Role:    * Grace Isaac, MD - Primary  PHYSICIAN ASSISTANT: Lars Pinks PA-C  ANESTHESIA:   general  EBL:  Total I/O In: 1000 [I.V.:1000] Out: -   BLOOD ADMINISTERED:none  COUNTS CORRECT:  YES  DICTATION: .Dragon Dictation  PLAN OF CARE: Admit to inpatient   PATIENT DISPOSITION:  PACU - hemodynamically stable.   Delay start of Pharmacological VTE agent (>24hrs) due to surgical blood loss or risk of bleeding: yes

## 2015-08-26 NOTE — Anesthesia Postprocedure Evaluation (Signed)
Anesthesia Post Note  Patient: Jeremy Meadows  Procedure(s) Performed: Procedure(s) (LRB): VENTRAL HERNIA REPAIR WITH MESH ADULT (N/A)  Patient location during evaluation: PACU Anesthesia Type: General Level of consciousness: awake Pain management: pain level controlled Respiratory status: spontaneous breathing Cardiovascular status: blood pressure returned to baseline Anesthetic complications: no    Last Vitals:  Filed Vitals:   08/26/15 1446 08/26/15 1819  BP: 112/69 113/63  Pulse: 73 79  Temp: 36.4 C 36.2 C  Resp: 16 16    Last Pain:  Filed Vitals:   08/26/15 1821  PainSc: 2                  EDWARDS,Joshua Soulier

## 2015-08-26 NOTE — H&P (Signed)
[address]       [place] [address]             [phone]                    Ranferi M Blumer [place] Medical Record [id number] Date of Birth: Aug 06, 1946  Referring: No ref. Keegan Bensch found Primary Care: Ria Bush, MD  Chief Complaint:   No chief complaint on file.   History of Present Illness:    Jeremy Meadows 69 y.o. male is seen for repair of ventral postop at lower sternal incision. Patient has noted  Slow enlargement and discomfort in the area and comes in for repair.He has done well from his avr/acending aortic repair last year.   07/02/2014 OPERATIVE REPORT PREOPERATIVE DIAGNOSES: 1. Critical aortic stenosis. 2. Ascending aortic aneurysm. 3. 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  POSTOPERATIVE DIAGNOSES: 1. Critical aortic stenosis. 2. Ascending aortic aneurysm. 3. 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  PROCEDURE: Aortic valve replacement with [facility] pericardial tissue valve 23 mm, model 3300TFX, serial [id number]. Supracoronary replacement of ascending aorta with Hemashield graft 32 mm with cardiopulmonary bypass and circulatory arrest. Right axillary arterial cannulation.    Current Activity/ Functional Status:  Patient is independent with mobility/ambulation, transfers, ADL's, IADL's.   Zubrod Score: At the time of surgery this patient's most appropriate activity status/level should be described as: [x]     0    Normal activity, no symptoms []     1    Restricted in physical strenuous activity but ambulatory, able to do out light work []     2    Ambulatory and capable of self care, unable to do work activities, up and about               >50 % of waking hours                              []     3    Only limited self care, in bed greater than 50% of waking hours []     4    Completely disabled, no self care, confined to bed or chair []     5    Moribund   Past  Medical History  Diagnosis Date  . Unspecified glaucoma   . Allergic rhinitis, cause unspecified   . HLD (hyperlipidemia)   . Benign neoplasm of colon   . History of nephrolithiasis   . Other testicular hypofunction   . Aortic insufficiency     s/p AVR 2015  . Aortic root enlargement (HCC)     thoracic aorta  . Aortic valve stenosis 2015    s/p AVR 2015 [name]) - completed cardiac rehab 08/2014  . Pulmonary nodule, left 2015    2cm, ?sarcoid by biopsy  . Hypertension   . Obstructive sleep apnea (adult) (pediatric)     pt. doesn't use his machine at home.  . Asthma   . History of kidney stones   . Arthritis   . Atrial fibrillation ([place]) 07/23/2014  . Need for prophylactic antibiotic     Past Surgical History  Procedure Laterality Date  . Rhinoplasty  1980s  . Knee surgery Right 05/2012    arthroscopic  . Medial partial knee replacement Right 03/2013  . Knee surgery Right 12/2013    patella  . Lithotripsy    .  Lung biopsy Left 2015    by IR - benign  . Cataract extraction Bilateral   . Joint replacement Right 14    partial replacement  . Aortic valve replacement N/A 07/02/2014    Procedure: AORTIC VALVE REPLACEMENT (AVR) with 23 Aortic Magna Ease;  Surgeon: Grace Isaac, MD  . Ascending aortic root replacement N/A 07/02/2014    Procedure: supra coronary  ASCENDING AORTIC REPLACEMENT to the Inominate Artery with 32mm Hemashield Platinum with  circulatory arrest;  Surgeon: Grace Isaac, MD  . Intraoperative transesophageal echocardiogram N/A 07/02/2014  . Colonoscopy  2013    WNL, rpt 5 yrs  . Left and right heart catheterization with coronary angiogram N/A 05/22/2014    no significant obstructive CAD ([name])  . Coronary artery bypass graft    . Eye surgery    . Cardiac catheterization      Family History  Problem Relation Age of Onset  . CAD Father 70    MI  . Heart disease Brother   . Heart disease Brother   . Heart disease Brother   . Cancer Neg Hx       Social History   Social History  . Marital Status: Married    Spouse Name: N/A  . Number of Children: 2  . Years of Education: N/A   Occupational History  . manager    Social History Main Topics  . Smoking status: Never Smoker   . Smokeless tobacco: Never Used  . Alcohol Use: No  . Drug Use: No  . Sexual Activity: Not on file   Other Topics Concern  . Not on file   Social History Narrative   Lives with [name]   Occupation: retired, was in Conservator, 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   Activity: softball, walking   Diet: good water, fruits/vegetables daily    History  Smoking status  . Never Smoker   Smokeless tobacco  . Never Used    History  Alcohol Use No     Allergies  Allergen Reactions  . Ace Inhibitors Cough    Hacking cough    Current Facility-Administered Medications  Medication Dose Route Frequency Mac Dowdell Last Rate Last Dose  . cefUROXime (ZINACEF) 1.5 g in dextrose 5 % 50 mL IVPB  1.5 g Intravenous To SS-Surg Grace Isaac, MD          Review of Systems:     Cardiac Review of Systems: Y or N  Chest Pain [    ]  Resting SOB [   ] Exertional SOB  [  ]  Orthopnea [  ]   Pedal Edema [   ]    Palpitations [  ] Syncope  [  ]   Presyncope [   ]  General Review of Systems: [Y] = yes [  ]=no Constitional: recent weight change [  ];  Wt loss over the last 3 months [   ] anorexia [  ]; fatigue [  ]; nausea [  ]; night sweats [  ]; fever [  ]; or chills [  ];          Dental: poor dentition[  ]; Last Dentist visit:   Eye : blurred vision [  ]; diplopia [   ]; vision changes [  ];  Amaurosis fugax[  ]; Resp: cough [  ];  wheezing[  ];  hemoptysis[  ]; shortness of breath[  ]; paroxysmal nocturnal dyspnea[  ]; dyspnea on exertion[  ]; or orthopnea[  ];  GI:  gallstones[  ], vomiting[  ];  dysphagia[  ]; melena[  ];  hematochezia [  ]; heartburn[  ];   Hx of  Colonoscopy[  ]; GU: kidney stones [  ]; hematuria[  ];   dysuria [  ];  nocturia[  ];  history of     obstruction [   ]; urinary frequency [  ]             Skin: rash, swelling[  ];, hair loss[  ];  peripheral edema[  ];  or itching[  ]; Musculosketetal: myalgias[  ];  joint swelling[  ];  joint erythema[  ];  joint pain[  ];  back pain[  ];  Heme/Lymph: bruising[  ];  bleeding[  ];  anemia[  ];  Neuro: TIA[  ];  headaches[  ];  stroke[  ];  vertigo[  ];  seizures[  ];   paresthesias[  ];  difficulty walking[  ];  Psych:depression[  ]; anxiety[  ];  Endocrine: diabetes[  ];  thyroid dysfunction[  ];  Immunizations: Flu up to date [  ]; Pneumococcal up to date [  ];  Other:  Physical Exam: BP 147/92 mmHg  Pulse 70  Temp(Src) 97.6 F (36.4 C) (Oral)  Resp 20  Ht 5\' 11"  (1.803 m)  Wt 210 lb (95.255 kg)  BMI 29.30 kg/m2  SpO2 100%  PHYSICAL EXAMINATION: General appearance: alert, cooperative, appears stated age and no distress Head: Normocephalic, without obvious abnormality, atraumatic Neck: no adenopathy, no carotid bruit, no JVD, supple, symmetrical, trachea midline and thyroid not enlarged, symmetric, no tenderness/mass/nodules Lymph nodes: Cervical, supraclavicular, and axillary nodes normal. Resp: clear to auscultation bilaterally Back: symmetric, no curvature. ROM normal. No CVA tenderness. Cardio: regular rate and rhythm, S1, S2 normal, no murmur, click, rub or gallop GI: soft, non-tender; bowel sounds normal; no masses,  no organomegaly Extremities: extremities normal, atraumatic, no cyanosis or edema Neurologic: Grossly normal  3 cm bulge at lower pole of the sternal incision consistent with ventral hernia  Diagnostic Studies & Laboratory data:     Recent Radiology Findings:   Dg Chest 2 View  08/26/2015  CLINICAL DATA:  Hernia repair EXAM: CHEST  2 VIEW COMPARISON:  CT 02/20/2015 FINDINGS: There is prior sternotomy and aortic valvuloplasty. There is a shallow inspiration. Mild linear scarring is present in both bases without significant change from 08/15/2014. No confluent airspace  opacity. No effusions. Normal pulmonary vasculature. IMPRESSION: No active cardiopulmonary disease. Electronically Signed   By: Andreas Newport M.D.   On: 08/26/2015 06:18     I have independently reviewed the above radiologic studies.  Recent Lab Findings: Lab Results  Component Value Date   WBC 7.1 08/14/2015   HGB 15.8 08/14/2015   HCT 46.4 08/14/2015   PLT 143* 08/14/2015   GLUCOSE 93 08/14/2015   CHOL 118 08/05/2015   TRIG 150.0* 08/05/2015   HDL 27.80* 08/05/2015   LDLCALC 61 08/05/2015   ALT 50 08/14/2015   AST 47* 08/14/2015   NA 141 08/14/2015   K 4.3 08/14/2015   CL 111 08/14/2015   CREATININE 1.13 08/14/2015   BUN 17 08/14/2015   CO2 20* 08/14/2015   TSH 1.86 08/05/2014   INR 0.99 08/14/2015   HGBA1C 5.7 08/05/2015      Assessment / Plan:      Ventral hernia s/p sternotomy- for repair.  I have discussed with the patient risks and options and recommended proceeding with repair   The  goals risks and alternatives of the planned surgical procedure ventral hernia repair   have been discussed with the patient in detail. The risks of the procedure including death, infection, stroke, myocardial infarction, bleeding, blood transfusion have all been discussed specifically.  I have quoted Jeremy Meadows a 1 % of perioperative mortality and a complication rate as high as 10 %. The patient's questions have been answered.Jeremy Meadows is willing  to proceed with the planned procedure.  Grace Isaac MD      [address] [place] [address] Office [phone]   Beeper [phone]  08/26/2015 7:20 AM

## 2015-08-26 NOTE — Progress Notes (Signed)
EVENING ROUNDS NOTE :     [address]       [place] [address]             [phone]                 Day of Surgery Procedure(s) (LRB): VENTRAL HERNIA REPAIR WITH MESH ADULT (N/A)  Total Length of Stay:  LOS: 0 days  BP 112/69 mmHg  Pulse 73  Temp(Src) 97.6 F (36.4 C) (Axillary)  Resp 16  Ht 5\' 11"  (1.803 m)  Wt 216 lb (97.977 kg)  BMI 30.14 kg/m2  SpO2 94%  .Intake/Output      11/28 0701 - 11/29 0700 11/29 0701 - 11/30 0700   P.O.  240   I.V. (mL/kg)  2021 (20.6)   Total Intake(mL/kg)  2261 (23.1)   Urine (mL/kg/hr)  300 (0.3)   Blood  25 (0)   Total Output   325   Net   +1936          . dextrose 5 % and 0.9% NaCl 100 mL/hr at 08/26/15 1616     Lab Results  Component Value Date   WBC 7.1 08/14/2015   HGB 15.8 08/14/2015   HCT 46.4 08/14/2015   PLT 143* 08/14/2015   GLUCOSE 93 08/14/2015   CHOL 118 08/05/2015   TRIG 150.0* 08/05/2015   HDL 27.80* 08/05/2015   LDLCALC 61 08/05/2015   ALT 50 08/14/2015   AST 47* 08/14/2015   NA 141 08/14/2015   K 4.3 08/14/2015   CL 111 08/14/2015   CREATININE 1.13 08/14/2015   BUN 17 08/14/2015   CO2 20* 08/14/2015   TSH 1.86 08/05/2014   PSA 1.07 08/05/2015   INR 0.99 08/14/2015   HGBA1C 5.7 08/05/2015   Stable post op Voiding ok Likely d/c home in am  Grace Isaac MD  Beeper [phone] Office [phone] 08/26/2015 6:10 PM

## 2015-08-26 NOTE — Anesthesia Postprocedure Evaluation (Signed)
Anesthesia Post Note  Patient: Jeremy Meadows  Procedure(s) Performed: Procedure(s) (LRB): VENTRAL HERNIA REPAIR WITH MESH ADULT (N/A)  Patient location during evaluation: PACU Anesthesia Type: General Level of consciousness: awake Pain management: pain level controlled Vital Signs Assessment: post-procedure vital signs reviewed and stable Respiratory status: spontaneous breathing Cardiovascular status: blood pressure returned to baseline Anesthetic complications: no    Last Vitals:  Filed Vitals:   08/26/15 1000 08/26/15 1015  BP: 109/65 99/73  Pulse: 77 78  Temp:  36.5 C  Resp: 11 16    Last Pain:  Filed Vitals:   08/26/15 1018  PainSc: 4                  EDWARDS,Mattia Liford

## 2015-08-27 ENCOUNTER — Encounter (HOSPITAL_COMMUNITY): Payer: Self-pay | Admitting: Cardiothoracic Surgery

## 2015-08-27 DIAGNOSIS — D869 Sarcoidosis, unspecified: Secondary | ICD-10-CM | POA: Diagnosis not present

## 2015-08-27 DIAGNOSIS — I4891 Unspecified atrial fibrillation: Secondary | ICD-10-CM | POA: Diagnosis not present

## 2015-08-27 DIAGNOSIS — E785 Hyperlipidemia, unspecified: Secondary | ICD-10-CM | POA: Diagnosis not present

## 2015-08-27 DIAGNOSIS — J45909 Unspecified asthma, uncomplicated: Secondary | ICD-10-CM | POA: Diagnosis not present

## 2015-08-27 DIAGNOSIS — K439 Ventral hernia without obstruction or gangrene: Secondary | ICD-10-CM | POA: Diagnosis not present

## 2015-08-27 DIAGNOSIS — G4733 Obstructive sleep apnea (adult) (pediatric): Secondary | ICD-10-CM | POA: Diagnosis not present

## 2015-08-27 DIAGNOSIS — I10 Essential (primary) hypertension: Secondary | ICD-10-CM | POA: Diagnosis not present

## 2015-08-27 DIAGNOSIS — R911 Solitary pulmonary nodule: Secondary | ICD-10-CM | POA: Diagnosis not present

## 2015-08-27 DIAGNOSIS — I712 Thoracic aortic aneurysm, without rupture: Secondary | ICD-10-CM | POA: Diagnosis not present

## 2015-08-27 LAB — GLUCOSE, CAPILLARY: Glucose-Capillary: 104 mg/dL — ABNORMAL HIGH (ref 65–99)

## 2015-08-27 MED ORDER — TRAMADOL HCL 50 MG PO TABS: 50.0000 mg | ORAL_TABLET | Freq: Four times a day (QID) | ORAL | Status: DC | PRN

## 2015-08-27 NOTE — Progress Notes (Signed)
      [address]       [place] [address]             [phone]      1 Day Post-Op Procedure(s) (LRB): VENTRAL HERNIA REPAIR WITH MESH ADULT (N/A) Subjective: Feels well, minimal discomfort, passing flatus   Objective: Vital signs in last 24 hours: Temp:  [95.6 F (35.3 C)-98.7 F (37.1 C)] 97.9 F (36.6 C) (11/30 0528) Pulse Rate:  [63-92] 64 (11/30 0528) Cardiac Rhythm:  [-] Normal sinus rhythm (11/29 1900) Resp:  [10-16] 16 (11/30 0528) BP: (99-115)/(55-73) 105/61 mmHg (11/30 0528) SpO2:  [93 %-96 %] 96 % (11/30 0528) Weight:  [216 lb (97.977 kg)] 216 lb (97.977 kg) (11/29 1100)  Hemodynamic parameters for last 24 hours:    Intake/Output from previous day: 11/29 0701 - 11/30 0700 In: 2621 [P.O.:600; I.V.:2021] Out: 1520 [Urine:1495; Blood:25] Intake/Output this shift:    General appearance: alert, cooperative and no distress Heart: regular rate and rhythm Lungs: clear to auscultation bilaterally Abdomen: benign, + BS Extremities: trace edema Wound: incis healingwell  Lab Results: No results for input(s): WBC, HGB, HCT, PLT in the last 72 hours. BMET: No results for input(s): NA, K, CL, CO2, GLUCOSE, BUN, CREATININE, CALCIUM in the last 72 hours.  PT/INR: No results for input(s): LABPROT, INR in the last 72 hours. ABG    Component Value Date/Time   PHART 7.405 08/14/2015 1031   HCO3 23.0 08/14/2015 1031   TCO2 24.2 08/14/2015 1031   ACIDBASEDEF 1.0 08/14/2015 1031   O2SAT 97.2 08/14/2015 1031   CBG (last 3)   Recent Labs  08/26/15 1741 08/26/15 2318 08/27/15 0523  GLUCAP 130* 129* 104*    Meds Scheduled Meds: . acetaminophen  1,000 mg Oral 4 times per day   Or  . acetaminophen (TYLENOL) oral liquid 160 mg/5 mL  1,000 mg Oral 4 times per day  . aspirin EC  81 mg Oral Daily  . atorvastatin  10 mg Oral q1800  . brinzolamide  1 drop Both Eyes BID  . cefUROXime (ZINACEF)  IV  1.5 g Intravenous Q12H  . insulin aspart  0-24  Units Subcutaneous 4 times per day  . metoprolol tartrate  25 mg Oral BID   Continuous Infusions: . dextrose 5 % and 0.9% NaCl 100 mL/hr at 08/26/15 1616   PRN Meds:.fentaNYL (SUBLIMAZE) injection, ondansetron (ZOFRAN) IV, oxyCODONE, traMADol  Xrays Dg Chest 2 View  08/26/2015  CLINICAL DATA:  Hernia repair EXAM: CHEST  2 VIEW COMPARISON:  CT 02/20/2015 FINDINGS: There is prior sternotomy and aortic valvuloplasty. There is a shallow inspiration. Mild linear scarring is present in both bases without significant change from 08/15/2014. No confluent airspace opacity. No effusions. Normal pulmonary vasculature. IMPRESSION: No active cardiopulmonary disease. Electronically Signed   By: Andreas Newport M.D.   On: 08/26/2015 06:18    Assessment/Plan: S/P Procedure(s) (LRB): VENTRAL HERNIA REPAIR WITH MESH ADULT (N/A)   1 doing well, will d/c home today   LOS: 1 day    Elray Dains E 08/27/2015

## 2015-08-27 NOTE — Op Note (Signed)
NAMELEOCADIO, Jeremy Meadows NO.:  [id number]  MEDICAL RECORD NO.:  [id number]  LOCATION:  6N23C                        FACILITY:  [facility]  PHYSICIAN:  Lanelle Bal, MD    DATE OF BIRTH:  [birth year]  DATE OF PROCEDURE:  08/26/2015 DATE OF DISCHARGE:                              OPERATIVE REPORT   PREOPERATIVE DIAGNOSIS:  Small ventral hernia following sternotomy.  POSTOPERATIVE DIAGNOSIS:  Small ventral hernia following sternotomy.  SURGICAL PROCEDURE:  Repair of ventral hernia with polypropylene mesh.  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  During postop followup 6-8 months after surgery, he had noted bulging at the lower end of the sternal incision.  The patient remains active and has been playing Adult League softball around the country.  The bulge had slowly enlarged in size and was uncomfortable for the patient.  Although, there was no evidence of herniation or strangulation.  Surgical repair with mesh was recommended.  The patient agreed and signed informed consent.  DESCRIPTION OF PROCEDURE:  The patient underwent general endotracheal anesthesia.  After appropriate time-out was performed, the chest and upper abdomen were prepped with Betadine and draped in sterile manner. The incision over the lower sternum was opened.  The old scar was excised and dissection was carried down to approximately 3-cm defect. The fascial edge was dissected back and identified.  Carefully, we then selected a Ventralex ST hernia patch approximately 6 cm patch to repair this with.  The patch was sewed in placed below the fascia with interrupted horizontal mattress #1 Prolene sutures.  With the patch nicely in place, the overlying fascia was then also closed  with interrupted 0 Vicryl.  Subcutaneous tissue was closed with running 2-0 Vicryl and a running 3-0 subcuticular stitch in the skin edges. Dermabond was applied.  The patient tolerated the procedure without obvious complication.  Sponge and needle counts were reported as correct at the completion of procedure.  Bleeding and blood loss were minimal. The patient was extubated in the operating room and transferred to the recovery room for further postoperative care.     Lanelle Bal, MD    EG/MEDQ  D:  08/26/2015  T:  08/27/2015  Job:  [id number]

## 2015-08-27 NOTE — Progress Notes (Signed)
Pt discharged to home with instructions

## 2015-09-08 DIAGNOSIS — G4733 Obstructive sleep apnea (adult) (pediatric): Secondary | ICD-10-CM | POA: Diagnosis not present

## 2015-09-15 ENCOUNTER — Ambulatory Visit (INDEPENDENT_AMBULATORY_CARE_PROVIDER_SITE_OTHER): Payer: Self-pay | Admitting: Physician Assistant

## 2015-09-15 VITALS — BP 130/84 | HR 78 | Resp 20 | Ht 71.0 in | Wt 215.0 lb

## 2015-09-15 DIAGNOSIS — K432 Incisional hernia without obstruction or gangrene: Secondary | ICD-10-CM

## 2015-09-15 NOTE — Progress Notes (Signed)
  HPI:   Patient returns for routine postoperative follow-up having undergone repair of Ventral Hernia 08/26/2015. The patient's early postoperative recovery while in the hospital was unremarkable Since hospital discharge the patient reports he is doing well from the surgery.  However along his left side of his abdomen he notices a pain that feels like a bruise especially when he coughs or sneezes.  He denies all other complaints including fever, diarrhea, nausea, abdominal pain, and vomiting.  He questions activity level which I reviewed with him.   Current Outpatient Prescriptions  Medication Sig Dispense Refill  . albuterol (VENTOLIN HFA) 108 (90 BASE) MCG/ACT inhaler Inhale 2 puffs into the lungs every 6 (six) hours as needed for wheezing or shortness of breath. 1 Inhaler 0  . aspirin 81 MG tablet Take 81 mg by mouth daily.    [name] atorvastatin (LIPITOR) 10 MG tablet Take 1 tablet (10 mg total) by mouth daily at 6 PM. 90 tablet 2  . brinzolamide (AZOPT) 1 % ophthalmic suspension Place 1 drop into both eyes 2 (two) times daily.    . cholecalciferol (VITAMIN D) 1000 UNITS tablet Take 1,000 Units by mouth daily.    [name] HYDROcodone-homatropine (HYCODAN) 5-1.5 MG/5ML syrup Take 5 mLs by mouth at bedtime as needed. (Patient taking differently: Take 5 mLs by mouth at bedtime as needed for cough. ) 120 mL 0  . metoprolol tartrate (LOPRESSOR) 25 MG tablet Take 1 tablet (25 mg total) by mouth 2 (two) times daily. 180 tablet 2  . vitamin C (ASCORBIC ACID) 500 MG tablet Take 500 mg by mouth daily.      No current facility-administered medications for this visit.    Physical Exam:  BP 130/84 mmHg  Pulse 78  Resp 20  Ht 5\' 11"  (1.803 m)  Wt 215 lb (97.523 kg)  BMI 30.00 kg/m2  SpO2 98%  Gen: no general distress, alert cooperative Heart: RRR Lungs: CTA bilaterally Abd; soft non-tender, non-distended, no visible deformity seen or palpated in area of discomfort Skin: incision healing well, w/o signs  of infection  1. S/P Ventral hernia repair- doing well, instructed patient that should his pain continue with coughing and sneezing to seek treatment from PCP for possible CT scan to evaluate source 2. S/P AVR, Ascending Aortic Replacemetn- RTC in 1 year with repeat CT chest 3. RTC prn should further issues arise  Ellwood Handler, PA-C [facility] [phone]

## 2015-11-11 DIAGNOSIS — H5210 Myopia, unspecified eye: Secondary | ICD-10-CM | POA: Diagnosis not present

## 2015-12-05 DIAGNOSIS — G4733 Obstructive sleep apnea (adult) (pediatric): Secondary | ICD-10-CM | POA: Diagnosis not present

## 2016-01-20 ENCOUNTER — Encounter: Payer: Self-pay | Admitting: Gastroenterology

## 2016-03-11 ENCOUNTER — Encounter: Payer: Self-pay | Admitting: Family Medicine

## 2016-04-23 ENCOUNTER — Encounter: Payer: Self-pay | Admitting: Cardiology

## 2016-04-23 ENCOUNTER — Ambulatory Visit (INDEPENDENT_AMBULATORY_CARE_PROVIDER_SITE_OTHER): Payer: Commercial Managed Care - HMO | Admitting: Cardiology

## 2016-04-23 VITALS — BP 138/100 | HR 67 | Ht 71.0 in | Wt 207.0 lb

## 2016-04-23 DIAGNOSIS — E785 Hyperlipidemia, unspecified: Secondary | ICD-10-CM | POA: Diagnosis not present

## 2016-04-23 DIAGNOSIS — Z952 Presence of prosthetic heart valve: Secondary | ICD-10-CM

## 2016-04-23 DIAGNOSIS — I10 Essential (primary) hypertension: Secondary | ICD-10-CM | POA: Diagnosis not present

## 2016-04-23 DIAGNOSIS — Z954 Presence of other heart-valve replacement: Secondary | ICD-10-CM | POA: Diagnosis not present

## 2016-04-23 MED ORDER — METOPROLOL TARTRATE 25 MG PO TABS
25.0000 mg | ORAL_TABLET | Freq: Two times a day (BID) | ORAL | 3 refills | Status: DC
Start: 2016-04-23 — End: 2016-12-06

## 2016-04-23 MED ORDER — ATORVASTATIN CALCIUM 10 MG PO TABS: 10.0000 mg | ORAL_TABLET | Freq: Every day | ORAL | 3 refills | Status: DC

## 2016-04-23 NOTE — Patient Instructions (Signed)
Your physician wants you to follow-up in: 1 year or sooner if needed with Dr [name]. You will receive a reminder letter in the mail two months in advance. If you don't receive a letter, please call our office to schedule the follow-up appointment.  No changes have been made today in your therapy. Continue medications as prescribed.   Your cardiac medications were refilled to your [facility].

## 2016-04-23 NOTE — Progress Notes (Signed)
04/23/2016 Jeremy Meadows   [birth year]  [id number]  Primary Physician Ria Bush, MD Primary Cardiologist: Dr [name]  HPI:  70 y/o male with a history of aortic stenosis and  thoracic aortic aneurysm. He is s/p AVR and aortic aneurysm grafting by Dr. [name] on 07/02/14 with a #23 Magna Ease pericardial valve and 32 mm Hema shield graft. His post op course was complicated by atrial fibrillation that converted to NSR on amiodarone. Amiodarone was later discontinued and he has had no further arrhythmia. In Nov 2016 he had an incisional hernia repaired by Dr [name], he tolerated this well. The pt plays softball in a senior league and goes to a daily exercise class. He denies any chest pain or unusual dyspnea.    Current Outpatient Prescriptions  Medication Sig Dispense Refill  . albuterol (VENTOLIN HFA) 108 (90 BASE) MCG/ACT inhaler Inhale 2 puffs into the lungs every 6 (six) hours as needed for wheezing or shortness of breath. 1 Inhaler 0  . aspirin 81 MG tablet Take 81 mg by mouth daily.    [name] atorvastatin (LIPITOR) 10 MG tablet Take 1 tablet (10 mg total) by mouth daily at 6 PM. 90 tablet 3  . brinzolamide (AZOPT) 1 % ophthalmic suspension Place 1 drop into both eyes 2 (two) times daily.    . cholecalciferol (VITAMIN D) 1000 UNITS tablet Take 1,000 Units by mouth daily.    . metoprolol tartrate (LOPRESSOR) 25 MG tablet Take 1 tablet (25 mg total) by mouth 2 (two) times daily. 180 tablet 3  . vitamin C (ASCORBIC ACID) 500 MG tablet Take 500 mg by mouth daily.      No current facility-administered medications for this visit.     Allergies  Allergen Reactions  . Ace Inhibitors Cough    Hacking cough    Social History   Social History  . Marital status: Married    Spouse name: N/A  . Number of children: 2  . Years of education: N/A   Occupational History  . manager    Social History Main Topics  . Smoking status: Never Smoker  . Smokeless tobacco: Never Used  .  Alcohol use No  . Drug use: No  . Sexual activity: Not on file   Other Topics Concern  . Not on file   Social History Narrative   Lives with [name]   Occupation: retired, was in Conservator, 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   Activity: softball, walking   Diet: good water, fruits/vegetables daily     Review of Systems: General: negative for chills, fever, night sweats or weight changes.  Cardiovascular: negative for chest pain, dyspnea on exertion, edema, orthopnea, palpitations, paroxysmal nocturnal dyspnea or shortness of breath Dermatological: negative for rash Respiratory: negative for cough or wheezing Urologic: negative for hematuria Abdominal: negative for nausea, vomiting, diarrhea, bright red blood per rectum, melena, or hematemesis Neurologic: negative for visual changes, syncope, or dizziness All other systems reviewed and are otherwise negative except as noted above.    Blood pressure (!) 138/100, pulse 67, height 5\' 11"  (1.803 m), weight 207 lb (93.9 kg), SpO2 96 %.  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   Kerin Ransom PA-C 04/23/2016 1:55 PM

## 2016-04-23 NOTE — Assessment & Plan Note (Signed)
Controlled- repeat B/P by me 122/84

## 2016-04-23 NOTE — Assessment & Plan Note (Signed)
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

## 2016-04-23 NOTE — Assessment & Plan Note (Signed)
LDL 61 in Nov 2016

## 2016-07-28 ENCOUNTER — Ambulatory Visit (INDEPENDENT_AMBULATORY_CARE_PROVIDER_SITE_OTHER): Payer: Commercial Managed Care - HMO

## 2016-07-28 DIAGNOSIS — Z23 Encounter for immunization: Secondary | ICD-10-CM | POA: Diagnosis not present

## 2016-08-09 ENCOUNTER — Other Ambulatory Visit: Payer: Self-pay | Admitting: *Deleted

## 2016-08-09 ENCOUNTER — Encounter: Payer: Self-pay | Admitting: Family Medicine

## 2016-08-09 DIAGNOSIS — Z952 Presence of prosthetic heart valve: Secondary | ICD-10-CM

## 2016-08-09 DIAGNOSIS — I350 Nonrheumatic aortic (valve) stenosis: Secondary | ICD-10-CM

## 2016-08-09 DIAGNOSIS — I7121 Aneurysm of the ascending aorta, without rupture: Secondary | ICD-10-CM

## 2016-08-09 DIAGNOSIS — I712 Thoracic aortic aneurysm, without rupture: Secondary | ICD-10-CM

## 2016-08-09 NOTE — Telephone Encounter (Signed)
See pt mychart

## 2016-08-12 ENCOUNTER — Ambulatory Visit (INDEPENDENT_AMBULATORY_CARE_PROVIDER_SITE_OTHER): Payer: Commercial Managed Care - HMO

## 2016-08-12 VITALS — BP 120/82 | HR 72 | Temp 97.9°F | Ht 71.5 in | Wt 198.0 lb

## 2016-08-12 DIAGNOSIS — I10 Essential (primary) hypertension: Secondary | ICD-10-CM | POA: Diagnosis not present

## 2016-08-12 DIAGNOSIS — E7800 Pure hypercholesterolemia, unspecified: Secondary | ICD-10-CM

## 2016-08-12 DIAGNOSIS — Z125 Encounter for screening for malignant neoplasm of prostate: Secondary | ICD-10-CM

## 2016-08-12 DIAGNOSIS — Z0000 Encounter for general adult medical examination without abnormal findings: Secondary | ICD-10-CM | POA: Diagnosis not present

## 2016-08-12 DIAGNOSIS — R740 Nonspecific elevation of levels of transaminase and lactic acid dehydrogenase [LDH]: Secondary | ICD-10-CM

## 2016-08-12 DIAGNOSIS — R799 Abnormal finding of blood chemistry, unspecified: Secondary | ICD-10-CM | POA: Diagnosis not present

## 2016-08-12 DIAGNOSIS — Z1159 Encounter for screening for other viral diseases: Secondary | ICD-10-CM

## 2016-08-12 DIAGNOSIS — R7401 Elevation of levels of liver transaminase levels: Secondary | ICD-10-CM

## 2016-08-12 DIAGNOSIS — R7303 Prediabetes: Secondary | ICD-10-CM | POA: Diagnosis not present

## 2016-08-12 LAB — BASIC METABOLIC PANEL
BUN: 18 mg/dL (ref 6–23)
CHLORIDE: 105 meq/L (ref 96–112)
CO2: 29 mEq/L (ref 19–32)
CREATININE: 1.19 mg/dL (ref 0.40–1.50)
Calcium: 9.5 mg/dL (ref 8.4–10.5)
GFR: 64.09 mL/min (ref >60.00)
GLUCOSE: 89 mg/dL (ref 70–99)
Potassium: 4.5 mEq/L (ref 3.5–5.1)
Sodium: 140 mEq/L (ref 135–145)

## 2016-08-12 LAB — CBC WITH DIFFERENTIAL/PLATELET
BASOS ABS: 0 10*3/uL (ref 0.0–0.1)
Basophils Relative: 0.4 % (ref 0.0–3.0)
EOS ABS: 0.3 10*3/uL (ref 0.0–0.7)
Eosinophils Relative: 4 % (ref 0.0–5.0)
HEMATOCRIT: 46.8 % (ref 39.0–52.0)
HEMOGLOBIN: 15.9 g/dL (ref 13.0–17.0)
LYMPHS PCT: 24.5 % (ref 12.0–46.0)
Lymphs Abs: 1.9 10*3/uL (ref 0.7–4.0)
MCHC: 34 g/dL (ref 30.0–36.0)
MCV: 94.5 fl (ref 78.0–100.0)
MONOS PCT: 12.5 % — AB (ref 3.0–12.0)
Monocytes Absolute: 1 10*3/uL (ref 0.1–1.0)
Neutro Abs: 4.6 10*3/uL (ref 1.4–7.7)
Neutrophils Relative %: 58.6 % (ref 43.0–77.0)
Platelets: 162 10*3/uL (ref 150.0–400.0)
RBC: 4.95 Mil/uL (ref 4.22–5.81)
RDW: 13.2 % (ref 11.5–15.5)
WBC: 7.9 10*3/uL (ref 4.0–10.5)

## 2016-08-12 LAB — HEMOGLOBIN A1C: Hgb A1c MFr Bld: 5.5 % (ref 4.6–6.5)

## 2016-08-12 LAB — LIPID PANEL
CHOL/HDL RATIO: 4
CHOLESTEROL: 128 mg/dL (ref 0–200)
HDL: 31.8 mg/dL — ABNORMAL LOW (ref >39.00)
NonHDL: 95.79
Triglycerides: 259 mg/dL — ABNORMAL HIGH (ref 0.0–149.0)
VLDL: 51.8 mg/dL — ABNORMAL HIGH (ref 0.0–40.0)

## 2016-08-12 LAB — TSH: TSH: 1.36 u[IU]/mL (ref 0.35–4.50)

## 2016-08-12 LAB — LDL CHOLESTEROL, DIRECT: Direct LDL: 69 mg/dL

## 2016-08-12 LAB — HEPATIC FUNCTION PANEL
ALK PHOS: 77 U/L (ref 39–117)
ALT: 51 U/L (ref 0–53)
AST: 38 U/L — AB (ref 0–37)
Albumin: 4.4 g/dL (ref 3.5–5.2)
BILIRUBIN DIRECT: 0.2 mg/dL (ref 0.0–0.3)
BILIRUBIN TOTAL: 0.9 mg/dL (ref 0.2–1.2)
TOTAL PROTEIN: 6.9 g/dL (ref 6.0–8.3)

## 2016-08-12 LAB — PSA, MEDICARE: PSA: 1.06 ng/ml (ref 0.10–4.00)

## 2016-08-12 NOTE — Progress Notes (Signed)
Pre visit review using our clinic review tool, if applicable. No additional management support is needed unless otherwise documented below in the visit note. 

## 2016-08-12 NOTE — Progress Notes (Signed)
PCP notes:   Health maintenance:  Hep C screening - completed  Abnormal screenings:   Hearing - failed  Patient concerns:   None  Nurse concerns:  None  Next PCP appt:   09/16/16 @ 10AM

## 2016-08-12 NOTE — Patient Instructions (Addendum)
Mr. Laino , Thank you for taking time to come for your Medicare Wellness Visit. I appreciate your ongoing commitment to your health goals. Please review the following plan we discussed and let me know if I can assist you in the future.   These are the goals we discussed: Goals    . Increase physical activity          Starting 08/12/2016, I will continue to exercise at least 45 min 3 days per week.        This is a list of the screening recommended for you and due dates:  Health Maintenance  Topic Date Due  . Colon Cancer Screening  01/11/2017  . DTaP/Tdap/Td vaccine (2 - Td) 12/01/2021  . Tetanus Vaccine  12/01/2021  . Flu Shot  Completed  . Shingles Vaccine  Completed  .  Hepatitis C: One time screening is recommended by Center for Disease Control  (CDC) for  adults born from [birth year] through 1965.   Completed  . Pneumonia vaccines  Completed   Preventive Care for Adults  A healthy lifestyle and preventive care can promote health and wellness. Preventive health guidelines for adults include the following key practices.  . A routine yearly physical is a good way to check with your health care provider about your health and preventive screening. It is a chance to share any concerns and updates on your health and to receive a thorough exam.  . Visit your dentist for a routine exam and preventive care every 6 months. Brush your teeth twice a day and floss once a day. Good oral hygiene prevents tooth decay and gum disease.  . The frequency of eye exams is based on your age, health, family medical history, use  of contact lenses, and other factors. Follow your health care provider's ecommendations for frequency of eye exams.  . Eat a healthy diet. Foods like vegetables, fruits, whole grains, low-fat dairy products, and lean protein foods contain the nutrients you need without too many calories. Decrease your intake of foods high in solid fats, added sugars, and salt. Eat the right amount  of calories for you. Get information about a proper diet from your health care provider, if necessary.  . Regular physical exercise is one of the most important things you can do for your health. Most adults should get at least 150 minutes of moderate-intensity exercise (any activity that increases your heart rate and causes you to sweat) each week. In addition, most adults need muscle-strengthening exercises on 2 or more days a week.  Silver Sneakers may be a benefit available to you. To determine eligibility, you may visit the website: www.silversneakers.com or contact program at 973-112-5772 Mon-Fri between 8AM-8PM.   . Maintain a healthy weight. The body mass index (BMI) is a screening tool to identify possible weight problems. It provides an estimate of body fat based on height and weight. Your health care provider can find your BMI and can help you achieve or maintain a healthy weight.   For adults 20 years and older: ? A BMI below 18.5 is considered underweight. ? A BMI of 18.5 to 24.9 is normal. ? A BMI of 25 to 29.9 is considered overweight. ? A BMI of 30 and above is considered obese.   . Maintain normal blood lipids and cholesterol levels by exercising and minimizing your intake of saturated fat. Eat a balanced diet with plenty of fruit and vegetables. Blood tests for lipids and cholesterol should begin at age 39  and be repeated every 5 years. If your lipid or cholesterol levels are high, you are over 50, or you are at high risk for heart disease, you may need your cholesterol levels checked more frequently. Ongoing high lipid and cholesterol levels should be treated with medicines if diet and exercise are not working.  . If you smoke, find out from your health care provider how to quit. If you do not use tobacco, please do not start.  . If you choose to drink alcohol, please do not consume more than 2 drinks per day. One drink is considered to be 12 ounces (355 mL) of beer, 5 ounces  (148 mL) of wine, or 1.5 ounces (44 mL) of liquor.  . If you are 82-78 years old, ask your health care provider if you should take aspirin to prevent strokes.  . Use sunscreen. Apply sunscreen liberally and repeatedly throughout the day. You should seek shade when your shadow is shorter than you. Protect yourself by wearing long sleeves, pants, a wide-brimmed hat, and sunglasses year round, whenever you are outdoors.  . Once a month, do a whole body skin exam, using a mirror to look at the skin on your back. Tell your health care provider of new moles, moles that have irregular borders, moles that are larger than a pencil eraser, or moles that have changed in shape or color.

## 2016-08-12 NOTE — Progress Notes (Signed)
Subjective:   Jeremy Meadows is a 70 y.o. male who presents for Medicare Annual/Subsequent preventive examination.  Review of Systems:  N/A Cardiac Risk Factors include: advanced age (>53men, >39 women);male gender;dyslipidemia;hypertension     Objective:    Vitals: BP 120/82 (BP Location: Left Arm, Patient Position: Sitting, Cuff Size: Normal)   Pulse 72   Temp 97.9 F (36.6 C) (Oral)   Ht 5' 11.5" (1.816 m) Comment: no shoes  Wt 198 lb (89.8 kg)   SpO2 98%   BMI 27.23 kg/m   Body mass index is 27.23 kg/m.  Tobacco History  Smoking Status  . Never Smoker  Smokeless Tobacco  . Never Used     Counseling given: No   Past Medical History:  Diagnosis Date  . Allergic rhinitis, cause unspecified   . Aortic insufficiency    s/p AVR 2015  . Aortic root enlargement (HCC)    thoracic aorta  . Aortic valve stenosis 2015   s/p AVR 2015 [name]) - completed cardiac rehab 08/2014  . Arthritis   . Asthma   . Atrial fibrillation ([place]) 07/23/2014  . Benign neoplasm of colon   . History of kidney stones   . History of nephrolithiasis   . HLD (hyperlipidemia)   . Hypertension   . Need for prophylactic antibiotic   . Obstructive sleep apnea (adult) (pediatric)    pt. doesn't use his machine at home.  . Other testicular hypofunction   . Pulmonary nodule, left 2015   2cm, ?sarcoid by biopsy  . Unspecified glaucoma(365.9)    Past Surgical History:  Procedure Laterality Date  . AORTIC VALVE REPLACEMENT N/A 07/02/2014   Procedure: AORTIC VALVE REPLACEMENT (AVR) with 23 Aortic Magna Ease;  Surgeon: Grace Isaac, MD  . ASCENDING AORTIC ROOT REPLACEMENT N/A 07/02/2014   Procedure: supra coronary  ASCENDING AORTIC REPLACEMENT to the Inominate Artery with 52mm Hemashield Platinum with  circulatory arrest;  Surgeon: Grace Isaac, MD  . [place]    . CARDIAC VALVE REPLACEMENT    . CATARACT EXTRACTION Bilateral   . COLONOSCOPY  2013   WNL, rpt 5 yrs    . CORONARY ARTERY BYPASS GRAFT    . EYE SURGERY    . INTRAOPERATIVE TRANSESOPHAGEAL ECHOCARDIOGRAM N/A 07/02/2014  . JOINT REPLACEMENT Right 14   partial replacement  . KNEE SURGERY Right 05/2012   arthroscopic  . KNEE SURGERY Right 12/2013   patella  . LEFT AND RIGHT HEART CATHETERIZATION WITH CORONARY ANGIOGRAM N/A 05/22/2014   no significant obstructive CAD ([name])  . LITHOTRIPSY    . LUNG BIOPSY Left 2015   by IR - benign  . MEDIAL PARTIAL KNEE REPLACEMENT Right 03/2013  . RHINOPLASTY  1980s  . VENTRAL HERNIA REPAIR  08/26/2015  . VENTRAL HERNIA REPAIR N/A 08/26/2015   Procedure: VENTRAL HERNIA REPAIR WITH MESH ADULT;  Surgeon: Grace Isaac, MD;  [facility];  Service: Vascular;  Laterality: N/A;   Family History  Problem Relation Age of Onset  . CAD Father 62    MI  . Heart disease Brother   . Heart disease Brother   . Heart disease Brother   . Cancer Neg Hx    History  Sexual Activity  . Sexual activity: Yes    Outpatient Encounter Prescriptions as of 08/12/2016  Medication Sig  . albuterol (VENTOLIN HFA) 108 (90 BASE) MCG/ACT inhaler Inhale 2 puffs into the lungs every 6 (six) hours as needed for wheezing or shortness of breath.  [name]  aspirin 81 MG tablet Take 81 mg by mouth daily.  [name] atorvastatin (LIPITOR) 10 MG tablet Take 1 tablet (10 mg total) by mouth daily at 6 PM.  . brinzolamide (AZOPT) 1 % ophthalmic suspension Place 1 drop into both eyes 2 (two) times daily.  . cholecalciferol (VITAMIN D) 1000 UNITS tablet Take 1,000 Units by mouth daily.  . metoprolol tartrate (LOPRESSOR) 25 MG tablet Take 1 tablet (25 mg total) by mouth 2 (two) times daily.  . vitamin C (ASCORBIC ACID) 500 MG tablet Take 500 mg by mouth daily.    No facility-administered encounter medications on file as of 08/12/2016.     Activities of Daily Living In your present state of health, do you have any difficulty performing the following activities: 08/12/2016 08/26/2015  Hearing? N N   Vision? N N  Difficulty concentrating or making decisions? N N  Walking or climbing stairs? N N  Dressing or bathing? N N  Doing errands, shopping? N N  Preparing Food and eating ? N -  Using the Toilet? N -  In the past six months, have you accidently leaked urine? N -  Do you have problems with loss of bowel control? N -  Managing your Medications? N -  Managing your Finances? N -  Housekeeping or managing your Housekeeping? N -  Some recent data might be hidden    Patient Care Team: Ria Bush, MD as PCP - General (Family Medicine) Peter M [name], MD as Consulting Physician (Cardiology) Grace Isaac, MD as Consulting Physician (Cardiothoracic Surgery)   Assessment:     Hearing Screening   125Hz  250Hz  500Hz  1000Hz  2000Hz  3000Hz  4000Hz  6000Hz  8000Hz   Right ear:   40 0 40  0    Left ear:   40 40 40  0    Vision Screening Comments: Last vision exam in 2017 with Dr. Macarthur Critchley   Exercise Activities and Dietary recommendations Current Exercise Habits: Home exercise routine;Structured exercise class, Type of exercise: walking;calisthenics;strength training/weights;stretching;treadmill;Other - see comments (softball), Time (Minutes): 45, Frequency (Times/Week): 3, Weekly Exercise (Minutes/Week): 135, Intensity: Moderate, Exercise limited by: None identified  Goals    . Increase physical activity          Starting 08/12/2016, I will continue to exercise at least 45 min 3 days per week.       Fall Risk Fall Risk  08/12/2016 08/12/2015 08/05/2014 02/27/2013  Falls in the past year? No No No No   Depression Screen PHQ 2/9 Scores 08/12/2016 08/12/2015 08/05/2014 05/17/2014  PHQ - 2 Score 0 0 0 0    Cognitive Function MMSE - Mini Mental State Exam 08/12/2016  Orientation to time 5  Orientation to Place 5  Registration 3  Attention/ Calculation 0  Recall 3  Language- name 2 objects 0  Language- repeat 1  Language- follow 3 step command 3  Language- read & follow  direction 0  Write a sentence 0  Copy design 0  Total score 20     PLEASE NOTE: A Mini-Cog screen was completed. Maximum score is 20. A value of 0 denotes this part of [place] MMSE was not completed or the patient failed this part of the Mini-Cog screening.   Mini-Cog Screening Orientation to Time - Max 5 pts Orientation to Place - Max 5 pts Registration - Max 3 pts Recall - Max 3 pts Language Repeat - Max 1 pts Language Follow 3 Step Command - Max 3 pts     Immunization History  Administered Date(s) Administered  . Influenza Split 09/07/2011, 07/04/2012  . Influenza Whole 06/27/2010  . Influenza,inj,Quad PF,36+ Mos 07/03/2013, 06/18/2014, 07/10/2015, 07/28/2016  . Pneumococcal Conjugate-13 08/05/2014  . Pneumococcal Polysaccharide-23 06/27/2010, 12/02/2011  . Tdap 12/02/2011  . Zoster 09/12/2014   Screening Tests Health Maintenance  Topic Date Due  . COLONOSCOPY  01/11/2017  . DTaP/Tdap/Td (2 - Td) 12/01/2021  . TETANUS/TDAP  12/01/2021  . INFLUENZA VACCINE  Completed  . ZOSTAVAX  Completed  . Hepatitis C Screening  Completed  . PNA vac Low Risk Adult  Completed      Plan:     I have personally reviewed and addressed the Medicare Annual Wellness questionnaire and have noted the following in the patient's chart:  A. Medical and social history B. Use of alcohol, tobacco or illicit drugs  C. Current medications and supplements D. Functional ability and status E.  Nutritional status F.  Physical activity G. Advance directives H. List of other physicians I.  Hospitalizations, surgeries, and ER visits in previous 12 months J.  [place] to include hearing, vision, cognitive, depression L. Referrals and appointments - none  In addition, I have reviewed and discussed with patient certain preventive protocols, quality metrics, and best practice recommendations. A written personalized care plan for preventive services as well as general preventive health  recommendations were provided to patient.  See attached scanned questionnaire for additional information.   Signed,   Lindell Noe, MHA, BS, LPN Health Coach

## 2016-08-13 LAB — HEPATITIS C ANTIBODY: HCV AB: NEGATIVE

## 2016-08-22 NOTE — Progress Notes (Signed)
I reviewed health advisor's note, was available for consultation, and agree with documentation and plan.  

## 2016-09-15 ENCOUNTER — Ambulatory Visit
Admission: RE | Admit: 2016-09-15 | Discharge: 2016-09-15 | Disposition: A | Discharge status: Home / Self Care | Payer: Commercial Managed Care - HMO | Source: Ambulatory Visit | Attending: Cardiothoracic Surgery | Admitting: Cardiothoracic Surgery

## 2016-09-15 ENCOUNTER — Encounter: Payer: Self-pay | Admitting: Cardiothoracic Surgery

## 2016-09-15 ENCOUNTER — Ambulatory Visit (INDEPENDENT_AMBULATORY_CARE_PROVIDER_SITE_OTHER): Payer: Commercial Managed Care - HMO | Admitting: Cardiothoracic Surgery

## 2016-09-15 VITALS — BP 131/86 | HR 72 | Resp 16 | Ht 71.5 in | Wt 198.0 lb

## 2016-09-15 DIAGNOSIS — Z9889 Other specified postprocedural states: Secondary | ICD-10-CM

## 2016-09-15 DIAGNOSIS — Z8679 Personal history of other diseases of the circulatory system: Secondary | ICD-10-CM | POA: Diagnosis not present

## 2016-09-15 DIAGNOSIS — Z952 Presence of prosthetic heart valve: Secondary | ICD-10-CM | POA: Diagnosis not present

## 2016-09-15 DIAGNOSIS — Z8719 Personal history of other diseases of the digestive system: Secondary | ICD-10-CM | POA: Diagnosis not present

## 2016-09-15 DIAGNOSIS — I350 Nonrheumatic aortic (valve) stenosis: Secondary | ICD-10-CM

## 2016-09-15 DIAGNOSIS — J984 Other disorders of lung: Secondary | ICD-10-CM | POA: Diagnosis not present

## 2016-09-15 NOTE — Progress Notes (Signed)
Jeremy Meadows       [place],Jeremy Meadows             [phone]      Jeremy Meadows [facility] Medical Record Jeremy Meadows Date of Birth: 11-25-1945  Referring: Jeremy Bush, MD Primary Care: Jeremy Bush, MD  Chief Complaint:   POST OP FOLLOW UP 07/02/2014 OPERATIVE REPORT PREOPERATIVE DIAGNOSES: 1. Critical aortic stenosis. 2. Ascending aortic aneurysm. 3. 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  POSTOPERATIVE DIAGNOSES: 1. Critical aortic stenosis. 2. Ascending aortic aneurysm. 3. 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  PROCEDURE: Aortic valve replacement with Jeremy Meadows pericardial tissue valve 23 mm, model 3300TFX, serial [id number]. Supracoronary replacement of ascending aorta with Hemashield graft 32 mm with cardiopulmonary bypass and circulatory arrest. Right axillary arterial cannulation.  History of Present Illness:     Patient doing well postoperatively, he's had no evidence of congestive heart failure or angina. He's had no episodes of recurrent atrial fibrillation.  Patient is now back to full activities and actively playing softball with the adult league. He played 70  games last season without difficulty.     Past Medical History:  Diagnosis Date  . Allergic rhinitis, cause unspecified   . Aortic insufficiency    s/p AVR 2015  . Aortic root enlargement (HCC)    thoracic aorta  . Aortic valve stenosis 2015   s/p AVR 2015 Jeremy Meadows) - completed cardiac rehab 08/2014  . Arthritis   . Asthma   . Atrial fibrillation ([place]) 07/23/2014  . Benign neoplasm of colon   . History of kidney stones   . History of nephrolithiasis   . HLD (hyperlipidemia)   . Hypertension   . Need for prophylactic antibiotic   . Obstructive sleep apnea (adult) (pediatric)    pt. doesn't use his machine at home.  . Other testicular hypofunction   . Pulmonary nodule, left 2015   2cm, ?sarcoid by biopsy    . Unspecified glaucoma(365.9)      History  Smoking Status  . Never Smoker  Smokeless Tobacco  . Never Used    History  Alcohol Use No     Allergies  Allergen Reactions  . Ace Inhibitors Cough    Hacking cough    Current Outpatient Prescriptions  Medication Sig Dispense Refill  . albuterol (VENTOLIN HFA) 108 (90 BASE) MCG/ACT inhaler Inhale 2 puffs into the lungs every 6 (six) hours as needed for wheezing or shortness of breath. 1 Inhaler 0  . aspirin 81 MG tablet Take 81 mg by mouth daily.    [name] atorvastatin (LIPITOR) 10 MG tablet Take 1 tablet (10 mg total) by mouth daily at 6 PM. 90 tablet 3  . brinzolamide (AZOPT) 1 % ophthalmic suspension Place 1 drop into both eyes 2 (two) times daily.    . cholecalciferol (VITAMIN D) 1000 UNITS tablet Take 1,000 Units by mouth daily.    . metoprolol tartrate (LOPRESSOR) 25 MG tablet Take 1 tablet (25 mg total) by mouth 2 (two) times daily. 180 tablet 3  . vitamin C (ASCORBIC ACID) 500 MG tablet Take 500 mg by mouth daily.      No current facility-administered medications for this visit.        Physical Exam: BP 131/86 (BP Location: Right Arm, Patient Position: Sitting, Cuff Size: Large)   Pulse 72   Resp 16   Ht 5' 11.5" (1.816 m)   Wt 198 lb (89.8  kg)   SpO2 96% Comment: ON RA  BMI 27.23 kg/m   General appearance: alert and cooperative Neurologic: intact Heart: regular rate and rhythm, S1, S2 normal, no murmur, click, rub or gallop Lungs: clear to auscultation bilaterally Abdomen: soft, non-tender; bowel sounds normal; no masses,  no organomegaly Extremities: extremities normal, atraumatic, no cyanosis or edema and Homans sign is negative, no sign of DVT Wound: Sternum is stable and well-healed Patient has a 2 cm bulge at the lower end of the sternal incision consistent with an epigastric incisional hernia He has full strength and function in his right arm .the right infraclavicular cannulation site incisions  well-healed  Well healed  incisional hernia incision  Without recurrance  Diagnostic Studies & Laboratory data:     Recent Radiology Findings:  Ct Chest Wo Contrast  Result Date: 09/15/2016 CLINICAL DATA:  Non rheumatic aortic valve stenosis. Status post aortic valve replacement and ascending aortic aneurysm repair approximately 2 years ago . EXAM: CT CHEST WITHOUT CONTRAST TECHNIQUE: Multidetector CT imaging of the chest was performed following the standard protocol without IV contrast. COMPARISON:  02/20/2015 FINDINGS: Cardiovascular: No acute findings. Previous aortic valve replacement. Stable appearance of the ascending aortic graft. Distal ascending aorta along the distal aspect of the graft measures 4.5 cm in maximum diameter, which remains stable. No evidence of mediastinal hematoma. Aortic and coronary artery atherosclerotic calcification noted. Normal heart size. No evidence pericardial effusion. Mediastinum/Nodes: No masses or pathologically enlarged lymph nodes identified on this unenhanced exam. Lungs/Pleura: No pulmonary infiltrate or mass identified. Mild scarring noted posterior left lower lobe. No effusion present. Upper Abdomen:  Small calcified gallstones incidentally noted. Musculoskeletal:  No suspicious bone lesions. IMPRESSION: Previous aortic valve replacement. Stable appearance of ascending aortic graft. Stable mild dilatation of distal ascending aorta along the distal aspect of the graft, measuring 4.5 cm in maximum diameter. No acute findings. Incidentally noted aortic and coronary atherosclerosis and cholelithiasis. Electronically Signed   By: Earle Gell M.D.   On: 09/15/2016 14:38      Ct Chest Wo Contrast  02/20/2015   CLINICAL DATA:  Followup left lower lobe pulmonary nodule.  EXAM: CT CHEST WITHOUT CONTRAST  TECHNIQUE: Multidetector CT imaging of the chest was performed following the standard protocol without IV contrast.  COMPARISON:  Chest CT 03/13/2014 and PET-CT  04/09/2014  FINDINGS: Chest wall: No chest wall mass, supraclavicular or axillary adenopathy. The thyroid gland is normal. The bony thorax is intact. Stable surgical changes with median sternotomy wires. Stable mid thoracic compression deformities.  Mediastinum: Surgical changes from aortic valve replacement surgery and ascending aortic graft. No complicating features are demonstrated. The descending thoracic aorta is normal in caliber. Stable scattered atherosclerotic calcifications. The esophagus is grossly normal. The heart is normal in size. No pericardial effusion. No mediastinal or hilar mass or adenopathy. The esophagus is grossly normal.  Lungs/ pleura: Knee irregular nodular density in the left lower lobe has resolved. No new or worrisome pulmonary lesions. No acute pulmonary findings. No pleural effusion.  Upper abdomen:  Stable cholelithiasis.  IMPRESSION: 1. Resolution of left lower lobe pulmonary lesion. No new or worrisome pulmonary findings. 2. Status post aortic valve replacement surgery and ascending aortic graft. No complicating features.   Electronically Signed   By: Marijo Sanes M.D.   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

## 2016-09-16 ENCOUNTER — Ambulatory Visit (INDEPENDENT_AMBULATORY_CARE_PROVIDER_SITE_OTHER): Payer: Commercial Managed Care - HMO | Admitting: Family Medicine

## 2016-09-16 ENCOUNTER — Other Ambulatory Visit: Payer: Commercial Managed Care - HMO

## 2016-09-16 ENCOUNTER — Encounter: Payer: Self-pay | Admitting: Family Medicine

## 2016-09-16 ENCOUNTER — Ambulatory Visit: Payer: Commercial Managed Care - HMO | Admitting: Cardiothoracic Surgery

## 2016-09-16 VITALS — BP 116/82 | HR 66 | Temp 97.9°F | Ht 71.5 in | Wt 214.0 lb

## 2016-09-16 DIAGNOSIS — I10 Essential (primary) hypertension: Secondary | ICD-10-CM | POA: Diagnosis not present

## 2016-09-16 DIAGNOSIS — E7800 Pure hypercholesterolemia, unspecified: Secondary | ICD-10-CM | POA: Diagnosis not present

## 2016-09-16 DIAGNOSIS — I7121 Aneurysm of the ascending aorta, without rupture: Secondary | ICD-10-CM

## 2016-09-16 DIAGNOSIS — Z952 Presence of prosthetic heart valve: Secondary | ICD-10-CM

## 2016-09-16 DIAGNOSIS — I700 Atherosclerosis of aorta: Secondary | ICD-10-CM

## 2016-09-16 DIAGNOSIS — Z7189 Other specified counseling: Secondary | ICD-10-CM

## 2016-09-16 DIAGNOSIS — H409 Unspecified glaucoma: Secondary | ICD-10-CM

## 2016-09-16 DIAGNOSIS — D869 Sarcoidosis, unspecified: Secondary | ICD-10-CM

## 2016-09-16 DIAGNOSIS — Z0000 Encounter for general adult medical examination without abnormal findings: Secondary | ICD-10-CM

## 2016-09-16 DIAGNOSIS — I2510 Atherosclerotic heart disease of native coronary artery without angina pectoris: Secondary | ICD-10-CM

## 2016-09-16 DIAGNOSIS — R7303 Prediabetes: Secondary | ICD-10-CM

## 2016-09-16 DIAGNOSIS — I4891 Unspecified atrial fibrillation: Secondary | ICD-10-CM

## 2016-09-16 DIAGNOSIS — I712 Thoracic aortic aneurysm, without rupture: Secondary | ICD-10-CM

## 2016-09-16 NOTE — Assessment & Plan Note (Signed)
Preventative protocols reviewed and updated unless pt declined. Discussed healthy diet and lifestyle.  

## 2016-09-16 NOTE — Progress Notes (Addendum)
BP 116/82 (BP Location: Left Arm, Patient Position: Sitting, Cuff Size: Normal)   Pulse 66   Temp 97.9 F (36.6 C) (Oral)   Ht 5' 11.5" (1.816 m)   Wt 214 lb (97.1 kg)   SpO2 99%   BMI 29.43 kg/m    CC: CPE Subjective:    Patient ID: Jeremy Meadows, male    DOB: 08/24/1946, 70 y.o.   MRN: [id number]  HPI: Jeremy Meadows is a 70 y.o. male presenting on 09/16/2016 for Annual Exam (AWV with Lattie Haw on 08/12/16)   Annia Belt last week for medicare wellness visit, note reviewed.  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 Needs endocarditis ppx. Released from CVTS care.   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 Planned screen today then stop.  Flu shot yearly Pneumovax 2013, prevnar 07/2014 Tdap 11/2011 zostavax - 08/2014 Advanced directives: living will at home, [name] is [place]. Asked to bring me a copy for chart. Seat belt use discussed.  Sunscreen use discussed. No changing moles on skin.  Non smoker Alcohol - none  Lives with [name] Occupation: retired, was in 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 Activity: softball, walking Diet: good water, fruits/vegetables daily  Relevant past medical, surgical, family and social history reviewed and updated as indicated. Interim medical history since our last visit reviewed. Allergies and medications reviewed and updated. Current Outpatient Prescriptions on File Prior to Visit  Medication Sig  . albuterol (VENTOLIN HFA) 108 (90 BASE) MCG/ACT inhaler Inhale 2 puffs into the lungs every 6 (six) hours as needed for wheezing or shortness of breath.  [name] aspirin 81 MG tablet Take 81 mg by mouth daily.  [name] atorvastatin (LIPITOR) 10 MG tablet Take 1 tablet (10 mg total) by mouth daily at 6 PM.  . brinzolamide (AZOPT) 1 % ophthalmic suspension Place 1 drop into both eyes 2 (two) times daily.  . cholecalciferol  (VITAMIN D) 1000 UNITS tablet Take 1,000 Units by mouth daily.  . metoprolol tartrate (LOPRESSOR) 25 MG tablet Take 1 tablet (25 mg total) by mouth 2 (two) times daily.  . vitamin C (ASCORBIC ACID) 500 MG tablet Take 500 mg by mouth daily.    No current facility-administered medications on file prior to visit.     Review of Systems  Constitutional: Negative for activity change, appetite change, chills, fatigue, fever and unexpected weight change.  HENT: Negative for hearing loss.   Eyes: Negative for visual disturbance.  Respiratory: Negative for cough, chest tightness, shortness of breath and wheezing.   Cardiovascular: Negative for chest pain, palpitations and leg swelling.  Gastrointestinal: Negative for abdominal distention, abdominal pain, blood in stool, constipation, diarrhea, nausea and vomiting.  Genitourinary: Negative for difficulty urinating and hematuria.  Musculoskeletal: Negative for arthralgias, myalgias and neck pain.  Skin: Negative for rash.  Neurological: Negative for dizziness, seizures, syncope and headaches.  Hematological: Negative for adenopathy. Does not bruise/bleed easily.  Psychiatric/Behavioral: Negative for dysphoric mood. The patient is not nervous/anxious.    Per HPI unless specifically indicated in ROS section     Objective:    BP 116/82 (BP Location: Left Arm, Patient Position: Sitting, Cuff Size: Normal)   Pulse 66   Temp 97.9 F (36.6 C) (Oral)   Ht 5' 11.5" (1.816 m)   Wt 214 lb (97.1 kg)   SpO2 99%   BMI 29.43 kg/m   Wt Readings  from Last 3 Encounters:  09/16/16 214 lb (97.1 kg)  09/15/16 198 lb (89.8 kg)  08/12/16 198 lb (89.8 kg)    Physical Exam  Constitutional: He is oriented to person, place, and time. He appears well-developed and well-nourished. No distress.  HENT:  Head: Normocephalic and atraumatic.  Right Ear: Hearing, tympanic membrane, external ear and ear canal normal.  Left Ear: Hearing, tympanic membrane, external ear  and ear canal normal.  Nose: Nose normal.  Mouth/Throat: Uvula is midline, oropharynx is clear and moist and mucous membranes are normal. No oropharyngeal exudate, posterior oropharyngeal edema or posterior oropharyngeal erythema.  Eyes: Conjunctivae and EOM are normal. Pupils are equal, round, and reactive to light. No scleral icterus.  Neck: Normal range of motion. Neck supple. Carotid bruit is not present. No thyromegaly present.  Cardiovascular: Normal rate, regular rhythm, normal heart sounds and intact distal pulses.   No murmur heard. Pulses:      Radial pulses are 2+ on the right side, and 2+ on the left side.  Pulmonary/Chest: Effort normal and breath sounds normal. No respiratory distress. He has no wheezes. He has no rales.  Abdominal: Soft. Bowel sounds are normal. He exhibits no distension and no mass. There is no tenderness. There is no rebound and no guarding.  Genitourinary: Prostate normal. Rectal exam shows external hemorrhoid (non inflamed). Rectal exam shows no internal hemorrhoid, no fissure, no mass, no tenderness and anal tone normal. Prostate is not enlarged (15gm) and not tender.  Musculoskeletal: Normal range of motion. He exhibits no edema.  Lymphadenopathy:    He has no cervical adenopathy.  Neurological: He is alert and oriented to person, place, and time.  CN grossly intact, station and gait intact  Skin: Skin is warm and dry. No rash noted.  Psychiatric: He has a normal mood and affect. His behavior is normal. Judgment and thought content normal.  Nursing note and vitals reviewed.  Results for orders placed or performed in visit on 08/12/16  PSA, Medicare  Result Value Ref Range   PSA 1.06 0.10 - 4.00 ng/ml  Hemoglobin A1c  Result Value Ref Range   Hgb A1c MFr Bld 5.5 4.6 - 6.5 %  Basic Metabolic Panel  Result Value Ref Range   Sodium 140 135 - 145 mEq/L   Potassium 4.5 3.5 - 5.1 mEq/L   Chloride 105 96 - 112 mEq/L   CO2 29 19 - 32 mEq/L   Glucose, Bld  89 70 - 99 mg/dL   BUN 18 6 - 23 mg/dL   Creatinine, Ser 1.19 0.40 - 1.50 mg/dL   Calcium 9.5 8.4 - 10.5 mg/dL   GFR 64.09 >60.00 mL/min  Lipid Panel  Result Value Ref Range   Cholesterol 128 0 - 200 mg/dL   Triglycerides 259.0 (H) 0.0 - 149.0 mg/dL   HDL 31.80 (L) >39.00 mg/dL   VLDL 51.8 (H) 0.0 - 40.0 mg/dL   Total CHOL/HDL Ratio 4    NonHDL 95.79   CBC with Differential/Platelet  Result Value Ref Range   WBC 7.9 4.0 - 10.5 K/uL   RBC 4.95 4.22 - 5.81 Mil/uL   Hemoglobin 15.9 13.0 - 17.0 g/dL   HCT 46.8 39.0 - 52.0 %   MCV 94.5 78.0 - 100.0 fl   MCHC 34.0 30.0 - 36.0 g/dL   RDW 13.2 11.5 - 15.5 %   Platelets 162.0 150.0 - 400.0 K/uL   Neutrophils Relative % 58.6 43.0 - 77.0 %   Lymphocytes Relative 24.5 12.0 -  46.0 %   Monocytes Relative 12.5 (H) 3.0 - 12.0 %   Eosinophils Relative 4.0 0.0 - 5.0 %   Basophils Relative 0.4 0.0 - 3.0 %   Neutro Abs 4.6 1.4 - 7.7 K/uL   Lymphs Abs 1.9 0.7 - 4.0 K/uL   Monocytes Absolute 1.0 0.1 - 1.0 K/uL   Eosinophils Absolute 0.3 0.0 - 0.7 K/uL   Basophils Absolute 0.0 0.0 - 0.1 K/uL  TSH  Result Value Ref Range   TSH 1.36 0.35 - 4.50 uIU/mL  Hepatitis C antibody  Result Value Ref Range   HCV Ab NEGATIVE NEGATIVE  Hepatic Function Panel  Result Value Ref Range   Total Bilirubin 0.9 0.2 - 1.2 mg/dL   Bilirubin, Direct 0.2 0.0 - 0.3 mg/dL   Alkaline Phosphatase 77 39 - 117 U/L   AST 38 (H) 0 - 37 U/L   ALT 51 0 - 53 U/L   Total Protein 6.9 6.0 - 8.3 g/dL   Albumin 4.4 3.5 - 5.2 g/dL  LDL cholesterol, direct  Result Value Ref Range   Direct LDL 69.0 mg/dL      Assessment & Plan:   Problem List Items Addressed This Visit    Advanced care planning/counseling discussion    Advanced directives: living will at home, [name] is [place]. Asked to bring me a copy for chart.      CAD (coronary artery disease)   Essential hypertension    Chronic, stable. Continue current regimen.       Glaucoma    Sees ophtho yearly, on azopt  drops.       Health maintenance examination - Primary    Preventative protocols reviewed and updated unless pt declined. Discussed healthy diet and lifestyle.       HYPERCHOLESTEROLEMIA    Triglycerides elevated compared to last year. LDL stable on lipitor. No changes today. Reviewed diet and lifestyle changes to improve trigs. Weight gain noted.      Lone atrial fibrillation (HCC)    No recurrence.       Prediabetes    A1c has improved to normal range despite increase in weight noted. Continue to monitor.       S/P AVR (aortic valve replacement) and aortoplasty    Released from CVTS care. Needs endocarditis ppx.      Sarcoidosis ([place])    Stable period      Thoracic aortic atherosclerosis [facility])   Thoracic ascending aortic aneurysm ([place])    repaired 2015          Follow up plan: Return in about 1 year (around 09/16/2017) for annual exam, prior fasting for blood work.  Ria Bush, MD

## 2016-09-16 NOTE — Progress Notes (Signed)
Pre visit review using our clinic review tool, if applicable. No additional management support is needed unless otherwise documented below in the visit note. 

## 2016-09-16 NOTE — Assessment & Plan Note (Addendum)
Triglycerides elevated compared to last year. LDL stable on lipitor. No changes today. Reviewed diet and lifestyle changes to improve trigs. Weight gain noted.

## 2016-09-16 NOTE — Assessment & Plan Note (Signed)
Released from CVTS care. Needs endocarditis ppx.

## 2016-09-16 NOTE — Assessment & Plan Note (Signed)
A1c has improved to normal range despite increase in weight noted. Continue to monitor.

## 2016-09-16 NOTE — Assessment & Plan Note (Signed)
Advanced directives: living will at home, [name] is [place]. Asked to bring me a copy for chart.

## 2016-09-16 NOTE — Assessment & Plan Note (Signed)
Chronic, stable. Continue current regimen. 

## 2016-09-16 NOTE — Patient Instructions (Addendum)
Bring me copy of your living will. Increase fatty fish in diet. You are doing well today. Return as needed or in 1 year for next physical.  Health Maintenance, Male A healthy lifestyle and preventative care can promote health and wellness.  Maintain regular health, dental, and eye exams.  Eat a healthy diet. Foods like vegetables, fruits, whole grains, low-fat dairy products, and lean protein foods contain the nutrients you need and are low in calories. Decrease your intake of foods high in solid fats, added sugars, and salt. Get information about a proper diet from your health care provider, if necessary.  Regular physical exercise is one of the most important things you can do for your health. Most adults should get at least 150 minutes of moderate-intensity exercise (any activity that increases your heart rate and causes you to sweat) each week. In addition, most adults need muscle-strengthening exercises on 2 or more days a week.   Maintain a healthy weight. The body mass index (BMI) is a screening tool to identify possible weight problems. It provides an estimate of body fat based on height and weight. Your health care provider can find your BMI and can help you achieve or maintain a healthy weight. For males 20 years and older:  A BMI below 18.5 is considered underweight.  A BMI of 18.5 to 24.9 is normal.  A BMI of 25 to 29.9 is considered overweight.  A BMI of 30 and above is considered obese.  Maintain normal blood lipids and cholesterol by exercising and minimizing your intake of saturated fat. Eat a balanced diet with plenty of fruits and vegetables. Blood tests for lipids and cholesterol should begin at age 40 and be repeated every 5 years. If your lipid or cholesterol levels are high, you are over age 28, or you are at high risk for heart disease, you may need your cholesterol levels checked more frequently.Ongoing high lipid and cholesterol levels should be treated with  medicines if diet and exercise are not working.  If you smoke, find out from your health care provider how to quit. If you do not use tobacco, do not start.  Lung cancer screening is recommended for adults aged 32-80 years who are at high risk for developing lung cancer because of a history of smoking. A yearly low-dose CT scan of the lungs is recommended for people who have at least a 30-pack-year history of smoking and are current smokers or have quit within the past 15 years. A pack year of smoking is smoking an average of 1 pack of cigarettes a day for 1 year (for example, a 30-pack-year history of smoking could mean smoking 1 pack a day for 30 years or 2 packs a day for 15 years). Yearly screening should continue until the smoker has stopped smoking for at least 15 years. Yearly screening should be stopped for people who develop a health problem that would prevent them from having lung cancer treatment.  If you choose to drink alcohol, do not have more than 2 drinks per day. One drink is considered to be 12 oz (360 mL) of beer, 5 oz (150 mL) of wine, or 1.5 oz (45 mL) of liquor.  Avoid the use of street drugs. Do not share needles with anyone. Ask for help if you need support or instructions about stopping the use of drugs.  High blood pressure causes heart disease and increases the risk of stroke. High blood pressure is more likely to develop in:  People  who have blood pressure in the end of the normal range (100-139/85-89 mm Hg).  People who are overweight or obese.  People who are African American.  If you are 30-2 years of age, have your blood pressure checked every 3-5 years. If you are 48 years of age or older, have your blood pressure checked every year. You should have your blood pressure measured twice-once when you are at a hospital or clinic, and once when you are not at a hospital or clinic. Record the average of the two measurements. To check your blood pressure when you are not  at a hospital or clinic, you can use:  An automated blood pressure machine at a pharmacy.  A home blood pressure monitor.  If you are 44-1 years old, ask your health care provider if you should take aspirin to prevent heart disease.  Diabetes screening involves taking a blood sample to check your fasting blood sugar level. This should be done once every 3 years after age 30 if you are at a normal weight and without risk factors for diabetes. Testing should be considered at a younger age or be carried out more frequently if you are overweight and have at least 1 risk factor for diabetes.  Colorectal cancer can be detected and often prevented. Most routine colorectal cancer screening begins at the age of 46 and continues through age 81. However, your health care provider may recommend screening at an earlier age if you have risk factors for colon cancer. On a yearly basis, your health care provider may provide home test kits to check for hidden blood in the stool. A small camera at the end of a tube may be used to directly examine the colon (sigmoidoscopy or colonoscopy) to detect the earliest forms of colorectal cancer. Talk to your health care provider about this at age 39 when routine screening begins. A direct exam of the colon should be repeated every 5-10 years through age 71, unless early forms of precancerous polyps or small growths are found.  People who are at an increased risk for hepatitis B should be screened for this virus. You are considered at high risk for hepatitis B if:  You were born in a country where hepatitis B occurs often. Talk with your health care provider about which countries are considered high risk.  Your parents were born in a high-risk country and you have not received a shot to protect against hepatitis B (hepatitis B vaccine).  You have HIV or AIDS.  You use needles to inject street drugs.  You live with, or have sex with, someone who has hepatitis B.  You  are a man who has sex with other men (MSM).  You get hemodialysis treatment.  You take certain medicines for conditions like cancer, organ transplantation, and autoimmune conditions.  Hepatitis C blood testing is recommended for all people born from [birth year] through 1965 and any individual with known risk factors for hepatitis C.  Healthy men should no longer receive prostate-specific antigen (PSA) blood tests as part of routine cancer screening. Talk to your health care provider about prostate cancer screening.  Testicular cancer screening is not recommended for adolescents or adult males who have no symptoms. Screening includes self-exam, a health care provider exam, and other screening tests. Consult with your health care provider about any symptoms you have or any concerns you have about testicular cancer.  Practice safe sex. Use condoms and avoid high-risk sexual practices to reduce the spread of sexually  transmitted infections (STIs).  You should be screened for STIs, including gonorrhea and chlamydia if:  You are sexually active and are younger than 24 years.  You are older than 24 years, and your health care provider tells you that you are at risk for this type of infection.  Your sexual activity has changed since you were last screened, and you are at an increased risk for chlamydia or gonorrhea. Ask your health care provider if you are at risk.  If you are at risk of being infected with HIV, it is recommended that you take a prescription medicine daily to prevent HIV infection. This is called pre-exposure prophylaxis (PrEP). You are considered at risk if:  You are a man who has sex with other men (MSM).  You are a heterosexual man who is sexually active with multiple partners.  You take drugs by injection.  You are sexually active with a partner who has HIV.  Talk with your health care provider about whether you are at high risk of being infected with HIV. If you choose to begin  PrEP, you should first be tested for HIV. You should then be tested every 3 months for as long as you are taking PrEP.  Use sunscreen. Apply sunscreen liberally and repeatedly throughout the day. You should seek shade when your shadow is shorter than you. Protect yourself by wearing long sleeves, pants, a wide-brimmed hat, and sunglasses year round whenever you are outdoors.  Tell your health care provider of new moles or changes in moles, especially if there is a change in shape or color. Also, tell your health care provider if a mole is larger than the size of a pencil eraser.  A one-time screening for abdominal aortic aneurysm (AAA) and surgical repair of large AAAs by ultrasound is recommended for men aged 25-75 years who are current or former smokers.  Stay current with your vaccines (immunizations). This information is not intended to replace advice given to you by your health care provider. Make sure you discuss any questions you have with your health care provider. Document Released: 03/11/2008 Document Revised: 10/04/2014 Document Reviewed: 06/17/2015 Elsevier Interactive Patient Education  2017 [employer].

## 2016-09-27 HISTORY — PX: COLONOSCOPY: SHX174

## 2016-10-17 DIAGNOSIS — I2510 Atherosclerotic heart disease of native coronary artery without angina pectoris: Secondary | ICD-10-CM | POA: Insufficient documentation

## 2016-10-17 DIAGNOSIS — I700 Atherosclerosis of aorta: Secondary | ICD-10-CM | POA: Insufficient documentation

## 2016-10-17 NOTE — Assessment & Plan Note (Signed)
No recurrence. 

## 2016-10-17 NOTE — Assessment & Plan Note (Addendum)
Sees ophtho yearly, on azopt drops.

## 2016-10-17 NOTE — Assessment & Plan Note (Signed)
Stable period.  

## 2016-10-17 NOTE — Assessment & Plan Note (Signed)
repaired 2015

## 2016-11-05 ENCOUNTER — Encounter: Payer: Self-pay | Admitting: Gastroenterology

## 2016-12-06 ENCOUNTER — Other Ambulatory Visit: Payer: Self-pay | Admitting: Cardiology

## 2016-12-27 ENCOUNTER — Encounter: Payer: Self-pay | Admitting: Gastroenterology

## 2016-12-28 DIAGNOSIS — H5210 Myopia, unspecified eye: Secondary | ICD-10-CM | POA: Diagnosis not present

## 2017-02-16 ENCOUNTER — Encounter: Payer: Self-pay | Admitting: Gastroenterology

## 2017-02-18 ENCOUNTER — Ambulatory Visit (INDEPENDENT_AMBULATORY_CARE_PROVIDER_SITE_OTHER): Payer: Medicare HMO | Admitting: Gastroenterology

## 2017-02-18 ENCOUNTER — Encounter: Payer: Self-pay | Admitting: Gastroenterology

## 2017-02-18 VITALS — BP 152/92 | HR 76 | Ht 71.26 in | Wt 214.2 lb

## 2017-02-18 DIAGNOSIS — K645 Perianal venous thrombosis: Secondary | ICD-10-CM | POA: Diagnosis not present

## 2017-02-18 DIAGNOSIS — Z86010 Personal history of colonic polyps: Secondary | ICD-10-CM

## 2017-02-18 MED ORDER — NA SULFATE-K SULFATE-MG SULF 17.5-3.13-1.6 GM/177ML PO SOLN: 1.0000 | Freq: Once | ORAL | 0 refills | Status: AC

## 2017-02-18 NOTE — Patient Instructions (Addendum)
You have been scheduled for a colonoscopy. Please follow written instructions given to you at your visit today.  Please pick up your prep supplies at the pharmacy within the next 1-3 days. [facility].  If you use inhalers (even only as needed), please bring them with you on the day of your procedure. Your physician has requested that you go to www.startemmi.com and enter the access code given to you at your visit today. This web site gives a general overview about your procedure. However, you should still follow specific instructions given to you by our office regarding your preparation for the procedure.

## 2017-02-18 NOTE — Progress Notes (Signed)
02/18/2017 CLAUDELL WOHLER [id number] [birth year]   HISTORY OF PRESENT ILLNESS:  This is a pleasant 71 year old male who is known to Dr. [name]. He has personal history of colon polyps and is actually scheduled for surveillance colonoscopy with Dr. [name] in June. He was supposed to come in for a previsit to go over his prep instructions next week.  Anyway, he states that he was told previously that he had "very early or the beginnings of hemorrhoids" when he had his physical by his PCP last year. He says that 2 days ago he suddenly developed what he thinks is a very large hemorrhoid. He complains of no bleeding and no pain associated with this. He just wanted to come have it checked since he was not sure if it would interfere with colonoscopy.   Past Medical History:  Diagnosis Date  . Allergic rhinitis, cause unspecified   . Aortic insufficiency    s/p AVR 2015  . Aortic root enlargement (HCC)    thoracic aorta  . Aortic valve stenosis 2015   s/p AVR 2015 [name]) - completed cardiac rehab 08/2014  . Arthritis   . Asthma   . Atrial fibrillation ([place]) 07/23/2014  . Benign neoplasm of colon   . Gallstones    by CT  . History of kidney stones   . History of nephrolithiasis   . HLD (hyperlipidemia)   . Hypertension   . Need for prophylactic antibiotic   . Obstructive sleep apnea (adult) (pediatric)    pt. doesn't use his machine at home.  . Other testicular hypofunction   . Pulmonary nodule, left 2015   2cm, ?sarcoid by biopsy  . Unspecified glaucoma(365.9)    Past Surgical History:  Procedure Laterality Date  . AORTIC VALVE REPLACEMENT N/A 07/02/2014   Procedure: AORTIC VALVE REPLACEMENT (AVR) with 23 Aortic Magna Ease;  Surgeon: Grace Isaac, MD  . ASCENDING AORTIC ROOT REPLACEMENT N/A 07/02/2014   Procedure: supra coronary  ASCENDING AORTIC REPLACEMENT to the Inominate Artery with 32mm Hemashield Platinum with  circulatory arrest;  Surgeon: Grace Isaac, MD  .  [place]    . CARDIAC VALVE REPLACEMENT    . CATARACT EXTRACTION Bilateral   . COLONOSCOPY  2013   WNL, rpt 5 yrs [name])  . CORONARY ARTERY BYPASS GRAFT    . EYE SURGERY    . INTRAOPERATIVE TRANSESOPHAGEAL ECHOCARDIOGRAM N/A 07/02/2014  . JOINT REPLACEMENT Right 14   partial replacement  . KNEE SURGERY Right 05/2012   arthroscopic  . KNEE SURGERY Right 12/2013   patella  . LEFT AND RIGHT HEART CATHETERIZATION WITH CORONARY ANGIOGRAM N/A 05/22/2014   no significant obstructive CAD ([name])  . LITHOTRIPSY    . LUNG BIOPSY Left 2015   by IR - benign  . MEDIAL PARTIAL KNEE REPLACEMENT Right 03/2013  . RHINOPLASTY  1980s  . VENTRAL HERNIA REPAIR  08/26/2015  . VENTRAL HERNIA REPAIR N/A 08/26/2015   Procedure: VENTRAL HERNIA REPAIR WITH MESH ADULT;  Surgeon: Grace Isaac, MD;  Location: [facility];  Service: Vascular;  Laterality: N/A;    reports that he has never smoked. He has never used smokeless tobacco. He reports that he does not drink alcohol or use drugs. family history includes CAD (age of onset: 61) in his father; Cancer in his mother; Heart disease in his brother, brother, and brother. Allergies  Allergen Reactions  . Ace Inhibitors Cough    Hacking cough      Outpatient Encounter Prescriptions  as of 02/18/2017  Medication Sig  . aspirin 81 MG tablet Take 81 mg by mouth daily.  [name] atorvastatin (LIPITOR) 10 MG tablet Take 1 tablet (10 mg total) by mouth daily at 6 PM.  . brinzolamide (AZOPT) 1 % ophthalmic suspension Place 1 drop into both eyes 2 (two) times daily.  . cholecalciferol (VITAMIN D) 1000 UNITS tablet Take 1,000 Units by mouth daily.  . metoprolol tartrate (LOPRESSOR) 25 MG tablet TAKE 1 TABLET TWICE DAILY  . vitamin C (ASCORBIC ACID) 500 MG tablet Take 500 mg by mouth daily.   [name] albuterol (VENTOLIN HFA) 108 (90 BASE) MCG/ACT inhaler Inhale 2 puffs into the lungs every 6 (six) hours as needed for wheezing or shortness of breath. (Patient not taking:  Reported on 02/18/2017)   No facility-administered encounter medications on file as of 02/18/2017.      REVIEW OF SYSTEMS  : All other systems reviewed and negative except where noted in the History of Present Illness.   PHYSICAL EXAM: BP (!) 152/92 (BP Location: Left Arm, Patient Position: Sitting, Cuff Size: Normal)   Pulse 76   Ht 5' 11.26" (1.81 m) Comment: height measured without shoes  Wt 214 lb 4 oz (97.2 kg)   BMI 29.66 kg/m  General: Well developed white male in no acute distress Head: Normocephalic and atraumatic Eyes:  Sclerae anicteric, conjunctiva pink. Ears: Normal auditory acuity Neck: Supple, no masses.  Lungs: Clear throughout to auscultation; no increased WOB. Heart: Regular rate and rhythm Abdomen: Soft, non-distended. Normal bowel sounds.  Non-tender. Rectal:  Thrombosed external hemorrhoid noted.  Mildly tender on exam. Musculoskeletal: Symmetrical with no gross deformities  Skin: No lesions on visible extremities Extremities: No edema  Neurological: Alert oriented x 4, grossly non-focal Psychological:  Alert and cooperative. Normal mood and affect  ASSESSMENT AND PLAN: -Thrombosed external hemorrhoid:  Appeared 2 days ago.  Not extremely painful.  Will leave it alone for now. -Personal history of colon polyps:  Scheduled for colonoscopy with Dr. [name] in June.  Will go over instructions today.  *The risks, benefits, and alternatives to colonoscopy were discussed with the patient and he consents to proceed.   CC:  Ria Bush, MD

## 2017-02-18 NOTE — Progress Notes (Signed)
Reviewed and agree with management plan.  Bernadetta Roell T. Lashawna Poche, MD FACG 

## 2017-02-22 ENCOUNTER — Encounter: Payer: Self-pay | Admitting: Gastroenterology

## 2017-02-24 DIAGNOSIS — H401131 Primary open-angle glaucoma, bilateral, mild stage: Secondary | ICD-10-CM | POA: Diagnosis not present

## 2017-03-01 ENCOUNTER — Encounter: Payer: Self-pay | Admitting: Family Medicine

## 2017-03-01 ENCOUNTER — Other Ambulatory Visit: Payer: Self-pay | Admitting: Cardiology

## 2017-03-01 DIAGNOSIS — D126 Benign neoplasm of colon, unspecified: Secondary | ICD-10-CM

## 2017-03-01 NOTE — Telephone Encounter (Signed)
See pt email. New referral placed (looks like 5/25 one was canceled?)

## 2017-03-02 ENCOUNTER — Telehealth: Payer: Self-pay | Admitting: Gastroenterology

## 2017-03-02 NOTE — Telephone Encounter (Signed)
I spoke with [name] and apologized for the mix up.  His procedure was inadvertently canceled by pre-visit.  He is aware I will contact him tomorrow when I can make some adjustments to the schedule.

## 2017-03-03 NOTE — Telephone Encounter (Signed)
Patient has been rescheduled to 03/08/17 10:00.  I reviewed with his wife the adjustments to the prep instructions.  I apologized again for the inconvenience.

## 2017-03-08 ENCOUNTER — Encounter: Payer: Self-pay | Admitting: Gastroenterology

## 2017-03-08 ENCOUNTER — Ambulatory Visit (AMBULATORY_SURGERY_CENTER): Payer: Medicare HMO | Admitting: Gastroenterology

## 2017-03-08 ENCOUNTER — Encounter: Payer: Commercial Managed Care - HMO | Admitting: Gastroenterology

## 2017-03-08 DIAGNOSIS — D124 Benign neoplasm of descending colon: Secondary | ICD-10-CM | POA: Diagnosis not present

## 2017-03-08 DIAGNOSIS — Z86010 Personal history of colonic polyps: Secondary | ICD-10-CM | POA: Diagnosis present

## 2017-03-08 DIAGNOSIS — Z1211 Encounter for screening for malignant neoplasm of colon: Secondary | ICD-10-CM | POA: Diagnosis not present

## 2017-03-08 MED ORDER — SODIUM CHLORIDE 0.9 % IV SOLN: 500.0000 mL | INTRAVENOUS | Status: AC

## 2017-03-08 NOTE — Progress Notes (Signed)
A and O x3. Report to RN. Tolerated MAC anesthesia well.

## 2017-03-08 NOTE — Op Note (Signed)
[facility] Patient Name: Jeremy Meadows Procedure Date: 03/08/2017 9:50 AM MRN: [id number] Endoscopist: [name] , MD Age: 71 Referring MD:  Date of Birth: 07/22/1946 Gender: Male Account #: [id number] Procedure:                Colonoscopy Indications:              Surveillance: Personal history of adenomatous                            polyps on last colonoscopy 5 years ago Medicines:                Monitored Anesthesia Care Procedure:                Pre-Anesthesia Assessment:                           - Prior to the procedure, a History and Physical                            was performed, and patient medications and                            allergies were reviewed. The patient's tolerance of                            previous anesthesia was also reviewed. The risks                            and benefits of the procedure and the sedation                            options and risks were discussed with the patient.                            All questions were answered, and informed consent                            was obtained. Prior Anticoagulants: The patient has                            taken no previous anticoagulant or antiplatelet                            agents. ASA Grade Assessment: II - A patient with                            mild systemic disease. After reviewing the risks                            and benefits, the patient was deemed in                            satisfactory condition to undergo the procedure.  After obtaining informed consent, the colonoscope                            was passed under direct vision. Throughout the                            procedure, the patient's blood pressure, pulse, and                            oxygen saturations were monitored continuously. The                            Colonoscope was introduced through the anus and                            advanced to the the cecum,  identified by                            appendiceal orifice and ileocecal valve. The                            ileocecal valve, appendiceal orifice, and rectum                            were photographed. The quality of the bowel                            preparation was excellent. The colonoscopy was                            performed without difficulty. The patient tolerated                            the procedure well. Scope In: 9:59:04 AM Scope Out: 10:11:02 AM Scope Withdrawal Time: 0 hours 9 minutes 32 seconds  Total Procedure Duration: 0 hours 11 minutes 58 seconds  Findings:                 The perianal and digital rectal examinations were                            normal.                           A 7 mm polyp was found in the descending colon. The                            polyp was sessile. The polyp was removed with a                            cold snare. Resection and retrieval were complete.                           Scattered medium-mouthed diverticula were found in  the left colon. There was no evidence of                            diverticular bleeding.                           The exam was otherwise without abnormality on                            direct and retroflexion views. Complications:            No immediate complications. Estimated blood loss:                            None. Estimated Blood Loss:     Estimated blood loss: none. Impression:               - One 7 mm polyp in the descending colon, removed                            with a cold snare. Resected and retrieved.                           - Mild diverticulosis in the left colon. There was                            no evidence of diverticular bleeding.                           - The examination was otherwise normal on direct                            and retroflexion views. Recommendation:           - Repeat colonoscopy in 5 years for surveillance.                            - Patient has a contact number available for                            emergencies. The signs and symptoms of potential                            delayed complications were discussed with the                            patient. Return to normal activities tomorrow.                            Written discharge instructions were provided to the                            patient.                           - Resume previous diet.                           -  Continue present medications.                           - Await pathology results. [name], MD 03/08/2017 10:13:29 AM This report has been signed electronically.

## 2017-03-08 NOTE — Progress Notes (Signed)
Called to room to assist during endoscopic procedure.  Patient ID and intended procedure confirmed with present staff. Received instructions for my participation in the procedure from the performing physician.  

## 2017-03-08 NOTE — Progress Notes (Signed)
Pt's states no medical or surgical changes since previsit or office visit. 

## 2017-03-08 NOTE — Patient Instructions (Signed)
YOU HAD AN ENDOSCOPIC PROCEDURE TODAY AT THE  ENDOSCOPY CENTER:   Refer to the procedure report that was given to you for any specific questions about what was found during the examination.  If the procedure report does not answer your questions, please call your gastroenterologist to clarify.  If you requested that your care partner not be given the details of your procedure findings, then the procedure report has been included in a sealed envelope for you to review at your convenience later.  YOU SHOULD EXPECT: Some feelings of bloating in the abdomen. Passage of more gas than usual.  Walking can help get rid of the air that was put into your GI tract during the procedure and reduce the bloating. If you had a lower endoscopy (such as a colonoscopy or flexible sigmoidoscopy) you may notice spotting of blood in your stool or on the toilet paper. If you underwent a bowel prep for your procedure, you may not have a normal bowel movement for a few days.  Please Note:  You might notice some irritation and congestion in your nose or some drainage.  This is from the oxygen used during your procedure.  There is no need for concern and it should clear up in a day or so.  SYMPTOMS TO REPORT IMMEDIATELY:   Following lower endoscopy (colonoscopy or flexible sigmoidoscopy):  Excessive amounts of blood in the stool  Significant tenderness or worsening of abdominal pains  Swelling of the abdomen that is new, acute  Fever of 100F or higher   For urgent or emergent issues, a gastroenterologist can be reached at any hour by calling [phone].   DIET:  We do recommend a small meal at first, but then you may proceed to your regular diet.  Drink plenty of fluids but you should avoid alcoholic beverages for 24 hours. Try to increase the fiber in your diet, and drink plenty of water.  ACTIVITY:  You should plan to take it easy for the rest of today and you should NOT DRIVE or use heavy machinery until  tomorrow (because of the sedation medicines used during the test).    FOLLOW UP: Our staff will call the number listed on your records the next business day following your procedure to check on you and address any questions or concerns that you may have regarding the information given to you following your procedure. If we do not reach you, we will leave a message.  However, if you are feeling well and you are not experiencing any problems, there is no need to return our call.  We will assume that you have returned to your regular daily activities without incident.  If any biopsies were taken you will be contacted by phone or by letter within the next 1-3 weeks.  Please call us at [phone] if you have not heard about the biopsies in 3 weeks.    SIGNATURES/CONFIDENTIALITY: You and/or your care partner have signed paperwork which will be entered into your electronic medical record.  These signatures attest to the fact that that the information above on your After Visit Summary has been reviewed and is understood.  Full responsibility of the confidentiality of this discharge information lies with you and/or your care-partner.  Read all of the handouts given to you by your recovery room nurse. 

## 2017-03-09 ENCOUNTER — Telehealth: Payer: Self-pay | Admitting: *Deleted

## 2017-03-09 NOTE — Telephone Encounter (Signed)
  Follow up Call-  Call back number 03/08/2017  Post procedure Call Back phone  # [phone]  Permission to leave phone message Yes  Some recent data might be hidden     Patient questions:  Do you have a fever, pain , or abdominal swelling? No. Pain Score  0 *  Have you tolerated food without any problems? Yes.    Have you been able to return to your normal activities? Yes.    Do you have any questions about your discharge instructions: Diet   No. Medications  No. Follow up visit  No.  Do you have questions or concerns about your Care? No.  Actions: * If pain score is 4 or above: No action needed, pain <4.

## 2017-03-13 ENCOUNTER — Encounter: Payer: Self-pay | Admitting: Family Medicine

## 2017-03-15 ENCOUNTER — Encounter: Payer: Self-pay | Admitting: Gastroenterology

## 2017-03-16 ENCOUNTER — Encounter: Payer: Self-pay | Admitting: Family Medicine

## 2017-03-30 DIAGNOSIS — R1032 Left lower quadrant pain: Secondary | ICD-10-CM | POA: Diagnosis not present

## 2017-03-30 DIAGNOSIS — R109 Unspecified abdominal pain: Secondary | ICD-10-CM | POA: Diagnosis not present

## 2017-03-30 DIAGNOSIS — Z7982 Long term (current) use of aspirin: Secondary | ICD-10-CM | POA: Diagnosis not present

## 2017-03-30 DIAGNOSIS — I10 Essential (primary) hypertension: Secondary | ICD-10-CM | POA: Insufficient documentation

## 2017-03-30 DIAGNOSIS — J45909 Unspecified asthma, uncomplicated: Secondary | ICD-10-CM | POA: Diagnosis not present

## 2017-03-30 DIAGNOSIS — Z79899 Other long term (current) drug therapy: Secondary | ICD-10-CM | POA: Insufficient documentation

## 2017-03-30 DIAGNOSIS — N132 Hydronephrosis with renal and ureteral calculous obstruction: Secondary | ICD-10-CM | POA: Diagnosis not present

## 2017-03-30 DIAGNOSIS — N200 Calculus of kidney: Secondary | ICD-10-CM | POA: Diagnosis not present

## 2017-03-31 ENCOUNTER — Emergency Department: Payer: Medicare HMO

## 2017-03-31 ENCOUNTER — Emergency Department
Admission: EM | Admit: 2017-03-31 | Discharge: 2017-03-31 | Disposition: A | Discharge status: Home / Self Care | Payer: Medicare HMO | Attending: Emergency Medicine | Admitting: Emergency Medicine

## 2017-03-31 DIAGNOSIS — N200 Calculus of kidney: Secondary | ICD-10-CM

## 2017-03-31 DIAGNOSIS — R109 Unspecified abdominal pain: Secondary | ICD-10-CM

## 2017-03-31 LAB — CBC WITH DIFFERENTIAL/PLATELET
Basophils Absolute: 0 10*3/uL (ref 0–0.1)
Basophils Relative: 0 %
Eosinophils Absolute: 0.6 10*3/uL (ref 0–0.7)
Eosinophils Relative: 6 %
HEMATOCRIT: 46.7 % (ref 40.0–52.0)
HEMOGLOBIN: 16.2 g/dL (ref 13.0–18.0)
LYMPHS ABS: 3.2 10*3/uL (ref 1.0–3.6)
LYMPHS PCT: 34 %
MCH: 32.7 pg (ref 26.0–34.0)
MCHC: 34.8 g/dL (ref 32.0–36.0)
MCV: 94 fL (ref 80.0–100.0)
MONOS PCT: 12 %
Monocytes Absolute: 1.2 10*3/uL — ABNORMAL HIGH (ref 0.2–1.0)
NEUTROS PCT: 48 %
Neutro Abs: 4.5 10*3/uL (ref 1.4–6.5)
Platelets: 173 10*3/uL (ref 150–440)
RBC: 4.96 MIL/uL (ref 4.40–5.90)
RDW: 13.1 % (ref 11.5–14.5)
WBC: 9.5 10*3/uL (ref 3.8–10.6)

## 2017-03-31 LAB — BASIC METABOLIC PANEL
Anion gap: 8 (ref 5–15)
BUN: 25 mg/dL — AB (ref 6–20)
CHLORIDE: 103 mmol/L (ref 101–111)
CO2: 28 mmol/L (ref 22–32)
CREATININE: 1.8 mg/dL — AB (ref 0.61–1.24)
Calcium: 9.5 mg/dL (ref 8.9–10.3)
GFR calc Af Amer: 42 mL/min — ABNORMAL LOW (ref >60)
GFR calc non Af Amer: 36 mL/min — ABNORMAL LOW (ref >60)
GLUCOSE: 128 mg/dL — AB (ref 65–99)
Potassium: 4.3 mmol/L (ref 3.5–5.1)
SODIUM: 139 mmol/L (ref 135–145)

## 2017-03-31 LAB — URINALYSIS, COMPLETE (UACMP) WITH MICROSCOPIC
BACTERIA UA: NONE SEEN
BILIRUBIN URINE: NEGATIVE
Glucose, UA: NEGATIVE mg/dL
KETONES UR: 5 mg/dL — AB
LEUKOCYTES UA: NEGATIVE
Nitrite: NEGATIVE
Protein, ur: NEGATIVE mg/dL
SQUAMOUS EPITHELIAL / LPF: NONE SEEN
Specific Gravity, Urine: 1.027 (ref 1.005–1.030)
pH: 5 (ref 5.0–8.0)

## 2017-03-31 MED ORDER — SODIUM CHLORIDE 0.9 % IV BOLUS (SEPSIS)
1000.0000 mL | Freq: Once | INTRAVENOUS | Status: AC
  Administered 2017-03-31: 1000 mL via INTRAVENOUS

## 2017-03-31 MED ORDER — ONDANSETRON HCL 4 MG/2ML IJ SOLN
4.0000 mg | Freq: Once | INTRAMUSCULAR | Status: AC
  Administered 2017-03-31: 4 mg via INTRAVENOUS
  Filled 2017-03-31: qty 2

## 2017-03-31 MED ORDER — OXYCODONE-ACETAMINOPHEN 5-325 MG PO TABS
1.0000 | ORAL_TABLET | Freq: Once | ORAL | Status: AC
  Administered 2017-03-31: 1 via ORAL
  Filled 2017-03-31: qty 1

## 2017-03-31 MED ORDER — OXYCODONE-ACETAMINOPHEN 5-325 MG PO TABS: 1.0000 | ORAL_TABLET | ORAL | 0 refills | Status: DC | PRN

## 2017-03-31 MED ORDER — KETOROLAC TROMETHAMINE 30 MG/ML IJ SOLN
30.0000 mg | Freq: Once | INTRAMUSCULAR | Status: AC
  Administered 2017-03-31: 30 mg via INTRAVENOUS
  Filled 2017-03-31: qty 1

## 2017-03-31 MED ORDER — ONDANSETRON 4 MG PO TBDP
4.0000 mg | ORAL_TABLET | Freq: Once | ORAL | Status: AC
  Administered 2017-03-31: 4 mg via ORAL
  Filled 2017-03-31: qty 1

## 2017-03-31 MED ORDER — HYDROMORPHONE HCL 1 MG/ML IJ SOLN: 0.5000 mg | Freq: Once | INTRAMUSCULAR | Status: DC

## 2017-03-31 MED ORDER — ONDANSETRON 4 MG PO TBDP: 4.0000 mg | ORAL_TABLET | Freq: Three times a day (TID) | ORAL | 0 refills | Status: DC | PRN

## 2017-03-31 MED ORDER — TAMSULOSIN HCL 0.4 MG PO CAPS: 0.4000 mg | ORAL_CAPSULE | Freq: Every day | ORAL | 0 refills | Status: DC

## 2017-03-31 MED ORDER — ONDANSETRON HCL 4 MG/2ML IJ SOLN: 4.0000 mg | Freq: Once | INTRAMUSCULAR | Status: DC

## 2017-03-31 NOTE — ED Provider Notes (Signed)
[facility] Emergency Department Provider Note   ____________________________________________   First MD Initiated Contact with Patient 03/31/17 [phone]     (approximate)  I have reviewed the triage vital signs and the nursing notes.   HISTORY  Chief Complaint Flank Pain    HPI Jeremy Meadows is a 71 y.o. male who presents to the ED from home with a chief complaint of left flank pain. Patient has a history of kidney stones, last in 2015, who reports sudden onset left-sided flank pain approximately 11 PM. Describes sharp pain radiating to his left lower abdomen. Symptoms not associated with nausea, vomiting or hematuria. Patient denies recent fever, chills, chest pain, shortness of breath. Denies recent travel or trauma. Nothing makes his symptoms better or worse.   Past Medical History:  Diagnosis Date  . Allergic rhinitis, cause unspecified   . Aortic insufficiency    s/p AVR 2015  . Aortic root enlargement (HCC)    thoracic aorta  . Aortic valve stenosis 2015   s/p AVR 2015 [name]) - completed cardiac rehab 08/2014  . Arthritis   . Asthma   . Atrial fibrillation ([place]) 07/23/2014  . Benign neoplasm of colon   . Gallstones    by CT  . History of kidney stones   . History of nephrolithiasis   . HLD (hyperlipidemia)   . Hypertension   . Need for prophylactic antibiotic   . Obstructive sleep apnea (adult) (pediatric)    pt. doesn't use his machine at home.  . Other testicular hypofunction   . Pulmonary nodule, left 2015   2cm, ?sarcoid by biopsy  . Unspecified glaucoma(365.9)     Patient Active Problem List   Diagnosis Date Noted  . Special screening for malignant neoplasms, colon 03/08/2017  . History of colonic polyps 02/18/2017  . Thrombosed external hemorrhoid 02/18/2017  . CAD (coronary artery disease) 10/17/2016  . Thoracic aortic atherosclerosis ([place]) 10/17/2016  . Health maintenance examination 08/12/2015  . Advanced care  planning/counseling discussion 08/05/2014  . Medicare annual wellness visit, subsequent 08/05/2014  . Prediabetes 08/05/2014  . 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 S/P AVR (aortic valve replacement) and aortoplasty 07/23/2014  . Thoracic ascending aortic aneurysm ([place]) 07/02/2014  . Sarcoidosis 04/26/2014  . Asthmatic bronchitis 04/24/2014  . Pulmonary nodule, left   . Gross hematuria 11/06/2013  . Essential hypertension 08/29/2013  . Aortic insufficiency   . Aortic valve stenosis, severe   . DJD (degenerative joint disease) 02/27/2013  . Knee pain 02/27/2013  . Wedge compression fracture of thoracic vertebra ([place]) 02/27/2013  . TESTICULAR HYPOFUNCTION 09/08/2010  . NEPHROLITHIASIS 08/23/2009  . 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 Glaucoma 08/22/2008  . COLONIC POLYPS 12/18/2007  . HYPERCHOLESTEROLEMIA 12/18/2007  . Obstructive sleep apnea 12/18/2007  . ALLERGIC RHINITIS 12/18/2007    Past Surgical History:  Procedure Laterality Date  . AORTIC VALVE REPLACEMENT N/A 07/02/2014   Procedure: AORTIC VALVE REPLACEMENT (AVR) with 23 Aortic Magna Ease;  Surgeon: Grace Isaac, MD  . ASCENDING AORTIC ROOT REPLACEMENT N/A 07/02/2014   Procedure: supra coronary  ASCENDING AORTIC REPLACEMENT to the Inominate Artery with 19mm Hemashield Platinum with  circulatory arrest;  Surgeon: Grace Isaac, MD  . [place]    . CARDIAC VALVE REPLACEMENT    . CATARACT EXTRACTION Bilateral   . COLONOSCOPY  2013   WNL, rpt 5 yrs [name])  . COLONOSCOPY  2018   1 TA, diverticulosis, rpt 5 yrs [name])  . CORONARY ARTERY BYPASS GRAFT    .  EYE SURGERY    . INTRAOPERATIVE TRANSESOPHAGEAL ECHOCARDIOGRAM N/A 07/02/2014  . JOINT REPLACEMENT Right 14   partial replacement  . KNEE SURGERY Right 05/2012   arthroscopic  . KNEE SURGERY Right 12/2013   patella  . LEFT AND RIGHT HEART CATHETERIZATION WITH CORONARY ANGIOGRAM N/A 05/22/2014   no significant obstructive CAD ([name])  . LITHOTRIPSY      . LUNG BIOPSY Left 2015   by IR - benign  . MEDIAL PARTIAL KNEE REPLACEMENT Right 03/2013  . RHINOPLASTY  1980s  . VENTRAL HERNIA REPAIR  08/26/2015  . VENTRAL HERNIA REPAIR N/A 08/26/2015   Procedure: VENTRAL HERNIA REPAIR WITH MESH ADULT;  Surgeon: Grace Isaac, MD;  Location: [place];  Service: Vascular;  Laterality: N/A;    Prior to Admission medications   Medication Sig Start Date End Date Taking? Authorizing Provider  albuterol (VENTOLIN HFA) 108 (90 BASE) MCG/ACT inhaler Inhale 2 puffs into the lungs every 6 (six) hours as needed for wheezing or shortness of breath. Patient not taking: Reported on 02/18/2017 03/21/14   Noralee Space, MD  aspirin 81 MG tablet Take 81 mg by mouth daily.    [provider]  atorvastatin (LIPITOR) 10 MG tablet TAKE 1 TABLET EVERY DAY  AT  6PM 03/01/17   Kerin Ransom K, PA-C  brinzolamide (AZOPT) 1 % ophthalmic suspension Place 1 drop into both eyes 2 (two) times daily.    [provider]  cholecalciferol (VITAMIN D) 1000 UNITS tablet Take 1,000 Units by mouth daily.    [provider]  metoprolol tartrate (LOPRESSOR) 25 MG tablet TAKE 1 TABLET TWICE DAILY 12/06/16   [name], Peter M, MD  ondansetron (ZOFRAN ODT) 4 MG disintegrating tablet Take 1 tablet (4 mg total) by mouth every 8 (eight) hours as needed for nausea or vomiting. 03/31/17   Paulette Blanch, MD  oxyCODONE-acetaminophen (ROXICET) 5-325 MG tablet Take 1 tablet by mouth every 4 (four) hours as needed for severe pain. 03/31/17   Paulette Blanch, MD  tamsulosin (FLOMAX) 0.4 MG CAPS capsule Take 1 capsule (0.4 mg total) by mouth daily. 03/31/17   Paulette Blanch, MD  vitamin C (ASCORBIC ACID) 500 MG tablet Take 500 mg by mouth daily.     [provider]    Allergies Ace inhibitors  Family History  Problem Relation Age of Onset  . Cancer Mother        Jaw  . CAD Father 51       MI  . Heart disease Brother   . Heart disease Brother   . Heart disease Brother   . Colon  cancer Neg Hx   . Colon polyps Neg Hx   . Esophageal cancer Neg Hx   . Rectal cancer Neg Hx   . Stomach cancer Neg Hx     Social History Social History  Substance Use Topics  . Smoking status: Never Smoker  . Smokeless tobacco: Never Used  . Alcohol use No    Review of Systems  Constitutional: No fever/chills. Eyes: No visual changes. ENT: No sore throat. Cardiovascular: Denies chest pain. Respiratory: Denies shortness of breath. Gastrointestinal: Positive for left flank pain. No abdominal pain.  No nausea, no vomiting.  No diarrhea.  No constipation. Genitourinary: Negative for dysuria. Musculoskeletal: Negative for back pain. Skin: Negative for rash. Neurological: Negative for headaches, focal weakness or numbness.   ____________________________________________   PHYSICAL EXAM:  VITAL SIGNS: ED Triage Vitals  Enc Vitals Group  BP 03/31/17 0005 (!) 151/81     Pulse Rate 03/31/17 0005 65     Resp --      Temp 03/31/17 0005 98.3 F (36.8 C)     Temp Source 03/31/17 0005 Axillary     SpO2 03/31/17 0005 99 %     Weight 03/31/17 0001 215 lb (97.5 kg)     Height 03/31/17 0001 6' (1.829 m)     Head Circumference --      Peak Flow --      Pain Score 03/31/17 0000 9     Pain Loc --      Pain Edu? --      Excl. in [place]? --     Constitutional: Alert and oriented. Well appearing and in no acute distress. Eyes: Conjunctivae are normal. PERRL. EOMI. Head: Atraumatic. Nose: No congestion/rhinnorhea. Mouth/Throat: Mucous membranes are moist.  Oropharynx non-erythematous. Neck: No stridor.   Cardiovascular: Normal rate, regular rhythm. Grossly normal heart sounds.  Good peripheral circulation. Respiratory: Normal respiratory effort.  No retractions. Lungs CTAB. Gastrointestinal: Soft and nontender. No distention. No abdominal bruits. No CVA tenderness. Musculoskeletal: No lower extremity tenderness nor edema.  No joint effusions. Neurologic:  Normal speech and  language. No gross focal neurologic deficits are appreciated. No gait instability. Skin:  Skin is warm, dry and intact. No rash noted. Psychiatric: Mood and affect are normal. Speech and behavior are normal.  ____________________________________________   LABS (all labs ordered are listed, but only abnormal results are displayed)  Labs Reviewed  URINALYSIS, COMPLETE (UACMP) WITH MICROSCOPIC - Abnormal; Notable for the following:       Result Value   Color, Urine YELLOW (*)    APPearance CLEAR (*)    Hgb urine dipstick SMALL (*)    Ketones, ur 5 (*)    All other components within normal limits  CBC WITH DIFFERENTIAL/PLATELET - Abnormal; Notable for the following:    Monocytes Absolute 1.2 (*)    All other components within normal limits  BASIC METABOLIC PANEL - Abnormal; Notable for the following:    Glucose, Bld 128 (*)    BUN 25 (*)    Creatinine, Ser 1.80 (*)    GFR calc non Af Amer 36 (*)    GFR calc Af Amer 42 (*)    All other components within normal limits   ____________________________________________  EKG  None ____________________________________________  RADIOLOGY  Ct Renal Stone Study  Result Date: 03/31/2017 CLINICAL DATA:  Acute onset of left flank pain.  Initial encounter. EXAM: CT ABDOMEN AND PELVIS WITHOUT CONTRAST TECHNIQUE: Multidetector CT imaging of the abdomen and pelvis was performed following the standard protocol without IV contrast. COMPARISON:  PET/CT performed 04/09/2014, and renal ultrasound performed 02/12/2014 FINDINGS: Lower chest: The visualized lung bases are grossly clear. An aortic valve replacement is noted. The patient is status post median sternotomy. Hepatobiliary: The liver is unremarkable in appearance. Stones are noted within a contracted gallbladder. The gallbladder is otherwise unremarkable. The common bile duct remains normal in caliber. Pancreas: The pancreas is within normal limits. Spleen: The spleen is unremarkable in appearance.  Adrenals/Urinary Tract: The adrenal glands are unremarkable in appearance. There is minimal left-sided hydronephrosis, with an obstructing 5 x 4 mm stone noted at the proximal left ureter, just below the left renal pelvis. Bilateral perinephric stranding is noted. A nonobstructing 2 mm stone is noted at the interpole region of the left kidney. The right kidney is otherwise unremarkable. Stomach/Bowel: The stomach is unremarkable in appearance.  The small bowel is within normal limits. The appendix is normal in caliber, without evidence of appendicitis. The colon is unremarkable in appearance. Vascular/Lymphatic: Scattered calcification is seen along the abdominal aorta and its branches. The abdominal aorta is otherwise grossly unremarkable. The inferior vena cava is grossly unremarkable. No retroperitoneal lymphadenopathy is seen. No pelvic sidewall lymphadenopathy is identified. There is minimal calcification of tiny mesenteric nodes, likely reflecting remote granulomatous disease. Reproductive: The bladder is decompressed and not well assessed. The prostate remains normal in size. Other: No additional soft tissue abnormalities are seen. Musculoskeletal: No acute osseous abnormalities are identified. The visualized musculature is unremarkable in appearance. IMPRESSION: 1. Minimal left-sided hydronephrosis, with an obstructing 5 x 4 mm stone at the proximal left ureter, just below the left renal pelvis. 2. Nonobstructing 2 mm stone at the interpole region of the left kidney. 3. Scattered aortic atherosclerosis. 4. Cholelithiasis.  Gallbladder otherwise unremarkable. Electronically Signed   By: Garald Balding M.D.   On: 03/31/2017 01:42    ____________________________________________   PROCEDURES  Procedure(s) performed: None  Procedures  Critical Care performed: No  ____________________________________________   INITIAL IMPRESSION / ASSESSMENT AND PLAN / ED COURSE  Pertinent labs & imaging results  that were available during my care of the patient were reviewed by me and considered in my medical decision making (see chart for details).  71 year old male who presents with left flank pain. History of kidney stones. Reports Toradol usually works for him. IV Toradol was administered without relief; Dilaudid ordered. Blood work, urinalysis and IV fluids pending.  Clinical Course as of Mar 31 309  Thu Mar 31, 2017  0135 Pain down to 2/10. Dilaudid held.  [JS]  0200 Discussed with urologist on call Dr. Junious Silk who agrees patient may be discharged to follow-up with his urologist as his pain is well-controlled. Updated patient and spouse and given CD of his CT scan to bring to his urologist. Will place him on Flomax, Percocet and Zofran as needed for pain and nausea. Strict return precautions given. Both verbalize understanding and agree with plan of care.  [JS]    Clinical Course User Index [JS] Paulette Blanch, MD     ____________________________________________   FINAL CLINICAL IMPRESSION(S) / ED DIAGNOSES  Final diagnoses:  Left flank pain  Kidney stone      NEW MEDICATIONS STARTED DURING THIS VISIT:  New Prescriptions   ONDANSETRON (ZOFRAN ODT) 4 MG DISINTEGRATING TABLET    Take 1 tablet (4 mg total) by mouth every 8 (eight) hours as needed for nausea or vomiting.   OXYCODONE-ACETAMINOPHEN (ROXICET) 5-325 MG TABLET    Take 1 tablet by mouth every 4 (four) hours as needed for severe pain.   TAMSULOSIN (FLOMAX) 0.4 MG CAPS CAPSULE    Take 1 capsule (0.4 mg total) by mouth daily.     Note:  This document was prepared using Dragon voice recognition software and may include unintentional dictation errors.    Paulette Blanch, MD 03/31/17 [phone]

## 2017-03-31 NOTE — Discharge Instructions (Signed)
1. Take pain & nausea medicines as needed (Percocet/Zofran #30). Make sure to take a stool softener while taking narcotic pain medicines. 2. Take Flomax 0.4mg daily x 14 days. 3. Drink plenty of bottled or filtered water daily. 4. Return to the ER for worsening symptoms, persistent vomiting, fever, difficulty breathing or other concerns.  

## 2017-03-31 NOTE — ED Triage Notes (Signed)
Pt in with co acute onset of left sided flank pain that started 45 min prior to arrival. Hx of kidney stones states feels the same, pt states toradol works for him.

## 2017-04-12 DIAGNOSIS — N202 Calculus of kidney with calculus of ureter: Secondary | ICD-10-CM | POA: Diagnosis not present

## 2017-04-13 ENCOUNTER — Encounter: Payer: Medicare HMO | Admitting: Gastroenterology

## 2017-04-24 ENCOUNTER — Encounter: Payer: Self-pay | Admitting: Family Medicine

## 2017-05-09 DIAGNOSIS — N200 Calculus of kidney: Secondary | ICD-10-CM | POA: Diagnosis not present

## 2017-05-09 DIAGNOSIS — R109 Unspecified abdominal pain: Secondary | ICD-10-CM | POA: Diagnosis not present

## 2017-05-09 DIAGNOSIS — Z952 Presence of prosthetic heart valve: Secondary | ICD-10-CM | POA: Diagnosis not present

## 2017-05-09 DIAGNOSIS — N2889 Other specified disorders of kidney and ureter: Secondary | ICD-10-CM | POA: Diagnosis not present

## 2017-05-14 NOTE — Progress Notes (Signed)
I will  Jeremy Meadows Date of Birth: 1946-03-13 Medical Record #[id number]  History of Present Illness: [name] is seen for followup of aortic stenosis and  thoracic aortic aneurysm. He is s/p AVR and aortic aneurysm grafting by Dr. [name] on 07/02/14 with a #23 Magna Ease pericardial valve and 32 mm Hema shield graft. His post op course was complicated by atrial fibrillation that converted to NSR on amiodarone. Amiodarone was later discontinued and he has had no further arrhythmia.   In Nov 2016 he had an incisional hernia repaired by Dr [name], he tolerated this well. CT of the aorta in December 2017 was stable.   He was seen in the ED in July 2018 with flank pain due to renal stones. Sent home with follow up with urology. [place] Does note abdomen has increased in size since his hernia surgery. Still very active playing travel softball. He brings extensive BP readings that have been borderline.   Current Outpatient Prescriptions on File Prior to Visit  Medication Sig Dispense Refill  . albuterol (VENTOLIN HFA) 108 (90 BASE) MCG/ACT inhaler Inhale 2 puffs into the lungs every 6 (six) hours as needed for wheezing or shortness of breath. 1 Inhaler 0  . aspirin 81 MG tablet Take 81 mg by mouth daily.    [name] atorvastatin (LIPITOR) 10 MG tablet TAKE 1 TABLET EVERY DAY  AT  6PM 90 tablet 3  . brinzolamide (AZOPT) 1 % ophthalmic suspension Place 1 drop into both eyes 2 (two) times daily.    . cholecalciferol (VITAMIN D) 1000 UNITS tablet Take 1,000 Units by mouth daily.    . ondansetron (ZOFRAN ODT) 4 MG disintegrating tablet Take 1 tablet (4 mg total) by mouth every 8 (eight) hours as needed for nausea or vomiting. 30 tablet 0  . oxyCODONE-acetaminophen (ROXICET) 5-325 MG tablet Take 1 tablet by mouth every 4 (four) hours as needed for severe pain. 30 tablet 0  . tamsulosin (FLOMAX) 0.4 MG CAPS capsule Take 1 capsule (0.4 mg  total) by mouth daily. 14 capsule 0  . vitamin C (ASCORBIC ACID) 500 MG tablet Take 500 mg by mouth daily.      Current Facility-Administered Medications on File Prior to Visit  Medication Dose Route Frequency Provider Last Rate Last Dose  . 0.9 %  sodium chloride infusion  500 mL Intravenous Continuous [name], MD        Allergies  Allergen Reactions  . Ace Inhibitors Cough    Hacking cough    Past Medical History:  Diagnosis Date  . Allergic rhinitis, cause unspecified   . Aortic insufficiency    s/p AVR 2015  . Aortic root enlargement (HCC)    thoracic aorta  . Aortic valve stenosis 2015   s/p AVR 2015 [name]) - completed cardiac rehab 08/2014  . Arthritis   . Asthma   . Atrial fibrillation ([place]) 07/23/2014  . Benign neoplasm of colon   . Gallstones    by CT  . History of kidney stones   . History of nephrolithiasis   . HLD (hyperlipidemia)   . Hypertension   . Need for prophylactic antibiotic   . Obstructive sleep apnea (adult) (pediatric)    pt. doesn't use his machine at home.  . Other testicular hypofunction   . Pulmonary nodule, left 2015   2cm, ?sarcoid by biopsy  . Unspecified glaucoma(365.9)     Past Surgical History:  Procedure  Laterality Date  . AORTIC VALVE REPLACEMENT N/A 07/02/2014   Procedure: AORTIC VALVE REPLACEMENT (AVR) with 23 Aortic Magna Ease;  Surgeon: Grace Isaac, MD  . ASCENDING AORTIC ROOT REPLACEMENT N/A 07/02/2014   Procedure: supra coronary  ASCENDING AORTIC REPLACEMENT to the Inominate Artery with 61mm Hemashield Platinum with  circulatory arrest;  Surgeon: Grace Isaac, MD  . [place]    . CARDIAC VALVE REPLACEMENT    . CATARACT EXTRACTION Bilateral   . COLONOSCOPY  2013   WNL, rpt 5 yrs [name])  . COLONOSCOPY  2018   1 TA, diverticulosis, rpt 5 yrs [name])  . CORONARY ARTERY BYPASS GRAFT    . EYE SURGERY    . INTRAOPERATIVE TRANSESOPHAGEAL ECHOCARDIOGRAM N/A 07/02/2014  . JOINT REPLACEMENT  Right 14   partial replacement  . KNEE SURGERY Right 05/2012   arthroscopic  . KNEE SURGERY Right 12/2013   patella  . LEFT AND RIGHT HEART CATHETERIZATION WITH CORONARY ANGIOGRAM N/A 05/22/2014   no significant obstructive CAD ([name])  . LITHOTRIPSY    . LUNG BIOPSY Left 2015   by IR - benign  . MEDIAL PARTIAL KNEE REPLACEMENT Right 03/2013  . RHINOPLASTY  1980s  . VENTRAL HERNIA REPAIR  08/26/2015  . VENTRAL HERNIA REPAIR N/A 08/26/2015   Procedure: VENTRAL HERNIA REPAIR WITH MESH ADULT;  Surgeon: Grace Isaac, MD;  Location: [place];  Service: Vascular;  Laterality: N/A;    History  Smoking Status  . Never Smoker  Smokeless Tobacco  . Never Used    History  Alcohol Use No    Family History  Problem Relation Age of Onset  . Cancer Mother        Jaw  . CAD Father 8       MI  . Heart disease Brother   . Heart disease Brother   . Heart disease Brother   . Colon cancer Neg Hx   . Colon polyps Neg Hx   . Esophageal cancer Neg Hx   . Rectal cancer Neg Hx   . Stomach cancer Neg Hx     Review of Systems: As noted in history of present illness. All other systems were reviewed and are negative.  Physical Exam: BP 130/88   Pulse (!) 101   Ht 5' 11.75" (1.822 m)   Wt 217 lb 6.4 oz (98.6 kg)   BMI 29.69 kg/m  He is a pleasant white male in no acute distress. HEENT: Normal Neck is without JVD, adenopathy, thyromegaly, or bruits. Carotid upstrokes are normal. Lungs: Clear Chest: median sternotomy and right subclavicular scars have healed well. He does have an incisional hernia at the lower sternal incision. Cardiovascular: Regular rate and rhythm. Normal S1 and S2. There is a grade 1/6 systolic murmur at the left sternal border. PMI is normal. No gallop. Abdomen: Soft and nontender. No masses or bruits. No hepatosplenomegaly. Bowel sounds are positive. Extremities: No cyanosis or edema. Pulses are 2+ and symmetric. Neuro: Alert and oriented x3. Cranial nerves II  through XII are intact.  LABORATORY DATA: Lab Results  Component Value Date   WBC 9.5 03/31/2017   HGB 16.2 03/31/2017   HCT 46.7 03/31/2017   PLT 173 03/31/2017   GLUCOSE 128 (H) 03/31/2017   CHOL 128 08/12/2016   TRIG 259.0 (H) 08/12/2016   HDL 31.80 (L) 08/12/2016   LDLDIRECT 69.0 08/12/2016   LDLCALC 61 08/05/2015   ALT 51 08/12/2016   AST 38 (H) 08/12/2016   NA 139 03/31/2017  K 4.3 03/31/2017   CL 103 03/31/2017   CREATININE 1.80 (H) 03/31/2017   BUN 25 (H) 03/31/2017   CO2 28 03/31/2017   TSH 1.36 08/12/2016   PSA 1.06 08/12/2016   INR 0.99 08/14/2015   HGBA1C 5.5 08/12/2016    Ecg today shows NSR with very frequent PVCs and couplets. Nonspecific ST- T wave abnormality. I have personally reviewed and interpreted this study.  Echo: 08/02/14: Study Conclusions  - Left ventricle: The cavity size was normal. Wall thickness was normal. Systolic function was normal. The estimated ejection fraction was in the range of 60% to 65%. Features are consistent with a pseudonormal left ventricular filling pattern, with concomitant abnormal relaxation and increased filling pressure (grade 2 diastolic dysfunction). - Aortic valve: AV valve prosthesis opens well Peak and mean gradients through the valve are 17 and 10 mm Hg respectively. - Mitral valve: There was mild regurgitation. - Left atrium: The atrium was moderately dilated.   CT CHEST WITHOUT CONTRAST  TECHNIQUE: Multidetector CT imaging of the chest was performed following the standard protocol without IV contrast.  COMPARISON:  02/20/2015  FINDINGS: Cardiovascular: No acute findings. Previous aortic valve replacement. Stable appearance of the ascending aortic graft. Distal ascending aorta along the distal aspect of the graft measures 4.5 cm in maximum diameter, which remains stable. No evidence of mediastinal hematoma.  Aortic and coronary artery atherosclerotic calcification noted. Normal  heart size. No evidence pericardial effusion.  Mediastinum/Nodes: No masses or pathologically enlarged lymph nodes identified on this unenhanced exam.  Lungs/Pleura: No pulmonary infiltrate or mass identified. Mild scarring noted posterior left lower lobe. No effusion present.  Upper Abdomen:  Small calcified gallstones incidentally noted.  Musculoskeletal:  No suspicious bone lesions.  IMPRESSION: Previous aortic valve replacement. Stable appearance of ascending aortic graft. Stable mild dilatation of distal ascending aorta along the distal aspect of the graft, measuring 4.5 cm in maximum diameter.  No acute findings.  Incidentally noted aortic and coronary atherosclerosis and cholelithiasis.   Electronically Signed   By: Earle Gell M.D.   On: 09/15/2016 14:38   Assessment / Plan: 1. Aortic stenosis -  S/p pericardial tissue valve replacement in October 2015. Doing well. Echo post op showed good valve function. Normal valve sound on exam.  2. Thoracic aortic aneurysm. S/p grafting. CT showed good repair with stable follow up in December 2017.   3. Hypertension-borderline control. Given history of aneurysm would like to see better control. Will increase metoprolol to 50 mg bid.    4. 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 Resolved.  5. Post op Atrial fibrillation. Resolved.   6. Renal stones. Per urology  7. Frequent PVCs and couplets. Asymptomatic. Electrolytes OK. Will increase metoprolol as noted above.  I will follow up in one year.

## 2017-05-16 ENCOUNTER — Ambulatory Visit (INDEPENDENT_AMBULATORY_CARE_PROVIDER_SITE_OTHER): Payer: Medicare HMO | Admitting: Cardiology

## 2017-05-16 ENCOUNTER — Encounter: Payer: Self-pay | Admitting: Cardiology

## 2017-05-16 VITALS — BP 130/88 | HR 101 | Ht 71.75 in | Wt 217.4 lb

## 2017-05-16 DIAGNOSIS — I712 Thoracic aortic aneurysm, without rupture: Secondary | ICD-10-CM

## 2017-05-16 DIAGNOSIS — I7121 Aneurysm of the ascending aorta, without rupture: Secondary | ICD-10-CM

## 2017-05-16 DIAGNOSIS — I10 Essential (primary) hypertension: Secondary | ICD-10-CM

## 2017-05-16 DIAGNOSIS — Z952 Presence of prosthetic heart valve: Secondary | ICD-10-CM | POA: Diagnosis not present

## 2017-05-16 DIAGNOSIS — E785 Hyperlipidemia, unspecified: Secondary | ICD-10-CM | POA: Diagnosis not present

## 2017-05-16 MED ORDER — METOPROLOL TARTRATE 50 MG PO TABS: 50.0000 mg | ORAL_TABLET | Freq: Two times a day (BID) | ORAL | 3 refills | Status: DC

## 2017-05-16 NOTE — Patient Instructions (Signed)
Increase metoprolol to 50 mg twice a day  Continue your other therapy  I will see you in one year.

## 2017-05-17 DIAGNOSIS — N202 Calculus of kidney with calculus of ureter: Secondary | ICD-10-CM | POA: Diagnosis not present

## 2017-05-19 ENCOUNTER — Encounter: Payer: Self-pay | Admitting: Family Medicine

## 2017-05-20 MED ORDER — ALBUTEROL SULFATE HFA 108 (90 BASE) MCG/ACT IN AERS: 2.0000 | INHALATION_SPRAY | Freq: Four times a day (QID) | RESPIRATORY_TRACT | 1 refills | Status: DC | PRN

## 2017-05-20 NOTE — Telephone Encounter (Signed)
Last Rx 2015 from pulmonology. Also, pt is requesting 2 inhalers and may have to pay out of pocket if insurance does not cover a 38mo supply. pls advise

## 2017-06-27 ENCOUNTER — Telehealth: Payer: Self-pay | Admitting: Family Medicine

## 2017-06-27 DIAGNOSIS — N200 Calculus of kidney: Secondary | ICD-10-CM

## 2017-06-27 DIAGNOSIS — R310 Gross hematuria: Secondary | ICD-10-CM

## 2017-06-27 NOTE — Telephone Encounter (Signed)
Noted  Referral placed.

## 2017-06-27 NOTE — Telephone Encounter (Signed)
Best number [phone]  Pt called wanting to change urology dr.  He goes [facility] and wants to go to [facility].  They require a referral before they will make him an appointment

## 2017-06-28 ENCOUNTER — Ambulatory Visit
Admission: RE | Admit: 2017-06-28 | Discharge: 2017-06-28 | Disposition: A | Discharge status: Home / Self Care | Payer: Medicare HMO | Source: Ambulatory Visit | Attending: Urology | Admitting: Urology

## 2017-06-28 ENCOUNTER — Ambulatory Visit (INDEPENDENT_AMBULATORY_CARE_PROVIDER_SITE_OTHER): Payer: Medicare HMO | Admitting: Urology

## 2017-06-28 ENCOUNTER — Encounter: Payer: Self-pay | Admitting: Urology

## 2017-06-28 VITALS — BP 145/93 | HR 67 | Ht 71.0 in | Wt 220.0 lb

## 2017-06-28 DIAGNOSIS — N200 Calculus of kidney: Secondary | ICD-10-CM

## 2017-06-28 DIAGNOSIS — Z09 Encounter for follow-up examination after completed treatment for conditions other than malignant neoplasm: Secondary | ICD-10-CM | POA: Diagnosis not present

## 2017-06-28 DIAGNOSIS — N201 Calculus of ureter: Secondary | ICD-10-CM | POA: Diagnosis not present

## 2017-06-28 DIAGNOSIS — Z87442 Personal history of urinary calculi: Secondary | ICD-10-CM | POA: Insufficient documentation

## 2017-06-28 LAB — URINALYSIS, COMPLETE
Bilirubin, UA: NEGATIVE
Glucose, UA: NEGATIVE
Ketones, UA: NEGATIVE
Leukocytes, UA: NEGATIVE
NITRITE UA: NEGATIVE
Protein, UA: NEGATIVE
RBC, UA: NEGATIVE
Specific Gravity, UA: 1.02 (ref 1.005–1.030)
UUROB: 0.2 mg/dL (ref 0.2–1.0)
pH, UA: 5.5 (ref 5.0–7.5)

## 2017-06-28 NOTE — Progress Notes (Signed)
06/28/2017 10:53 AM   Jeremy Meadows 05-Oct-1945 [id number]  Referring provider: Ria Bush, MD [address] [place], [place] [address]  Chief Complaint  Patient presents with  . Nephrolithiasis    New Patient    HPI: Consultation for renal and ureteral stone. Patient had left flank pain 03/31/2017. A CT scan revealed a 5 mm left proximal ureteral stone. There were some other punctate scattered stones. It was difficult to see the stone on the scout. I reviewed the images. His UA showed 6-30 red cells and a creatinine of 1.8. Due to recurrent left flank pain he followed up with Dr. Tresa Moore and a stone or stones were seen in left ureter on KUB Aug 2018 near L4. He developed pain again last night in the left flank. No groin pain or LUTS. He has never seen a stone pass. His UA is clear today.   He has Medical Stone Disease / Oliguria - Eval 2015 : Composition 100% CaOx; 24 Hr Urines - low volume. 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 He passed a stone before and required ESWL.   PMH sig for AVR (tissue valve, follows [facility] [name], not limiting), incisional hernia repair, orhtos surgery. He is avid Engineer, maintenance (IT) and quite fit. His PCP is Ria Bush MD.   I reviewed ED notes and Dr. Zettie Pho notes from [facility].   PMH: Past Medical History:  Diagnosis Date  . Allergic rhinitis, cause unspecified   . Aortic insufficiency    s/p AVR 2015  . Aortic root enlargement (HCC)    thoracic aorta  . Aortic valve stenosis 2015   s/p AVR 2015 [name]) - completed cardiac rehab 08/2014  . Arthritis   . Asthma   . Atrial fibrillation ([place]) 07/23/2014  . Benign neoplasm of colon   . Gallstones    by CT  . History of kidney stones   . History of nephrolithiasis   . HLD (hyperlipidemia)   . Hypertension   . Need for prophylactic antibiotic   . Obstructive sleep apnea (adult) (pediatric)    pt. doesn't use his machine at home.  . Other testicular  hypofunction   . Pulmonary nodule, left 2015   2cm, ?sarcoid by biopsy  . Unspecified glaucoma(365.9)     Surgical History: Past Surgical History:  Procedure Laterality Date  . AORTIC VALVE REPLACEMENT N/A 07/02/2014   Procedure: AORTIC VALVE REPLACEMENT (AVR) with 23 Aortic Magna Ease;  Surgeon: Grace Isaac, MD  . ASCENDING AORTIC ROOT REPLACEMENT N/A 07/02/2014   Procedure: supra coronary  ASCENDING AORTIC REPLACEMENT to the Inominate Artery with 67mm Hemashield Platinum with  circulatory arrest;  Surgeon: Grace Isaac, MD  . [place]    . CARDIAC VALVE REPLACEMENT    . CATARACT EXTRACTION Bilateral   . COLONOSCOPY  2013   WNL, rpt 5 yrs [name])  . COLONOSCOPY  2018   1 TA, diverticulosis, rpt 5 yrs [name])  . CORONARY ARTERY BYPASS GRAFT    . EYE SURGERY    . INTRAOPERATIVE TRANSESOPHAGEAL ECHOCARDIOGRAM N/A 07/02/2014  . JOINT REPLACEMENT Right 14   partial replacement  . KNEE SURGERY Right 05/2012   arthroscopic  . KNEE SURGERY Right 12/2013   patella  . LEFT AND RIGHT HEART CATHETERIZATION WITH CORONARY ANGIOGRAM N/A 05/22/2014   no significant obstructive CAD ([name])  . LITHOTRIPSY    . LUNG BIOPSY Left 2015   by IR - benign  . MEDIAL PARTIAL KNEE REPLACEMENT  Right 03/2013  . RHINOPLASTY  1980s  . VENTRAL HERNIA REPAIR  08/26/2015  . VENTRAL HERNIA REPAIR N/A 08/26/2015   Procedure: VENTRAL HERNIA REPAIR WITH MESH ADULT;  Surgeon: Grace Isaac, MD;  Location: [facility];  Service: Vascular;  Laterality: N/A;    Home Medications:  Allergies as of 06/28/2017      Reactions   Ace Inhibitors Cough   Hacking cough      Medication List       Accurate as of 06/28/17 10:53 AM. Always use your most recent med list.          albuterol 108 (90 Base) MCG/ACT inhaler Commonly known as:  VENTOLIN HFA Inhale 2 puffs into the lungs every 6 (six) hours as needed for wheezing or shortness of breath.   aspirin 81 MG tablet Take 81 mg by mouth  daily.   atorvastatin 10 MG tablet Commonly known as:  LIPITOR TAKE 1 TABLET EVERY DAY  AT  6PM   brinzolamide 1 % ophthalmic suspension Commonly known as:  AZOPT Place 1 drop into both eyes 2 (two) times daily.   cholecalciferol 1000 units tablet Commonly known as:  VITAMIN D Take 1,000 Units by mouth daily.   metoprolol tartrate 50 MG tablet Commonly known as:  LOPRESSOR Take 1 tablet (50 mg total) by mouth 2 (two) times daily.   ondansetron 4 MG disintegrating tablet Commonly known as:  ZOFRAN ODT Take 1 tablet (4 mg total) by mouth every 8 (eight) hours as needed for nausea or vomiting.   oxyCODONE-acetaminophen 5-325 MG tablet Commonly known as:  ROXICET Take 1 tablet by mouth every 4 (four) hours as needed for severe pain.   tamsulosin 0.4 MG Caps capsule Commonly known as:  FLOMAX Take 1 capsule (0.4 mg total) by mouth daily.   vitamin C 500 MG tablet Commonly known as:  ASCORBIC ACID Take 500 mg by mouth daily.       Allergies:  Allergies  Allergen Reactions  . Ace Inhibitors Cough    Hacking cough    Family History: Family History  Problem Relation Age of Onset  . Cancer Mother        Jaw  . CAD Father 35       MI  . Heart disease Brother   . Heart disease Brother   . Heart disease Brother   . Colon cancer Neg Hx   . Colon polyps Neg Hx   . Esophageal cancer Neg Hx   . Rectal cancer Neg Hx   . Stomach cancer Neg Hx     Social History:  reports that he has never smoked. He has never used smokeless tobacco. He reports that he does not drink alcohol or use drugs.  ROS: UROLOGY Frequent Urination?: No Hard to postpone urination?: No Burning/pain with urination?: No Get up at night to urinate?: No Leakage of urine?: No Urine stream starts and stops?: No Trouble starting stream?: No Do you have to strain to urinate?: No Blood in urine?: No Urinary tract infection?: No Sexually transmitted disease?: No Injury to kidneys or bladder?:  No Painful intercourse?: No Weak stream?: No Erection problems?: Yes Penile pain?: No  Gastrointestinal Nausea?: No Vomiting?: No Indigestion/heartburn?: No Diarrhea?: No Constipation?: No  Constitutional Fever: No Night sweats?: No Weight loss?: No Fatigue?: No  Skin Skin rash/lesions?: No Itching?: No  Eyes Blurred vision?: No Double vision?: No  Ears/Nose/Throat Sore throat?: No Sinus problems?: No  Hematologic/Lymphatic Swollen glands?: No Easy bruising?: No  Cardiovascular Leg swelling?: No Chest pain?: No  Respiratory Cough?: No Shortness of breath?: No  Endocrine Excessive thirst?: No  Musculoskeletal Back pain?: No Joint pain?: No  Neurological Headaches?: No Dizziness?: No  Psychologic Depression?: No Anxiety?: No  Physical Exam: BP (!) 145/93   Pulse 67   Ht 5\' 11"  (1.803 m)   Wt 99.8 kg (220 lb)   BMI 30.68 kg/m   Constitutional:  Alert and oriented, No acute distress. HEENT: [place] AT, moist mucus membranes.  Trachea midline, no masses. Cardiovascular: No clubbing, cyanosis, or edema. Respiratory: Normal respiratory effort, no increased work of breathing. GI: Abdomen is soft, nontender, nondistended, no abdominal masses GU: No CVA tenderness. Skin: No rashes, bruises or suspicious lesions. Lymph: No cervical or inguinal adenopathy. Neurologic: Grossly intact, no focal deficits, moving all 4 extremities. Psychiatric: Normal mood and affect.  Laboratory Data: Lab Results  Component Value Date   WBC 9.5 03/31/2017   HGB 16.2 03/31/2017   HCT 46.7 03/31/2017   MCV 94.0 03/31/2017   PLT 173 03/31/2017    Lab Results  Component Value Date   CREATININE 1.80 (H) 03/31/2017    No results found for: PSA1  Lab Results  Component Value Date   TESTOSTERONE 198.82 (L) 12/02/2011    Lab Results  Component Value Date   HGBA1C 5.5 08/12/2016    Urinalysis Lab Results  Component Value Date   APPEARANCEUR CLEAR (A)  03/31/2017   LEUKOCYTESUR NEGATIVE 03/31/2017   PROTEINUR NEGATIVE 03/31/2017   GLUCOSEU NEGATIVE 03/31/2017   RBCU 6-30 03/31/2017   BILIRUBINUR NEGATIVE 03/31/2017   NITRITE NEGATIVE 03/31/2017    Lab Results  Component Value Date   BACTERIA NONE SEEN 03/31/2017    Pertinent Imaging: CT and KUB   Results for orders placed during the hospital encounter of 01/31/14  DG Abd 1 View   Narrative CLINICAL DATA:  Renal stone.  Pre right lithotripsy.  EXAM: ABDOMEN - 1 VIEW  COMPARISON:  01/23/2014  FINDINGS: 11 mm stone projects in the ureteropelvic junction on the right. It is unchanged.  No other renal or ureteral stones. The soft tissues are otherwise unremarkable. Normal bowel gas pattern. Bony structures are unremarkable.  IMPRESSION: Stable 11 mm stone projecting in the region of the right ureteral pelvic junction.   Electronically Signed   By: Lajean Manes M.D.   On: 01/31/2014 14:31    No results found for this or any previous visit. No results found for this or any previous visit. No results found for this or any previous visit. No results found for this or any previous visit. No results found for this or any previous visit. No results found for this or any previous visit. Results for orders placed during the hospital encounter of 03/31/17  CT Renal Stone Study   Narrative CLINICAL DATA:  Acute onset of left flank pain.  Initial encounter.  EXAM: CT ABDOMEN AND PELVIS WITHOUT CONTRAST  TECHNIQUE: Multidetector CT imaging of the abdomen and pelvis was performed following the standard protocol without IV contrast.  COMPARISON:  PET/CT performed 04/09/2014, and renal ultrasound performed 02/12/2014  FINDINGS: Lower chest: The visualized lung bases are grossly clear. An aortic valve replacement is noted. The patient is status post median sternotomy.  Hepatobiliary: The liver is unremarkable in appearance. Stones are noted within a contracted  gallbladder. The gallbladder is otherwise unremarkable. The common bile duct remains normal in caliber.  Pancreas: The pancreas is within normal limits.  Spleen: The spleen is unremarkable  in appearance.  Adrenals/Urinary Tract: The adrenal glands are unremarkable in appearance.  There is minimal left-sided hydronephrosis, with an obstructing 5 x 4 mm stone noted at the proximal left ureter, just below the left renal pelvis. Bilateral perinephric stranding is noted. A nonobstructing 2 mm stone is noted at the interpole region of the left kidney. The right kidney is otherwise unremarkable.  Stomach/Bowel: The stomach is unremarkable in appearance. The small bowel is within normal limits. The appendix is normal in caliber, without evidence of appendicitis. The colon is unremarkable in appearance.  Vascular/Lymphatic: Scattered calcification is seen along the abdominal aorta and its branches. The abdominal aorta is otherwise grossly unremarkable. The inferior vena cava is grossly unremarkable. No retroperitoneal lymphadenopathy is seen. No pelvic sidewall lymphadenopathy is identified.  There is minimal calcification of tiny mesenteric nodes, likely reflecting remote granulomatous disease.  Reproductive: The bladder is decompressed and not well assessed. The prostate remains normal in size.  Other: No additional soft tissue abnormalities are seen.  Musculoskeletal: No acute osseous abnormalities are identified. The visualized musculature is unremarkable in appearance.  IMPRESSION: 1. Minimal left-sided hydronephrosis, with an obstructing 5 x 4 mm stone at the proximal left ureter, just below the left renal pelvis. 2. Nonobstructing 2 mm stone at the interpole region of the left kidney. 3. Scattered aortic atherosclerosis. 4. Cholelithiasis.  Gallbladder otherwise unremarkable.   Electronically Signed   By: Garald Balding M.D.   On: 03/31/2017 01:42     Assessment &  Plan:    1. Nephrolithiasis Check KUB and [place] if needed  - Urinalysis, Complete  2. Ureteral stone, left - check KUB and [place] as above. If stone remains, we discussed the nature r/b of surveillance, eswl or URS/HLL. He would want to proceed with ESWL. His UA is clear.    No Follow-up on file.  Festus Aloe, [facility] [address], [place], [place] [address] [phone]

## 2017-06-29 ENCOUNTER — Telehealth: Payer: Self-pay

## 2017-06-29 NOTE — Telephone Encounter (Signed)
Jeremy Aloe, MD  Lestine Box, LPN         Notify patient the plain x-ray was normal. No stones were seen. He has a renal ultrasound pending and we will see what that shows.    LMOM- xray negative. Will call back with RUS results.

## 2017-06-30 ENCOUNTER — Telehealth: Payer: Self-pay

## 2017-06-30 DIAGNOSIS — N200 Calculus of kidney: Secondary | ICD-10-CM

## 2017-06-30 NOTE — Telephone Encounter (Signed)
Spoke with pt wife in reference to pt needing a RUS. Wife stated that pt continues to have kidney stone pain and has been to the ER twice since July with this pain. Wife requested RUS to completed. Per Dr. Junious Silk dictation, RUS was ordered.

## 2017-07-01 ENCOUNTER — Ambulatory Visit (INDEPENDENT_AMBULATORY_CARE_PROVIDER_SITE_OTHER): Payer: Medicare HMO

## 2017-07-01 DIAGNOSIS — Z23 Encounter for immunization: Secondary | ICD-10-CM

## 2017-07-19 ENCOUNTER — Ambulatory Visit: Payer: Self-pay | Admitting: Urology

## 2017-07-22 ENCOUNTER — Ambulatory Visit
Admission: RE | Admit: 2017-07-22 | Discharge: 2017-07-22 | Disposition: A | Discharge status: Home / Self Care | Payer: Medicare HMO | Source: Ambulatory Visit | Attending: Urology | Admitting: Urology

## 2017-07-22 DIAGNOSIS — N200 Calculus of kidney: Secondary | ICD-10-CM | POA: Insufficient documentation

## 2017-07-25 ENCOUNTER — Telehealth: Payer: Self-pay

## 2017-07-25 DIAGNOSIS — N200 Calculus of kidney: Secondary | ICD-10-CM

## 2017-07-25 MED ORDER — TAMSULOSIN HCL 0.4 MG PO CAPS: 0.4000 mg | ORAL_CAPSULE | Freq: Every day | ORAL | 1 refills | Status: DC

## 2017-07-25 NOTE — Telephone Encounter (Signed)
Festus Aloe, MD  Lestine Box, LPN        Notify patient -- it appears he passed the stone. There is a small stone in the bladder and there is no longer any blockage noted of the left kidney.    Spoke with pt in reference to passing stones. Pt voiced understanding.

## 2017-08-19 ENCOUNTER — Encounter: Payer: Self-pay | Admitting: Family Medicine

## 2017-08-24 ENCOUNTER — Encounter: Payer: Self-pay | Admitting: Family Medicine

## 2017-08-25 MED ORDER — AZITHROMYCIN 250 MG PO TABS: ORAL_TABLET | ORAL | 0 refills | Status: DC

## 2017-12-11 ENCOUNTER — Other Ambulatory Visit: Payer: Self-pay | Admitting: Family Medicine

## 2017-12-11 DIAGNOSIS — R7303 Prediabetes: Secondary | ICD-10-CM

## 2017-12-11 DIAGNOSIS — D869 Sarcoidosis, unspecified: Secondary | ICD-10-CM

## 2017-12-11 DIAGNOSIS — E7800 Pure hypercholesterolemia, unspecified: Secondary | ICD-10-CM

## 2017-12-11 DIAGNOSIS — Z125 Encounter for screening for malignant neoplasm of prostate: Secondary | ICD-10-CM

## 2017-12-12 ENCOUNTER — Ambulatory Visit: Payer: Self-pay

## 2017-12-12 NOTE — Telephone Encounter (Signed)
Phone call from pt.  Reported onset of cough last week, approx. Monday or Tuesday.  Reported intermittent cough with a green yellow phlegm; reported it is severe at times.  Asking for an antibiotic to be called in.  Denied fever, nasal congestion, shortness of breath, or chest discomfort.  Has been using Nyquil HBP for the cough.  Advised he will need to be evaluated in the office, to determine treatment recommendations.  Appt. Given for 12/13/17 @ 10:30 AM.  Care advice given per protocol.  Pt. Verb. Understanding; agreed.     Reason for Disposition . [1] Continuous (nonstop) coughing interferes with work or school AND [2] no improvement using cough treatment per Care Advice  Answer Assessment - Initial Assessment Questions 1. ONSET: "When did the cough begin?"      Last week about Mon. or Tues.  2. SEVERITY: "How bad is the cough today?"      Severe at times; hacking cough; sometimes dry and sometimes with phlegm  3. RESPIRATORY DISTRESS: "Describe your breathing."      No shortness of breath 4. FEVER: "Do you have a fever?" If so, ask: "What is your temperature, how was it measured, and when did it start?"     No fever 5. SPUTUM: "Describe the color of your sputum" (clear, white, yellow, green)     Coughing up mucus; was choc. Brown, green, and now yellow  6. HEMOPTYSIS: "Are you coughing up any blood?" If so ask: "How much?" (flecks, streaks, tablespoons, etc.)     No streaks of blood noted 7. CARDIAC HISTORY: "Do you have any history of heart disease?" (e.g., heart attack, congestive heart failure)      Aortic Valve replacement 06/2014 8. LUNG HISTORY: "Do you have any history of lung disease?"  (e.g., pulmonary embolus, asthma, emphysema)    Exercise induced or triggered by strong scents ; denied PE  9. PE RISK FACTORS: "Do you have a history of blood clots?" (or: recent major surgery, recent prolonged travel, bedridden )     N/a 10. OTHER SYMPTOMS: "Do you have any other symptoms?"  (e.g., runny nose, wheezing, chest pain)       Denied wheezing or nasal drainage; denied chest discomfort.  11. PREGNANCY: "Is there any chance you are pregnant?" "When was your last menstrual period?"       N/a  12. TRAVEL: "Have you traveled out of the country in the last month?" (e.g., travel history, exposures)       No  Protocols used: [place]

## 2017-12-12 NOTE — Telephone Encounter (Signed)
Patient called, left VM on home and cell phones to call back to discuss his congestion symptoms.

## 2017-12-13 ENCOUNTER — Ambulatory Visit (INDEPENDENT_AMBULATORY_CARE_PROVIDER_SITE_OTHER): Payer: Medicare HMO | Admitting: Family Medicine

## 2017-12-13 ENCOUNTER — Ambulatory Visit: Payer: Medicare HMO

## 2017-12-13 ENCOUNTER — Encounter: Payer: Self-pay | Admitting: Family Medicine

## 2017-12-13 VITALS — BP 120/80 | HR 69 | Temp 97.8°F | Wt 216.0 lb

## 2017-12-13 DIAGNOSIS — J4521 Mild intermittent asthma with (acute) exacerbation: Secondary | ICD-10-CM | POA: Diagnosis not present

## 2017-12-13 DIAGNOSIS — D869 Sarcoidosis, unspecified: Secondary | ICD-10-CM | POA: Diagnosis not present

## 2017-12-13 MED ORDER — AZITHROMYCIN 250 MG PO TABS: ORAL_TABLET | ORAL | 0 refills | Status: DC

## 2017-12-13 MED ORDER — PREDNISONE 20 MG PO TABS: 40.0000 mg | ORAL_TABLET | Freq: Every day | ORAL | 0 refills | Status: DC

## 2017-12-13 NOTE — Progress Notes (Signed)
BP 120/80 (BP Location: Left Arm, Patient Position: Sitting, Cuff Size: Normal)   Pulse 69   Temp 97.8 F (36.6 C) (Oral)   Wt 216 lb (98 kg)   SpO2 98%   BMI 30.13 kg/m    CC: cough  Subjective:    Patient ID: Jeremy Meadows, male    DOB: 1945/11/17, 72 y.o.   MRN: [id number]  HPI: Jeremy Meadows is a 72 y.o. male presenting on 12/13/2017 for Cough (Productive cough with milky, yellow mucous for more than 1 wk. Tried HBP Nyquil.) and Sore Throat (Started about 1 wk ago. Throat is tender to touch. )   1 wk h/o productive cough of green and dark brown phlegm associated with tender left sided cervical lymph node. No sore throat  No fevers/chills, ear or tooth pain, dyspnea or wheezing, headache or significant nasal congestion. No ST, PNdrainage.    Out of state playing ball when symptoms started. Has tried nyquil which has helped.    No sick contacts at home.  Non smoker.  H/o AVR 2015.  H/o asthmatic bronchitis in the past.  H/o asthma on PRN albuterol inhaler.   Relevant past medical, surgical, family and social history reviewed and updated as indicated. Interim medical history since our last visit reviewed. Allergies and medications reviewed and updated. Outpatient Medications Prior to Visit  Medication Sig Dispense Refill  . albuterol (VENTOLIN HFA) 108 (90 Base) MCG/ACT inhaler Inhale 2 puffs into the lungs every 6 (six) hours as needed for wheezing or shortness of breath. 3 Inhaler 1  . aspirin 81 MG tablet Take 81 mg by mouth daily.    [name] atorvastatin (LIPITOR) 10 MG tablet TAKE 1 TABLET EVERY DAY  AT  6PM 90 tablet 3  . brinzolamide (AZOPT) 1 % ophthalmic suspension Place 1 drop into both eyes 2 (two) times daily.    . cholecalciferol (VITAMIN D) 1000 UNITS tablet Take 1,000 Units by mouth daily.    . metoprolol tartrate (LOPRESSOR) 50 MG tablet Take 1 tablet (50 mg total) by mouth 2 (two) times daily. 180 tablet 3  . vitamin C (ASCORBIC ACID) 500 MG tablet Take  500 mg by mouth daily.     [name] azithromycin (ZITHROMAX) 250 MG tablet Take two tablets on day one followed by one tablet on days 2-5 6 each 0  . ondansetron (ZOFRAN ODT) 4 MG disintegrating tablet Take 1 tablet (4 mg total) by mouth every 8 (eight) hours as needed for nausea or vomiting. (Patient not taking: Reported on 06/28/2017) 30 tablet 0  . oxyCODONE-acetaminophen (ROXICET) 5-325 MG tablet Take 1 tablet by mouth every 4 (four) hours as needed for severe pain. 30 tablet 0  . tamsulosin (FLOMAX) 0.4 MG CAPS capsule Take 1 capsule (0.4 mg total) by mouth daily. 30 capsule 1   Facility-Administered Medications Prior to Visit  Medication Dose Route Frequency Provider Last Rate Last Dose  . 0.9 %  sodium chloride infusion  500 mL Intravenous Continuous [name], MD         Per HPI unless specifically indicated in ROS section below Review of Systems     Objective:    BP 120/80 (BP Location: Left Arm, Patient Position: Sitting, Cuff Size: Normal)   Pulse 69   Temp 97.8 F (36.6 C) (Oral)   Wt 216 lb (98 kg)   SpO2 98%   BMI 30.13 kg/m   Wt Readings from Last 3 Encounters:  12/13/17 216 lb (98 kg)  06/28/17 220 lb (99.8 kg)  05/16/17 217 lb 6.4 oz (98.6 kg)    Physical Exam  Constitutional: He appears well-developed and well-nourished. No distress.  HENT:  Head: Normocephalic and atraumatic.  Right Ear: Hearing, tympanic membrane, external ear and ear canal normal.  Left Ear: Hearing, tympanic membrane, external ear and ear canal normal.  Nose: Nose normal. No mucosal edema or rhinorrhea. Right sinus exhibits no maxillary sinus tenderness and no frontal sinus tenderness. Left sinus exhibits no maxillary sinus tenderness and no frontal sinus tenderness.  Mouth/Throat: Uvula is midline, oropharynx is clear and moist and mucous membranes are normal. No oropharyngeal exudate, posterior oropharyngeal edema, posterior oropharyngeal erythema or tonsillar abscesses.  Eyes:  Conjunctivae and EOM are normal. Pupils are equal, round, and reactive to light. No scleral icterus.  Neck: Normal range of motion. Neck supple.  Cardiovascular: Normal rate, regular rhythm, normal heart sounds and intact distal pulses.  No murmur heard. Pulmonary/Chest: Effort normal and breath sounds normal. No respiratory distress. He has no wheezes. He has no rales.  Coarse at apices bilaterally Lungs clear at bases  Lymphadenopathy:    He has no cervical adenopathy.  Skin: Skin is warm and dry.  Peeling skin after recent sun exposure  Psychiatric: He has a normal mood and affect.  Nursing note and vitals reviewed.      Assessment & Plan:   Problem List Items Addressed This Visit    Asthmatic bronchitis - Primary    Anticipate mild flare. Possibly viral - no signs of bacterial infection. Rx prednisone burst, scheduled albuterol x 3 days, WASP for zpack with indications on when to fill.  Pt agrees with plan.       Relevant Medications   predniSONE (DELTASONE) 20 MG tablet   Sarcoidosis    Consider updating CXR at next wellness visit.          Meds ordered this encounter  Medications  . predniSONE (DELTASONE) 20 MG tablet    Sig: Take 2 tablets (40 mg total) by mouth daily with breakfast.    Dispense:  10 tablet    Refill:  0  . azithromycin (ZITHROMAX) 250 MG tablet    Sig: Take two tablets on day one followed by one tablet on days 2-5    Dispense:  6 each    Refill:  0   No orders of the defined types were placed in this encounter.   Follow up plan: Return if symptoms worsen or fail to improve.  Jeremy Bush, MD

## 2017-12-13 NOTE — Patient Instructions (Signed)
I think you do have acute bronchitis, likely viral process. Push fluids and rest. Short steroid course for 5 days.  Watch for fever, chills, worsening productive cough instead of improving, and if that happens then fill antibiotic provided today.  Continue other treatment as up to now.

## 2017-12-13 NOTE — Assessment & Plan Note (Signed)
Anticipate mild flare. Possibly viral - no signs of bacterial infection. Rx prednisone burst, scheduled albuterol x 3 days, WASP for zpack with indications on when to fill.  Pt agrees with plan.

## 2017-12-13 NOTE — Assessment & Plan Note (Signed)
Consider updating CXR at next wellness visit.

## 2017-12-15 ENCOUNTER — Encounter: Payer: Medicare HMO | Admitting: Family Medicine

## 2018-01-27 ENCOUNTER — Other Ambulatory Visit: Payer: Self-pay | Admitting: Cardiology

## 2018-01-30 NOTE — Telephone Encounter (Signed)
REFILL 

## 2018-02-03 DIAGNOSIS — H5210 Myopia, unspecified eye: Secondary | ICD-10-CM | POA: Diagnosis not present

## 2018-03-27 ENCOUNTER — Encounter: Payer: Self-pay | Admitting: Family Medicine

## 2018-03-27 ENCOUNTER — Ambulatory Visit (INDEPENDENT_AMBULATORY_CARE_PROVIDER_SITE_OTHER): Payer: Medicare HMO | Admitting: Family Medicine

## 2018-03-27 VITALS — BP 126/86 | HR 68 | Temp 97.8°F | Ht 71.0 in | Wt 215.5 lb

## 2018-03-27 DIAGNOSIS — J4521 Mild intermittent asthma with (acute) exacerbation: Secondary | ICD-10-CM | POA: Diagnosis not present

## 2018-03-27 MED ORDER — AZITHROMYCIN 250 MG PO TABS: ORAL_TABLET | ORAL | 0 refills | Status: DC

## 2018-03-27 MED ORDER — PREDNISONE 20 MG PO TABS: 40.0000 mg | ORAL_TABLET | Freq: Every day | ORAL | 0 refills | Status: AC

## 2018-03-27 NOTE — Patient Instructions (Signed)
Good to see you today  I have sent prednisone to your pharmacy  If not better in 2 days, can take antibiotic

## 2018-03-27 NOTE — Progress Notes (Signed)
Subjective:    Patient ID: Jeremy Meadows, male    DOB: 12/15/1945, 72 y.o.   MRN: [id number]  HPI This is a 72 yo male who presents today with cough x 1 week. Colored sputum, wheezing. No runny nose, no headache, no ear pain. Sputum green. No fever. Feels SOB with exercise only. Uses his albuterol inhaler before playing ball.  Has been taking Nyquil HBP with some improvement in sleep quality.  Has history of asthma and pneumonia.    Past Medical History:  Diagnosis Date  . Allergic rhinitis, cause unspecified   . Aortic insufficiency    s/p AVR 2015  . Aortic root enlargement (HCC)    thoracic aorta  . Aortic valve stenosis 2015   s/p AVR 2015 [name]) - completed cardiac rehab 08/2014  . Arthritis   . Asthma   . Atrial fibrillation ([place]) 07/23/2014  . Benign neoplasm of colon   . Gallstones    by CT  . History of kidney stones   . History of nephrolithiasis   . HLD (hyperlipidemia)   . Hypertension   . Need for prophylactic antibiotic   . Obstructive sleep apnea (adult) (pediatric)    pt. doesn't use his machine at home.  . Other testicular hypofunction   . Pulmonary nodule, left 2015   2cm, ?sarcoid by biopsy  . Unspecified glaucoma(365.9)    Past Surgical History:  Procedure Laterality Date  . AORTIC VALVE REPLACEMENT N/A 07/02/2014   Procedure: AORTIC VALVE REPLACEMENT (AVR) with 23 Aortic Magna Ease;  Surgeon: Grace Isaac, MD  . ASCENDING AORTIC ROOT REPLACEMENT N/A 07/02/2014   Procedure: supra coronary  ASCENDING AORTIC REPLACEMENT to the Inominate Artery with 28mm Hemashield Platinum with  circulatory arrest;  Surgeon: Grace Isaac, MD  . [place]    . CARDIAC VALVE REPLACEMENT    . CATARACT EXTRACTION Bilateral   . COLONOSCOPY  2013   WNL, rpt 5 yrs [name])  . COLONOSCOPY  2018   1 TA, diverticulosis, rpt 5 yrs [name])  . CORONARY ARTERY BYPASS GRAFT    . EYE SURGERY    . INTRAOPERATIVE TRANSESOPHAGEAL ECHOCARDIOGRAM N/A  07/02/2014  . JOINT REPLACEMENT Right 14   partial replacement  . KNEE SURGERY Right 05/2012   arthroscopic  . KNEE SURGERY Right 12/2013   patella  . LEFT AND RIGHT HEART CATHETERIZATION WITH CORONARY ANGIOGRAM N/A 05/22/2014   no significant obstructive CAD ([name])  . LITHOTRIPSY    . LUNG BIOPSY Left 2015   by IR - benign  . MEDIAL PARTIAL KNEE REPLACEMENT Right 03/2013  . RHINOPLASTY  1980s  . VENTRAL HERNIA REPAIR  08/26/2015  . VENTRAL HERNIA REPAIR N/A 08/26/2015   Procedure: VENTRAL HERNIA REPAIR WITH MESH ADULT;  Surgeon: Grace Isaac, MD;  Location: [facility] OR;  Service: Vascular;  Laterality: N/A;   Family History  Problem Relation Age of Onset  . Cancer Mother        Jaw  . CAD Father 54       MI  . Heart disease Brother   . Heart disease Brother   . Heart disease Brother   . Colon cancer Neg Hx   . Colon polyps Neg Hx   . Esophageal cancer Neg Hx   . Rectal cancer Neg Hx   . Stomach cancer Neg Hx    Social History   Tobacco Use  . Smoking status: Never Smoker  . Smokeless tobacco: Never Used  Substance Use Topics  .  Alcohol use: No    Alcohol/week: 0.0 oz  . Drug use: No      Review of Systems Per HPI    Objective:   Physical Exam  Constitutional: He is oriented to person, place, and time. He appears well-developed and well-nourished. No distress.  HENT:  Head: Normocephalic and atraumatic.  Right Ear: External ear normal.  Left Ear: External ear normal.  Nose: Nose normal.  Mouth/Throat: Oropharynx is clear and moist. No oropharyngeal exudate.  Eyes: Conjunctivae are normal.  Neck: Normal range of motion. Neck supple.  Cardiovascular: Normal rate and regular rhythm.  Murmur heard. Pulmonary/Chest: Effort normal.  Slightly decreased breath sounds posteriorly.   Musculoskeletal: He exhibits no edema.  Lymphadenopathy:    He has no cervical adenopathy.  Neurological: He is alert and oriented to person, place, and time.  Skin: Skin is warm and  dry. He is not diaphoretic.  Psychiatric: He has a normal mood and affect. His behavior is normal. Judgment and thought content normal.  Vitals reviewed.     BP 126/86 (BP Location: Left Arm, Patient Position: Sitting, Cuff Size: Normal)   Pulse 68   Temp 97.8 F (36.6 C) (Oral)   Ht 5\' 11"  (1.803 m)   Wt 215 lb 8 oz (97.8 kg)   SpO2 97%   BMI 30.06 kg/m  Wt Readings from Last 3 Encounters:  03/27/18 215 lb 8 oz (97.8 kg)  12/13/17 216 lb (98 kg)  06/28/17 220 lb (99.8 kg)       Assessment & Plan:  1. Mild intermittent asthmatic bronchitis with acute exacerbation - Provided written and verbal information regarding diagnosis and treatment. - Provided wait and see antibiotic to start if not better in 48 hours - Encouraged him to use his albuterol inhaler every 4-6 hours for next 2 days - RTC/ER precautions reviewed - predniSONE (DELTASONE) 20 MG tablet; Take 2 tablets (40 mg total) by mouth daily with breakfast for 5 doses.  Dispense: 10 tablet; Refill: 0 - azithromycin (ZITHROMAX) 250 MG tablet; Take 2 tabs PO x 1 dose, then 1 tab PO QD x 4 days  Dispense: 6 tablet; Refill: 0   Clarene Reamer, FNP-BC  [facility]  03/27/2018 3:38 PM

## 2018-05-26 NOTE — Progress Notes (Signed)
I will  Jeremy Meadows Date of Birth: 09/19/1946 Medical Record #[id number]  History of Present Illness: Jeremy Meadows is seen for followup of aortic stenosis and  thoracic aortic aneurysm. He is s/p AVR and aortic aneurysm grafting by Dr. [name] on 07/02/14 with a #23 Magna Ease pericardial valve and 32 mm Hema shield graft. His post op course was complicated by atrial fibrillation that converted to NSR on amiodarone. Amiodarone was later discontinued and he has had no further arrhythmia.   In Nov 2016 he had an incisional hernia repaired by Dr [name], he tolerated this well. CT of the aorta in December 2017 was stable.   On follow up today he is feeling very well. He denies any chest pain, SOB, palpitations. He is active playing softball several days a week. He brings extensive BP readings. His diastolic pressures are consistently elevated 85-100. Systolic readings look pretty good.   Current Outpatient Medications on File Prior to Visit  Medication Sig Dispense Refill  . albuterol (VENTOLIN HFA) 108 (90 Base) MCG/ACT inhaler Inhale 2 puffs into the lungs every 6 (six) hours as needed for wheezing or shortness of breath. 3 Inhaler 1  . aspirin 81 MG tablet Take 81 mg by mouth daily.    . brinzolamide (AZOPT) 1 % ophthalmic suspension Place 1 drop into both eyes 2 (two) times daily.    . cholecalciferol (VITAMIN D) 1000 UNITS tablet Take 1,000 Units by mouth daily.    . Melatonin 5 MG TABS at bedtime.    . vitamin C (ASCORBIC ACID) 500 MG tablet Take 500 mg by mouth daily.      Current Facility-Administered Medications on File Prior to Visit  Medication Dose Route Frequency Provider Last Rate Last Dose  . 0.9 %  sodium chloride infusion  500 mL Intravenous Continuous [name], MD        Allergies  Allergen Reactions  . Ace Inhibitors Cough    Hacking cough    Past Medical History:  Diagnosis Date  . Allergic rhinitis, cause unspecified   . Aortic insufficiency    s/p  AVR 2015  . Aortic root enlargement (HCC)    thoracic aorta  . Aortic valve stenosis 2015   s/p AVR 2015 [name]) - completed cardiac rehab 08/2014  . Arthritis   . Asthma   . Atrial fibrillation ([place]) 07/23/2014  . Benign neoplasm of colon   . Gallstones    by CT  . History of kidney stones   . History of nephrolithiasis   . HLD (hyperlipidemia)   . Hypertension   . Need for prophylactic antibiotic   . Obstructive sleep apnea (adult) (pediatric)    pt. doesn't use his machine at home.  . Other testicular hypofunction   . Pulmonary nodule, left 2015   2cm, ?sarcoid by biopsy  . Unspecified glaucoma(365.9)     Past Surgical History:  Procedure Laterality Date  . AORTIC VALVE REPLACEMENT N/A 07/02/2014   Procedure: AORTIC VALVE REPLACEMENT (AVR) with 23 Aortic Magna Ease;  Surgeon: Grace Isaac, MD  . ASCENDING AORTIC ROOT REPLACEMENT N/A 07/02/2014   Procedure: supra coronary  ASCENDING AORTIC REPLACEMENT to the Inominate Artery with 38mm Hemashield Platinum with  circulatory arrest;  Surgeon: Grace Isaac, MD  . [place]    . CARDIAC VALVE REPLACEMENT    . CATARACT EXTRACTION Bilateral   . COLONOSCOPY  2013   WNL, rpt 5 yrs [name])  . COLONOSCOPY  2018   1 TA,  diverticulosis, rpt 5 yrs [name])  . CORONARY ARTERY BYPASS GRAFT    . EYE SURGERY    . INTRAOPERATIVE TRANSESOPHAGEAL ECHOCARDIOGRAM N/A 07/02/2014  . JOINT REPLACEMENT Right 14   partial replacement  . KNEE SURGERY Right 05/2012   arthroscopic  . KNEE SURGERY Right 12/2013   patella  . LEFT AND RIGHT HEART CATHETERIZATION WITH CORONARY ANGIOGRAM N/A 05/22/2014   no significant obstructive CAD ([name])  . LITHOTRIPSY    . LUNG BIOPSY Left 2015   by IR - benign  . MEDIAL PARTIAL KNEE REPLACEMENT Right 03/2013  . RHINOPLASTY  1980s  . VENTRAL HERNIA REPAIR  08/26/2015  . VENTRAL HERNIA REPAIR N/A 08/26/2015   Procedure: VENTRAL HERNIA REPAIR WITH MESH ADULT;  Surgeon: Grace Isaac,  MD;  Location[facility] OR;  Service: Vascular;  Laterality: N/A;    Social History   Tobacco Use  Smoking Status Never Smoker  Smokeless Tobacco Never Used    Social History   Substance and Sexual Activity  Alcohol Use No  . Alcohol/week: 0.0 standard drinks    Family History  Problem Relation Age of Onset  . Cancer Mother        Jaw  . CAD Father 12       MI  . Heart disease Brother   . Heart disease Brother   . Heart disease Brother   . Colon cancer Neg Hx   . Colon polyps Neg Hx   . Esophageal cancer Neg Hx   . Rectal cancer Neg Hx   . Stomach cancer Neg Hx     Review of Systems: As noted in history of present illness. All other systems were reviewed and are negative.  Physical Exam: BP 136/84 (BP Location: Left Arm, Patient Position: Sitting, Cuff Size: Normal)   Ht 5\' 11"  (1.803 m)   Wt 211 lb (95.7 kg)   BMI 29.43 kg/m  GENERAL:  Well appearing WM in NAD HEENT:  PERRL, EOMI, sclera are clear. Oropharynx is clear. NECK:  No jugular venous distention, carotid upstroke brisk and symmetric, no bruits, no thyromegaly or adenopathy LUNGS:  Clear to auscultation bilaterally CHEST:  Unremarkable HEART:  RRR,  PMI not displaced or sustained,S1 and S2 within normal limits, no S3, no S4: no clicks, no rubs, no murmurs ABD:  Soft, nontender. BS +, no masses or bruits. No hepatomegaly, no splenomegaly EXT:  2 + pulses throughout, no edema, no cyanosis no clubbing SKIN:  Warm and dry.  No rashes NEURO:  Alert and oriented x 3. Cranial nerves II through XII intact. PSYCH:  Cognitively intact    LABORATORY DATA: Lab Results  Component Value Date   WBC 9.5 03/31/2017   HGB 16.2 03/31/2017   HCT 46.7 03/31/2017   PLT 173 03/31/2017   GLUCOSE 128 (H) 03/31/2017   CHOL 128 08/12/2016   TRIG 259.0 (H) 08/12/2016   HDL 31.80 (L) 08/12/2016   LDLDIRECT 69.0 08/12/2016   LDLCALC 61 08/05/2015   ALT 51 08/12/2016   AST 38 (H) 08/12/2016   NA 139 03/31/2017   K 4.3  03/31/2017   CL 103 03/31/2017   CREATININE 1.80 (H) 03/31/2017   BUN 25 (H) 03/31/2017   CO2 28 03/31/2017   TSH 1.36 08/12/2016   PSA 1.06 08/12/2016   INR 0.99 08/14/2015   HGBA1C 5.5 08/12/2016    Ecg today shows NSR rate 66. . Nonspecific ST wave abnormality- mild. I have personally reviewed and interpreted this study.  Echo: 08/02/14: Study  Conclusions  - Left ventricle: The cavity size was normal. Wall thickness was normal. Systolic function was normal. The estimated ejection fraction was in the range of 60% to 65%. Features are consistent with a pseudonormal left ventricular filling pattern, with concomitant abnormal relaxation and increased filling pressure (grade 2 diastolic dysfunction). - Aortic valve: AV valve prosthesis opens well Peak and mean gradients through the valve are 17 and 10 mm Hg respectively. - Mitral valve: There was mild regurgitation. - Left atrium: The atrium was moderately dilated.   CT CHEST WITHOUT CONTRAST  TECHNIQUE: Multidetector CT imaging of the chest was performed following the standard protocol without IV contrast.  COMPARISON:  02/20/2015  FINDINGS: Cardiovascular: No acute findings. Previous aortic valve replacement. Stable appearance of the ascending aortic graft. Distal ascending aorta along the distal aspect of the graft measures 4.5 cm in maximum diameter, which remains stable. No evidence of mediastinal hematoma.  Aortic and coronary artery atherosclerotic calcification noted. Normal heart size. No evidence pericardial effusion.  Mediastinum/Nodes: No masses or pathologically enlarged lymph nodes identified on this unenhanced exam.  Lungs/Pleura: No pulmonary infiltrate or mass identified. Mild scarring noted posterior left lower lobe. No effusion present.  Upper Abdomen:  Small calcified gallstones incidentally noted.  Musculoskeletal:  No suspicious bone lesions.  IMPRESSION: Previous aortic  valve replacement. Stable appearance of ascending aortic graft. Stable mild dilatation of distal ascending aorta along the distal aspect of the graft, measuring 4.5 cm in maximum diameter.  No acute findings.  Incidentally noted aortic and coronary atherosclerosis and cholelithiasis.   Electronically Signed   By: Earle Gell M.D.   On: 09/15/2016 14:38   Assessment / Plan: 1. Aortic stenosis/bicuspid AV -  S/p pericardial tissue valve replacement in October 2015. Doing well. Echo post op showed good valve function. Normal valve sound on exam. Will plan on repeating Echo next year - year 5 post op. SBE prophylaxis.   2. Thoracic aortic aneurysm. S/p grafting. CT showed good repair with stable follow up in December 2017.   3. Hypertension-suboptimal control. Given history of aneurysm would like to see better control. On metoprolol 50 mg bid. Will add amlodipine 2.5 mg daily. Given CKD will avoid diuretics and ACEi/ARB now.   4.Renal stones. Per urology  We will check complete lab work today.   I will follow up in one year with an Echo.

## 2018-05-30 ENCOUNTER — Encounter: Payer: Self-pay | Admitting: Cardiology

## 2018-05-30 ENCOUNTER — Ambulatory Visit: Payer: Medicare HMO | Admitting: Cardiology

## 2018-05-30 VITALS — BP 136/84 | Ht 71.0 in | Wt 211.0 lb

## 2018-05-30 DIAGNOSIS — I712 Thoracic aortic aneurysm, without rupture: Secondary | ICD-10-CM

## 2018-05-30 DIAGNOSIS — I7121 Aneurysm of the ascending aorta, without rupture: Secondary | ICD-10-CM

## 2018-05-30 DIAGNOSIS — I10 Essential (primary) hypertension: Secondary | ICD-10-CM

## 2018-05-30 DIAGNOSIS — E7800 Pure hypercholesterolemia, unspecified: Secondary | ICD-10-CM | POA: Diagnosis not present

## 2018-05-30 DIAGNOSIS — Z952 Presence of prosthetic heart valve: Secondary | ICD-10-CM | POA: Diagnosis not present

## 2018-05-30 DIAGNOSIS — I2510 Atherosclerotic heart disease of native coronary artery without angina pectoris: Secondary | ICD-10-CM | POA: Diagnosis not present

## 2018-05-30 MED ORDER — ATORVASTATIN CALCIUM 10 MG PO TABS: 10.0000 mg | ORAL_TABLET | Freq: Every day | ORAL | 1 refills | Status: DC

## 2018-05-30 MED ORDER — METOPROLOL TARTRATE 50 MG PO TABS: 50.0000 mg | ORAL_TABLET | Freq: Two times a day (BID) | ORAL | 3 refills | Status: DC

## 2018-05-30 MED ORDER — AMLODIPINE BESYLATE 2.5 MG PO TABS
2.5000 mg | ORAL_TABLET | Freq: Every day | ORAL | 3 refills | Status: DC
Start: 2018-05-30 — End: 2019-03-06

## 2018-05-30 NOTE — Addendum Note (Signed)
Addended by: Kathyrn Lass on: 05/30/2018 04:03 PM   Modules accepted: Orders

## 2018-05-30 NOTE — Patient Instructions (Signed)
Continue your current therapy  We will add amlodipine 2.5 mg daily for blood pressure  We will check lab work.

## 2018-05-31 LAB — LIPID PANEL W/O CHOL/HDL RATIO
CHOLESTEROL TOTAL: 103 mg/dL (ref 100–199)
HDL: 29 mg/dL — ABNORMAL LOW (ref >39)
LDL Calculated: 39 mg/dL (ref 0–99)
TRIGLYCERIDES: 173 mg/dL — AB (ref 0–149)
VLDL CHOLESTEROL CAL: 35 mg/dL (ref 5–40)

## 2018-05-31 LAB — COMPREHENSIVE METABOLIC PANEL
ALBUMIN: 4.5 g/dL (ref 3.5–4.8)
ALK PHOS: 80 IU/L (ref 39–117)
ALT: 21 IU/L (ref 0–44)
AST: 26 IU/L (ref 0–40)
Albumin/Globulin Ratio: 1.6 (ref 1.2–2.2)
BILIRUBIN TOTAL: 0.4 mg/dL (ref 0.0–1.2)
BUN/Creatinine Ratio: 18 (ref 10–24)
BUN: 24 mg/dL (ref 8–27)
CHLORIDE: 105 mmol/L (ref 96–106)
CO2: 24 mmol/L (ref 20–29)
Calcium: 9.7 mg/dL (ref 8.6–10.2)
Creatinine, Ser: 1.35 mg/dL — ABNORMAL HIGH (ref 0.76–1.27)
GFR calc non Af Amer: 52 mL/min/{1.73_m2} — ABNORMAL LOW (ref >59)
GFR, EST AFRICAN AMERICAN: 60 mL/min/{1.73_m2} (ref >59)
GLOBULIN, TOTAL: 2.8 g/dL (ref 1.5–4.5)
Glucose: 83 mg/dL (ref 65–99)
Potassium: 4.6 mmol/L (ref 3.5–5.2)
Sodium: 143 mmol/L (ref 134–144)
Total Protein: 7.3 g/dL (ref 6.0–8.5)

## 2018-05-31 LAB — CBC WITH DIFFERENTIAL/PLATELET
BASOS ABS: 0 10*3/uL (ref 0.0–0.2)
Basos: 1 %
EOS (ABSOLUTE): 0.2 10*3/uL (ref 0.0–0.4)
Eos: 3 %
HEMOGLOBIN: 16.1 g/dL (ref 13.0–17.7)
Hematocrit: 47.8 % (ref 37.5–51.0)
IMMATURE GRANS (ABS): 0.1 10*3/uL (ref 0.0–0.1)
Immature Granulocytes: 1 %
LYMPHS: 27 %
Lymphocytes Absolute: 2.1 10*3/uL (ref 0.7–3.1)
MCH: 31.4 pg (ref 26.6–33.0)
MCHC: 33.7 g/dL (ref 31.5–35.7)
MCV: 93 fL (ref 79–97)
MONOCYTES: 13 %
Monocytes Absolute: 1 10*3/uL — ABNORMAL HIGH (ref 0.1–0.9)
NEUTROS ABS: 4.4 10*3/uL (ref 1.4–7.0)
NEUTROS PCT: 55 %
Platelets: 171 10*3/uL (ref 150–450)
RBC: 5.12 x10E6/uL (ref 4.14–5.80)
RDW: 12.9 % (ref 12.3–15.4)
WBC: 7.8 10*3/uL (ref 3.4–10.8)

## 2018-05-31 LAB — TSH: TSH: 2.12 u[IU]/mL (ref 0.450–4.500)

## 2018-05-31 LAB — PSA: Prostate Specific Ag, Serum: 0.9 ng/mL (ref 0.0–4.0)

## 2018-06-19 ENCOUNTER — Other Ambulatory Visit: Payer: Self-pay | Admitting: Orthopedic Surgery

## 2018-06-19 DIAGNOSIS — M25511 Pain in right shoulder: Secondary | ICD-10-CM

## 2018-06-19 DIAGNOSIS — M19011 Primary osteoarthritis, right shoulder: Secondary | ICD-10-CM | POA: Diagnosis not present

## 2018-06-19 DIAGNOSIS — S46001A Unspecified injury of muscle(s) and tendon(s) of the rotator cuff of right shoulder, initial encounter: Secondary | ICD-10-CM | POA: Diagnosis not present

## 2018-06-19 DIAGNOSIS — M25311 Other instability, right shoulder: Secondary | ICD-10-CM | POA: Diagnosis not present

## 2018-06-22 ENCOUNTER — Ambulatory Visit (INDEPENDENT_AMBULATORY_CARE_PROVIDER_SITE_OTHER): Payer: Medicare HMO

## 2018-06-22 DIAGNOSIS — Z23 Encounter for immunization: Secondary | ICD-10-CM | POA: Diagnosis not present

## 2018-06-30 ENCOUNTER — Ambulatory Visit
Admission: RE | Admit: 2018-06-30 | Discharge: 2018-06-30 | Disposition: A | Discharge status: Home / Self Care | Payer: Medicare HMO | Source: Ambulatory Visit | Attending: Orthopedic Surgery | Admitting: Orthopedic Surgery

## 2018-06-30 DIAGNOSIS — M25511 Pain in right shoulder: Secondary | ICD-10-CM

## 2018-06-30 DIAGNOSIS — S46001A Unspecified injury of muscle(s) and tendon(s) of the rotator cuff of right shoulder, initial encounter: Secondary | ICD-10-CM

## 2018-06-30 DIAGNOSIS — M25311 Other instability, right shoulder: Secondary | ICD-10-CM

## 2018-06-30 DIAGNOSIS — M62511 Muscle wasting and atrophy, not elsewhere classified, right shoulder: Secondary | ICD-10-CM | POA: Insufficient documentation

## 2018-06-30 DIAGNOSIS — S46011A Strain of muscle(s) and tendon(s) of the rotator cuff of right shoulder, initial encounter: Secondary | ICD-10-CM | POA: Diagnosis not present

## 2018-06-30 DIAGNOSIS — M75121 Complete rotator cuff tear or rupture of right shoulder, not specified as traumatic: Secondary | ICD-10-CM | POA: Diagnosis not present

## 2018-06-30 DIAGNOSIS — M19011 Primary osteoarthritis, right shoulder: Secondary | ICD-10-CM | POA: Diagnosis not present

## 2018-07-04 DIAGNOSIS — S46011D Strain of muscle(s) and tendon(s) of the rotator cuff of right shoulder, subsequent encounter: Secondary | ICD-10-CM | POA: Diagnosis not present

## 2018-07-24 ENCOUNTER — Ambulatory Visit (INDEPENDENT_AMBULATORY_CARE_PROVIDER_SITE_OTHER): Payer: Medicare HMO

## 2018-07-24 VITALS — BP 140/90 | HR 68 | Temp 98.0°F | Ht 72.0 in | Wt 214.8 lb

## 2018-07-24 DIAGNOSIS — R7303 Prediabetes: Secondary | ICD-10-CM

## 2018-07-24 DIAGNOSIS — Z0000 Encounter for general adult medical examination without abnormal findings: Secondary | ICD-10-CM | POA: Diagnosis not present

## 2018-07-24 LAB — HEMOGLOBIN A1C: Hgb A1c MFr Bld: 5.6 % (ref 4.6–6.5)

## 2018-07-24 NOTE — Progress Notes (Signed)
Subjective:   Jeremy Meadows is a 72 y.o. male who presents for Medicare Annual/Subsequent preventive examination.  Review of Systems:  Cardiac Risk Factors include: advanced age (>46men, >28 women);male gender;dyslipidemia;hypertension     Objective:    Vitals: BP 140/90 (BP Location: Left Arm, Patient Position: Sitting, Cuff Size: Normal)   Pulse 68   Temp 98 F (36.7 C) (Oral)   Ht 6' (1.829 m) Comment: shoes  Wt 214 lb 12 oz (97.4 kg)   SpO2 95%   BMI 29.13 kg/m   Body mass index is 29.13 kg/m.  Advanced Directives 07/24/2018 03/08/2017 08/12/2016 08/26/2015 08/14/2015 07/02/2014 06/28/2014  Does Patient Have a Medical Advance Directive? Yes Yes Yes Yes Yes Yes Yes  Type of Paramedic of Jeremy Meadows;Living will Living will;Healthcare Power of [place];Living will Jeremy Meadows;Living will - Living will;Healthcare Power of Attorney Living will;Healthcare Power of Attorney  Does patient want to make changes to medical advance directive? - - No - Patient declined No - Patient declined - No - Patient declined No - Patient declined  Copy of [place] in Chart? No - copy requested - Yes Yes - - Yes  Would patient like information on creating a medical advance directive? - - - - - - -    Tobacco Social History   Tobacco Use  Smoking Status Never Smoker  Smokeless Tobacco Never Used     Counseling given: No   Clinical Intake:  Pre-visit preparation completed: Yes  Pain : No/denies pain Pain Score: 4      Nutritional Status: BMI 25 -29 Overweight Nutritional Risks: None Diabetes: No  How often do you need to have someone help you when you read instructions, pamphlets, or other written materials from your doctor or pharmacy?: 1 - Never What is the last grade level you completed in school?: Bachelor degree  Interpreter Needed?: No  Comments: pt lives with spouse Information  entered by :: LPinson, LPN  Past Medical History:  Diagnosis Date  . Allergic rhinitis, cause unspecified   . Aortic insufficiency    s/p AVR 2015  . Aortic root enlargement (HCC)    thoracic aorta  . Aortic valve stenosis 2015   s/p AVR 2015 [name]) - completed cardiac rehab 08/2014  . Arthritis   . Asthma   . Atrial fibrillation ([place]) 07/23/2014  . Benign neoplasm of colon   . Gallstones    by CT  . History of kidney stones   . History of nephrolithiasis   . HLD (hyperlipidemia)   . Hypertension   . Need for prophylactic antibiotic   . Obstructive sleep apnea (adult) (pediatric)    pt. doesn't use his machine at home.  . Other testicular hypofunction   . Pulmonary nodule, left 2015   2cm, ?sarcoid by biopsy  . Unspecified glaucoma(365.9)    Past Surgical History:  Procedure Laterality Date  . AORTIC VALVE REPLACEMENT N/A 07/02/2014   Procedure: AORTIC VALVE REPLACEMENT (AVR) with 23 Aortic Magna Ease;  Surgeon: Grace Isaac, MD  . ASCENDING AORTIC ROOT REPLACEMENT N/A 07/02/2014   Procedure: supra coronary  ASCENDING AORTIC REPLACEMENT to the Inominate Artery with 61mm Hemashield Platinum with  circulatory arrest;  Surgeon: Grace Isaac, MD  . [place]    . CARDIAC VALVE REPLACEMENT    . CATARACT EXTRACTION Bilateral   . COLONOSCOPY  2013   WNL, rpt 5 yrs [name])  . COLONOSCOPY  2018  1 TA, diverticulosis, rpt 5 yrs [name])  . CORONARY ARTERY BYPASS GRAFT    . EYE SURGERY    . INTRAOPERATIVE TRANSESOPHAGEAL ECHOCARDIOGRAM N/A 07/02/2014  . JOINT REPLACEMENT Right 14   partial replacement  . KNEE SURGERY Right 05/2012   arthroscopic  . KNEE SURGERY Right 12/2013   patella  . LEFT AND RIGHT HEART CATHETERIZATION WITH CORONARY ANGIOGRAM N/A 05/22/2014   no significant obstructive CAD ([name])  . LITHOTRIPSY    . LUNG BIOPSY Left 2015   by IR - benign  . MEDIAL PARTIAL KNEE REPLACEMENT Right 03/2013  . RHINOPLASTY  1980s  . VENTRAL HERNIA  REPAIR  08/26/2015  . VENTRAL HERNIA REPAIR N/A 08/26/2015   Procedure: VENTRAL HERNIA REPAIR WITH MESH ADULT;  Surgeon: Grace Isaac, MD;  [facility]: [facility];  Service: Vascular;  Laterality: N/A;   Family History  Problem Relation Age of Onset  . Cancer Mother        Jaw  . CAD Father 64       MI  . Heart disease Brother   . Heart disease Brother   . Heart disease Brother   . Colon cancer Neg Hx   . Colon polyps Neg Hx   . Esophageal cancer Neg Hx   . Rectal cancer Neg Hx   . Stomach cancer Neg Hx    Social History   Socioeconomic History  . Marital status: Married    Spouse name: Not on file  . Number of children: 2  . Years of education: Not on file  . Highest education level: Not on file  Occupational History  . Occupation: retired  Scientific laboratory technician  . Financial resource strain: Not on file  . Food insecurity:    Worry: Not on file    Inability: Not on file  . Transportation needs:    Medical: Not on file    Non-medical: Not on file  Tobacco Use  . Smoking status: Never Smoker  . Smokeless tobacco: Never Used  Substance and Sexual Activity  . Alcohol use: No    Alcohol/week: 0.0 standard drinks  . Drug use: No  . Sexual activity: Yes  Lifestyle  . Physical activity:    Days per week: Not on file    Minutes per session: Not on file  . Stress: Not on file  Relationships  . Social connections:    Talks on phone: Not on file    Gets together: Not on file    Attends religious service: Not on file    Active member of club or organization: Not on file    Attends meetings of clubs or organizations: Not on file    Relationship status: Not on file  Other Topics Concern  . Not on file  Social History Narrative   Lives with [name]   Occupation: retired, was in Conservator, 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   Activity: softball, walking   Diet: good water, fruits/vegetables daily    Outpatient Encounter Medications as of 07/24/2018  Medication Sig  . albuterol (VENTOLIN HFA) 108 (90  Base) MCG/ACT inhaler Inhale 2 puffs into the lungs every 6 (six) hours as needed for wheezing or shortness of breath.  [name] amLODipine (NORVASC) 2.5 MG tablet Take 1 tablet (2.5 mg total) by mouth daily.  [name] aspirin 81 MG tablet Take 81 mg by mouth daily.  [name] atorvastatin (LIPITOR) 10 MG tablet Take 1 tablet (10 mg total) by mouth daily at 6 PM. KEEP OV.  . brinzolamide (AZOPT) 1 %  ophthalmic suspension Place 1 drop into both eyes 2 (two) times daily.  . cholecalciferol (VITAMIN D) 1000 UNITS tablet Take 1,000 Units by mouth daily.  . Melatonin 5 MG TABS at bedtime.  . metoprolol tartrate (LOPRESSOR) 50 MG tablet Take 1 tablet (50 mg total) by mouth 2 (two) times daily.  . vitamin C (ASCORBIC ACID) 500 MG tablet Take 500 mg by mouth daily.   . [DISCONTINUED] DORZOLAMIDE HCL OP    Facility-Administered Encounter Medications as of 07/24/2018  Medication  . 0.9 %  sodium chloride infusion    Activities of Daily Living In your present state of health, do you have any difficulty performing the following activities: 07/24/2018  Hearing? N  Vision? N  Difficulty concentrating or making decisions? N  Walking or climbing stairs? N  Dressing or bathing? N  Doing errands, shopping? N  Preparing Food and eating ? N  Using the Toilet? N  In the past six months, have you accidently leaked urine? N  Do you have problems with loss of bowel control? N  Managing your Medications? N  Managing your Finances? N  Housekeeping or managing your Housekeeping? N  Some recent data might be hidden    Patient Care Team: Ria Bush, MD as PCP - General (Family Medicine) [name], Peter M, MD as Consulting Physician (Cardiology) Grace Isaac, MD as Consulting Physician (Cardiothoracic Surgery)   Assessment:   This is a routine wellness examination for Adelard.   Hearing Screening   125Hz  250Hz  500Hz  1000Hz  2000Hz  3000Hz  4000Hz  6000Hz  8000Hz   Right ear:   40 0 40  0    Left ear:   40 40 40  0      Vision Screening Comments: Vision exam in Oct 2018; future appt scheduled 08/01/18 with Dr. Macarthur Critchley   Exercise Activities and Dietary recommendations Current Exercise Habits: Home exercise routine, Type of exercise: strength training/weights;stretching;treadmill;Other - see comments(softball), Time (Minutes): 40, Frequency (Times/Week): 3, Weekly Exercise (Minutes/Week): 120, Intensity: Moderate, Exercise limited by: None identified  Goals    . Increase physical activity     Starting 07/24/2018, I will continue to exercise at least 45 min 3 days per week.        Fall Risk Fall Risk  07/24/2018 08/12/2016 08/12/2015 08/05/2014 02/27/2013  Falls in the past year? No No No No No   Depression Screen PHQ 2/9 Scores 07/24/2018 08/12/2016 08/12/2015 08/05/2014  PHQ - 2 Score 0 0 0 0  PHQ- 9 Score 0 - - -    Cognitive Function MMSE - Mini Mental State Exam 07/24/2018 08/12/2016  Orientation to time 5 5  Orientation to Place 5 5  Registration 3 3  Attention/ Calculation 0 0  Recall 3 3  Language- name 2 objects 0 0  Language- repeat 1 1  Language- follow 3 step command 3 3  Language- read & follow direction 0 0  Write a sentence 0 0  Copy design 0 0  Total score 20 20     PLEASE NOTE: A Mini-Cog screen was completed. Maximum score is 20. A value of 0 denotes this part of Folstein MMSE was not completed or the patient failed this part of the Mini-Cog screening.   Mini-Cog Screening Orientation to Time - Max 5 pts Orientation to Place - Max 5 pts Registration - Max 3 pts Recall - Max 3 pts Language Repeat - Max 1 pts Language Follow 3 Step Command - Max 3 pts    Immunization History  Administered Date(s) Administered  . Influenza Split 09/07/2011, 07/04/2012  . Influenza Whole 06/27/2010  . Influenza,inj,Quad PF,6+ Mos 07/03/2013, 06/18/2014, 07/10/2015, 07/28/2016, 07/01/2017, 06/22/2018  . Pneumococcal Conjugate-13 08/05/2014  . Pneumococcal Polysaccharide-23 06/27/2010,  12/02/2011  . Tdap 12/02/2011  . Zoster 09/12/2014    Screening Tests Health Maintenance  Topic Date Due  . DTaP/Tdap/Td (2 - Td) 12/01/2021  . TETANUS/TDAP  12/01/2021  . COLONOSCOPY  03/08/2022  . INFLUENZA VACCINE  Completed  . Hepatitis C Screening  Completed  . PNA vac Low Risk Adult  Completed     Plan:     I have personally reviewed, addressed, and noted the following in the patient's chart:  A. Medical and social history B. Use of alcohol, tobacco or illicit drugs  C. Current medications and supplements D. Functional ability and status E.  Nutritional status F.  Physical activity G. Advance directives H. List of other physicians I.  Hospitalizations, surgeries, and ER visits in previous 12 months J.  [place] to include hearing, vision, cognitive, depression L. Referrals and appointments - none  In addition, I have reviewed and discussed with patient certain preventive protocols, quality metrics, and best practice recommendations. A written personalized care plan for preventive services as well as general preventive health recommendations were provided to patient.  See attached scanned questionnaire for additional information.   Signed,   Lindell Noe, MHA, BS, LPN Health Coach

## 2018-07-24 NOTE — Patient Instructions (Signed)
 Preventive Care for Adults  A healthy lifestyle and preventive care can promote health and wellness. Preventive health guidelines for adults include the following key practices.  . A routine yearly physical is a good way to check with your health care provider about your health and preventive screening. It is a chance to share any concerns and updates on your health and to receive a thorough exam.  . Visit your dentist for a routine exam and preventive care every 6 months. Brush your teeth twice a day and floss once a day. Good oral hygiene prevents tooth decay and gum disease.  . The frequency of eye exams is based on your age, health, family medical history, use  of contact lenses, and other factors. Follow your health care provider's recommendations for frequency of eye exams.  . Eat a healthy diet. Foods like vegetables, fruits, whole grains, low-fat dairy products, and lean protein foods contain the nutrients you need without too many calories. Decrease your intake of foods high in solid fats, added sugars, and salt. Eat the right amount of calories for you. Get information about a proper diet from your health care provider, if necessary.  . Regular physical exercise is one of the most important things you can do for your health. Most adults should get at least 150 minutes of moderate-intensity exercise (any activity that increases your heart rate and causes you to sweat) each week. In addition, most adults need muscle-strengthening exercises on 2 or more days a week.  Silver Sneakers may be a benefit available to you. To determine eligibility, you may visit the website: www.silversneakers.com or contact program at 1-866-584-7389 Mon-Fri between 8AM-8PM.   . Maintain a healthy weight. The body mass index (BMI) is a screening tool to identify possible weight problems. It provides an estimate of body fat based on height and weight. Your health care provider can find your BMI and can help you  achieve or maintain a healthy weight.   For adults 20 years and older: ? A BMI below 18.5 is considered underweight. ? A BMI of 18.5 to 24.9 is normal. ? A BMI of 25 to 29.9 is considered overweight. ? A BMI of 30 and above is considered obese.   . Maintain normal blood lipids and cholesterol levels by exercising and minimizing your intake of saturated fat. Eat a balanced diet with plenty of fruit and vegetables. Blood tests for lipids and cholesterol should begin at age 20 and be repeated every 5 years. If your lipid or cholesterol levels are high, you are over 50, or you are at high risk for heart disease, you may need your cholesterol levels checked more frequently. Ongoing high lipid and cholesterol levels should be treated with medicines if diet and exercise are not working.  . If you smoke, find out from your health care provider how to quit. If you do not use tobacco, please do not start.  . If you choose to drink alcohol, please do not consume more than 2 drinks per day. One drink is considered to be 12 ounces (355 mL) of beer, 5 ounces (148 mL) of wine, or 1.5 ounces (44 mL) of liquor.  . If you are 55-79 years old, ask your health care provider if you should take aspirin to prevent strokes.  . Use sunscreen. Apply sunscreen liberally and repeatedly throughout the day. You should seek shade when your shadow is shorter than you. Protect yourself by wearing long sleeves, pants, a wide-brimmed hat, and sunglasses   year round, whenever you are outdoors.  . Once a month, do a whole body skin exam, using a mirror to look at the skin on your back. Tell your health care provider of new moles, moles that have irregular borders, moles that are larger than a pencil eraser, or moles that have changed in shape or color.     

## 2018-07-24 NOTE — Progress Notes (Signed)
PCP notes:   Health maintenance:  No gaps identified.  Abnormal screenings:   Hearing - failed  Hearing Screening   125Hz  250Hz  500Hz  1000Hz  2000Hz  3000Hz  4000Hz  6000Hz  8000Hz   Right ear:   40 0 40  0    Left ear:   40 40 40  0    Vision Screening Comments: Vision exam in Oct 2018; future appt scheduled 08/01/18 with Dr. Macarthur Critchley   Patient concerns:   Patient reported he is having right rotator cuff repair surgery on 08/18/18 with Dr. Jefm Bryant.   Nurse concerns:  None  Next PCP appt:   08/08/18 @ 1030

## 2018-08-01 ENCOUNTER — Encounter: Payer: Medicare HMO | Admitting: Family Medicine

## 2018-08-01 DIAGNOSIS — H401131 Primary open-angle glaucoma, bilateral, mild stage: Secondary | ICD-10-CM | POA: Diagnosis not present

## 2018-08-08 ENCOUNTER — Encounter: Payer: Self-pay | Admitting: Family Medicine

## 2018-08-08 ENCOUNTER — Ambulatory Visit (INDEPENDENT_AMBULATORY_CARE_PROVIDER_SITE_OTHER): Payer: Medicare HMO | Admitting: Family Medicine

## 2018-08-08 ENCOUNTER — Ambulatory Visit (INDEPENDENT_AMBULATORY_CARE_PROVIDER_SITE_OTHER)
Admission: RE | Admit: 2018-08-08 | Discharge: 2018-08-08 | Disposition: A | Discharge status: Home / Self Care | Payer: Medicare HMO | Source: Ambulatory Visit | Attending: Family Medicine | Admitting: Family Medicine

## 2018-08-08 VITALS — BP 128/78 | HR 68 | Temp 97.8°F | Ht 72.0 in | Wt 217.0 lb

## 2018-08-08 DIAGNOSIS — Z952 Presence of prosthetic heart valve: Secondary | ICD-10-CM | POA: Diagnosis not present

## 2018-08-08 DIAGNOSIS — Z7189 Other specified counseling: Secondary | ICD-10-CM | POA: Diagnosis not present

## 2018-08-08 DIAGNOSIS — H409 Unspecified glaucoma: Secondary | ICD-10-CM | POA: Diagnosis not present

## 2018-08-08 DIAGNOSIS — R7303 Prediabetes: Secondary | ICD-10-CM

## 2018-08-08 DIAGNOSIS — I700 Atherosclerosis of aorta: Secondary | ICD-10-CM

## 2018-08-08 DIAGNOSIS — D869 Sarcoidosis, unspecified: Secondary | ICD-10-CM

## 2018-08-08 DIAGNOSIS — I10 Essential (primary) hypertension: Secondary | ICD-10-CM

## 2018-08-08 DIAGNOSIS — Z0000 Encounter for general adult medical examination without abnormal findings: Secondary | ICD-10-CM

## 2018-08-08 DIAGNOSIS — I7121 Aneurysm of the ascending aorta, without rupture: Secondary | ICD-10-CM

## 2018-08-08 DIAGNOSIS — I712 Thoracic aortic aneurysm, without rupture: Secondary | ICD-10-CM | POA: Diagnosis not present

## 2018-08-08 DIAGNOSIS — E7800 Pure hypercholesterolemia, unspecified: Secondary | ICD-10-CM | POA: Diagnosis not present

## 2018-08-08 DIAGNOSIS — I517 Cardiomegaly: Secondary | ICD-10-CM | POA: Diagnosis not present

## 2018-08-08 NOTE — Progress Notes (Signed)
BP 128/78 (BP Location: Left Arm, Patient Position: Sitting, Cuff Size: Normal)   Pulse 68   Temp 97.8 F (36.6 C) (Oral)   Ht 6' (1.829 m)   Wt 217 lb (98.4 kg)   SpO2 96%   BMI 29.43 kg/m    CC: CPE Subjective:    Patient ID: Jeremy Meadows, male    DOB: [birth year], 72 y.o.   MRN: [id number]  HPI: Jeremy Meadows is a 72 y.o. male presenting on 08/08/2018 for Annual Exam (Pt 2. )   Saw Jeremy Meadows last week for medicare wellness visit. Note reviewed. Upcoming R RTC repair 11/22 Jeremy Meadows)   Regularly sees cardiology for aortic stenosis and thoracic aortic aneurysm s/p pericardial tissue AVR and aneurysm graft (Jeremy Meadows 2015). Amlodipine was added earlier this year.   Preventative: COLONOSCOPY 2018 - 1 TA, diverticulosis, rpt 5 yrs Jeremy Meadows) Prostate cancer screening - h/o BPH previously saw Jeremy Meadows, transferred to Jeremy Meadows. DRE normal 2018, PSA normal yearly.  Flu shot yearly Pneumovax 2013, prevnar 07/2014 Tdap 11/2011 zostavax - 08/2014 shingrix - discussed  Advanced directives: scanned 07/2018. [name] is Economist, then sons Engineer, maintenance. Does not want prolonged life support if terminal condition, but ok with artificial nutrition/hydration.  Seat belt use discussed.  Sunscreen use discussed. No changing moles on skin.  Non smoker Alcohol - none Dentist q6 mo Eye exam yearly  Lives with [name] in [employer] - no agent orange exposure Occupation: retired, was in IT department Activity: softball, walking 40 min 3d/wk Diet: good water, fruits/vegetables daily   Relevant past medical, surgical, family and social history reviewed and updated as indicated. Interim medical history since our last visit reviewed. Allergies and medications reviewed and updated. Outpatient Medications Prior to Visit  Medication Sig Dispense Refill  . albuterol (VENTOLIN HFA) 108 (90 Base) MCG/ACT inhaler Inhale 2 puffs into the lungs every 6 (six) hours as  needed for wheezing or shortness of breath. 3 Inhaler 1  . amLODipine (NORVASC) 2.5 MG tablet Take 1 tablet (2.5 mg total) by mouth daily. 90 tablet 3  . aspirin 81 MG tablet Take 81 mg by mouth daily.    [name] atorvastatin (LIPITOR) 10 MG tablet Take 1 tablet (10 mg total) by mouth daily at 6 PM. KEEP OV. 90 tablet 1  . cholecalciferol (VITAMIN D) 1000 UNITS tablet Take 1,000 Units by mouth daily.    . dorzolamide (TRUSOPT) 2 % ophthalmic solution Apply 1 drop to eye 2 (two) times daily.    . Melatonin 5 MG TABS at bedtime.    . metoprolol tartrate (LOPRESSOR) 50 MG tablet Take 1 tablet (50 mg total) by mouth 2 (two) times daily. 180 tablet 3  . vitamin C (ASCORBIC ACID) 500 MG tablet Take 500 mg by mouth daily.     . brinzolamide (AZOPT) 1 % ophthalmic suspension Place 1 drop into both eyes 2 (two) times daily.     Facility-Administered Medications Prior to Visit  Medication Dose Route Frequency Provider Last Rate Last Dose  . 0.9 %  sodium chloride infusion  500 mL Intravenous Continuous [name], MD         Per HPI unless specifically indicated in ROS section below Review of Systems     Objective:    BP 128/78 (BP Location: Left Arm, Patient Position: Sitting, Cuff Size: Normal)   Pulse 68   Temp 97.8 F (36.6 C) (Oral)   Ht 6' (1.829 m)   Wt  217 lb (98.4 kg)   SpO2 96%   BMI 29.43 kg/m   Wt Readings from Last 3 Encounters:  08/08/18 217 lb (98.4 kg)  07/24/18 214 lb 12 oz (97.4 kg)  05/30/18 211 lb (95.7 kg)    Physical Exam Results for orders placed or performed in visit on 07/24/18  Hemoglobin A1c  Result Value Ref Range   Hgb A1c MFr Bld 5.6 4.6 - 6.5 %      Assessment & Meadows:   Problem List Items Addressed This Visit    Thoracic ascending aortic aneurysm [facility])    S/p graft repair, sees cards regularly planned rpt echo next year.       Thoracic aortic atherosclerosis (HCC)    Continue aspirin, statin.       Sarcoidosis    Asxs. Update CXR today.  Latest CT scan 08/2016 reviewed with patient, reassuring.       Relevant Orders   DG Chest 2 View   S/P AVR (aortic valve replacement) and aortoplasty   Prediabetes    A1c normal on latest check.       HYPERCHOLESTEROLEMIA    Chronic, trig elevated, low HDL. LDL at goal. Continue low dose lipitor.  The ASCVD Risk score Mikey Bussing DC Jr., et al., 2013) failed to calculate for the following reasons:   The valid total cholesterol range is 130 to 320 mg/dL       Health maintenance examination - Primary    Preventative protocols reviewed and updated unless pt declined. Discussed healthy diet and lifestyle.       Glaucoma    Continue yearly eye exam with ophtho.      Relevant Medications   dorzolamide (TRUSOPT) 2 % ophthalmic solution   Essential hypertension    Chronic, improved with addition of amlodipine.       Advanced care planning/counseling discussion    Advanced directives: scanned 07/2018. [name] is Economist, then sons Engineer, maintenance. Does not want prolonged life support if terminal condition, but ok with artificial nutrition/hydration.           No orders of the defined types were placed in this encounter.  Orders Placed This Encounter  Procedures  . DG Chest 2 View    Standing Status:   Future    Number of Occurrences:   1    Standing Expiration Date:   10/09/2019    Order Specific Question:   Reason for Exam (SYMPTOM  OR DIAGNOSIS REQUIRED)    Answer:   f/u sarcoidosis    Order Specific Question:   Preferred imaging location?    Answer:   [facility]    Order Specific Question:   Radiology Contrast Protocol - do NOT remove file path    Answer:   [email or web address]    Follow up Meadows: Return in about 1 year (around 08/09/2019) for annual exam, prior fasting for blood work, medicare wellness visit.  Ria Bush, MD

## 2018-08-08 NOTE — Assessment & Plan Note (Signed)
Chronic, improved with addition of amlodipine.

## 2018-08-08 NOTE — Assessment & Plan Note (Addendum)
S/p graft repair, sees cards regularly planned rpt echo next year.

## 2018-08-08 NOTE — Assessment & Plan Note (Signed)
Continue yearly eye exam with ophtho.

## 2018-08-08 NOTE — Patient Instructions (Addendum)
If interested, check with pharmacy about new 2 shot shingles series (shingrix).  Chest xray today.  You are doing well today.  Return as needed or in 1 year for next physical.   Health Maintenance, Male A healthy lifestyle and preventive care is important for your health and wellness. Ask your health care provider about what schedule of regular examinations is right for you. What should I know about weight and diet? Eat a Healthy Diet  Eat plenty of vegetables, fruits, whole grains, low-fat dairy products, and lean protein.  Do not eat a lot of foods high in solid fats, added sugars, or salt.  Maintain a Healthy Weight Regular exercise can help you achieve or maintain a healthy weight. You should:  Do at least 150 minutes of exercise each week. The exercise should increase your heart rate and make you sweat (moderate-intensity exercise).  Do strength-training exercises at least twice a week.  Watch Your Levels of Cholesterol and Blood Lipids  Have your blood tested for lipids and cholesterol every 5 years starting at 72 years of age. If you are at high risk for heart disease, you should start having your blood tested when you are 72 years old. You may need to have your cholesterol levels checked more often if: ? Your lipid or cholesterol levels are high. ? You are older than 72 years of age. ? You are at high risk for heart disease.  What should I know about cancer screening? Many types of cancers can be detected early and may often be prevented. Lung Cancer  You should be screened every year for lung cancer if: ? You are a current smoker who has smoked for at least 30 years. ? You are a former smoker who has quit within the past 15 years.  Talk to your health care provider about your screening options, when you should start screening, and how often you should be screened.  Colorectal Cancer  Routine colorectal cancer screening usually begins at 72 years of age and should be  repeated every 5-10 years until you are 72 years old. You may need to be screened more often if early forms of precancerous polyps or small growths are found. Your health care provider may recommend screening at an earlier age if you have risk factors for colon cancer.  Your health care provider may recommend using home test kits to check for hidden blood in the stool.  A small camera at the end of a tube can be used to examine your colon (sigmoidoscopy or colonoscopy). This checks for the earliest forms of colorectal cancer.  Prostate and Testicular Cancer  Depending on your age and overall health, your health care provider may do certain tests to screen for prostate and testicular cancer.  Talk to your health care provider about any symptoms or concerns you have about testicular or prostate cancer.  Skin Cancer  Check your skin from head to toe regularly.  Tell your health care provider about any new moles or changes in moles, especially if: ? There is a change in a mole's size, shape, or color. ? You have a mole that is larger than a pencil eraser.  Always use sunscreen. Apply sunscreen liberally and repeat throughout the day.  Protect yourself by wearing long sleeves, pants, a wide-brimmed hat, and sunglasses when outside.  What should I know about heart disease, diabetes, and high blood pressure?  If you are 38-72 years of age, have your blood pressure checked every 3-5 years.  If you are 65 years of age or older, have your blood pressure checked every year. You should have your blood pressure measured twice-once when you are at a hospital or clinic, and once when you are not at a hospital or clinic. Record the average of the two measurements. To check your blood pressure when you are not at a hospital or clinic, you can use: ? An automated blood pressure machine at a pharmacy. ? A home blood pressure monitor.  Talk to your health care provider about your target blood  pressure.  If you are between 46-77 years old, ask your health care provider if you should take aspirin to prevent heart disease.  Have regular diabetes screenings by checking your fasting blood sugar level. ? If you are at a normal weight and have a low risk for diabetes, have this test once every three years after the age of 70. ? If you are overweight and have a high risk for diabetes, consider being tested at a younger age or more often.  A one-time screening for abdominal aortic aneurysm (AAA) by ultrasound is recommended for men aged 46-75 years who are current or former smokers. What should I know about preventing infection? Hepatitis B If you have a higher risk for hepatitis B, you should be screened for this virus. Talk with your health care provider to find out if you are at risk for hepatitis B infection. Hepatitis C Blood testing is recommended for:  Everyone born from [birth year] through 1965.  Anyone with known risk factors for hepatitis C.  Sexually Transmitted Diseases (STDs)  You should be screened each year for STDs including gonorrhea and chlamydia if: ? You are sexually active and are younger than 72 years of age. ? You are older than 72 years of age and your health care provider tells you that you are at risk for this type of infection. ? Your sexual activity has changed since you were last screened and you are at an increased risk for chlamydia or gonorrhea. Ask your health care provider if you are at risk.  Talk with your health care provider about whether you are at high risk of being infected with HIV. Your health care provider may recommend a prescription medicine to help prevent HIV infection.  What else can I do?  Schedule regular health, dental, and eye exams.  Stay current with your vaccines (immunizations).  Do not use any tobacco products, such as cigarettes, chewing tobacco, and e-cigarettes. If you need help quitting, ask your health care  provider.  Limit alcohol intake to no more than 2 drinks per day. One drink equals 12 ounces of beer, 5 ounces of wine, or 1 ounces of hard liquor.  Do not use street drugs.  Do not share needles.  Ask your health care provider for help if you need support or information about quitting drugs.  Tell your health care provider if you often feel depressed.  Tell your health care provider if you have ever been abused or do not feel safe at home. This information is not intended to replace advice given to you by your health care provider. Make sure you discuss any questions you have with your health care provider. Document Released: 03/11/2008 Document Revised: 05/12/2016 Document Reviewed: 06/17/2015 Elsevier Interactive Patient Education  [employer].

## 2018-08-08 NOTE — Assessment & Plan Note (Signed)
Chronic, trig elevated, low HDL. LDL at goal. Continue low dose lipitor.  The ASCVD Risk score Mikey Bussing DC Jr., et al., 2013) failed to calculate for the following reasons:   The valid total cholesterol range is 130 to 320 mg/dL

## 2018-08-08 NOTE — Assessment & Plan Note (Signed)
Asxs. Update CXR today. Latest CT scan 08/2016 reviewed with patient, reassuring.

## 2018-08-08 NOTE — Assessment & Plan Note (Signed)
Continue aspirin, statin.  

## 2018-08-08 NOTE — Assessment & Plan Note (Signed)
Advanced directives: scanned 07/2018. [name] is Economist, then sons Engineer, maintenance. Does not want prolonged life support if terminal condition, but ok with artificial nutrition/hydration.

## 2018-08-08 NOTE — Assessment & Plan Note (Signed)
A1c normal on latest check.

## 2018-08-08 NOTE — Assessment & Plan Note (Signed)
Preventative protocols reviewed and updated unless pt declined. Discussed healthy diet and lifestyle.  

## 2018-08-10 NOTE — Discharge Instructions (Signed)
General Anesthesia, Adult, Care After °These instructions provide you with information about caring for yourself after your procedure. Your health care provider may also give you more specific instructions. Your treatment has been planned according to current medical practices, but problems sometimes occur. Call your health care provider if you have any problems or questions after your procedure. °What can I expect after the procedure? °After the procedure, it is common to have: °· Vomiting. °· A sore throat. °· Mental slowness. ° °It is common to feel: °· Nauseous. °· Cold or shivery. °· Sleepy. °· Tired. °· Sore or achy, even in parts of your body where you did not have surgery. ° °Follow these instructions at home: °For at least 24 hours after the procedure: °· Do not: °? Participate in activities where you could fall or become injured. °? Drive. °? Use heavy machinery. °? Drink alcohol. °? Take sleeping pills or medicines that cause drowsiness. °? Make important decisions or sign legal documents. °? Take care of children on your own. °· Rest. °Eating and drinking °· If you vomit, drink water, juice, or soup when you can drink without vomiting. °· Drink enough fluid to keep your urine clear or pale yellow. °· Make sure you have little or no nausea before eating solid foods. °· Follow the diet recommended by your health care provider. °General instructions °· Have a responsible adult stay with you until you are awake and alert. °· Return to your normal activities as told by your health care provider. Ask your health care provider what activities are safe for you. °· Take over-the-counter and prescription medicines only as told by your health care provider. °· If you smoke, do not smoke without supervision. °· Keep all follow-up visits as told by your health care provider. This is important. °Contact a health care provider if: °· You continue to have nausea or vomiting at home, and medicines are not helpful. °· You  cannot drink fluids or start eating again. °· You cannot urinate after 8-12 hours. °· You develop a skin rash. °· You have fever. °· You have increasing redness at the site of your procedure. °Get help right away if: °· You have difficulty breathing. °· You have chest pain. °· You have unexpected bleeding. °· You feel that you are having a life-threatening or urgent problem. °This information is not intended to replace advice given to you by your health care provider. Make sure you discuss any questions you have with your health care provider. °Document Released: 12/20/2000 Document Revised: 02/16/2016 Document Reviewed: 08/28/2015 °Elsevier Interactive Patient Education © 2018 [employer] ° °

## 2018-08-14 DIAGNOSIS — M25511 Pain in right shoulder: Secondary | ICD-10-CM | POA: Diagnosis not present

## 2018-08-15 ENCOUNTER — Telehealth: Payer: Self-pay | Admitting: Cardiology

## 2018-08-15 NOTE — Telephone Encounter (Signed)
He should not take Mobic due to history of CKD and HTN. Should avoid all NSAIDs. The Tizanidine is OK.  Cornell Bourbon [name] MD, [facility]

## 2018-08-15 NOTE — Telephone Encounter (Signed)
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

## 2018-08-15 NOTE — Telephone Encounter (Signed)
New message:   Patient went to his dr. And they put him on  mobic and tizanidine.patient would like to know if these medications are ok to take.

## 2018-08-15 NOTE — Telephone Encounter (Signed)
Returned call to patient Dr.Jordan's recommendation given.

## 2018-08-18 ENCOUNTER — Ambulatory Visit: Admit: 2018-08-18 | Payer: Medicare HMO | Admitting: Unknown Physician Specialty

## 2018-08-18 SURGERY — ARTHROSCOPY, SHOULDER
Anesthesia: Choice | Laterality: Right

## 2018-09-11 NOTE — Progress Notes (Signed)
I reviewed health advisor's note, was available for consultation, and agree with documentation and plan.  

## 2018-09-15 ENCOUNTER — Ambulatory Visit: Payer: Medicare HMO | Attending: Orthopedic Surgery

## 2018-09-15 ENCOUNTER — Other Ambulatory Visit: Payer: Self-pay

## 2018-09-15 DIAGNOSIS — R293 Abnormal posture: Secondary | ICD-10-CM | POA: Insufficient documentation

## 2018-09-15 DIAGNOSIS — M6281 Muscle weakness (generalized): Secondary | ICD-10-CM

## 2018-09-15 DIAGNOSIS — M25511 Pain in right shoulder: Secondary | ICD-10-CM | POA: Diagnosis not present

## 2018-09-15 DIAGNOSIS — M25611 Stiffness of right shoulder, not elsewhere classified: Secondary | ICD-10-CM | POA: Insufficient documentation

## 2018-09-15 NOTE — Therapy (Signed)
[place] MAIN [facility] SERVICES [address] [place], [place], [address] Phone: [phone]   Fax:  [phone]  Physical Therapy Evaluation  Patient Details  Name: Jeremy Meadows MRN: [id number] Date of Birth: 01/23/1946 Referring Provider (PT): Dr. Vonna Drafts   Encounter Date: 09/15/2018  PT End of Session - 09/15/18 0939    Visit Number  1    Number of Visits  8    Date for PT Re-Evaluation  10/13/18    Authorization Type  1/10 PN start 12/20    PT Start Time  0801    PT Stop Time  0854    PT Time Calculation (min)  53 min    Activity Tolerance  Patient tolerated treatment well    Behavior During Therapy  [facility] for tasks assessed/performed       Past Medical History:  Diagnosis Date  . Allergic rhinitis, cause unspecified   . Aortic insufficiency    s/p AVR 2015  . Aortic root enlargement (HCC)    thoracic aorta  . Aortic valve stenosis 2015   s/p AVR 2015 [name]) - completed cardiac rehab 08/2014  . Arthritis   . Asthma   . Atrial fibrillation ([place]) 07/23/2014  . Benign neoplasm of colon   . Gallstones    by CT  . History of kidney stones   . History of nephrolithiasis   . HLD (hyperlipidemia)   . Hypertension   . Need for prophylactic antibiotic   . Obstructive sleep apnea (adult) (pediatric)    pt. doesn't use his machine at home.  . Other testicular hypofunction   . Pulmonary nodule, left 2015   2cm, ?sarcoid by biopsy  . Unspecified glaucoma(365.9)     Past Surgical History:  Procedure Laterality Date  . AORTIC VALVE REPLACEMENT N/A 07/02/2014   Procedure: AORTIC VALVE REPLACEMENT (AVR) with 23 Aortic Magna Ease;  Surgeon: Grace Isaac, MD  . ASCENDING AORTIC ROOT REPLACEMENT N/A 07/02/2014   Procedure: supra coronary  ASCENDING AORTIC REPLACEMENT to the Inominate Artery with 9mm Hemashield Platinum with  circulatory arrest;  Surgeon: Grace Isaac, MD  . [place]    . CARDIAC  VALVE REPLACEMENT    . CATARACT EXTRACTION Bilateral   . COLONOSCOPY  2013   WNL, rpt 5 yrs [name])  . COLONOSCOPY  2018   1 TA, diverticulosis, rpt 5 yrs [name])  . CORONARY ARTERY BYPASS GRAFT    . EYE SURGERY    . INTRAOPERATIVE TRANSESOPHAGEAL ECHOCARDIOGRAM N/A 07/02/2014  . JOINT REPLACEMENT Right 14   partial replacement  . KNEE SURGERY Right 05/2012   arthroscopic  . KNEE SURGERY Right 12/2013   patella  . LEFT AND RIGHT HEART CATHETERIZATION WITH CORONARY ANGIOGRAM N/A 05/22/2014   no significant obstructive CAD ([name])  . LITHOTRIPSY    . LUNG BIOPSY Left 2015   by IR - benign  . MEDIAL PARTIAL KNEE REPLACEMENT Right 03/2013  . RHINOPLASTY  1980s  . VENTRAL HERNIA REPAIR  08/26/2015  . VENTRAL HERNIA REPAIR N/A 08/26/2015   Procedure: VENTRAL HERNIA REPAIR WITH MESH ADULT;  Surgeon: Grace Isaac, MD;  Location: [place];  Service: Vascular;  Laterality: N/A;    There were no vitals filed for this visit.   Subjective Assessment - 09/15/18 0931    Subjective  Patient is a pleasant active 72 year old male who presents to physical therapy for R shoulder rotator cuff tear (supra and infra).     Pertinent  History  Patient is a pleasant 72 year old male for right shoulder that began mid September..  Patient has had intermittent shoulder pain for months after he was lifting weights and felt a pop in his shoulder after an overhead movement. MRI imaging per physician note indicated a significant tear of supraspinatus and infraspinatus tendons as well as atrophy of infraspinatus muscle. Moderate tendinosis of intra articular portion of biceps tendon noted.  Patient regularly sees cardiology for aortic stenosis and thoracic aortic aneurysms s/p pericardial tissue AVR and aneurysm graft (2015). 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 He enjoyed softball and walking 40 minutes 3days/week.  Patient has been recommended to get a cortisone surgery and have physical therapy. Wants to  return to softball in march.     Limitations  Lifting;House hold activities;Other (comment)    How long can you sit comfortably?  n/a    How long can you stand comfortably?  n/a    How long can you walk comfortably?  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  Yes    Pain Score  3     Pain Location  Shoulder    Pain Orientation  Right    Pain Descriptors / Indicators  Aching    Pain Type  Acute pain    Pain Onset  More than a month ago    Pain Frequency  Intermittent    Aggravating Factors   morning, sleeping on it    Pain Relieving Factors  corisone shot     Effect of Pain on Daily Activities  limits activities of sport         [facility] PT Assessment - 09/15/18 0001      Assessment   Medical Diagnosis  R rotator cuff tear    Referring Provider (PT)  Dr. Vonna Drafts    Onset Date/Surgical Date  --   mid september   Hand Dominance  Right    Prior Therapy  yes for knee      Precautions   Precautions  None      Restrictions   Weight Bearing Restrictions  No      Balance Screen   Has the patient fallen in the past 6 months  No    Has the patient had a decrease in activity level because of a fear of falling?   No    Is the patient reluctant to leave their home because of a fear of falling?   No      Home Film/video editor residence    Living Arrangements  Spouse/significant other    Available Help at Discharge  Family    Type of [place] to enter    Entrance Stairs-Number of Steps  4    [place]  Two level      Prior Function   Level of [place]  Retired    Teacher, adult education, workout 3x/week, jig saw puzzles             SUBJECTIVE Chief complaint: R shoulder: [place] rotator cuff tear: MRI showing supraspinatus and infraspinatus tear:  moderate tendinosis of biceps Onset: Mid September Shoulder trauma: No  Pain quality: Pain is in the  morning and worse in the bicep and middle therapy.  Pain: 3/10 Present,1 /10 Best, 5/10 Worst Aggravating factors: first thing waking up after sleeping on it Easing factors:cortisone shot reduced pain levels to what it is now 24 hour pain behavior: worse in the morning, eases off during the day  Radiating pain: No Numbness/Tingling: No Prior history of shoulder injury or pain: No  Does not remember having previous injury, but has been playing softball  Prior history of therapy for shoulder: No Follow-up appointment with MD: Yes Dominant hand: Right  OBJECTIVE  MUSCULOSKELETAL: Tremor: Normal Bulk: decreased muscle bulk of R GH (supraspinatus)  Tone: Normal  Cervical Screen AROM: WFL and painless with overpressure in all planes   Elbow Screen Elbow AROM: Within Normal Limits  Palpation Pain to lateral GH sulcus joint (no sulcus sign present) slight tenderness to bicep tendon   Strength R/L 4/5 Shoulder flexion (anterior deltoid/pec major/coracobrachialis, axillary n. (C5-6) and musculocutaneous n. (C5-7)) 4-/5 Shoulder abduction (deltoid/supraspinatus, axillary/suprascapular n, C5) 2+/4+Shoulder external rotation (infraspinatus/teres minor) 4-/5 Shoulder internal rotation (subcapularis/lats/pec major) 4/5 Shoulder extension (posterior deltoid, lats, teres major, axillary/thoracodorsal n.) 4/5 Elbow flexion (biceps brachii, brachialis, brachioradialis, musculoskeletal n, C5-6) 4/5 Elbow extension (triceps, radial n, C7) 5/5 Wrist Extension 5/5 Wrist Flexion 5/5 Finger adduction (interossei, ulnar n, T1)  AROM R/L 180/180 Shoulder flexion (slight elbow flexion of R)  180/180 Shoulder abduction (slight elbow flexion/scaption)  -10/70 Shoulder external rotation+ 60/70 Shoulder internal rotation* 50/60 Shoulder extension *Indicates pain, overpressure performed unless  otherwise indicated  PROM R/L  -9/90 Shoulder external rotation 65/70 Shoulder internal rotation *Indicates pain, overpressure performed unless otherwise indicated  Accessory Motions/Glides Glenohumeral: Posterior: R: abnormal : empty end feel L: normal Inferior: R: abnormal empty end feel L: normal Anterior: R: abnormal empty end feel L: normal  Acromioclavicular:  Posterior: R: abnormal L: normal Anterior: R: abnormal L: normal  Sternoclavicular: Posterior: R: normal L: normal Anterior: R: normal L: normal Superior: R: normal L: normal Inferior: R: normal L: normal  Scapulothoracic: Distraction: R: abnormal L: normal Medial: R: abnormal L: normal Lateral: R: abnormal L: normal Inferior: R: abnormal L: normal Superior: R: abnormal L: normal  Muscle Length Testing Pectoralis Major: R: abnormal tight L: abnormal Pectoralis Minor: R: abnormal L: abnormal Biceps: R: abnormal L: normal  NEUROLOGICAL:  Mental Status Patient is oriented to person, place and time.  Recent memory is intact.  Remote memory is intact.  Attention span and concentration are intact.  Expressive speech is intact.  Patient's fund of knowledge is within normal limits for educational level.  Cranial Nerves Visual acuity and visual fields are intact  Extraocular muscles are intact  Facial sensation is intact bilaterally  Facial strength is intact bilaterally  Hearing is normal as tested by gross conversation Palate elevates midline, normal phonation  Shoulder shrug strength is intact  Tongue protrudes midline   Sensation Grossly intact to light touch bilateral UE as determined by testing dermatomes C2-T2 Proprioception and hot/cold testing deferred on this date  Reflexes R/L 2+/2+ Biceps 2+/2+ Brachioradialis 2+/2+ Triceps    SPECIAL TESTS  Rotator Cuff  Drop Arm Test: Negative (slight pain but can hold)  Painful Arc (Pain from 60 to 120 degrees scaption):  Negative Infraspinatus Muscle Test: Positive If all 3 tests positive, the probability of a full-thickness rotator cuff tear is 91%  Subacromial Impingement Hawkins-Kennedy: Negative Neer (Block scapula, PROM flexion): Positive Painful Arc (Pain from 60 to 120 degrees scaption): Negative Empty Can: Positive External Rotation Resistance:  Positive Horizontal Adduction: Not done Scapular Assist: Not done Positive Hawkins-Kennedy, Painful arc sign, Infraspinatus muscle test then +LR: 10.56 of some type of impingement present, 2/3 tests: +LR 5.06, -LR 0.17, Positive 3/5 Hawkins-Kennedy, neer, painful arc, empty can, and external rotation resistance then SN: .75 (.54-.96) SP: .74 (.61-.88) +LR: 2.93 (1.60-5.36) -LR: .34 (.14-.80)    Bicep Tendon Pathology Speed (shoulder flexion to 90, external rotation, full elbow extension, and forearm supination with resistance: Positive Yergason's (resisted shoulder ER and supination/biceps tendon pathology): Positive  Shoulder Instability Sulcus Sign: Negative  Outcome Measures Quick DASH: Sport 37.5%, general; 11.36%        No personal factors/comorbidities, 2 or less body systems/activity limitations/participation restrictions   2 personal factors/comorbidities,  PLAN HEP:  Access Code: [email or web address]  URL: [email or web address]  Date: 09/15/2018  Prepared by: Janna Arch   Exercises  Seated Scapular Retraction - 10 reps - 2 sets - 3 hold - 1x daily - 7x weekly  Isometric Shoulder Flexion at Wall - 10 reps - 1 sets - 3 hold - 1x daily - 7x weekly  Isometric Shoulder External Rotation at Wall - 10 reps - 1 sets - 3 hold - 1x daily - 7x weekly  Standing Isometric Shoulder Internal Rotation at Doorway - 10 reps - 1 sets - 3 hold - 1x daily - 7x weekly  Isometric Shoulder Abduction at Wall - 10 reps - 1 sets - 3 hold - 1x daily - 7x weekly   Next Visit: supine: GH stabilization, review isometrics, body  blade          Objective measurements completed on examination: See above findings.              PT Education - 09/15/18 0939    Education Details  goals, POC, HEP     Person(s) Educated  Patient    Methods  Explanation;Demonstration;Tactile cues;Verbal cues;Handout    Comprehension  Verbalized understanding;Returned demonstration;Verbal cues required;Tactile cues required;Need further instruction       PT Short Term Goals - 09/15/18 0951      PT SHORT TERM GOAL #1   Title  Pt will be independent with HEP in order to improve strength and decrease pain in order to improve pain-free function at home and work.    Baseline  12/20: HEP given     Time  2    Period  Weeks    Status  New    Target Date  10/13/18      PT SHORT TERM GOAL #2   Title  Pt will decrease worst pain as reported on NPRS  to 3/10  in order to demonstrate clinically significant reduction in pain.    Baseline  12/20: 5/10     Time  2    Period  Weeks    Status  New    Target Date  10/13/18        PT Long Term Goals - 09/15/18 0951      PT LONG TERM GOAL #1   Title  Pt will decrease worst pain as reported on NPRS to 2/10 in order to demonstrate clinically significant reduction in pain.    Baseline  12/20: 5/10     Time  4    Period  Weeks    Status  New    Target Date  10/13/18      PT LONG TERM GOAL #2   Title  Pt will increase strength of R shoulder to 4+/5 in order to demonstrate  improvement in strength and function     Baseline  12/20: R gross 4-/5 with ER 2+/5    Time  4    Period  Weeks    Status  New    Target Date  10/13/18      PT LONG TERM GOAL #3   Title  Patient will return to softball and gym program with no limitations or compensations for return to PLOF.     Baseline  12/20: unable to throw ball, has to alter gym program    Time  4    Period  Weeks    Status  Unable to assess    Target Date  10/13/18      PT LONG TERM GOAL #4   Title  Pt will decrease quick  DASH Sport score by at least 8%  to 29.5% in order to demonstrate clinically significant reduction in disability.    Baseline  12/20: 37.5%    Time  4    Period  Weeks    Status  New    Target Date  10/13/18      PT LONG TERM GOAL #5   Title  Patient will increase R shoulder ER in standing actively to neutral position without compensatory patterning for return to proper throwing position.     Baseline  12/20: -10 degrees    Time  4    Period  Weeks    Status  New    Target Date  10/13/18             Plan - 09/15/18 0940    Clinical Impression Statement  Pt is a pleasant 72 year-old male referred for R shoulder rotator cuff tear.MRI imaging per physician note indicated a significant tear of supraspinatus and infraspinatus tendons as well as atrophy of infraspinatus muscle. Moderate tendinosis of intra articular portion of biceps tendon noted.  Patient regularly sees cardiology for aortic stenosis and thoracic aortic aneurysms s/p pericardial tissue AVR and aneurysm graft (2015). PT examination reveals deficits in external rotation, end motion strength, posture, and throwing mechanisms. Patient has excessive forward head rounded shoulders positioning bilaterally with R hiking of shoulder. Patient is limited in his ability to reach full external rotation with noted limitation both bony and muscular.  Pain only noted with external rotation, internal rotation, and end range motions. Pt will benefit from PT services to address deficits in strength, mobility, and pain in order to return to full function at home and to softball with less shoulder pain.    History and Personal Factors relevant to plan of care:  2 or less body systems/activity limitations/participation restrictions      Clinical Presentation  Stable    Clinical Presentation due to:  high pain tolerance, high previous level of function,     Clinical Decision Making  Low    Rehab Potential  Excellent    Clinical Impairments  Affecting Rehab Potential  (+) previous level of function, high pain tolerance (-) chronicity of R shoulder activation, concurent cardiac complications    PT Frequency  2x / week    PT Duration  4 weeks    PT Treatment/Interventions  ADLs/Self Care Home Management;Aquatic Therapy;Biofeedback;Cryotherapy;Electrical Stimulation;Iontophoresis 4mg /ml Dexamethasone;Moist Heat;Traction;Ultrasound;Therapeutic exercise;Therapeutic activities;Functional mobility training;Neuromuscular re-education;Patient/family education;Manual techniques;Compression bandaging;Scar mobilization;Passive range of motion;Vasopneumatic Device;Taping;Splinting;Energy conservation;Dry needling    PT Next Visit Plan  supine: GH stabilization, review isometrics, body blade    PT Home Exercise Plan  see above    Consulted and Agree with Plan  of Care  Patient       Patient will benefit from skilled therapeutic intervention in order to improve the following deficits and impairments:  Cardiopulmonary status limiting activity, Decreased activity tolerance, Decreased endurance, Decreased range of motion, Decreased strength, Hypermobility, Hypomobility, Increased fascial restricitons, Increased edema, Impaired flexibility, Impaired UE functional use, Postural dysfunction, Improper body mechanics, Pain  Visit Diagnosis: Acute pain of right shoulder  Stiffness of right shoulder, not elsewhere classified  Abnormal posture  Muscle weakness     Problem List Patient Active Problem List   Diagnosis Date Noted  . History of colonic polyps 02/18/2017  . Thrombosed external hemorrhoid 02/18/2017  . CAD (coronary artery disease) 10/17/2016  . Thoracic aortic atherosclerosis ([place]) 10/17/2016  . Health maintenance examination 08/12/2015  . Advanced care planning/counseling discussion 08/05/2014  . Medicare annual wellness visit, subsequent 08/05/2014  . Prediabetes 08/05/2014  . 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 S/P AVR  (aortic valve replacement) and aortoplasty 07/23/2014  . Thoracic ascending aortic aneurysm ([place]) 07/02/2014  . Sarcoidosis 04/26/2014  . Asthmatic bronchitis 04/24/2014  . Pulmonary nodule, left   . Gross hematuria 11/06/2013  . Essential hypertension 08/29/2013  . Aortic insufficiency   . Aortic valve stenosis, severe   . DJD (degenerative joint disease) 02/27/2013  . Knee pain 02/27/2013  . Wedge compression fracture of thoracic vertebra ([place]) 02/27/2013  . TESTICULAR HYPOFUNCTION 09/08/2010  . Recurrent nephrolithiasis 08/23/2009  . 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 Glaucoma 08/22/2008  . HYPERCHOLESTEROLEMIA 12/18/2007  . Obstructive sleep apnea 12/18/2007  . ALLERGIC RHINITIS 12/18/2007   Janna Arch, PT, DPT   09/15/2018, 10:50 AM  [place] MAIN [facility] SERVICES [address] [place], [place], [address] Phone: [phone]   Fax:  [phone]  Name: JAXEN SAMPLES MRN: [id number] Date of Birth: May 31, 1946

## 2018-09-15 NOTE — Patient Instructions (Signed)
Access Code: [email or web address]  URL: [email or web address]  Date: 09/15/2018  Prepared by: Janna Arch   Exercises  Seated Scapular Retraction - 10 reps - 2 sets - 3 hold - 1x daily - 7x weekly  Isometric Shoulder Flexion at Wall - 10 reps - 1 sets - 3 hold - 1x daily - 7x weekly  Isometric Shoulder External Rotation at Wall - 10 reps - 1 sets - 3 hold - 1x daily - 7x weekly  Standing Isometric Shoulder Internal Rotation at Doorway - 10 reps - 1 sets - 3 hold - 1x daily - 7x weekly  Isometric Shoulder Abduction at Wall - 10 reps - 1 sets - 3 hold - 1x daily - 7x weekly

## 2018-09-19 ENCOUNTER — Ambulatory Visit: Payer: Medicare HMO

## 2018-09-19 DIAGNOSIS — R293 Abnormal posture: Secondary | ICD-10-CM

## 2018-09-19 DIAGNOSIS — M6281 Muscle weakness (generalized): Secondary | ICD-10-CM | POA: Diagnosis not present

## 2018-09-19 DIAGNOSIS — M25511 Pain in right shoulder: Secondary | ICD-10-CM | POA: Diagnosis not present

## 2018-09-19 DIAGNOSIS — M25611 Stiffness of right shoulder, not elsewhere classified: Secondary | ICD-10-CM | POA: Diagnosis not present

## 2018-09-19 NOTE — Therapy (Signed)
[place] MAIN [facility] SERVICES [address] [place], [place], [address] Phone: [phone]   Fax:  [phone]  Physical Therapy Treatment  Patient Details  Name: Jeremy Meadows MRN: [id number] Date of Birth: 05-09-1946 Referring Provider (PT): Dr. Vonna Drafts   Encounter Date: 09/19/2018  PT End of Session - 09/19/18 0755    Visit Number  2    Number of Visits  8    Date for PT Re-Evaluation  10/13/18    Authorization Type  2/10 PN start 12/20    PT Start Time  0759    PT Stop Time  0844    PT Time Calculation (min)  45 min    Activity Tolerance  Patient tolerated treatment well    Behavior During Therapy  [place] for tasks assessed/performed       Past Medical History:  Diagnosis Date  . Allergic rhinitis, cause unspecified   . Aortic insufficiency    s/p AVR 2015  . Aortic root enlargement (HCC)    thoracic aorta  . Aortic valve stenosis 2015   s/p AVR 2015 [name]) - completed cardiac rehab 08/2014  . Arthritis   . Asthma   . Atrial fibrillation ([place]) 07/23/2014  . Benign neoplasm of colon   . Gallstones    by CT  . History of kidney stones   . History of nephrolithiasis   . HLD (hyperlipidemia)   . Hypertension   . Need for prophylactic antibiotic   . Obstructive sleep apnea (adult) (pediatric)    pt. doesn't use his machine at home.  . Other testicular hypofunction   . Pulmonary nodule, left 2015   2cm, ?sarcoid by biopsy  . Unspecified glaucoma(365.9)     Past Surgical History:  Procedure Laterality Date  . AORTIC VALVE REPLACEMENT N/A 07/02/2014   Procedure: AORTIC VALVE REPLACEMENT (AVR) with 23 Aortic Magna Ease;  Surgeon: Grace Isaac, MD  . ASCENDING AORTIC ROOT REPLACEMENT N/A 07/02/2014   Procedure: supra coronary  ASCENDING AORTIC REPLACEMENT to the Inominate Artery with 95mm Hemashield Platinum with  circulatory arrest;  Surgeon: Grace Isaac, MD  . [place]    . CARDIAC  VALVE REPLACEMENT    . CATARACT EXTRACTION Bilateral   . COLONOSCOPY  2013   WNL, rpt 5 yrs [name])  . COLONOSCOPY  2018   1 TA, diverticulosis, rpt 5 yrs [name])  . CORONARY ARTERY BYPASS GRAFT    . EYE SURGERY    . INTRAOPERATIVE TRANSESOPHAGEAL ECHOCARDIOGRAM N/A 07/02/2014  . JOINT REPLACEMENT Right 14   partial replacement  . KNEE SURGERY Right 05/2012   arthroscopic  . KNEE SURGERY Right 12/2013   patella  . LEFT AND RIGHT HEART CATHETERIZATION WITH CORONARY ANGIOGRAM N/A 05/22/2014   no significant obstructive CAD ([name])  . LITHOTRIPSY    . LUNG BIOPSY Left 2015   by IR - benign  . MEDIAL PARTIAL KNEE REPLACEMENT Right 03/2013  . RHINOPLASTY  1980s  . VENTRAL HERNIA REPAIR  08/26/2015  . VENTRAL HERNIA REPAIR N/A 08/26/2015   Procedure: VENTRAL HERNIA REPAIR WITH MESH ADULT;  Surgeon: Grace Isaac, MD;  Location: [place];  Service: Vascular;  Laterality: N/A;    There were no vitals filed for this visit.  Subjective Assessment - 09/19/18 0850    Subjective  Patient reports compliance with HEP everyday, even did it this morning. Reports minimal-no pain upon start of PT.     Pertinent History  Patient is a pleasant  72 year old male for right shoulder that began mid September..  Patient has had intermittent shoulder pain for months after he was lifting weights and felt a pop in his shoulder after an overhead movement. MRI imaging per physician note indicated a significant tear of supraspinatus and infraspinatus tendons as well as atrophy of infraspinatus muscle. Moderate tendinosis of intra articular portion of biceps tendon noted.  Patient regularly sees cardiology for aortic stenosis and thoracic aortic aneurysms s/p pericardial tissue AVR and aneurysm graft (2015). 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 He enjoyed softball and walking 40 minutes 3days/week.  Patient has been recommended to get a cortisone surgery and have physical therapy. Wants to return to softball  in march.     Limitations  Lifting;House hold activities;Other (comment)    How long can you sit comfortably?  n/a    How long can you stand comfortably?  n/a    How long can you walk comfortably?  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  No/denies       Review isometrics HEP:   Exercises   Seated Scapular Retraction - 10 reps - 2 sets - 3 hold - 1x daily - 7x weekly   Isometric Shoulder Flexion at Wall - 10 reps - 1 sets - 3 hold - 1x daily - 7x weekly   Isometric Shoulder External Rotation at Wall - 10 reps - 1 sets - 3 hold - 1x daily - 7x weekly   Standing Isometric Shoulder Internal Rotation at Doorway - 10 reps - 1 sets - 3 hold - 1x daily - 7x weekly  Isometric Shoulder Abduction at Wall - 10 reps - 1 sets - 3 hold - 1x daily - 7x weekly   Supine:  GH stabilization with pertubations at 90 90 for 3x30 seconds  ER PROM x10 x 20 second holds ER AAROM x10 x 20 second holds with dowel Robber stretch 30 seconds Pectoral STM/trigger point, subscap STM 4 minutes Bicep STM/trigger point with movement (flexion/extension) x 3 minutes   Standing  Body blade: standing flexion x 6, abduction x 6: stop at 90 degrees YTB ER walks 5x; extremely challenging to keep elbow in line with wrist Row with cable: 15lb : 15x; verbal cues for biomechanics                           PT Education - 09/19/18 0755    Education Details  exercise technique, stability, strength    Person(s) Educated  Patient    Methods  Explanation;Demonstration;Tactile cues;Verbal cues    Comprehension  Verbalized understanding;Returned demonstration;Tactile cues required;Need further instruction;Verbal cues required       PT Short Term Goals - 09/15/18 0951      PT SHORT TERM GOAL #1   Title  Pt will be independent with HEP in order to improve  strength and decrease pain in order to improve pain-free function at home and work.    Baseline  12/20: HEP given     Time  2    Period  Weeks    Status  New    Target Date  10/13/18      PT SHORT TERM GOAL #2   Title  Pt will decrease worst pain as reported  on NPRS  to 3/10  in order to demonstrate clinically significant reduction in pain.    Baseline  12/20: 5/10     Time  2    Period  Weeks    Status  New    Target Date  10/13/18        PT Long Term Goals - 09/15/18 0951      PT LONG TERM GOAL #1   Title  Pt will decrease worst pain as reported on NPRS to 2/10 in order to demonstrate clinically significant reduction in pain.    Baseline  12/20: 5/10     Time  4    Period  Weeks    Status  New    Target Date  10/13/18      PT LONG TERM GOAL #2   Title  Pt will increase strength of R shoulder to 4+/5 in order to demonstrate improvement in strength and function     Baseline  12/20: R gross 4-/5 with ER 2+/5    Time  4    Period  Weeks    Status  New    Target Date  10/13/18      PT LONG TERM GOAL #3   Title  Patient will return to softball and gym program with no limitations or compensations for return to PLOF.     Baseline  12/20: unable to throw ball, has to alter gym program    Time  4    Period  Weeks    Status  Unable to assess    Target Date  10/13/18      PT LONG TERM GOAL #4   Title  Pt will decrease quick DASH Sport score by at least 8%  to 29.5% in order to demonstrate clinically significant reduction in disability.    Baseline  12/20: 37.5%    Time  4    Period  Weeks    Status  New    Target Date  10/13/18      PT LONG TERM GOAL #5   Title  Patient will increase R shoulder ER in standing actively to neutral position without compensatory patterning for return to proper throwing position.     Baseline  12/20: -10 degrees    Time  4    Period  Weeks    Status  New    Target Date  10/13/18            Plan - 09/19/18 0850    Clinical  Impression Statement  Patient demonstrates understanding of HEP, requires only minor cueing for positioning of body for correct alignment. Pectoral, subscap, and bicep musculature excessively limited in muscle tissue length which improved with manual. Patient compensates with shoulder shrug when fatigued. Pt will benefit from PT services to address deficits in strength, mobility, and pain in order to return to full function at home and to softball with less shoulder pain.    Rehab Potential  Excellent    Clinical Impairments Affecting Rehab Potential  (+) previous level of function, high pain tolerance (-) chronicity of R shoulder activation, concurent cardiac complications    PT Frequency  2x / week    PT Duration  4 weeks    PT Treatment/Interventions  ADLs/Self Care Home Management;Aquatic Therapy;Biofeedback;Cryotherapy;Electrical Stimulation;Iontophoresis 4mg /ml Dexamethasone;Moist Heat;Traction;Ultrasound;Therapeutic exercise;Therapeutic activities;Functional mobility training;Neuromuscular re-education;Patient/family education;Manual techniques;Compression bandaging;Scar mobilization;Passive range of motion;Vasopneumatic Device;Taping;Splinting;Energy conservation;Dry needling    PT Next Visit Plan  supine: GH stabilization, review isometrics, body blade  PT Home Exercise Plan  see above    Consulted and Agree with Plan of Care  Patient       Patient will benefit from skilled therapeutic intervention in order to improve the following deficits and impairments:  Cardiopulmonary status limiting activity, Decreased activity tolerance, Decreased endurance, Decreased range of motion, Decreased strength, Hypermobility, Hypomobility, Increased fascial restricitons, Increased edema, Impaired flexibility, Impaired UE functional use, Postural dysfunction, Improper body mechanics, Pain  Visit Diagnosis: Acute pain of right shoulder  Stiffness of right shoulder, not elsewhere classified  Abnormal  posture  Muscle weakness     Problem List Patient Active Problem List   Diagnosis Date Noted  . History of colonic polyps 02/18/2017  . Thrombosed external hemorrhoid 02/18/2017  . CAD (coronary artery disease) 10/17/2016  . Thoracic aortic atherosclerosis ([place]) 10/17/2016  . Health maintenance examination 08/12/2015  . Advanced care planning/counseling discussion 08/05/2014  . Medicare annual wellness visit, subsequent 08/05/2014  . Prediabetes 08/05/2014  . 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 S/P AVR (aortic valve replacement) and aortoplasty 07/23/2014  . Thoracic ascending aortic aneurysm ([place]) 07/02/2014  . Sarcoidosis 04/26/2014  . Asthmatic bronchitis 04/24/2014  . Pulmonary nodule, left   . Gross hematuria 11/06/2013  . Essential hypertension 08/29/2013  . Aortic insufficiency   . Aortic valve stenosis, severe   . DJD (degenerative joint disease) 02/27/2013  . Knee pain 02/27/2013  . Wedge compression fracture of thoracic vertebra ([place]) 02/27/2013  . TESTICULAR HYPOFUNCTION 09/08/2010  . Recurrent nephrolithiasis 08/23/2009  . 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 Glaucoma 08/22/2008  . HYPERCHOLESTEROLEMIA 12/18/2007  . Obstructive sleep apnea 12/18/2007  . ALLERGIC RHINITIS 12/18/2007   Janna Arch, PT, DPT   09/19/2018, 8:51 AM  [place] MAIN [facility] SERVICES [address] [place], [place], [address] Phone: [phone]   Fax:  [phone]  Name: ANGAD NABERS MRN: [id number] Date of Birth: 07/21/1946

## 2018-09-21 ENCOUNTER — Ambulatory Visit: Payer: Medicare HMO

## 2018-09-21 DIAGNOSIS — R293 Abnormal posture: Secondary | ICD-10-CM | POA: Diagnosis not present

## 2018-09-21 DIAGNOSIS — M6281 Muscle weakness (generalized): Secondary | ICD-10-CM | POA: Diagnosis not present

## 2018-09-21 DIAGNOSIS — M25511 Pain in right shoulder: Secondary | ICD-10-CM

## 2018-09-21 DIAGNOSIS — M25611 Stiffness of right shoulder, not elsewhere classified: Secondary | ICD-10-CM

## 2018-09-21 NOTE — Therapy (Signed)
[place] MAIN [facility] SERVICES [address] [place], [place], [address] Phone: [phone]   Fax:  [phone]  Physical Therapy Treatment  Patient Details  Name: Jeremy Meadows MRN: [id number] Date of Birth: [birth year] Referring Provider (PT): Dr. Vonna Drafts   Encounter Date: 09/21/2018  PT End of Session - 09/21/18 0901    Visit Number  3    Number of Visits  8    Date for PT Re-Evaluation  10/13/18    Authorization Type  3/10 PN start 12/20    PT Start Time  0801    PT Stop Time  0845    PT Time Calculation (min)  44 min    Activity Tolerance  Patient tolerated treatment well    Behavior During Therapy  [facility] for tasks assessed/performed       Past Medical History:  Diagnosis Date  . Allergic rhinitis, cause unspecified   . Aortic insufficiency    s/p AVR 2015  . Aortic root enlargement (HCC)    thoracic aorta  . Aortic valve stenosis 2015   s/p AVR 2015 [name]) - completed cardiac rehab 08/2014  . Arthritis   . Asthma   . Atrial fibrillation ([place]) 07/23/2014  . Benign neoplasm of colon   . Gallstones    by CT  . History of kidney stones   . History of nephrolithiasis   . HLD (hyperlipidemia)   . Hypertension   . Need for prophylactic antibiotic   . Obstructive sleep apnea (adult) (pediatric)    pt. doesn't use his machine at home.  . Other testicular hypofunction   . Pulmonary nodule, left 2015   2cm, ?sarcoid by biopsy  . Unspecified glaucoma(365.9)     Past Surgical History:  Procedure Laterality Date  . AORTIC VALVE REPLACEMENT N/A 07/02/2014   Procedure: AORTIC VALVE REPLACEMENT (AVR) with 23 Aortic Magna Ease;  Surgeon: Grace Isaac, MD  . ASCENDING AORTIC ROOT REPLACEMENT N/A 07/02/2014   Procedure: supra coronary  ASCENDING AORTIC REPLACEMENT to the Inominate Artery with 24mm Hemashield Platinum with  circulatory arrest;  Surgeon: Grace Isaac, MD  . [place]    . CARDIAC  VALVE REPLACEMENT    . CATARACT EXTRACTION Bilateral   . COLONOSCOPY  2013   WNL, rpt 5 yrs [name])  . COLONOSCOPY  2018   1 TA, diverticulosis, rpt 5 yrs [name])  . CORONARY ARTERY BYPASS GRAFT    . EYE SURGERY    . INTRAOPERATIVE TRANSESOPHAGEAL ECHOCARDIOGRAM N/A 07/02/2014  . JOINT REPLACEMENT Right 14   partial replacement  . KNEE SURGERY Right 05/2012   arthroscopic  . KNEE SURGERY Right 12/2013   patella  . LEFT AND RIGHT HEART CATHETERIZATION WITH CORONARY ANGIOGRAM N/A 05/22/2014   no significant obstructive CAD ([name])  . LITHOTRIPSY    . LUNG BIOPSY Left 2015   by IR - benign  . MEDIAL PARTIAL KNEE REPLACEMENT Right 03/2013  . RHINOPLASTY  1980s  . VENTRAL HERNIA REPAIR  08/26/2015  . VENTRAL HERNIA REPAIR N/A 08/26/2015   Procedure: VENTRAL HERNIA REPAIR WITH MESH ADULT;  Surgeon: Grace Isaac, MD;  Location: [place];  Service: Vascular;  Laterality: N/A;    There were no vitals filed for this visit.  Subjective Assessment - 09/21/18 0900    Subjective  Patient compliant with HEP. Was playing toss with a nerf ball with grandchildren and felt pain yesterday.     Pertinent History  Patient is a pleasant 72  year old male for right shoulder that began mid September..  Patient has had intermittent shoulder pain for months after he was lifting weights and felt a pop in his shoulder after an overhead movement. MRI imaging per physician note indicated a significant tear of supraspinatus and infraspinatus tendons as well as atrophy of infraspinatus muscle. Moderate tendinosis of intra articular portion of biceps tendon noted.  Patient regularly sees cardiology for aortic stenosis and thoracic aortic aneurysms s/p pericardial tissue AVR and aneurysm graft (2015). 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 He enjoyed softball and walking 40 minutes 3days/week.  Patient has been recommended to get a cortisone surgery and have physical therapy. Wants to return to softball in  march.     Limitations  Lifting;House hold activities;Other (comment)    How long can you sit comfortably?  n/a    How long can you stand comfortably?  n/a    How long can you walk comfortably?  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  Yes    Pain Score  3     Pain Location  Shoulder    Pain Orientation  Right    Pain Descriptors / Indicators  Aching    Pain Type  Acute pain    Pain Onset  More than a month ago    Pain Frequency  Intermittent       Supine:  GH stabilization with pertubations at 90 90 for 3x30 seconds  ER PROM x10 x 20 second holds ER AAROM x10 x 20 second holds with dowel Robber stretch 30 seconds Pectoral STM/trigger point, subscap STM 4 minutes Bicep STM/trigger point with movement (flexion/extension) x 3 minutes    Standing  Body blade: standing flexion x 8, abduction x 8: stop at 90 degrees; x 2 trials  YTB ER walks 5x; extremely challenging to keep elbow in line with wrist Seated:swiss ball forward flexion rollouts 10x Supine: scapular punches. 10x  K tape placement for scapular retraction and depression. Performed body blade again after with improved biomechanics.                         PT Education - 09/21/18 0901    Education Details  exercise technique, stability, strength     Person(s) Educated  Patient    Methods  Explanation;Demonstration    Comprehension  Verbalized understanding;Returned demonstration;Tactile cues required;Need further instruction       PT Short Term Goals - 09/15/18 0951      PT SHORT TERM GOAL #1   Title  Pt will be independent with HEP in order to improve strength and decrease pain in order to improve pain-free function at home and work.    Baseline  12/20: HEP given     Time  2    Period  Weeks    Status  New    Target Date  10/13/18      PT  SHORT TERM GOAL #2   Title  Pt will decrease worst pain as reported on NPRS  to 3/10  in order to demonstrate clinically significant reduction in pain.    Baseline  12/20: 5/10     Time  2    Period  Weeks    Status  New    Target  Date  10/13/18        PT Long Term Goals - 09/15/18 0951      PT LONG TERM GOAL #1   Title  Pt will decrease worst pain as reported on NPRS to 2/10 in order to demonstrate clinically significant reduction in pain.    Baseline  12/20: 5/10     Time  4    Period  Weeks    Status  New    Target Date  10/13/18      PT LONG TERM GOAL #2   Title  Pt will increase strength of R shoulder to 4+/5 in order to demonstrate improvement in strength and function     Baseline  12/20: R gross 4-/5 with ER 2+/5    Time  4    Period  Weeks    Status  New    Target Date  10/13/18      PT LONG TERM GOAL #3   Title  Patient will return to softball and gym program with no limitations or compensations for return to PLOF.     Baseline  12/20: unable to throw ball, has to alter gym program    Time  4    Period  Weeks    Status  Unable to assess    Target Date  10/13/18      PT LONG TERM GOAL #4   Title  Pt will decrease quick DASH Sport score by at least 8%  to 29.5% in order to demonstrate clinically significant reduction in disability.    Baseline  12/20: 37.5%    Time  4    Period  Weeks    Status  New    Target Date  10/13/18      PT LONG TERM GOAL #5   Title  Patient will increase R shoulder ER in standing actively to neutral position without compensatory patterning for return to proper throwing position.     Baseline  12/20: -10 degrees    Time  4    Period  Weeks    Status  New    Target Date  10/13/18            Plan - 09/21/18 0903    Clinical Impression Statement  Patient's forward head rounded shoulder positioning improved with application of K tape for R scapular retraction and depression. Patient's shoulder shrug is noted upon reaching 90  degrees active ROM with compensatory mechanisms of upper trap activation.Pt will benefit from PT services to address deficits in strength, mobility, and pain in order to return to full function at home and to softball with less shoulder pain.      Rehab Potential  Excellent    Clinical Impairments Affecting Rehab Potential  (+) previous level of function, high pain tolerance (-) chronicity of R shoulder activation, concurent cardiac complications    PT Frequency  2x / week    PT Duration  4 weeks    PT Treatment/Interventions  ADLs/Self Care Home Management;Aquatic Therapy;Biofeedback;Cryotherapy;Electrical Stimulation;Iontophoresis 4mg /ml Dexamethasone;Moist Heat;Traction;Ultrasound;Therapeutic exercise;Therapeutic activities;Functional mobility training;Neuromuscular re-education;Patient/family education;Manual techniques;Compression bandaging;Scar mobilization;Passive range of motion;Vasopneumatic Device;Taping;Splinting;Energy conservation;Dry needling    PT Next Visit Plan  supine: GH stabilization, review isometrics, body blade    PT Home Exercise Plan  see above    Consulted and Agree with Plan of Care  Patient       Patient will benefit from skilled therapeutic intervention in order to improve the following deficits and impairments:  Cardiopulmonary status limiting activity,  Decreased activity tolerance, Decreased endurance, Decreased range of motion, Decreased strength, Hypermobility, Hypomobility, Increased fascial restricitons, Increased edema, Impaired flexibility, Impaired UE functional use, Postural dysfunction, Improper body mechanics, Pain  Visit Diagnosis: Acute pain of right shoulder  Stiffness of right shoulder, not elsewhere classified  Abnormal posture  Muscle weakness     Problem List Patient Active Problem List   Diagnosis Date Noted  . History of colonic polyps 02/18/2017  . Thrombosed external hemorrhoid 02/18/2017  . CAD (coronary artery disease) 10/17/2016   . Thoracic aortic atherosclerosis ([place]) 10/17/2016  . Health maintenance examination 08/12/2015  . Advanced care planning/counseling discussion 08/05/2014  . Medicare annual wellness visit, subsequent 08/05/2014  . Prediabetes 08/05/2014  . 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 S/P AVR (aortic valve replacement) and aortoplasty 07/23/2014  . Thoracic ascending aortic aneurysm ([place]) 07/02/2014  . Sarcoidosis 04/26/2014  . Asthmatic bronchitis 04/24/2014  . Pulmonary nodule, left   . Gross hematuria 11/06/2013  . Essential hypertension 08/29/2013  . Aortic insufficiency   . Aortic valve stenosis, severe   . DJD (degenerative joint disease) 02/27/2013  . Knee pain 02/27/2013  . Wedge compression fracture of thoracic vertebra ([place]) 02/27/2013  . TESTICULAR HYPOFUNCTION 09/08/2010  . Recurrent nephrolithiasis 08/23/2009  . 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 Glaucoma 08/22/2008  . HYPERCHOLESTEROLEMIA 12/18/2007  . Obstructive sleep apnea 12/18/2007  . ALLERGIC RHINITIS 12/18/2007   Janna Arch, PT, DPT   09/21/2018, 9:06 AM  [place] MAIN [facility] SERVICES [address] [place], [place], [address] Phone: [phone]   Fax:  [phone]  Name: TAIT BALISTRERI MRN: [id number] Date of Birth: [birth year]

## 2018-09-26 ENCOUNTER — Ambulatory Visit: Payer: Medicare HMO

## 2018-09-26 DIAGNOSIS — M6281 Muscle weakness (generalized): Secondary | ICD-10-CM

## 2018-09-26 DIAGNOSIS — M25511 Pain in right shoulder: Secondary | ICD-10-CM | POA: Diagnosis not present

## 2018-09-26 DIAGNOSIS — M25611 Stiffness of right shoulder, not elsewhere classified: Secondary | ICD-10-CM

## 2018-09-26 DIAGNOSIS — R293 Abnormal posture: Secondary | ICD-10-CM | POA: Diagnosis not present

## 2018-09-26 NOTE — Therapy (Signed)
[place] MAIN [facility] SERVICES [address] [place], [place], [address] Phone: [phone]   Fax:  [phone]  Physical Therapy Treatment  Patient Details  Name: Jeremy Meadows MRN: [id number] Date of Birth: July 11, 1946 Referring Provider (PT): Dr. Vonna Drafts   Encounter Date: 09/26/2018  PT End of Session - 09/26/18 1010    Visit Number  4    Number of Visits  8    Date for PT Re-Evaluation  10/13/18    Authorization Type  4/10 PN start 12/20    PT Start Time  0932    PT Stop Time  1012    PT Time Calculation (min)  40 min    Activity Tolerance  Patient tolerated treatment well    Behavior During Therapy  [facility] for tasks assessed/performed       Past Medical History:  Diagnosis Date  . Allergic rhinitis, cause unspecified   . Aortic insufficiency    s/p AVR 2015  . Aortic root enlargement (HCC)    thoracic aorta  . Aortic valve stenosis 2015   s/p AVR 2015 [name]) - completed cardiac rehab 08/2014  . Arthritis   . Asthma   . Atrial fibrillation ([place]) 07/23/2014  . Benign neoplasm of colon   . Gallstones    by CT  . History of kidney stones   . History of nephrolithiasis   . HLD (hyperlipidemia)   . Hypertension   . Need for prophylactic antibiotic   . Obstructive sleep apnea (adult) (pediatric)    pt. doesn't use his machine at home.  . Other testicular hypofunction   . Pulmonary nodule, left 2015   2cm, ?sarcoid by biopsy  . Unspecified glaucoma(365.9)     Past Surgical History:  Procedure Laterality Date  . AORTIC VALVE REPLACEMENT N/A 07/02/2014   Procedure: AORTIC VALVE REPLACEMENT (AVR) with 23 Aortic Magna Ease;  Surgeon: Grace Isaac, MD  . ASCENDING AORTIC ROOT REPLACEMENT N/A 07/02/2014   Procedure: supra coronary  ASCENDING AORTIC REPLACEMENT to the Inominate Artery with 75mm Hemashield Platinum with  circulatory arrest;  Surgeon: Grace Isaac, MD  . [place]    . CARDIAC  VALVE REPLACEMENT    . CATARACT EXTRACTION Bilateral   . COLONOSCOPY  2013   WNL, rpt 5 yrs [name])  . COLONOSCOPY  2018   1 TA, diverticulosis, rpt 5 yrs [name])  . CORONARY ARTERY BYPASS GRAFT    . EYE SURGERY    . INTRAOPERATIVE TRANSESOPHAGEAL ECHOCARDIOGRAM N/A 07/02/2014  . JOINT REPLACEMENT Right 14   partial replacement  . KNEE SURGERY Right 05/2012   arthroscopic  . KNEE SURGERY Right 12/2013   patella  . LEFT AND RIGHT HEART CATHETERIZATION WITH CORONARY ANGIOGRAM N/A 05/22/2014   no significant obstructive CAD ([name])  . LITHOTRIPSY    . LUNG BIOPSY Left 2015   by IR - benign  . MEDIAL PARTIAL KNEE REPLACEMENT Right 03/2013  . RHINOPLASTY  1980s  . VENTRAL HERNIA REPAIR  08/26/2015  . VENTRAL HERNIA REPAIR N/A 08/26/2015   Procedure: VENTRAL HERNIA REPAIR WITH MESH ADULT;  Surgeon: Grace Isaac, MD;  Location: [place];  Service: Vascular;  Laterality: N/A;    There were no vitals filed for this visit.  Subjective Assessment - 09/26/18 0933    Subjective  Pt reported that he just finished working out. No pain/soreness to report.    Pertinent History  Patient is a pleasant 72 year old male for right shoulder  that began mid September..  Patient has had intermittent shoulder pain for months after he was lifting weights and felt a pop in his shoulder after an overhead movement. MRI imaging per physician note indicated a significant tear of supraspinatus and infraspinatus tendons as well as atrophy of infraspinatus muscle. Moderate tendinosis of intra articular portion of biceps tendon noted.  Patient regularly sees cardiology for aortic stenosis and thoracic aortic aneurysms s/p pericardial tissue AVR and aneurysm graft (2015). 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 He enjoyed softball and walking 40 minutes 3days/week.  Patient has been recommended to get a cortisone surgery and have physical therapy. Wants to return to softball in march.     Limitations   Lifting;House hold activities;Other (comment)    How long can you sit comfortably?  n/a    How long can you stand comfortably?  n/a    How long can you walk comfortably?  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  No/denies         Pt described his gym routine prior to PT: overhead pulls low rows, lat pulldowns, some abdominal work  Treatment: Stabilization in ER with RTB 3x in supine, PT pulling on theraband for rhythmic stabilizaiton Stabilization in shoulder flexion with retraction 3x with RTB, Pt pulling on band for rhythmic stabilization ER stretch in 90/90 with half foam roll under spine, with dowel pause at top x30 Serratus punches 2x10 with verbal/tactile cues Y wall slides and lift 2x15, pt with mild complaints of fatigue Scapular wall slides with YTB x10, x12 without YTB (unable to maintain form with band, moderate improvement without resistance)  Standing  Body blade: standing flexion x 30", abduction x 30": elbow bent x30 sec ea position Standing, moving through flexion and abduction to 90deg x10 ea    PT Education - 09/26/18 1211    Education Details  exercise technique and form    Person(s) Educated  Patient    Methods  Explanation;Demonstration    Comprehension  Verbalized understanding;Returned demonstration;Verbal cues required       PT Short Term Goals - 09/15/18 0951      PT SHORT TERM GOAL #1   Title  Pt will be independent with HEP in order to improve strength and decrease pain in order to improve pain-free function at home and work.    Baseline  12/20: HEP given     Time  2    Period  Weeks    Status  New    Target Date  10/13/18      PT SHORT TERM GOAL #2   Title  Pt will decrease worst pain as reported on NPRS  to 3/10  in order to demonstrate clinically significant reduction in pain.    Baseline   12/20: 5/10     Time  2    Period  Weeks    Status  New    Target Date  10/13/18        PT Long Term Goals - 09/15/18 0951      PT LONG TERM GOAL #1   Title  Pt will decrease worst pain as reported on NPRS to 2/10 in order to demonstrate clinically significant reduction in pain.    Baseline  12/20: 5/10     Time  4  Period  Weeks    Status  New    Target Date  10/13/18      PT LONG TERM GOAL #2   Title  Pt will increase strength of R shoulder to 4+/5 in order to demonstrate improvement in strength and function     Baseline  12/20: R gross 4-/5 with ER 2+/5    Time  4    Period  Weeks    Status  New    Target Date  10/13/18      PT LONG TERM GOAL #3   Title  Patient will return to softball and gym program with no limitations or compensations for return to PLOF.     Baseline  12/20: unable to throw ball, has to alter gym program    Time  4    Period  Weeks    Status  Unable to assess    Target Date  10/13/18      PT LONG TERM GOAL #4   Title  Pt will decrease quick DASH Sport score by at least 8%  to 29.5% in order to demonstrate clinically significant reduction in disability.    Baseline  12/20: 37.5%    Time  4    Period  Weeks    Status  New    Target Date  10/13/18      PT LONG TERM GOAL #5   Title  Patient will increase R shoulder ER in standing actively to neutral position without compensatory patterning for return to proper throwing position.     Baseline  12/20: -10 degrees    Time  4    Period  Weeks    Status  New    Target Date  10/13/18            Plan - 09/26/18 1211    Rehab Potential  Excellent    Clinical Impairments Affecting Rehab Potential  (+) previous level of function, high pain tolerance (-) chronicity of R shoulder activation, concurent cardiac complications    PT Frequency  2x / week    PT Duration  4 weeks    PT Treatment/Interventions  ADLs/Self Care Home Management;Aquatic Therapy;Biofeedback;Cryotherapy;Electrical  Stimulation;Iontophoresis 4mg /ml Dexamethasone;Moist Heat;Traction;Ultrasound;Therapeutic exercise;Therapeutic activities;Functional mobility training;Neuromuscular re-education;Patient/family education;Manual techniques;Compression bandaging;Scar mobilization;Passive range of motion;Vasopneumatic Device;Taping;Splinting;Energy conservation;Dry needling    PT Next Visit Plan  supine: GH stabilization, review isometrics, body blade    PT Home Exercise Plan  see above    Consulted and Agree with Plan of Care  Patient       Patient will benefit from skilled therapeutic intervention in order to improve the following deficits and impairments:  Cardiopulmonary status limiting activity, Decreased activity tolerance, Decreased endurance, Decreased range of motion, Decreased strength, Hypermobility, Hypomobility, Increased fascial restricitons, Increased edema, Impaired flexibility, Impaired UE functional use, Postural dysfunction, Improper body mechanics, Pain  Visit Diagnosis: Acute pain of right shoulder  Stiffness of right shoulder, not elsewhere classified  Muscle weakness  Abnormal posture     Problem List Patient Active Problem List   Diagnosis Date Noted  . History of colonic polyps 02/18/2017  . Thrombosed external hemorrhoid 02/18/2017  . CAD (coronary artery disease) 10/17/2016  . Thoracic aortic atherosclerosis ([place]) 10/17/2016  . Health maintenance examination 08/12/2015  . Advanced care planning/counseling discussion 08/05/2014  . Medicare annual wellness visit, subsequent 08/05/2014  . Prediabetes 08/05/2014  . 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 S/P AVR (aortic valve replacement) and aortoplasty 07/23/2014  . Thoracic ascending aortic aneurysm (  [place]) 07/02/2014  . Sarcoidosis 04/26/2014  . Asthmatic bronchitis 04/24/2014  . Pulmonary nodule, left   . Gross hematuria 11/06/2013  . Essential hypertension 08/29/2013  . Aortic insufficiency   . Aortic valve  stenosis, severe   . DJD (degenerative joint disease) 02/27/2013  . Knee pain 02/27/2013  . Wedge compression fracture of thoracic vertebra ([place]) 02/27/2013  . TESTICULAR HYPOFUNCTION 09/08/2010  . Recurrent nephrolithiasis 08/23/2009  . 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 Glaucoma 08/22/2008  . HYPERCHOLESTEROLEMIA 12/18/2007  . Obstructive sleep apnea 12/18/2007  . ALLERGIC RHINITIS 12/18/2007    Lieutenant Diego PT, DPT 12:23 PM,09/26/18 [phone]  [place] MAIN [facility] SERVICES [address] [place], [place], [address] Phone: [phone]   Fax:  [phone]  Name: Jeremy Meadows MRN: [id number] Date of Birth: 1946-02-12

## 2018-09-28 ENCOUNTER — Ambulatory Visit: Payer: Medicare HMO | Attending: Orthopedic Surgery | Admitting: Physical Therapy

## 2018-09-28 ENCOUNTER — Encounter: Payer: Self-pay | Admitting: Physical Therapy

## 2018-09-28 DIAGNOSIS — R293 Abnormal posture: Secondary | ICD-10-CM | POA: Diagnosis not present

## 2018-09-28 DIAGNOSIS — M25511 Pain in right shoulder: Secondary | ICD-10-CM | POA: Diagnosis not present

## 2018-09-28 DIAGNOSIS — M6281 Muscle weakness (generalized): Secondary | ICD-10-CM | POA: Insufficient documentation

## 2018-09-28 DIAGNOSIS — M25611 Stiffness of right shoulder, not elsewhere classified: Secondary | ICD-10-CM | POA: Diagnosis not present

## 2018-09-28 NOTE — Therapy (Signed)
[place] MAIN [facility] SERVICES [address] [place], [place], [address] Phone: [phone]   Fax:  [phone]  Physical Therapy Treatment  Patient Details  Name: Jeremy Meadows MRN: [id number] Date of Birth: March 09, 1946 Referring Provider (PT): Dr. Vonna Drafts   Encounter Date: 09/28/2018  PT End of Session - 09/28/18 0930    Visit Number  5    Number of Visits  8    Date for PT Re-Evaluation  10/13/18    Authorization Type  5/10 PN start 12/20    PT Start Time  0931    PT Stop Time  1016    PT Time Calculation (min)  45 min    Activity Tolerance  Patient tolerated treatment well    Behavior During Therapy  [facility] for tasks assessed/performed       Past Medical History:  Diagnosis Date  . Allergic rhinitis, cause unspecified   . Aortic insufficiency    s/p AVR 2015  . Aortic root enlargement (HCC)    thoracic aorta  . Aortic valve stenosis 2015   s/p AVR 2015 [name]) - completed cardiac rehab 08/2014  . Arthritis   . Asthma   . Atrial fibrillation ([place]) 07/23/2014  . Benign neoplasm of colon   . Gallstones    by CT  . History of kidney stones   . History of nephrolithiasis   . HLD (hyperlipidemia)   . Hypertension   . Need for prophylactic antibiotic   . Obstructive sleep apnea (adult) (pediatric)    pt. doesn't use his machine at home.  . Other testicular hypofunction   . Pulmonary nodule, left 2015   2cm, ?sarcoid by biopsy  . Unspecified glaucoma(365.9)     Past Surgical History:  Procedure Laterality Date  . AORTIC VALVE REPLACEMENT N/A 07/02/2014   Procedure: AORTIC VALVE REPLACEMENT (AVR) with 23 Aortic Magna Ease;  Surgeon: Grace Isaac, MD  . ASCENDING AORTIC ROOT REPLACEMENT N/A 07/02/2014   Procedure: supra coronary  ASCENDING AORTIC REPLACEMENT to the Inominate Artery with 31mm Hemashield Platinum with  circulatory arrest;  Surgeon: Grace Isaac, MD  . [place]    . CARDIAC VALVE  REPLACEMENT    . CATARACT EXTRACTION Bilateral   . COLONOSCOPY  2013   WNL, rpt 5 yrs [name])  . COLONOSCOPY  2018   1 TA, diverticulosis, rpt 5 yrs [name])  . CORONARY ARTERY BYPASS GRAFT    . EYE SURGERY    . INTRAOPERATIVE TRANSESOPHAGEAL ECHOCARDIOGRAM N/A 07/02/2014  . JOINT REPLACEMENT Right 14   partial replacement  . KNEE SURGERY Right 05/2012   arthroscopic  . KNEE SURGERY Right 12/2013   patella  . LEFT AND RIGHT HEART CATHETERIZATION WITH CORONARY ANGIOGRAM N/A 05/22/2014   no significant obstructive CAD ([name])  . LITHOTRIPSY    . LUNG BIOPSY Left 2015   by IR - benign  . MEDIAL PARTIAL KNEE REPLACEMENT Right 03/2013  . RHINOPLASTY  1980s  . VENTRAL HERNIA REPAIR  08/26/2015  . VENTRAL HERNIA REPAIR N/A 08/26/2015   Procedure: VENTRAL HERNIA REPAIR WITH MESH ADULT;  Surgeon: Grace Isaac, MD;  Location: [place];  Service: Vascular;  Laterality: N/A;    There were no vitals filed for this visit.  Subjective Assessment - 09/28/18 1021    Subjective  Patient states that he noticed a little soreness after his workout this morning, but denies any pain. He says "it feels like I played a lot yesterday."  Pertinent History  Patient is a pleasant 73 year old male for right shoulder that began mid September..  Patient has had intermittent shoulder pain for months after he was lifting weights and felt a pop in his shoulder after an overhead movement. MRI imaging per physician note indicated a significant tear of supraspinatus and infraspinatus tendons as well as atrophy of infraspinatus muscle. Moderate tendinosis of intra articular portion of biceps tendon noted.  Patient regularly sees cardiology for aortic stenosis and thoracic aortic aneurysms s/p pericardial tissue AVR and aneurysm graft (2015). 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 He enjoyed softball and walking 40 minutes 3days/week.  Patient has been recommended to get a cortisone surgery and have physical  therapy. Wants to return to softball in march.     Limitations  Lifting;House hold activities;Other (comment)    How long can you sit comfortably?  n/a    How long can you stand comfortably?  n/a    How long can you walk comfortably?  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  No/denies      Treatment: Stabilization in ER with RTB 3x in supine, PT pulling on theraband for rhythmic stabilizaiton Stabilization in shoulder flexion with retraction 3x with RTB, Pt pulling on band for rhythmic stabilization RUE, ER stretch in 90/90 with half foam roll under spine, with dowel pause at top x20 RUE, ER stretch into abducted position to mimic throwing position, in supine, with dowel x15 RUE, Serratus punches 1x10, 1x15 w/ 3lb db with verbal/tactile cues  BUE, Seated Scapular protraction/retraction w/ PVC and GTB, x15 with 5 sec hold retraction RUE, Y wall lifts 1x15, pt with mild complaints of fatigue BUE, Scapular wall slides with x15 (unable to maintain form with band, moderate improvement without resistance) RUE, Wall ball 90/90 1Kg ball,  2x30 RUE, Wall ball 120 flexion/90 ER 1 kg ball 2x30 RUE, [place]one ball toss/catch 90/90 1 kg ball x30 RUE, Prone ball toss/catch 120 flexion/90 ER 1 kg ball x15   Standing  Body blade: standing flexion x30" Standing, moving through flexion and abduction to 90deg x10 ea     PT Education - 09/28/18 1022    Education Details  exercise technique, scapular positioning    Person(s) Educated  Patient    Methods  Explanation;Demonstration;Tactile cues;Verbal cues    Comprehension  Verbalized understanding;Returned demonstration;Need further instruction       PT Short Term Goals - 09/15/18 0951      PT SHORT TERM GOAL #1   Title  Pt will be independent with HEP in order to improve strength and decrease pain in  order to improve pain-free function at home and work.    Baseline  12/20: HEP given     Time  2    Period  Weeks    Status  New    Target Date  10/13/18      PT SHORT TERM GOAL #2   Title  Pt will decrease worst pain as reported on NPRS  to 3/10  in order to demonstrate clinically significant reduction in pain.    Baseline  12/20: 5/10     Time  2    Period  Weeks    Status  New    Target Date  10/13/18  PT Long Term Goals - 09/15/18 0951      PT LONG TERM GOAL #1   Title  Pt will decrease worst pain as reported on NPRS to 2/10 in order to demonstrate clinically significant reduction in pain.    Baseline  12/20: 5/10     Time  4    Period  Weeks    Status  New    Target Date  10/13/18      PT LONG TERM GOAL #2   Title  Pt will increase strength of R shoulder to 4+/5 in order to demonstrate improvement in strength and function     Baseline  12/20: R gross 4-/5 with ER 2+/5    Time  4    Period  Weeks    Status  New    Target Date  10/13/18      PT LONG TERM GOAL #3   Title  Patient will return to softball and gym program with no limitations or compensations for return to PLOF.     Baseline  12/20: unable to throw ball, has to alter gym program    Time  4    Period  Weeks    Status  Unable to assess    Target Date  10/13/18      PT LONG TERM GOAL #4   Title  Pt will decrease quick DASH Sport score by at least 8%  to 29.5% in order to demonstrate clinically significant reduction in disability.    Baseline  12/20: 37.5%    Time  4    Period  Weeks    Status  New    Target Date  10/13/18      PT LONG TERM GOAL #5   Title  Patient will increase R shoulder ER in standing actively to neutral position without compensatory patterning for return to proper throwing position.     Baseline  12/20: -10 degrees    Time  4    Period  Weeks    Status  New    Target Date  10/13/18            Plan - 09/28/18 1229    Clinical Impression Statement  Patient presents  with excellent motivation. Patient continues to demonstrate improved pain free ROM and compliance with HEP. Today's session focused on proprioceptive and motor control proximally at the scapula and shoulder for improved tolerance to throwing activities related to sport. Patient continues to have limitations in scapular control at end range shoulder flexion and ER, which will benefit from further progression of exercises. Patient will benefit from continued skilled therapeutic intervention in order to address remaining deficits and ensure a safe return to sport.     Rehab Potential  Excellent    Clinical Impairments Affecting Rehab Potential  (+) previous level of function, high pain tolerance (-) chronicity of R shoulder activation, concurent cardiac complications    PT Frequency  2x / week    PT Duration  4 weeks    PT Treatment/Interventions  ADLs/Self Care Home Management;Aquatic Therapy;Biofeedback;Cryotherapy;Electrical Stimulation;Iontophoresis 4mg /ml Dexamethasone;Moist Heat;Traction;Ultrasound;Therapeutic exercise;Therapeutic activities;Functional mobility training;Neuromuscular re-education;Patient/family education;Manual techniques;Compression bandaging;Scar mobilization;Passive range of motion;Vasopneumatic Device;Taping;Splinting;Energy conservation;Dry needling    PT Next Visit Plan  progress stabilization in sport specific patterns/positions    PT Home Exercise Plan  see above    Consulted and Agree with Plan of Care  Patient       Patient will benefit from skilled therapeutic intervention in order to improve the following deficits  and impairments:  Cardiopulmonary status limiting activity, Decreased activity tolerance, Decreased endurance, Decreased range of motion, Decreased strength, Hypermobility, Hypomobility, Increased fascial restricitons, Increased edema, Impaired flexibility, Impaired UE functional use, Postural dysfunction, Improper body mechanics, Pain  Visit Diagnosis: Acute  pain of right shoulder  Stiffness of right shoulder, not elsewhere classified  Muscle weakness  Abnormal posture     Problem List Patient Active Problem List   Diagnosis Date Noted  . History of colonic polyps 02/18/2017  . Thrombosed external hemorrhoid 02/18/2017  . CAD (coronary artery disease) 10/17/2016  . Thoracic aortic atherosclerosis ([place]) 10/17/2016  . Health maintenance examination 08/12/2015  . Advanced care planning/counseling discussion 08/05/2014  . Medicare annual wellness visit, subsequent 08/05/2014  . Prediabetes 08/05/2014  . 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 S/P AVR (aortic valve replacement) and aortoplasty 07/23/2014  . Thoracic ascending aortic aneurysm ([place]) 07/02/2014  . Sarcoidosis 04/26/2014  . Asthmatic bronchitis 04/24/2014  . Pulmonary nodule, left   . Gross hematuria 11/06/2013  . Essential hypertension 08/29/2013  . Aortic insufficiency   . Aortic valve stenosis, severe   . DJD (degenerative joint disease) 02/27/2013  . Knee pain 02/27/2013  . Wedge compression fracture of thoracic vertebra ([place]) 02/27/2013  . TESTICULAR HYPOFUNCTION 09/08/2010  . Recurrent nephrolithiasis 08/23/2009  . 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 Glaucoma 08/22/2008  . HYPERCHOLESTEROLEMIA 12/18/2007  . Obstructive sleep apnea 12/18/2007  . ALLERGIC RHINITIS 12/18/2007   Myles Gip PT, DPT [phone] 09/28/2018, 12:39 PM  [place] MAIN [facility] SERVICES [address] [place], [place], [address] Phone: [phone]   Fax:  [phone]  Name: Jeremy Meadows MRN: [id number] Date of Birth: 1946/01/11

## 2018-10-05 ENCOUNTER — Ambulatory Visit: Payer: Medicare HMO

## 2018-10-05 DIAGNOSIS — M6281 Muscle weakness (generalized): Secondary | ICD-10-CM

## 2018-10-05 DIAGNOSIS — M25611 Stiffness of right shoulder, not elsewhere classified: Secondary | ICD-10-CM

## 2018-10-05 DIAGNOSIS — R293 Abnormal posture: Secondary | ICD-10-CM | POA: Diagnosis not present

## 2018-10-05 DIAGNOSIS — M25511 Pain in right shoulder: Secondary | ICD-10-CM

## 2018-10-05 NOTE — Therapy (Signed)
[place] MAIN [facility] SERVICES [address] [place], [place], [address] Phone: [phone]   Fax:  [phone]  Physical Therapy Treatment  Patient Details  Name: Jeremy Meadows MRN: [id number] Date of Birth: 01-05-1946 Referring Provider (PT): Dr. Vonna Drafts   Encounter Date: 10/05/2018  PT End of Session - 10/05/18 0759    Visit Number  6    Number of Visits  8    Date for PT Re-Evaluation  10/13/18    Authorization Type  6/10 PN start 12/20    PT Start Time  0759    PT Stop Time  0844    PT Time Calculation (min)  45 min    Activity Tolerance  Patient tolerated treatment well    Behavior During Therapy  [facility] for tasks assessed/performed       Past Medical History:  Diagnosis Date  . Allergic rhinitis, cause unspecified   . Aortic insufficiency    s/p AVR 2015  . Aortic root enlargement (HCC)    thoracic aorta  . Aortic valve stenosis 2015   s/p AVR 2015 [name]) - completed cardiac rehab 08/2014  . Arthritis   . Asthma   . Atrial fibrillation ([place]) 07/23/2014  . Benign neoplasm of colon   . Gallstones    by CT  . History of kidney stones   . History of nephrolithiasis   . HLD (hyperlipidemia)   . Hypertension   . Need for prophylactic antibiotic   . Obstructive sleep apnea (adult) (pediatric)    pt. doesn't use his machine at home.  . Other testicular hypofunction   . Pulmonary nodule, left 2015   2cm, ?sarcoid by biopsy  . Unspecified glaucoma(365.9)     Past Surgical History:  Procedure Laterality Date  . AORTIC VALVE REPLACEMENT N/A 07/02/2014   Procedure: AORTIC VALVE REPLACEMENT (AVR) with 23 Aortic Magna Ease;  Surgeon: Grace Isaac, MD  . ASCENDING AORTIC ROOT REPLACEMENT N/A 07/02/2014   Procedure: supra coronary  ASCENDING AORTIC REPLACEMENT to the Inominate Artery with 44mm Hemashield Platinum with  circulatory arrest;  Surgeon: Grace Isaac, MD  . [place]    . CARDIAC VALVE  REPLACEMENT    . CATARACT EXTRACTION Bilateral   . COLONOSCOPY  2013   WNL, rpt 5 yrs [name])  . COLONOSCOPY  2018   1 TA, diverticulosis, rpt 5 yrs [name])  . CORONARY ARTERY BYPASS GRAFT    . EYE SURGERY    . INTRAOPERATIVE TRANSESOPHAGEAL ECHOCARDIOGRAM N/A 07/02/2014  . JOINT REPLACEMENT Right 14   partial replacement  . KNEE SURGERY Right 05/2012   arthroscopic  . KNEE SURGERY Right 12/2013   patella  . LEFT AND RIGHT HEART CATHETERIZATION WITH CORONARY ANGIOGRAM N/A 05/22/2014   no significant obstructive CAD ([name])  . LITHOTRIPSY    . LUNG BIOPSY Left 2015   by IR - benign  . MEDIAL PARTIAL KNEE REPLACEMENT Right 03/2013  . RHINOPLASTY  1980s  . VENTRAL HERNIA REPAIR  08/26/2015  . VENTRAL HERNIA REPAIR N/A 08/26/2015   Procedure: VENTRAL HERNIA REPAIR WITH MESH ADULT;  Surgeon: Grace Isaac, MD;  Location: [place];  Service: Vascular;  Laterality: N/A;    There were no vitals filed for this visit.  Subjective Assessment - 10/05/18 1221    Subjective  Patient reports that he is noticing he's getting a little better but still is limited in his throwing motion. continues to be compliant with HEP.  Pertinent History  Patient is a pleasant 73 year old male for right shoulder that began mid September..  Patient has had intermittent shoulder pain for months after he was lifting weights and felt a pop in his shoulder after an overhead movement. MRI imaging per physician note indicated a significant tear of supraspinatus and infraspinatus tendons as well as atrophy of infraspinatus muscle. Moderate tendinosis of intra articular portion of biceps tendon noted.  Patient regularly sees cardiology for aortic stenosis and thoracic aortic aneurysms s/p pericardial tissue AVR and aneurysm graft (2015). 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 He enjoyed softball and walking 40 minutes 3days/week.  Patient has been recommended to get a cortisone surgery and have physical therapy.  Wants to return to softball in march.     Limitations  Lifting;House hold activities;Other (comment)    How long can you sit comfortably?  n/a    How long can you stand comfortably?  n/a    How long can you walk comfortably?  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  No/denies       Treatment: Supine:  Stabilization in ER with RTB 3x in supine, PT pulling on theraband for rhythmic stabilization Stabilization in shoulder flexion with retraction 3x with RTB, Pt pulling on band for rhythmic stabilization RUE, ER stretch in 90/90 with half foam roll under spine, with dowel pause at top x20 RUE, ER stretch into abducted position to mimic throwing position, in supine, with dowel x15 RUE, Serratus punches 1x10, 1x15 w/ 3lb db with verbal/tactile cues  Pectoral STM/trigger point, subscap STM 4 minutes Bicep STM/trigger point with movement (flexion/extension) x 3 minutes  Distraction of Gh with horizontal adduction 10x   Quadruped: Scapular depression and elevation: cat cow for scapula x 10 ; 2 sets  Seated YTB ER 10x  Thoracic extension over towel roll on seat 10x for postural correction  Standing:   Body blade: standing flexion x 30", abduction x 30": static hold in front x 30 seconds, lateral movement : windshield wiper x 30 seconds, D2 pattern 30 seconds  K tape placement for scapular retraction and depression. Patient educated and verbalized understanding for wear time.                           PT Education - 10/05/18 0759    Education Details  exercise technique, posture     Person(s) Educated  Patient    Methods  Explanation;Demonstration;Tactile cues;Verbal cues    Comprehension  Verbalized understanding;Returned demonstration;Verbal cues required;Tactile cues required;Need further instruction       PT  Short Term Goals - 09/15/18 0951      PT SHORT TERM GOAL #1   Title  Pt will be independent with HEP in order to improve strength and decrease pain in order to improve pain-free function at home and work.    Baseline  12/20: HEP given     Time  2    Period  Weeks    Status  New    Target Date  10/13/18      PT SHORT TERM GOAL #2   Title  Pt will decrease worst pain as reported on NPRS  to 3/10  in order to demonstrate clinically significant reduction in pain.  Baseline  12/20: 5/10     Time  2    Period  Weeks    Status  New    Target Date  10/13/18        PT Long Term Goals - 09/15/18 0951      PT LONG TERM GOAL #1   Title  Pt will decrease worst pain as reported on NPRS to 2/10 in order to demonstrate clinically significant reduction in pain.    Baseline  12/20: 5/10     Time  4    Period  Weeks    Status  New    Target Date  10/13/18      PT LONG TERM GOAL #2   Title  Pt will increase strength of R shoulder to 4+/5 in order to demonstrate improvement in strength and function     Baseline  12/20: R gross 4-/5 with ER 2+/5    Time  4    Period  Weeks    Status  New    Target Date  10/13/18      PT LONG TERM GOAL #3   Title  Patient will return to softball and gym program with no limitations or compensations for return to PLOF.     Baseline  12/20: unable to throw ball, has to alter gym program    Time  4    Period  Weeks    Status  Unable to assess    Target Date  10/13/18      PT LONG TERM GOAL #4   Title  Pt will decrease quick DASH Sport score by at least 8%  to 29.5% in order to demonstrate clinically significant reduction in disability.    Baseline  12/20: 37.5%    Time  4    Period  Weeks    Status  New    Target Date  10/13/18      PT LONG TERM GOAL #5   Title  Patient will increase R shoulder ER in standing actively to neutral position without compensatory patterning for return to proper throwing position.     Baseline  12/20: -10 degrees    Time   4    Period  Weeks    Status  New    Target Date  10/13/18            Plan - 10/05/18 1225    Clinical Impression Statement  Patient presents with continued excellent motivation and compliance. Stablization of glenohumeral joint continues to challenge patient at this time with progression to quadruped tolerated for short durations. Patient continues to have limitation in scapular control, as well as external rotation. Pt will benefit from PT services to address deficits in strength, mobility, and pain in order to return to full function at home and to softball with less shoulder pain.    Rehab Potential  Excellent    Clinical Impairments Affecting Rehab Potential  (+) previous level of function, high pain tolerance (-) chronicity of R shoulder activation, concurent cardiac complications    PT Frequency  2x / week    PT Duration  4 weeks    PT Treatment/Interventions  ADLs/Self Care Home Management;Aquatic Therapy;Biofeedback;Cryotherapy;Electrical Stimulation;Iontophoresis 4mg /ml Dexamethasone;Moist Heat;Traction;Ultrasound;Therapeutic exercise;Therapeutic activities;Functional mobility training;Neuromuscular re-education;Patient/family education;Manual techniques;Compression bandaging;Scar mobilization;Passive range of motion;Vasopneumatic Device;Taping;Splinting;Energy conservation;Dry needling    PT Next Visit Plan  progress stabilization in sport specific patterns/positions    PT Home Exercise Plan  see above    Consulted and Agree with Plan of Care  Patient       Patient will benefit from skilled therapeutic intervention in order to improve the following deficits and impairments:  Cardiopulmonary status limiting activity, Decreased activity tolerance, Decreased endurance, Decreased range of motion, Decreased strength, Hypermobility, Hypomobility, Increased fascial restricitons, Increased edema, Impaired flexibility, Impaired UE functional use, Postural dysfunction, Improper body  mechanics, Pain  Visit Diagnosis: Acute pain of right shoulder  Stiffness of right shoulder, not elsewhere classified  Muscle weakness  Abnormal posture     Problem List Patient Active Problem List   Diagnosis Date Noted  . History of colonic polyps 02/18/2017  . Thrombosed external hemorrhoid 02/18/2017  . CAD (coronary artery disease) 10/17/2016  . Thoracic aortic atherosclerosis ([place]) 10/17/2016  . Health maintenance examination 08/12/2015  . Advanced care planning/counseling discussion 08/05/2014  . Medicare annual wellness visit, subsequent 08/05/2014  . Prediabetes 08/05/2014  . 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 S/P AVR (aortic valve replacement) and aortoplasty 07/23/2014  . Thoracic ascending aortic aneurysm ([place]) 07/02/2014  . Sarcoidosis 04/26/2014  . Asthmatic bronchitis 04/24/2014  . Pulmonary nodule, left   . Gross hematuria 11/06/2013  . Essential hypertension 08/29/2013  . Aortic insufficiency   . Aortic valve stenosis, severe   . DJD (degenerative joint disease) 02/27/2013  . Knee pain 02/27/2013  . Wedge compression fracture of thoracic vertebra ([place]) 02/27/2013  . TESTICULAR HYPOFUNCTION 09/08/2010  . Recurrent nephrolithiasis 08/23/2009  . 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 Glaucoma 08/22/2008  . HYPERCHOLESTEROLEMIA 12/18/2007  . Obstructive sleep apnea 12/18/2007  . ALLERGIC RHINITIS 12/18/2007   Janna Arch, PT, DPT   10/05/2018, 12:26 PM  [place] MAIN [facility] SERVICES [address] [place], [place], [address] Phone: [phone]   Fax:  [phone]  Name: NGHIA MCENTEE MRN: [id number] Date of Birth: 12/19/1945

## 2018-10-09 ENCOUNTER — Ambulatory Visit: Payer: Medicare HMO

## 2018-10-09 DIAGNOSIS — R293 Abnormal posture: Secondary | ICD-10-CM | POA: Diagnosis not present

## 2018-10-09 DIAGNOSIS — M6281 Muscle weakness (generalized): Secondary | ICD-10-CM | POA: Diagnosis not present

## 2018-10-09 DIAGNOSIS — M25511 Pain in right shoulder: Secondary | ICD-10-CM | POA: Diagnosis not present

## 2018-10-09 DIAGNOSIS — M25611 Stiffness of right shoulder, not elsewhere classified: Secondary | ICD-10-CM | POA: Diagnosis not present

## 2018-10-09 NOTE — Therapy (Signed)
[place] MAIN [facility] SERVICES [address] [place], [place], [address] Phone: [phone]   Fax:  [phone]  Physical Therapy Treatment  Patient Details  Name: Jeremy Meadows MRN: [id number] Date of Birth: Jan 10, 1946 Referring Provider (PT): Dr. Vonna Drafts   Encounter Date: 10/09/2018  PT End of Session - 10/09/18 0804    Visit Number  7    Number of Visits  8    Date for PT Re-Evaluation  10/13/18    Authorization Type  7/10 PN start 12/20    PT Start Time  0759    PT Stop Time  0844    PT Time Calculation (min)  45 min    Activity Tolerance  Patient tolerated treatment well    Behavior During Therapy  [facility] for tasks assessed/performed       Past Medical History:  Diagnosis Date  . Allergic rhinitis, cause unspecified   . Aortic insufficiency    s/p AVR 2015  . Aortic root enlargement (HCC)    thoracic aorta  . Aortic valve stenosis 2015   s/p AVR 2015 [name]) - completed cardiac rehab 08/2014  . Arthritis   . Asthma   . Atrial fibrillation ([place]) 07/23/2014  . Benign neoplasm of colon   . Gallstones    by CT  . History of kidney stones   . History of nephrolithiasis   . HLD (hyperlipidemia)   . Hypertension   . Need for prophylactic antibiotic   . Obstructive sleep apnea (adult) (pediatric)    pt. doesn't use his machine at home.  . Other testicular hypofunction   . Pulmonary nodule, left 2015   2cm, ?sarcoid by biopsy  . Unspecified glaucoma(365.9)     Past Surgical History:  Procedure Laterality Date  . AORTIC VALVE REPLACEMENT N/A 07/02/2014   Procedure: AORTIC VALVE REPLACEMENT (AVR) with 23 Aortic Magna Ease;  Surgeon: Grace Isaac, MD  . ASCENDING AORTIC ROOT REPLACEMENT N/A 07/02/2014   Procedure: supra coronary  ASCENDING AORTIC REPLACEMENT to the Inominate Artery with 35mm Hemashield Platinum with  circulatory arrest;  Surgeon: Grace Isaac, MD  . [place]    . CARDIAC VALVE  REPLACEMENT    . CATARACT EXTRACTION Bilateral   . COLONOSCOPY  2013   WNL, rpt 5 yrs [name])  . COLONOSCOPY  2018   1 TA, diverticulosis, rpt 5 yrs [name])  . CORONARY ARTERY BYPASS GRAFT    . EYE SURGERY    . INTRAOPERATIVE TRANSESOPHAGEAL ECHOCARDIOGRAM N/A 07/02/2014  . JOINT REPLACEMENT Right 14   partial replacement  . KNEE SURGERY Right 05/2012   arthroscopic  . KNEE SURGERY Right 12/2013   patella  . LEFT AND RIGHT HEART CATHETERIZATION WITH CORONARY ANGIOGRAM N/A 05/22/2014   no significant obstructive CAD ([name])  . LITHOTRIPSY    . LUNG BIOPSY Left 2015   by IR - benign  . MEDIAL PARTIAL KNEE REPLACEMENT Right 03/2013  . RHINOPLASTY  1980s  . VENTRAL HERNIA REPAIR  08/26/2015  . VENTRAL HERNIA REPAIR N/A 08/26/2015   Procedure: VENTRAL HERNIA REPAIR WITH MESH ADULT;  Surgeon: Grace Isaac, MD;  Location: [place];  Service: Vascular;  Laterality: N/A;    There were no vitals filed for this visit.  Subjective Assessment - 10/09/18 0801    Subjective  Patient had a good weekend, went and saw his granddaughter play basketball. Has been compliant with HEP.  Took his medication a week ago and caused him to pass  out.  Agrees to talk to his doctor about this.     Pertinent History  Patient is a pleasant 73 year old male for right shoulder that began mid September..  Patient has had intermittent shoulder pain for months after he was lifting weights and felt a pop in his shoulder after an overhead movement. MRI imaging per physician note indicated a significant tear of supraspinatus and infraspinatus tendons as well as atrophy of infraspinatus muscle. Moderate tendinosis of intra articular portion of biceps tendon noted.  Patient regularly sees cardiology for aortic stenosis and thoracic aortic aneurysms s/p pericardial tissue AVR and aneurysm graft (2015). 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 He enjoyed softball and walking 40 minutes 3days/week.  Patient has been  recommended to get a cortisone surgery and have physical therapy. Wants to return to softball in march.     Limitations  Lifting;House hold activities;Other (comment)    How long can you sit comfortably?  n/a    How long can you stand comfortably?  n/a    How long can you walk comfortably?  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  No/denies      UBE:  Forward: 2 minutes, backwards 2 minutes, verbal cueing for upright posture for correct muscle recruitment position   Supine:  Stabilization in ER with GTB 4x 30 seconds in supine, PT pulling on theraband for rhythmic stabilization  Stabilization in shoulder flexion to 90 degrees with retraction 3x 30 seconds with rhythmic perturbations to 1 inch above elbow.   RUE, ER stretch PROM 8x 20 second holds, IR stretch PROM 8x 20 second holds  Pectoral STM/trigger point, subscap STM 4 minutes  Bicep STM/trigger point with movement (flexion/extension) x 3 minutes   Distraction of Gh with horizontal adduction 10x to increase open space of joint for full muscle tissue length and mobility.      Quadruped: Scapular depression and elevation: cat cow for scapula x 10 ; 2 sets; tactile assistance to retain R elbow extension.    Seated Scapular elevation -depression  mobilization with overpressure into depression for scapular positioning 60 seconds   Scapular retraction-protraction mobilization with overpressure into retraction for upright neutral alignment 60 seconds  R upper trap stretch 30 second holds.    Standing:    Body blade: static hold in front x 30 seconds, lateral movement : windshield wiper x 30 seconds, D2 pattern 30 seconds  Standing against wall: scapula against wall, gluteals against wall, feet shoulder width apart with cueing to decrease arching. Arms in full flexion with thumbs  facing wall 15 small circles clockwise, 15 counterclockwise with frequent verbal cueing for position and decreasing velocity.                            PT Education - 10/09/18 0802    Education Details  exercise technique, posture, stability     Person(s) Educated  Patient    Methods  Explanation;Demonstration;Tactile cues;Verbal cues    Comprehension  Verbalized understanding;Returned demonstration;Tactile cues required;Need further instruction;Verbal cues required       PT Short Term Goals - 09/15/18 0951      PT SHORT TERM GOAL #1   Title  Pt will be independent with HEP in order to improve  strength and decrease pain in order to improve pain-free function at home and work.    Baseline  12/20: HEP given     Time  2    Period  Weeks    Status  New    Target Date  10/13/18      PT SHORT TERM GOAL #2   Title  Pt will decrease worst pain as reported on NPRS  to 3/10  in order to demonstrate clinically significant reduction in pain.    Baseline  12/20: 5/10     Time  2    Period  Weeks    Status  New    Target Date  10/13/18        PT Long Term Goals - 09/15/18 0951      PT LONG TERM GOAL #1   Title  Pt will decrease worst pain as reported on NPRS to 2/10 in order to demonstrate clinically significant reduction in pain.    Baseline  12/20: 5/10     Time  4    Period  Weeks    Status  New    Target Date  10/13/18      PT LONG TERM GOAL #2   Title  Pt will increase strength of R shoulder to 4+/5 in order to demonstrate improvement in strength and function     Baseline  12/20: R gross 4-/5 with ER 2+/5    Time  4    Period  Weeks    Status  New    Target Date  10/13/18      PT LONG TERM GOAL #3   Title  Patient will return to softball and gym program with no limitations or compensations for return to PLOF.     Baseline  12/20: unable to throw ball, has to alter gym program    Time  4    Period  Weeks    Status  Unable to assess    Target Date   10/13/18      PT LONG TERM GOAL #4   Title  Pt will decrease quick DASH Sport score by at least 8%  to 29.5% in order to demonstrate clinically significant reduction in disability.    Baseline  12/20: 37.5%    Time  4    Period  Weeks    Status  New    Target Date  10/13/18      PT LONG TERM GOAL #5   Title  Patient will increase R shoulder ER in standing actively to neutral position without compensatory patterning for return to proper throwing position.     Baseline  12/20: -10 degrees    Time  4    Period  Weeks    Status  New    Target Date  10/13/18            Plan - 10/09/18 0086    Clinical Impression Statement  Patient continues to benefit from stabilization interventions with additional focus on posture for correct glenohumeral alignment. Patient has noted improved ability to retain scapular depression allowing for increase stabilization intervention progression. Quadruped is tolerated for short duration with noted tightening of bicep resulting in elbow flexion. Pt will benefit from PT services to address deficits in strength, mobility, and pain in order to return to full function at home and to softball with less shoulder pain.    Rehab Potential  Excellent    Clinical Impairments Affecting Rehab Potential  (+) previous level of function,  high pain tolerance (-) chronicity of R shoulder activation, concurent cardiac complications    PT Frequency  2x / week    PT Duration  4 weeks    PT Treatment/Interventions  ADLs/Self Care Home Management;Aquatic Therapy;Biofeedback;Cryotherapy;Electrical Stimulation;Iontophoresis 4mg /ml Dexamethasone;Moist Heat;Traction;Ultrasound;Therapeutic exercise;Therapeutic activities;Functional mobility training;Neuromuscular re-education;Patient/family education;Manual techniques;Compression bandaging;Scar mobilization;Passive range of motion;Vasopneumatic Device;Taping;Splinting;Energy conservation;Dry needling    PT Next Visit Plan  recert      PT Home Exercise Plan  see above    Consulted and Agree with Plan of Care  Patient       Patient will benefit from skilled therapeutic intervention in order to improve the following deficits and impairments:  Cardiopulmonary status limiting activity, Decreased activity tolerance, Decreased endurance, Decreased range of motion, Decreased strength, Hypermobility, Hypomobility, Increased fascial restricitons, Increased edema, Impaired flexibility, Impaired UE functional use, Postural dysfunction, Improper body mechanics, Pain  Visit Diagnosis: Acute pain of right shoulder  Stiffness of right shoulder, not elsewhere classified  Muscle weakness     Problem List Patient Active Problem List   Diagnosis Date Noted  . History of colonic polyps 02/18/2017  . Thrombosed external hemorrhoid 02/18/2017  . CAD (coronary artery disease) 10/17/2016  . Thoracic aortic atherosclerosis ([place]) 10/17/2016  . Health maintenance examination 08/12/2015  . Advanced care planning/counseling discussion 08/05/2014  . Medicare annual wellness visit, subsequent 08/05/2014  . Prediabetes 08/05/2014  . 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 S/P AVR (aortic valve replacement) and aortoplasty 07/23/2014  . Thoracic ascending aortic aneurysm ([place]) 07/02/2014  . Sarcoidosis 04/26/2014  . Asthmatic bronchitis 04/24/2014  . Pulmonary nodule, left   . Gross hematuria 11/06/2013  . Essential hypertension 08/29/2013  . Aortic insufficiency   . Aortic valve stenosis, severe   . DJD (degenerative joint disease) 02/27/2013  . Knee pain 02/27/2013  . Wedge compression fracture of thoracic vertebra ([place]) 02/27/2013  . TESTICULAR HYPOFUNCTION 09/08/2010  . Recurrent nephrolithiasis 08/23/2009  . 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 Glaucoma 08/22/2008  . HYPERCHOLESTEROLEMIA 12/18/2007  . Obstructive sleep apnea 12/18/2007  . ALLERGIC RHINITIS 12/18/2007   Janna Arch, PT, DPT   10/09/2018, 9:53 AM  [place] MAIN [facility] SERVICES [address] [place], [place], [address] Phone: [phone]   Fax:  [phone]  Name: ESCHER HARR MRN: [id number] Date of Birth: 1946-04-23

## 2018-10-10 DIAGNOSIS — H401131 Primary open-angle glaucoma, bilateral, mild stage: Secondary | ICD-10-CM | POA: Diagnosis not present

## 2018-10-13 ENCOUNTER — Ambulatory Visit: Payer: Medicare HMO

## 2018-10-13 DIAGNOSIS — M6281 Muscle weakness (generalized): Secondary | ICD-10-CM | POA: Diagnosis not present

## 2018-10-13 DIAGNOSIS — R293 Abnormal posture: Secondary | ICD-10-CM

## 2018-10-13 DIAGNOSIS — M25611 Stiffness of right shoulder, not elsewhere classified: Secondary | ICD-10-CM

## 2018-10-13 DIAGNOSIS — M25511 Pain in right shoulder: Secondary | ICD-10-CM | POA: Diagnosis not present

## 2018-10-13 NOTE — Therapy (Signed)
[place] [facility] SERVICES [address] [place], [place], [address] Phone: [phone]   Fax:  [phone]  Physical Therapy Treatment/ [facility]ERT  Patient Details  Name: Jeremy Meadows MRN: [id number] Date of Birth: 1946/09/06 Referring Provider (PT): Dr. Vonna Drafts   Encounter Date: 10/13/2018  PT End of Session - 10/13/18 0852    Visit Number  8    Number of Visits  16    Date for PT Re-Evaluation  11/10/18    Authorization Type  8/10 PN start 12/20    PT Start Time  0846    PT Stop Time  0930    PT Time Calculation (min)  44 min    Activity Tolerance  Patient tolerated treatment well    Behavior During Therapy  [facility] for tasks assessed/performed       Past Medical History:  Diagnosis Date  . Allergic rhinitis, cause unspecified   . Aortic insufficiency    s/p AVR 2015  . Aortic root enlargement (HCC)    thoracic aorta  . Aortic valve stenosis 2015   s/p AVR 2015 [name]) - completed cardiac rehab 08/2014  . Arthritis   . Asthma   . Atrial fibrillation ([place]) 07/23/2014  . Benign neoplasm of colon   . Gallstones    by CT  . History of kidney stones   . History of nephrolithiasis   . HLD (hyperlipidemia)   . Hypertension   . Need for prophylactic antibiotic   . Obstructive sleep apnea (adult) (pediatric)    pt. doesn't use his machine at home.  . Other testicular hypofunction   . Pulmonary nodule, left 2015   2cm, ?sarcoid by biopsy  . Unspecified glaucoma(365.9)     Past Surgical History:  Procedure Laterality Date  . AORTIC VALVE REPLACEMENT N/A 07/02/2014   Procedure: AORTIC VALVE REPLACEMENT (AVR) with 23 Aortic Magna Ease;  Surgeon: Grace Isaac, MD  . ASCENDING AORTIC ROOT REPLACEMENT N/A 07/02/2014   Procedure: supra coronary  ASCENDING AORTIC REPLACEMENT to the Inominate Artery with 48m Hemashield Platinum with  circulatory arrest;  Surgeon: EGrace Isaac MD  . [place]   .  CARDIAC VALVE REPLACEMENT    . CATARACT EXTRACTION Bilateral   . COLONOSCOPY  2013   WNL, rpt 5 yrs ([name]  . COLONOSCOPY  2018   1 TA, diverticulosis, rpt 5 yrs ([name]  . CORONARY ARTERY BYPASS GRAFT    . EYE SURGERY    . INTRAOPERATIVE TRANSESOPHAGEAL ECHOCARDIOGRAM N/A 07/02/2014  . JOINT REPLACEMENT Right 14   partial replacement  . KNEE SURGERY Right 05/2012   arthroscopic  . KNEE SURGERY Right 12/2013   patella  . LEFT AND RIGHT HEART CATHETERIZATION WITH CORONARY ANGIOGRAM N/A 05/22/2014   no significant obstructive CAD ([place]  . LITHOTRIPSY    . LUNG BIOPSY Left 2015   by IR - benign  . MEDIAL PARTIAL KNEE REPLACEMENT Right 03/2013  . RHINOPLASTY  1980s  . VENTRAL HERNIA REPAIR  08/26/2015  . VENTRAL HERNIA REPAIR N/A 08/26/2015   Procedure: VENTRAL HERNIA REPAIR WITH MESH ADULT;  Surgeon: EGrace Isaac MD;  Location: [facility]  Service: Vascular;  Laterality: N/A;    There were no vitals filed for this visit.  Subjective Assessment - 10/13/18 0851    Subjective  Patient reports he had a good workout yesterday, has occasional sharp pain in L arm and has soreness in R arm when waking up. Still not able to  throw a baseball.     Pertinent History  Patient is a pleasant 73 year old male for right shoulder that began mid September..  Patient has had intermittent shoulder pain for months after he was lifting weights and felt a pop in his shoulder after an overhead movement. MRI imaging per physician note indicated a significant tear of supraspinatus and infraspinatus tendons as well as atrophy of infraspinatus muscle. Moderate tendinosis of intra articular portion of biceps tendon noted.  Patient regularly sees cardiology for aortic stenosis and thoracic aortic aneurysms s/p pericardial tissue AVR and aneurysm graft (2015). 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 He enjoyed softball and walking 40 minutes 3days/week.  Patient has been recommended to get a cortisone surgery  and have physical therapy. Wants to return to softball in march.     Limitations  Lifting;House hold activities;Other (comment)    How long can you sit comfortably?  n/a    How long can you stand comfortably?  n/a    How long can you walk comfortably?  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  No/denies       Redo goals VAS: every once in a while get sharp in in L arm, 2-3/10 pain in R  Strength   Right Left  Shoulder Flexion 4/5 pain with resistance 4+/5  Shoulder Abduction 4/5 4+/5  ER 2+/5 3-/5  IR 2+/5 painful      Softball  Quickdash sport: 30%, full 2.27% R ER for throwing pattern (was -10)    UBE scifit 2 min forward, 2 minute backwards Level 4.0. cues for upright posture  Manual:  ER/ IR PROM holds of 60 seconds, 10x. Bringing arm to 90 degrees abduction with elbow propped on PT knee for stabilization. Stability provided to [facility] for ER and inferior pressure applied for reduction of impingement with IR  Pectoral STM/trigger point to decrease muscle tissue tension and improve length for postural correction 4 minutes  Bicep STM/trigger point with movement (extension/flexion of elbow) x 3 minutes  Softball:   Practice biomechanics of throw with softball from home . Tossing ball with various throwing patterns used in game setting, IR, ER with varying angles of elbow flexion and rotation of glenohumeral joint with therapist observing compensatory hike of shoulder as ER increases, no pain however significant weakness noted.x2 minutes   Back against wall:   PT providing tactile/min stabilization in inferior direction on scapula/clavical to reduce shoulder shrug, focus on keeping scapula against wall bringing shoulder into 90 degrees abduction then PT stabilizing forearm that is holding ball with varying levels of rotation and elbow  flexion 15x neutral, 15x ~20 degrees elbow flexion, 15x ~20 degrees elbow extension. Performed a secondary time with elbow touching wall indicating improved pectoral muscle tissue length  Throwing ball against post wall intervention with improved biomechanics and decreased shoulder shrug. Patient reports it feels "looser and easier to throw". No pain                          PT Education - 10/13/18 0852    Education Details  exercise technique, goals, POC, posture, stability     Person(s) Educated  Patient    Methods  Explanation;Demonstration;Tactile cues;Verbal cues    Comprehension  Verbalized understanding;Returned demonstration;Tactile  cues required;Need further instruction;Verbal cues required       PT Short Term Goals - 10/13/18 0858      PT SHORT TERM GOAL #1   Title  Pt will be independent with HEP in order to improve strength and decrease pain in order to improve pain-free function at home and work.    Baseline  12/20: HEP given 1/17: HEP compliant    Time  2    Period  Weeks    Status  Partially Met      PT SHORT TERM GOAL #2   Title  Pt will decrease worst pain as reported on NPRS  to 3/10  in order to demonstrate clinically significant reduction in pain.    Baseline  12/20: 5/10 1/17: 3/10     Time  2    Period  Weeks    Status  Achieved        PT Long Term Goals - 10/13/18 0240      PT LONG TERM GOAL #1   Title  Pt will decrease worst pain as reported on NPRS to 2/10 in order to demonstrate clinically significant reduction in pain.    Baseline  12/20: 5/10 1/17: 3/10     Time  4    Period  Weeks    Status  Partially Met    Target Date  11/10/18      PT LONG TERM GOAL #2   Title  Pt will increase strength of R shoulder to 4+/5 in order to demonstrate improvement in strength and function     Baseline  12/20: R gross 4-/5 with ER 2+/5 1/17: 4/5 with ER 2+/5    Time  4    Period  Weeks    Status  Partially Met    Target Date  11/10/18       PT LONG TERM GOAL #3   Title  Patient will return to softball and gym program with no limitations or compensations for return to PLOF.     Baseline  12/20: unable to throw ball, has to alter gym program 1/17: returning to gym program with limitations, today is first attempt at throwing ball     Time  4    Period  Weeks    Status  Partially Met    Target Date  11/10/18      PT LONG TERM GOAL #4   Title  Pt will decrease quick DASH Sport score by at least 8%  to 29.5% in order to demonstrate clinically significant reduction in disability.    Baseline  12/20: 37.5% 1/17: 30%    Time  4    Period  Weeks    Status  Partially Met    Target Date  11/10/18      PT LONG TERM GOAL #5   Title  Patient will increase R shoulder ER in standing actively to neutral position without compensatory patterning for return to proper throwing position.     Baseline  12/20: -10 degrees 1/17: -9 degrees     Time  4    Period  Weeks    Status  On-going    Target Date  11/10/18            Plan - 10/13/18 1005    Clinical Impression Statement  Patient demonstrates improved progression towards goals at this time. Patient has excellent motivation throughout session and is compliant with HEP outside of therapy. Patient continues to demonstrate pain free ROM with increasing strength  within available ranges. Compensatory mechanisms continue to present themselves with fatigue resulting in need for tactile assistance for positioning as well as continued focus on stabilization and ROM.Patient's condition has the potential to improve in response to therapy. Maximum improvement is yet to be obtained. The anticipated improvement is attainable and reasonable in a generally predictable time.  Pt will benefit from PT services to address deficits in strength, mobility, and pain in order to return to full function at home and to softball with less shoulder pain.    Rehab Potential  Excellent    Clinical Impairments  Affecting Rehab Potential  (+) previous level of function, high pain tolerance (-) chronicity of R shoulder activation, concurent cardiac complications    PT Frequency  2x / week    PT Duration  4 weeks    PT Treatment/Interventions  ADLs/Self Care Home Management;Aquatic Therapy;Biofeedback;Cryotherapy;Electrical Stimulation;Iontophoresis 57m/ml Dexamethasone;Moist Heat;Traction;Ultrasound;Therapeutic exercise;Therapeutic activities;Functional mobility training;Neuromuscular re-education;Patient/family education;Manual techniques;Compression bandaging;Scar mobilization;Passive range of motion;Vasopneumatic Device;Taping;Splinting;Energy conservation;Dry needling    PT Next Visit Plan  throwing softball against wall, focus on biomechanics of movement with decreasing compensatory shrug    PT Home Exercise Plan  see above    Consulted and Agree with Plan of Care  Patient       Patient will benefit from skilled therapeutic intervention in order to improve the following deficits and impairments:  Cardiopulmonary status limiting activity, Decreased activity tolerance, Decreased endurance, Decreased range of motion, Decreased strength, Hypermobility, Hypomobility, Increased fascial restricitons, Increased edema, Impaired flexibility, Impaired UE functional use, Postural dysfunction, Improper body mechanics, Pain  Visit Diagnosis: Acute pain of right shoulder  Stiffness of right shoulder, not elsewhere classified  Muscle weakness  Abnormal posture     Problem List Patient Active Problem List   Diagnosis Date Noted  . History of colonic polyps 02/18/2017  . Thrombosed external hemorrhoid 02/18/2017  . CAD (coronary artery disease) 10/17/2016  . Thoracic aortic atherosclerosis ([place] 10/17/2016  . Health maintenance examination 08/12/2015  . Advanced care planning/counseling discussion 08/05/2014  . Medicare annual wellness visit, subsequent 08/05/2014  . Prediabetes 08/05/2014  . [place]  atrial fibrillation ([place] 07/23/2014  . S/P AVR (aortic valve replacement) and aortoplasty 07/23/2014  . Thoracic ascending aortic aneurysm ([place] 07/02/2014  . Sarcoidosis 04/26/2014  . Asthmatic bronchitis 04/24/2014  . Pulmonary nodule, left   . Gross hematuria 11/06/2013  . Essential hypertension 08/29/2013  . Aortic insufficiency   . Aortic valve stenosis, severe   . DJD (degenerative joint disease) 02/27/2013  . Knee pain 02/27/2013  . Wedge compression fracture of thoracic vertebra ([place] 02/27/2013  . TESTICULAR HYPOFUNCTION 09/08/2010  . Recurrent nephrolithiasis 08/23/2009  . 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 Glaucoma 08/22/2008  . HYPERCHOLESTEROLEMIA 12/18/2007  . Obstructive sleep apnea 12/18/2007  . ALLERGIC RHINITIS 12/18/2007   MJanna Arch PT, DPT   10/13/2018, 10:09 AM  [place] [facility]SERV[facility] [address] [place] [address]Phone: [phone]  Fax:  [phone] Name: SJABARRI STEFANELLIMRN: [id number] of Birth: 109-Dec-1947

## 2018-10-16 ENCOUNTER — Encounter: Payer: Self-pay | Admitting: Physical Therapy

## 2018-10-16 ENCOUNTER — Ambulatory Visit: Payer: Medicare HMO | Admitting: Physical Therapy

## 2018-10-16 DIAGNOSIS — M25611 Stiffness of right shoulder, not elsewhere classified: Secondary | ICD-10-CM

## 2018-10-16 DIAGNOSIS — M6281 Muscle weakness (generalized): Secondary | ICD-10-CM | POA: Diagnosis not present

## 2018-10-16 DIAGNOSIS — R293 Abnormal posture: Secondary | ICD-10-CM

## 2018-10-16 DIAGNOSIS — M25511 Pain in right shoulder: Secondary | ICD-10-CM

## 2018-10-16 NOTE — Therapy (Signed)
[place] MAIN [facility] SERVICES [address] [place], [place], [address] Phone: [phone]   Fax:  [phone]  Physical Therapy Treatment  Patient Details  Name: Jeremy Meadows MRN: [id number] Date of Birth: 1945-10-25 Referring Provider (PT): Dr. Vonna Drafts   Encounter Date: 10/16/2018  PT End of Session - 10/16/18 0755    Visit Number  9    Number of Visits  16    Date for PT Re-Evaluation  11/10/18    Authorization Type  9/10 PN start 12/20    PT Start Time  0800    PT Stop Time  0845    PT Time Calculation (min)  45 min    Activity Tolerance  Patient tolerated treatment well    Behavior During Therapy  [facility] for tasks assessed/performed       Past Medical History:  Diagnosis Date  . Allergic rhinitis, cause unspecified   . Aortic insufficiency    s/p AVR 2015  . Aortic root enlargement (HCC)    thoracic aorta  . Aortic valve stenosis 2015   s/p AVR 2015 [name]) - completed cardiac rehab 08/2014  . Arthritis   . Asthma   . Atrial fibrillation ([place]) 07/23/2014  . Benign neoplasm of colon   . Gallstones    by CT  . History of kidney stones   . History of nephrolithiasis   . HLD (hyperlipidemia)   . Hypertension   . Need for prophylactic antibiotic   . Obstructive sleep apnea (adult) (pediatric)    pt. doesn't use his machine at home.  . Other testicular hypofunction   . Pulmonary nodule, left 2015   2cm, ?sarcoid by biopsy  . Unspecified glaucoma(365.9)     Past Surgical History:  Procedure Laterality Date  . AORTIC VALVE REPLACEMENT N/A 07/02/2014   Procedure: AORTIC VALVE REPLACEMENT (AVR) with 23 Aortic Magna Ease;  Surgeon: Grace Isaac, MD  . ASCENDING AORTIC ROOT REPLACEMENT N/A 07/02/2014   Procedure: supra coronary  ASCENDING AORTIC REPLACEMENT to the Inominate Artery with 5m Hemashield Platinum with  circulatory arrest;  Surgeon: EGrace Isaac MD  . [place]   . CARDIAC  VALVE REPLACEMENT    . CATARACT EXTRACTION Bilateral   . COLONOSCOPY  2013   WNL, rpt 5 yrs ([name]  . COLONOSCOPY  2018   1 TA, diverticulosis, rpt 5 yrs ([name]  . CORONARY ARTERY BYPASS GRAFT    . EYE SURGERY    . INTRAOPERATIVE TRANSESOPHAGEAL ECHOCARDIOGRAM N/A 07/02/2014  . JOINT REPLACEMENT Right 14   partial replacement  . KNEE SURGERY Right 05/2012   arthroscopic  . KNEE SURGERY Right 12/2013   patella  . LEFT AND RIGHT HEART CATHETERIZATION WITH CORONARY ANGIOGRAM N/A 05/22/2014   no significant obstructive CAD ([place]  . LITHOTRIPSY    . LUNG BIOPSY Left 2015   by IR - benign  . MEDIAL PARTIAL KNEE REPLACEMENT Right 03/2013  . RHINOPLASTY  1980s  . VENTRAL HERNIA REPAIR  08/26/2015  . VENTRAL HERNIA REPAIR N/A 08/26/2015   Procedure: VENTRAL HERNIA REPAIR WITH MESH ADULT;  Surgeon: EGrace Isaac MD;  [place]  Service: Vascular;  Laterality: N/A;    There were no vitals filed for this visit.  Subjective Assessment - 10/16/18 0803    Subjective  Patient states that he feels fully functional outside of sport activities, but he does feel he is getting better wiht throwing as well. He states that the pain in  the L shoulder has subsided.     Pertinent History  Patient is a pleasant 73 year old male for right shoulder that began mid September..  Patient has had intermittent shoulder pain for months after he was lifting weights and felt a pop in his shoulder after an overhead movement. MRI imaging per physician note indicated a significant tear of supraspinatus and infraspinatus tendons as well as atrophy of infraspinatus muscle. Moderate tendinosis of intra articular portion of biceps tendon noted.  Patient regularly sees cardiology for aortic stenosis and thoracic aortic aneurysms s/p pericardial tissue AVR and aneurysm graft (2015). 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 He enjoyed softball and walking 40 minutes 3days/week.  Patient has been recommended to  get a cortisone surgery and have physical therapy. Wants to return to softball in march.     Limitations  Lifting;House hold activities;Other (comment)    How long can you sit comfortably?  n/a    How long can you stand comfortably?  n/a    How long can you walk comfortably?  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  No/denies         TREATMENT  Manual Therapy:  ER/IR passive range lift off isometrics, x15 each, with PT assist to achieve greater passive range immediately after lift-off isometric.   Pectoral STM with arm abducted and externally rotated for improved ROM, decreased neuromuscular tightness, and improved AROM.    Neuromuscular Re-education:  Controlled articular rotations of R UE to include glenohumeral complex, elbow, and wrist with tactile cues to optimize isolated capsular movement. X3 each direction, each joint.   Scapular protractions and retractions from shoulder depressed position. 2lb bar with GTB posterior force; 4x15 Patient requires verbal, visual, and tactile cues for complete R scapular retraction and in order to maintain shoulder depression. Patient defaults to compensatory patterns including: shoulder elevation, elbow flexion, shoulder abduction.  Standing 90-90 (ER/ABD) stretch at wall with passive end range lift offs, 10 x 10 sec hold.  Patient requires verbal and tactile cues to prevent compensatory patterns including: elbow extension, wrist extension, GH IR.   Patient Response to interventions:  Patient reported improved perception of AROM after isometric lift-off activities and reports having greater awareness of joint position and compensations after neuromuscular interventions.    PT Education - 10/16/18 0856    Education Details  exercise technique, progression of controlled ROM, and HEP     Person(s) Educated  Patient    Methods  Explanation;Demonstration;Tactile cues;Verbal cues    Comprehension  Verbalized understanding;Need further instruction;Returned demonstration;Verbal cues required;Tactile cues required       PT Short Term Goals - 10/13/18 0858      PT SHORT TERM GOAL #1   Title  Pt will be independent with HEP in order to improve strength and decrease pain in order to improve pain-free function at home and work.    Baseline  12/20: HEP given 1/17: HEP compliant    Time  2    Period  Weeks    Status  Partially Met      PT SHORT TERM GOAL #2   Title  Pt will decrease worst pain as reported on NPRS  to 3/10  in order to demonstrate clinically significant reduction in pain.    Baseline  12/20: 5/10  1/17: 3/10     Time  2    Period  Weeks    Status  Achieved        PT Long Term Goals - 10/13/18 1771      PT LONG TERM GOAL #1   Title  Pt will decrease worst pain as reported on NPRS to 2/10 in order to demonstrate clinically significant reduction in pain.    Baseline  12/20: 5/10 1/17: 3/10     Time  4    Period  Weeks    Status  Partially Met    Target Date  11/10/18      PT LONG TERM GOAL #2   Title  Pt will increase strength of R shoulder to 4+/5 in order to demonstrate improvement in strength and function     Baseline  12/20: R gross 4-/5 with ER 2+/5 1/17: 4/5 with ER 2+/5    Time  4    Period  Weeks    Status  Partially Met    Target Date  11/10/18      PT LONG TERM GOAL #3   Title  Patient will return to softball and gym program with no limitations or compensations for return to PLOF.     Baseline  12/20: unable to throw ball, has to alter gym program 1/17: returning to gym program with limitations, today is first attempt at throwing ball     Time  4    Period  Weeks    Status  Partially Met    Target Date  11/10/18      PT LONG TERM GOAL #4   Title  Pt will decrease quick DASH Sport score by at least 8%  to 29.5% in order to demonstrate  clinically significant reduction in disability.    Baseline  12/20: 37.5% 1/17: 30%    Time  4    Period  Weeks    Status  Partially Met    Target Date  11/10/18      PT LONG TERM GOAL #5   Title  Patient will increase R shoulder ER in standing actively to neutral position without compensatory patterning for return to proper throwing position.     Baseline  12/20: -10 degrees 1/17: -9 degrees     Time  4    Period  Weeks    Status  On-going    Target Date  11/10/18            Plan - 10/16/18 0900    Clinical Impression Statement  Patient continues to present with excellent motivation and compliance with HEP. He responds well to end range isometric holds and passive range lift off exercises for improved functional ROM. Patient does still require visual and tactile cues for the prevention of compensations when fatigued. Patient will continue to benefit from skilled therapeutic intervention to address deficits in strength, mobility, and pain for a return to his PLOF.    Rehab Potential  Excellent    Clinical Impairments Affecting Rehab Potential  (+) previous level of function, high pain tolerance (-) chronicity of R shoulder activation, concurent cardiac complications    PT Frequency  2x / week    PT Duration  4 weeks    PT Treatment/Interventions  ADLs/Self Care Home Management;Aquatic Therapy;Biofeedback;Cryotherapy;Electrical Stimulation;Iontophoresis 70m/ml Dexamethasone;Moist Heat;Traction;Ultrasound;Therapeutic exercise;Therapeutic activities;Functional mobility training;Neuromuscular re-education;Patient/family education;Manual techniques;Compression bandaging;Scar mobilization;Passive range of motion;Vasopneumatic Device;Taping;Splinting;Energy conservation;Dry needling    PT Next Visit Plan  throwing softball against wall, focus on biomechanics of  movement with decreasing compensatory shrug    PT Home Exercise Plan  see above    Consulted and Agree with Plan of Care  Patient        Patient will benefit from skilled therapeutic intervention in order to improve the following deficits and impairments:  Cardiopulmonary status limiting activity, Decreased activity tolerance, Decreased endurance, Decreased range of motion, Decreased strength, Hypermobility, Hypomobility, Increased fascial restricitons, Increased edema, Impaired flexibility, Impaired UE functional use, Postural dysfunction, Improper body mechanics, Pain  Visit Diagnosis: Acute pain of right shoulder  Stiffness of right shoulder, not elsewhere classified  Muscle weakness  Abnormal posture     Problem List Patient Active Problem List   Diagnosis Date Noted  . History of colonic polyps 02/18/2017  . Thrombosed external hemorrhoid 02/18/2017  . CAD (coronary artery disease) 10/17/2016  . Thoracic aortic atherosclerosis ([place]) 10/17/2016  . Health maintenance examination 08/12/2015  . Advanced care planning/counseling discussion 08/05/2014  . Medicare annual wellness visit, subsequent 08/05/2014  . Prediabetes 08/05/2014  . 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 S/P AVR (aortic valve replacement) and aortoplasty 07/23/2014  . Thoracic ascending aortic aneurysm ([place]) 07/02/2014  . Sarcoidosis 04/26/2014  . Asthmatic bronchitis 04/24/2014  . Pulmonary nodule, left   . Gross hematuria 11/06/2013  . Essential hypertension 08/29/2013  . Aortic insufficiency   . Aortic valve stenosis, severe   . DJD (degenerative joint disease) 02/27/2013  . Knee pain 02/27/2013  . Wedge compression fracture of thoracic vertebra ([place]) 02/27/2013  . TESTICULAR HYPOFUNCTION 09/08/2010  . Recurrent nephrolithiasis 08/23/2009  . 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 Glaucoma 08/22/2008  . HYPERCHOLESTEROLEMIA 12/18/2007  . Obstructive sleep apnea 12/18/2007  . ALLERGIC RHINITIS 12/18/2007   Myles Gip PT, DPT [phone] 10/16/2018, 9:04 AM  [place] MAIN [facility] SERVICES [address] [place], [place], [address] Phone: [phone]   Fax:  [phone]  Name: BINYOMIN BRANN MRN: [id number] Date of Birth: December 25, 1945

## 2018-10-18 ENCOUNTER — Ambulatory Visit: Payer: Medicare HMO

## 2018-10-23 ENCOUNTER — Ambulatory Visit: Payer: Medicare HMO

## 2018-10-23 DIAGNOSIS — M6281 Muscle weakness (generalized): Secondary | ICD-10-CM

## 2018-10-23 DIAGNOSIS — M25511 Pain in right shoulder: Secondary | ICD-10-CM | POA: Diagnosis not present

## 2018-10-23 DIAGNOSIS — M25611 Stiffness of right shoulder, not elsewhere classified: Secondary | ICD-10-CM | POA: Diagnosis not present

## 2018-10-23 DIAGNOSIS — R293 Abnormal posture: Secondary | ICD-10-CM | POA: Diagnosis not present

## 2018-10-23 NOTE — Therapy (Signed)
[place] [facility] SERVICES [address] [place], [place], [address] Phone: [phone]   Fax:  [phone]  Physical Therapy Treatment Physical Therapy Progress Note   Dates of reporting period  09/15/18  to   10/23/18   Patient Details  Name: Jeremy Meadows MRN: [id number] Date of Birth: 09/28/1945 Referring Provider (PT): Dr. Vonna Drafts   Encounter Date: 10/23/2018  PT End of Session - 10/23/18 0851    Visit Number  10    Number of Visits  16    Date for PT Re-Evaluation  11/10/18    Authorization Type  10/10 PN start 12/20 (next session 1/10 PN start 1/27)     PT Start Time  0800    PT Stop Time  0844    PT Time Calculation (min)  44 min    Activity Tolerance  Patient tolerated treatment well    Behavior During Therapy  [facility] for tasks assessed/performed       Past Medical History:  Diagnosis Date  . Allergic rhinitis, cause unspecified   . Aortic insufficiency    s/p AVR 2015  . Aortic root enlargement (HCC)    thoracic aorta  . Aortic valve stenosis 2015   s/p AVR 2015 [name]) - completed cardiac rehab 08/2014  . Arthritis   . Asthma   . Atrial fibrillation ([place]) 07/23/2014  . Benign neoplasm of colon   . Gallstones    by CT  . History of kidney stones   . History of nephrolithiasis   . HLD (hyperlipidemia)   . Hypertension   . Need for prophylactic antibiotic   . Obstructive sleep apnea (adult) (pediatric)    pt. doesn't use his machine at home.  . Other testicular hypofunction   . Pulmonary nodule, left 2015   2cm, ?sarcoid by biopsy  . Unspecified glaucoma(365.9)     Past Surgical History:  Procedure Laterality Date  . AORTIC VALVE REPLACEMENT N/A 07/02/2014   Procedure: AORTIC VALVE REPLACEMENT (AVR) with 23 Aortic Magna Ease;  Surgeon: Grace Isaac, MD  . ASCENDING AORTIC ROOT REPLACEMENT N/A 07/02/2014   Procedure: supra coronary  ASCENDING AORTIC REPLACEMENT to the Inominate Artery with 11m  Hemashield Platinum with  circulatory arrest;  Surgeon: EGrace Isaac MD  . [place]   . CARDIAC VALVE REPLAC[place]    . CATARACT EXTRACTION Bilateral   . COLONOSCOPY  2013   WNL, rpt 5 yrs ([name]  . COLONOSCOPY  2018   1 TA, diverticulosis, rpt 5 yrs ([name]  . CORONARY ARTERY BYPASS GRAFT    . EYE SURGERY    . INTRAOPERATIVE TRANSESOPHAGEAL ECHOCARDIOGRAM N/A 07/02/2014  . JOINT REPLACEMENT Right 14   partial replacement  . KNEE SURGERY Right 05/2012   arthroscopic  . KNEE SURGERY Right 12/2013   patella  . LEFT AND RIGHT HEART CATHETERIZATION WITH CORONARY ANGIOGRAM N/A 05/22/2014   no significant obstructive CAD ([place]  . LITHOTRIPSY    . LUNG BIOPSY Left 2015   by IR - benign  . MEDIAL PARTIAL KNEE REPLACEMENT Right 03/2013  . RHINOPLASTY  1980s  . VENTRAL HERNIA REPAIR  08/26/2015  . VENTRAL HERNIA REPAIR N/A 08/26/2015   Procedure: VENTRAL HERNIA REPAIR WITH MESH ADULT;  Surgeon: EGrace Isaac MD;  [facility]  Service: Vascular;  Laterality: N/A;    There were no vitals filed for this visit.  Subjective Assessment - 10/23/18 0850    Subjective  Patient reports he missed  [id number] session last week due to being mistaken about day of treatment. Continues to be compliant with HEP. Reports new stretches are helping with pain/discomfort in the morning.     Pertinent History  Patient is a pleasant 73 year old male for right shoulder that began mid September..  Patient has had intermittent shoulder pain for months after he was lifting weights and felt a pop in his shoulder after an overhead movement. MRI imaging per physician note indicated a significant tear of supraspinatus and infraspinatus tendons as well as atrophy of infraspinatus muscle. Moderate tendinosis of intra articular portion of biceps tendon noted.  Patient regularly sees cardiology for aortic stenosis and thoracic aortic aneurysms s/p pericardial tissue AVR and aneurysm graft (2015). Is  retired, was in Engineer, technical sales and had served in [employer]. He enjoyed softball and walking 40 minutes 3days/week.  Patient has been recommended to get a cortisone surgery and have physical therapy. Wants to return to softball in march.     Limitations  Lifting;House hold activities;Other (comment)    How long can you sit comfortably?  n/a    How long can you stand comfortably?  n/a    How long can you walk comfortably?  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  No/denies       TREATMENT   Manual Therapy:   ER/IR passive ROM, Elbow on PT knee/towel to promote neutral alignment, stabilization provided to [facility]. 10x 15 second holds    Pectoral STM with arm abducted and externally rotated for improved ROM, decreased neuromuscular tightness, and improved AROM.  Bicep STM with movement (flexion and extension of elbow) for muscle tissue lengthening to promote full extension of elbow during movement/throwing body-mechanics.    Upper trap stretch with overpressure at [facility] joint x 30 seconds SCM stretch seated with overpressure at [place] joint and occipital lobe x 30 seconds  Scapular depression/elevation mobilization x 10. Retraction/depression with overpressure x10   TherEx  Back against wall:              PT providing tactile/min stabilization in inferior direction on scapula/clavical to reduce shoulder shrug, focus on keeping scapula against wall bringing shoulder into 90 degrees abduction then PT stabilizing forearm that is holding ball with varying levels of rotation and elbow flexion 15x neutral, 15x ~20 degrees elbow flexion, 15x ~20 degrees elbow extension. Performed a secondary time with elbow touching wall indicating improved pectoral muscle tissue length  Throwing ball against post wall intervention with improved biomechanics and decreased shoulder shrug.  Patient reports it feels "looser and easier to throw". No pain  Scapular retractions with tactile cueing for body mechanics and stability. Noticed fatigue with muscular shaking. 10x 5-8 second holds   Body Blade: Abduction to 90 then bring forward into 90 degrees flexion, hold at each stop point 10 seconds, x5 trials.  Flexion overhead ~115-120 degrees hold 30 seconds, slight flexion of R elbow with fatigue Abduction hold at 90 x 30 seconds  Softball:              Practice biomechanics of throw with softball from home . Tossing ball with various throwing patterns used in game setting, IR, ER with varying angles of elbow flexion and rotation of glenohumeral joint with therapist observing compensatory hike of shoulder as ER increases, no pain however  significant weakness noted.x2 minutes    Patient Response to interventions:   Patient reported improved perception of AROM after isometric lift-off activities and reports having greater awareness of joint position and compensations after  interventions.       Patient's condition has the potential to improve in response to therapy. Maximum improvement is yet to be obtained. The anticipated improvement is attainable and reasonable in a generally predictable time.                        PT Education - 10/23/18 0851    Education Details  exercise technique, manual, throwing biomechanics    Person(s) Educated  Patient    Methods  Explanation;Demonstration;Tactile cues;Verbal cues    Comprehension  Verbalized understanding;Returned demonstration;Tactile cues required;Need further instruction;Verbal cues required       PT Short Term Goals - 10/13/18 0858      PT SHORT TERM GOAL #1   Title  Pt will be independent with HEP in order to improve strength and decrease pain in order to improve pain-free function at home and work.    Baseline  12/20: HEP given 1/17: HEP compliant    Time  2    Period  Weeks    Status  Partially  Met      PT SHORT TERM GOAL #2   Title  Pt will decrease worst pain as reported on NPRS  to 3/10  in order to demonstrate clinically significant reduction in pain.    Baseline  12/20: 5/10 1/17: 3/10     Time  2    Period  Weeks    Status  Achieved        PT Long Term Goals - 10/13/18 6301      PT LONG TERM GOAL #1   Title  Pt will decrease worst pain as reported on NPRS to 2/10 in order to demonstrate clinically significant reduction in pain.    Baseline  12/20: 5/10 1/17: 3/10     Time  4    Period  Weeks    Status  Partially Met    Target Date  11/10/18      PT LONG TERM GOAL #2   Title  Pt will increase strength of R shoulder to 4+/5 in order to demonstrate improvement in strength and function     Baseline  12/20: R gross 4-/5 with ER 2+/5 1/17: 4/5 with ER 2+/5    Time  4    Period  Weeks    Status  Partially Met    Target Date  11/10/18      PT LONG TERM GOAL #3   Title  Patient will return to softball and gym program with no limitations or compensations for return to PLOF.     Baseline  12/20: unable to throw ball, has to alter gym program 1/17: returning to gym program with limitations, today is first attempt at throwing ball     Time  4    Period  Weeks    Status  Partially Met    Target Date  11/10/18      PT LONG TERM GOAL #4   Title  Pt will decrease quick DASH Sport score by at least 8%  to 29.5% in order to demonstrate clinically significant reduction in disability.    Baseline  12/20: 37.5% 1/17: 30%    Time  4    Period  Weeks    Status  Partially Met  Target Date  11/10/18      PT LONG TERM GOAL #5   Title  Patient will increase R shoulder ER in standing actively to neutral position without compensatory patterning for return to proper throwing position.     Baseline  12/20: -10 degrees 1/17: -9 degrees     Time  4    Period  Weeks    Status  On-going    Target Date  11/10/18            Plan - 10/23/18 0855    Clinical Impression  Statement   Patient presents with good motivation to session today Decreased need for tactile cueing for depression of R shoulder noted indicating learned patterning of posture awareness. Strength continues to progress with noted limitations in IR and ER.  Patient's condition has the potential to improve in response to therapy. Maximum improvement is yet to be obtained. The anticipated improvement is attainable and reasonable in a generally predictable time.  Patient reports that he is progressing towards his baseline however does still have a little weakness at initial portion of throw. Patient will continue to benefit from skilled therapeutic intervention to address deficits in strength, mobility, and pain for a return to his PLOF.    Rehab Potential  Excellent    Clinical Impairments Affecting Rehab Potential  (+) previous level of function, high pain tolerance (-) chronicity of R shoulder activation, concurent cardiac complications    PT Frequency  2x / week    PT Duration  4 weeks    PT Treatment/Interventions  ADLs/Self Care Home Management;Aquatic Therapy;Biofeedback;Cryotherapy;Electrical Stimulation;Iontophoresis 72m/ml Dexamethasone;Moist Heat;Traction;Ultrasound;Therapeutic exercise;Therapeutic activities;Functional mobility training;Neuromuscular re-education;Patient/family education;Manual techniques;Compression bandaging;Scar mobilization;Passive range of motion;Vasopneumatic Device;Taping;Splinting;Energy conservation;Dry needling    PT Next Visit Plan  throwing softball against wall, focus on biomechanics of movement with decreasing compensatory shrug    PT Home Exercise Plan  see above    Consulted and Agree with Plan of Care  Patient       Patient will benefit from skilled therapeutic intervention in order to improve the following deficits and impairments:  Cardiopulmonary status limiting activity, Decreased activity tolerance, Decreased endurance, Decreased range of motion, Decreased  strength, Hypermobility, Hypomobility, Increased fascial restricitons, Increased edema, Impaired flexibility, Impaired UE functional use, Postural dysfunction, Improper body mechanics, Pain  Visit Diagnosis: Acute pain of right shoulder  Stiffness of right shoulder, not elsewhere classified  Muscle weakness  Abnormal posture     Problem List Patient Active Problem List   Diagnosis Date Noted  . History of colonic polyps 02/18/2017  . Thrombosed external hemorrhoid 02/18/2017  . CAD (coronary artery disease) 10/17/2016  . Thoracic aortic atherosclerosis ([place] 10/17/2016  . Health maintenance examination 08/12/2015  . Advanced care planning/counseling discussion 08/05/2014  . Medicare annual wellness visit, subsequent 08/05/2014  . Prediabetes 08/05/2014  . [place] atrial fibrillation ([place] 07/23/2014  . S/P AVR (aortic valve replacement) and aortoplasty 07/23/2014  . Thoracic ascending aortic aneurysm ([place] 07/02/2014  . Sarcoidosis 04/26/2014  . Asthmatic bronchitis 04/24/2014  . Pulmonary nodule, left   . Gross hematuria 11/06/2013  . Essential hypertension 08/29/2013  . Aortic insufficiency   . Aortic valve stenosis, severe   . DJD (degenerative joint disease) 02/27/2013  . Knee pain 02/27/2013  . Wedge compression fracture of thoracic vertebra ([place] 02/27/2013  . TESTICULAR HYPOFUNCTION 09/08/2010  . Recurrent nephrolithiasis 08/23/2009  . 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 Glaucoma 08/22/2008  . HYPERCHOLESTEROLEMIA 12/18/2007  . Obstructive sleep apnea 12/18/2007  . ALLERGIC RHINITIS 12/18/2007  Janna Arch, PT, DPT   10/23/2018, 8:56 AM  [place] [facility] SERVICES [address] [place], [place], [address] Phone: [phone]   Fax:  [phone]  Name: Jeremy Meadows MRN: [id number] Date of Birth: 25-Dec-1945

## 2018-10-25 ENCOUNTER — Ambulatory Visit: Payer: Medicare HMO

## 2018-10-25 DIAGNOSIS — M25511 Pain in right shoulder: Secondary | ICD-10-CM

## 2018-10-25 DIAGNOSIS — R293 Abnormal posture: Secondary | ICD-10-CM

## 2018-10-25 DIAGNOSIS — M6281 Muscle weakness (generalized): Secondary | ICD-10-CM | POA: Diagnosis not present

## 2018-10-25 DIAGNOSIS — M25611 Stiffness of right shoulder, not elsewhere classified: Secondary | ICD-10-CM

## 2018-10-25 NOTE — Therapy (Signed)
[facility] SERVICES [address] [place], [place], [address] Phone: [phone]   Fax:  [phone]  Physical Therapy Treatment  Patient Details  Name: Jeremy Meadows MRN: [id number] Date of Birth: 1946-07-07 Referring Provider (PT): Dr. Vonna Drafts   Encounter Date: 10/25/2018  PT End of Session - 10/25/18 0821    Visit Number  11    Number of Visits  16    Date for PT Re-Evaluation  11/10/18    Authorization Type   1/10 PN start 1/27)     PT Start Time  0801    PT Stop Time  0845    PT Time Calculation (min)  44 min    Activity Tolerance  Patient tolerated treatment well    Behavior During Therapy  [facility] for tasks assessed/performed       Past Medical History:  Diagnosis Date  . Allergic rhinitis, cause unspecified   . Aortic insufficiency    s/p AVR 2015  . Aortic root enlargement (HCC)    thoracic aorta  . Aortic valve stenosis 2015   s/p AVR 2015 [name]) - completed cardiac rehab 08/2014  . Arthritis   . Asthma   . Atrial fibrillation ([place]) 07/23/2014  . Benign neoplasm of colon   . Gallstones    by CT  . History of kidney stones   . History of nephrolithiasis   . HLD (hyperlipidemia)   . Hypertension   . Need for prophylactic antibiotic   . Obstructive sleep apnea (adult) (pediatric)    pt. doesn't use his machine at home.  . Other testicular hypofunction   . Pulmonary nodule, left 2015   2cm, ?sarcoid by biopsy  . Unspecified glaucoma(365.9)     Past Surgical History:  Procedure Laterality Date  . AORTIC VALVE REPLACEMENT N/A 07/02/2014   Procedure: AORTIC VALVE REPLACEMENT (AVR) with 23 Aortic Magna Ease;  Surgeon: Grace Isaac, MD  . ASCENDING AORTIC ROOT REPLACEMENT N/A 07/02/2014   Procedure: supra coronary  ASCENDING AORTIC REPLACEMENT to the Inominate Artery with 55m Hemashield Platinum with  circulatory arrest;  Surgeon: EGrace Isaac MD  . [place]   . CARDIAC  VALVE REPLACEMENT    . CATARACT EXTRACTION Bilateral   . COLONOSCOPY  2013   WNL, rpt 5 yrs ([name]  . COLONOSCOPY  2018   1 TA, diverticulosis, rpt 5 yrs ([name]  . CORONARY ARTERY BYPASS GRAFT    . EYE SURGERY    . INTRAOPERATIVE TRANSESOPHAGEAL ECHOCARDIOGRAM N/A 07/02/2014  . JOINT REPLACEMENT Right 14   partial replacement  . KNEE SURGERY Right 05/2012   arthroscopic  . KNEE SURGERY Right 12/2013   patella  . LEFT AND RIGHT HEART CATHETERIZATION WITH CORONARY ANGIOGRAM N/A 05/22/2014   no significant obstructive CAD ([place]  . LITHOTRIPSY    . LUNG BIOPSY Left 2015   by IR - benign  . MEDIAL PARTIAL KNEE REPLACEMENT Right 03/2013  . RHINOPLASTY  1980s  . VENTRAL HERNIA REPAIR  08/26/2015  . VENTRAL HERNIA REPAIR N/A 08/26/2015   Procedure: VENTRAL HERNIA REPAIR WITH MESH ADULT;  Surgeon: EGrace Isaac MD;  [place]  Service: Vascular;  Laterality: N/A;    There were no vitals filed for this visit.  Subjective Assessment - 10/25/18 0819    Subjective  Patient reports he had a good morning this morning warming up. Is feeling like he can start to return to softball now. No pain.  Pertinent History  Patient is a pleasant 73 year old male for right shoulder that began mid September..  Patient has had intermittent shoulder pain for months after he was lifting weights and felt a pop in his shoulder after an overhead movement. MRI imaging per physician note indicated a significant tear of supraspinatus and infraspinatus tendons as well as atrophy of infraspinatus muscle. Moderate tendinosis of intra articular portion of biceps tendon noted.  Patient regularly sees cardiology for aortic stenosis and thoracic aortic aneurysms s/p pericardial tissue AVR and aneurysm graft (2015). 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 He enjoyed softball and walking 40 minutes 3days/week.  Patient has been recommended to get a cortisone surgery and have physical therapy. Wants to  return to softball in march.     Limitations  Lifting;House hold activities;Other (comment)    How long can you sit comfortably?  n/a    How long can you stand comfortably?  n/a    How long can you walk comfortably?  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  No/denies       Manual:  ER/ IR PROM holds of 60 seconds, 10x. Bringing arm to 90 degrees abduction with elbow propped on PT knee for stabilization. Stability provided to [facility] for ER and inferior pressure applied for reduction of impingement with IR   Pectoral STM/trigger point to decrease muscle tissue tension and improve length for postural correction 4 minutes   Bicep STM/trigger point with movement (extension/flexion of elbow) x 3 minutes  TherEx: Prone ER with stabilization to [facility] joint with weighted ball (red ball), 10x Prone IR with stabilization to Gh joint with weighted ball (red ball) 10x  Prone IR to ER rotation to [facility] joint with weighted ball (red ball) 10x   Prone: scapular retractions in scarecrow position : 90 degrees abduction 10x 5 second hold    Softball:              Practice biomechanics of throw with softball from home . Tossing ball with various throwing patterns used in game setting, IR, ER with varying angles of elbow flexion and rotation of glenohumeral joint with therapist observing decreased compensatory hike of shoulder as ER increases, no pain. Performed 1x pre -tape : 3 minutes, 1x post tape 3 minutes, demonstrated improved biomechanics and force with tape.   K tape placement: R shoulder three strips, two for retraction, one for ER.   Back against wall:            YTB ER walk ups 5x, tactile cueing and verbal cueing for keeping elbow under wrist  Scapular retractions against wall for posture correction 6x, tactile assistance for reduction of shoulder hike.                             PT Education - 10/25/18 0819    Education Details  exercise technique, manual, throwing biomechanics    Person(s) Educated  Patient    Methods  Explanation;Demonstration;Tactile cues;Verbal cues    Comprehension  Verbalized understanding;Returned demonstration;Verbal cues required;Tactile cues required;Need further instruction       PT Short Term Goals - 10/13/18 0858      PT SHORT TERM GOAL #1   Title  Pt will be independent  with HEP in order to improve strength and decrease pain in order to improve pain-free function at home and work.    Baseline  12/20: HEP given 1/17: HEP compliant    Time  2    Period  Weeks    Status  Partially Met      PT SHORT TERM GOAL #2   Title  Pt will decrease worst pain as reported on NPRS  to 3/10  in order to demonstrate clinically significant reduction in pain.    Baseline  12/20: 5/10 1/17: 3/10     Time  2    Period  Weeks    Status  Achieved        PT Long Term Goals - 10/13/18 3810      PT LONG TERM GOAL #1   Title  Pt will decrease worst pain as reported on NPRS to 2/10 in order to demonstrate clinically significant reduction in pain.    Baseline  12/20: 5/10 1/17: 3/10     Time  4    Period  Weeks    Status  Partially Met    Target Date  11/10/18      PT LONG TERM GOAL #2   Title  Pt will increase strength of R shoulder to 4+/5 in order to demonstrate improvement in strength and function     Baseline  12/20: R gross 4-/5 with ER 2+/5 1/17: 4/5 with ER 2+/5    Time  4    Period  Weeks    Status  Partially Met    Target Date  11/10/18      PT LONG TERM GOAL #3   Title  Patient will return to softball and gym program with no limitations or compensations for return to PLOF.     Baseline  12/20: unable to throw ball, has to alter gym program 1/17: returning to gym program with limitations, today is first attempt at throwing ball     Time  4    Period  Weeks    Status  Partially  Met    Target Date  11/10/18      PT LONG TERM GOAL #4   Title  Pt will decrease quick DASH Sport score by at least 8%  to 29.5% in order to demonstrate clinically significant reduction in disability.    Baseline  12/20: 37.5% 1/17: 30%    Time  4    Period  Weeks    Status  Partially Met    Target Date  11/10/18      PT LONG TERM GOAL #5   Title  Patient will increase R shoulder ER in standing actively to neutral position without compensatory patterning for return to proper throwing position.     Baseline  12/20: -10 degrees 1/17: -9 degrees     Time  4    Period  Weeks    Status  On-going    Target Date  11/10/18            Plan - 10/25/18 0846    Clinical Impression Statement  Patient progressing towards return to baseline with increasing strength and posture. Use of K tape allows patient to perform interventions with improved biomechanics and power due to corrective bony alignment. Will continue to progress strength and postural correction for return to PLOF. Patient will continue to benefit from skilled therapeutic intervention to address deficits in strength, mobility, and pain for a return to his PLOF.    Rehab Potential  Excellent    Clinical Impairments Affecting Rehab Potential  (+) previous level of function, high pain tolerance (-) chronicity of R shoulder activation, concurent cardiac complications    PT Frequency  2x / week    PT Duration  4 weeks    PT Treatment/Interventions  ADLs/Self Care Home Management;Aquatic Therapy;Biofeedback;Cryotherapy;Electrical Stimulation;Iontophoresis 68m/ml Dexamethasone;Moist Heat;Traction;Ultrasound;Therapeutic exercise;Therapeutic activities;Functional mobility training;Neuromuscular re-education;Patient/family education;Manual techniques;Compression bandaging;Scar mobilization;Passive range of motion;Vasopneumatic Device;Taping;Splinting;Energy conservation;Dry needling    PT Next Visit Plan  throwing softball against wall, focus on  biomechanics of movement with decreasing compensatory shrug    PT Home Exercise Plan  see above    Consulted and Agree with Plan of Care  Patient       Patient will benefit from skilled therapeutic intervention in order to improve the following deficits and impairments:  Cardiopulmonary status limiting activity, Decreased activity tolerance, Decreased endurance, Decreased range of motion, Decreased strength, Hypermobility, Hypomobility, Increased fascial restricitons, Increased edema, Impaired flexibility, Impaired UE functional use, Postural dysfunction, Improper body mechanics, Pain  Visit Diagnosis: Acute pain of right shoulder  Stiffness of right shoulder, not elsewhere classified  Muscle weakness  Abnormal posture     Problem List Patient Active Problem List   Diagnosis Date Noted  . History of colonic polyps 02/18/2017  . Thrombosed external hemorrhoid 02/18/2017  . CAD (coronary artery disease) 10/17/2016  . Thoracic aortic atherosclerosis ([place] 10/17/2016  . Health maintenance examination 08/12/2015  . Advanced care planning/counseling discussion 08/05/2014  . Medicare annual wellness visit, subsequent 08/05/2014  . Prediabetes 08/05/2014  . [place] atrial fibrillation ([place] 07/23/2014  . S/P AVR (aortic valve replacement) and aortoplasty 07/23/2014  . Thoracic ascending aortic aneurysm ([place] 07/02/2014  . Sarcoidosis 04/26/2014  . Asthmatic bronchitis 04/24/2014  . Pulmonary nodule, left   . Gross hematuria 11/06/2013  . Essential hypertension 08/29/2013  . Aortic insufficiency   . Aortic valve stenosis, severe   . DJD (degenerative joint disease) 02/27/2013  . Knee pain 02/27/2013  . Wedge compression fracture of thoracic vertebra ([place] 02/27/2013  . TESTICULAR HYPOFUNCTION 09/08/2010  . Recurrent nephrolithiasis 08/23/2009  . 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 Glaucoma 08/22/2008  . HYPERCHOLESTEROLEMIA 12/18/2007  . Obstructive sleep apnea 12/18/2007  . ALLERGIC  RHINITIS 12/18/2007   MJanna Arch PT, DPT   10/25/2018, 8:47 AM  [facility] [address] [address]Phone: [phone]  Fax:  [phone] Name: Jeremy LIZERMRN: [id number] of Birth: 104-04-1946

## 2018-10-31 ENCOUNTER — Ambulatory Visit: Payer: Medicare HMO | Attending: Orthopedic Surgery

## 2018-10-31 DIAGNOSIS — M25611 Stiffness of right shoulder, not elsewhere classified: Secondary | ICD-10-CM

## 2018-10-31 DIAGNOSIS — M6281 Muscle weakness (generalized): Secondary | ICD-10-CM | POA: Insufficient documentation

## 2018-10-31 DIAGNOSIS — M25511 Pain in right shoulder: Secondary | ICD-10-CM | POA: Insufficient documentation

## 2018-10-31 DIAGNOSIS — R293 Abnormal posture: Secondary | ICD-10-CM | POA: Diagnosis not present

## 2018-10-31 NOTE — Therapy (Addendum)
[place] MAIN [facility] SERVICES [address] [place], [place], [address] Phone: [phone]   Fax:  [phone]  Physical Therapy Treatment  Patient Details  Name: Jeremy Meadows MRN: [id number] Date of Birth: 05/22/1946 Referring Provider (PT): Dr. Vonna Drafts   Encounter Date: 10/31/2018  PT End of Session - 10/31/18 0758    Visit Number  12    Number of Visits  16    Date for PT Re-Evaluation  11/10/18    Authorization Type   2/10 PN start 1/27)     PT Start Time  0802    PT Stop Time  0842    PT Time Calculation (min)  40 min    Activity Tolerance  Patient tolerated treatment well    Behavior During Therapy  [facility] for tasks assessed/performed       Past Medical History:  Diagnosis Date  . Allergic rhinitis, cause unspecified   . Aortic insufficiency    s/p AVR 2015  . Aortic root enlargement (HCC)    thoracic aorta  . Aortic valve stenosis 2015   s/p AVR 2015 [name]) - completed cardiac rehab 08/2014  . Arthritis   . Asthma   . Atrial fibrillation ([place]) 07/23/2014  . Benign neoplasm of colon   . Gallstones    by CT  . History of kidney stones   . History of nephrolithiasis   . HLD (hyperlipidemia)   . Hypertension   . Need for prophylactic antibiotic   . Obstructive sleep apnea (adult) (pediatric)    pt. doesn't use his machine at home.  . Other testicular hypofunction   . Pulmonary nodule, left 2015   2cm, ?sarcoid by biopsy  . Unspecified glaucoma(365.9)     Past Surgical History:  Procedure Laterality Date  . AORTIC VALVE REPLACEMENT N/A 07/02/2014   Procedure: AORTIC VALVE REPLACEMENT (AVR) with 23 Aortic Magna Ease;  Surgeon: Grace Isaac, MD  . ASCENDING AORTIC ROOT REPLACEMENT N/A 07/02/2014   Procedure: supra coronary  ASCENDING AORTIC REPLACEMENT to the Inominate Artery with 6m Hemashield Platinum with  circulatory arrest;  Surgeon: EGrace Isaac MD  . [place]   . CARDIAC  VALVE REPLACEMENT    . CATARACT EXTRACTION Bilateral   . COLONOSCOPY  2013   WNL, rpt 5 yrs ([name]  . COLONOSCOPY  2018   1 TA, diverticulosis, rpt 5 yrs ([name]  . CORONARY ARTERY BYPASS GRAFT    . EYE SURGERY    . INTRAOPERATIVE TRANSESOPHAGEAL ECHOCARDIOGRAM N/A 07/02/2014  . JOINT REPLACEMENT Right 14   partial replacement  . KNEE SURGERY Right 05/2012   arthroscopic  . KNEE SURGERY Right 12/2013   patella  . LEFT AND RIGHT HEART CATHETERIZATION WITH CORONARY ANGIOGRAM N/A 05/22/2014   no significant obstructive CAD ([place]  . LITHOTRIPSY    . LUNG BIOPSY Left 2015   by IR - benign  . MEDIAL PARTIAL KNEE REPLACEMENT Right 03/2013  . RHINOPLASTY  1980s  . VENTRAL HERNIA REPAIR  08/26/2015  . VENTRAL HERNIA REPAIR N/A 08/26/2015   Procedure: VENTRAL HERNIA REPAIR WITH MESH ADULT;  Surgeon: EGrace Isaac MD;  [place]  Service: Vascular;  Laterality: N/A;    There were no vitals filed for this visit.  Subjective Assessment - 10/31/18 0848    Subjective  Patient reports that over the weekend, he was able to throw a softball for about 30 minutes total at a distance of about 90-100 feet without any  tape applied. The patient notes that he did not have any shoulder pain during or following the throwing session. However, he does report that he does still experience the intermittent elbow/tricep discomfort (but did not during or following throwing session). Patient reports compliance and consistent performance of HEP.    Pertinent History  Patient is a pleasant 73 year old male for right shoulder that began mid September..  Patient has had intermittent shoulder pain for months after he was lifting weights and felt a pop in his shoulder after an overhead movement. MRI imaging per physician note indicated a significant tear of supraspinatus and infraspinatus tendons as well as atrophy of infraspinatus muscle. Moderate tendinosis of intra articular portion of biceps tendon noted.   Patient regularly sees cardiology for aortic stenosis and thoracic aortic aneurysms s/p pericardial tissue AVR and aneurysm graft (2015). 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 He enjoyed softball and walking 40 minutes 3days/week.  Patient has been recommended to get a cortisone surgery and have physical therapy. Wants to return to softball in march.     Limitations  Lifting;House hold activities;Other (comment)    How long can you sit comfortably?  n/a    How long can you stand comfortably?  n/a    How long can you walk comfortably?  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  No/denies       Manual:   Supine ER/ IR PROM holds of 60 seconds, 10x. Bringing arm to 90 degrees abduction with elbow propped on PT knee for stabilization. Stability provided to [facility] for ER and inferior pressure applied for reduction of impingement with IR   Pectoral STM/trigger point to decrease muscle tissue tension and improve length for postural correction 3 minutes  Bicep STM/trigger point with movement (extension/flexion of elbow and forearm pronation/supination) x 3 minutes  Tricep STM/trigger point with movement (extension/flexion of elbow) x2 minutes  SPT provides prolonged stretch to long head of triceps x45 seconds x 1 trial    TherEx:  Prone: scapular retractions with weighted ball (red ball) in R hand in scarecrow position: 90 degrees abduction 1 set x10 without hold and 1 set x10 with 3 second hold; patient requires tactile cueing and intermittent verbal cues to keep shoulder at 90 degrees of abduction  Standing ER/IR through full ROM with 90 deg abduction with stabilization to [facility] joint with weighted ball (red ball) x5: patient indicates mild shoulder pain/discomfort during performance with red ball --> regressed to same exercise with use of  softball 2 sets x10; patient indicates that he feels the shoulder muscles working but denies pain   Back against wall:            -ER walk up x1 without TB; patient demonstrates ability to keep elbow under wrist without verbal or tactile cueing --> YTB ER walk ups x3, patient displays slight fatigue in RUE as demonstrated by slight deviation of wrist falling inward: during performance of third repetition, patient ceases the exercise at approximately 160 degrees of shoulder flexion and states that he felt a 'pop' in his clavicle (points to proximal sternoclavicular joint) but denies pain. Full motion with no pain after "pop"   Following 'popping' sensation in shoulder, manual therapy performed for assessment and correction of muscle tissue and bony alignment  Proximal and distal superior-anterior clavicular glides grade I-III x10; during performance, patient reports that he feels a pop, but does not report pain or discomfort; following performance, patient demonstrates decreased muscle tension surrounding clavicle  Inferior first rib glide grade II x10; patient does not report pain or discomfort          PT Education - 10/31/18 0757    Education Details  throwing biomechanics, manual therapy, exercise technique    Person(s) Educated  Patient    Methods  Explanation;Demonstration;Tactile cues;Verbal cues    Comprehension  Verbalized understanding;Returned demonstration;Verbal cues required;Tactile cues required;Need further instruction       PT Short Term Goals - 10/13/18 0858      PT SHORT TERM GOAL #1   Title  Pt will be independent with HEP in order to improve strength and decrease pain in order to improve pain-free function at home and work.    Baseline  12/20: HEP given 1/17: HEP compliant    Time  2    Period  Weeks    Status  Partially Met      PT SHORT TERM GOAL #2   Title  Pt will decrease worst pain as reported on NPRS  to 3/10  in order to demonstrate clinically  significant reduction in pain.    Baseline  12/20: 5/10 1/17: 3/10     Time  2    Period  Weeks    Status  Achieved        PT Long Term Goals - 10/13/18 5366      PT LONG TERM GOAL #1   Title  Pt will decrease worst pain as reported on NPRS to 2/10 in order to demonstrate clinically significant reduction in pain.    Baseline  12/20: 5/10 1/17: 3/10     Time  4    Period  Weeks    Status  Partially Met    Target Date  11/10/18      PT LONG TERM GOAL #2   Title  Pt will increase strength of R shoulder to 4+/5 in order to demonstrate improvement in strength and function     Baseline  12/20: R gross 4-/5 with ER 2+/5 1/17: 4/5 with ER 2+/5    Time  4    Period  Weeks    Status  Partially Met    Target Date  11/10/18      PT LONG TERM GOAL #3   Title  Patient will return to softball and gym program with no limitations or compensations for return to PLOF.     Baseline  12/20: unable to throw ball, has to alter gym program 1/17: returning to gym program with limitations, today is first attempt at throwing ball     Time  4    Period  Weeks    Status  Partially Met    Target Date  11/10/18      PT LONG TERM GOAL #4   Title  Pt will decrease quick DASH Sport score by at least 8%  to 29.5% in order to demonstrate clinically significant reduction in disability.    Baseline  12/20: 37.5% 1/17: 30%    Time  4    Period  Weeks    Status  Partially Met    Target Date  11/10/18      PT LONG TERM GOAL #5   Title  Patient will increase R shoulder ER in standing actively to neutral position without compensatory patterning for  return to proper throwing position.     Baseline  12/20: -10 degrees 1/17: -9 degrees     Time  4    Period  Weeks    Status  On-going    Target Date  11/10/18            Plan - 10/31/18 0912    Clinical Impression Statement  The patient continues to progress towards his baseline level of function in terms of shoulder strength, and also continues to  improve his posture. The patient is also progressing well towards a return to softball activities and throwing as is evidenced by his self-reported short-toss throwing session over the weekend. Although the patient experienced a 'popping' sensation in his proximal clavicle during the performance of exercise this session, this is quite common during rehabilitation of individuals with a history of forward head rounded shoulders posture, and manual therapy today (proximal and distal clavicular glides and rotation) assisted with repositioning and realigning the clavicle. The patient will continue to benefit from skilled PT in order to work towards goals, to continue to improve strength and posture, and to return to prior level of function and sport activity.    Rehab Potential  Excellent    Clinical Impairments Affecting Rehab Potential  (+) previous level of function, high pain tolerance (-) chronicity of R shoulder activation, concurent cardiac complications    PT Frequency  2x / week    PT Duration  4 weeks    PT Treatment/Interventions  ADLs/Self Care Home Management;Aquatic Therapy;Biofeedback;Cryotherapy;Electrical Stimulation;Iontophoresis 69m/ml Dexamethasone;Moist Heat;Traction;Ultrasound;Therapeutic exercise;Therapeutic activities;Functional mobility training;Neuromuscular re-education;Patient/family education;Manual techniques;Compression bandaging;Scar mobilization;Passive range of motion;Vasopneumatic Device;Taping;Splinting;Energy conservation;Dry needling    PT Next Visit Plan  throwing softball against wall, shoulder abduction strength, manual therapy, shoulder ER/IR with weighted ball/against resistance    PT Home Exercise Plan  see above    Consulted and Agree with Plan of Care  Patient       Patient will benefit from skilled therapeutic intervention in order to improve the following deficits and impairments:  Cardiopulmonary status limiting activity, Decreased activity tolerance, Decreased  endurance, Decreased range of motion, Decreased strength, Hypermobility, Hypomobility, Increased fascial restricitons, Increased edema, Impaired flexibility, Impaired UE functional use, Postural dysfunction, Improper body mechanics, Pain  Visit Diagnosis: Acute pain of right shoulder  Stiffness of right shoulder, not elsewhere classified  Muscle weakness  Abnormal posture     Problem List Patient Active Problem List   Diagnosis Date Noted  . History of colonic polyps 02/18/2017  . Thrombosed external hemorrhoid 02/18/2017  . CAD (coronary artery disease) 10/17/2016  . Thoracic aortic atherosclerosis ([place] 10/17/2016  . Health maintenance examination 08/12/2015  . Advanced care planning/counseling discussion 08/05/2014  . Medicare annual wellness visit, subsequent 08/05/2014  . Prediabetes 08/05/2014  . [place] atrial fibrillation ([place] 07/23/2014  . S/P AVR (aortic valve replacement) and aortoplasty 07/23/2014  . Thoracic ascending aortic aneurysm ([place] 07/02/2014  . Sarcoidosis 04/26/2014  . Asthmatic bronchitis 04/24/2014  . Pulmonary nodule, left   . Gross hematuria 11/06/2013  . Essential hypertension 08/29/2013  . Aortic insufficiency   . Aortic valve stenosis, severe   . DJD (degenerative joint disease) 02/27/2013  . Knee pain 02/27/2013  . Wedge compression fracture of thoracic vertebra ([place] 02/27/2013  . TESTICULAR HYPOFUNCTION 09/08/2010  . Recurrent nephrolithiasis 08/23/2009  . 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 Glaucoma 08/22/2008  . HYPERCHOLESTEROLEMIA 12/18/2007  . Obstructive sleep apnea 12/18/2007  . ALLERGIC RHINITIS 12/18/2007   ZOrlean Patten SPT  This entire session was  performed under direct supervision and direction of a licensed Chiropractor . I have personally read, edited and approve of the note as written.  Janna Arch, PT, DPT    10/31/2018, 9:28 AM  [place] MAIN [facility] SERVICES [address] [place], [place], [address] Phone: [phone]   Fax:  [phone]  Name: Jeremy Meadows MRN: [id number] Date of Birth: 09/19/1946

## 2018-10-31 NOTE — Therapy (Deleted)
[place] MAIN [facility] SERVICES [address] [place], [place], [address] Phone: [phone]   Fax:  [phone]  Physical Therapy Treatment  Patient Details  Name: Jeremy Meadows MRN: [id number] Date of Birth: 09/07/1946 Referring Provider (PT): Dr. Vonna Drafts   Encounter Date: 10/31/2018  PT End of Session - 10/31/18 0758    Visit Number  12    Number of Visits  16    Date for PT Re-Evaluation  11/10/18    Authorization Type   2/10 PN start 1/27)     PT Start Time  0802    PT Stop Time  0844    PT Time Calculation (min)  42 min    Activity Tolerance  Patient tolerated treatment well    Behavior During Therapy  [facility] for tasks assessed/performed       Past Medical History:  Diagnosis Date  . Allergic rhinitis, cause unspecified   . Aortic insufficiency    s/p AVR 2015  . Aortic root enlargement (HCC)    thoracic aorta  . Aortic valve stenosis 2015   s/p AVR 2015 [name]) - completed cardiac rehab 08/2014  . Arthritis   . Asthma   . Atrial fibrillation ([place]) 07/23/2014  . Benign neoplasm of colon   . Gallstones    by CT  . History of kidney stones   . History of nephrolithiasis   . HLD (hyperlipidemia)   . Hypertension   . Need for prophylactic antibiotic   . Obstructive sleep apnea (adult) (pediatric)    pt. doesn't use his machine at home.  . Other testicular hypofunction   . Pulmonary nodule, left 2015   2cm, ?sarcoid by biopsy  . Unspecified glaucoma(365.9)     Past Surgical History:  Procedure Laterality Date  . AORTIC VALVE REPLACEMENT N/A 07/02/2014   Procedure: AORTIC VALVE REPLACEMENT (AVR) with 23 Aortic Magna Ease;  Surgeon: Grace Isaac, MD  . ASCENDING AORTIC ROOT REPLACEMENT N/A 07/02/2014   Procedure: supra coronary  ASCENDING AORTIC REPLACEMENT to the Inominate Artery with 55m Hemashield Platinum with  circulatory arrest;  Surgeon: EGrace Isaac MD  . [place]   . CARDIAC  VALVE REPLACEMENT    . CATARACT EXTRACTION Bilateral   . COLONOSCOPY  2013   WNL, rpt 5 yrs ([name]  . COLONOSCOPY  2018   1 TA, diverticulosis, rpt 5 yrs ([name]  . CORONARY ARTERY BYPASS GRAFT    . EYE SURGERY    . INTRAOPERATIVE TRANSESOPHAGEAL ECHOCARDIOGRAM N/A 07/02/2014  . JOINT REPLACEMENT Right 14   partial replacement  . KNEE SURGERY Right 05/2012   arthroscopic  . KNEE SURGERY Right 12/2013   patella  . LEFT AND RIGHT HEART CATHETERIZATION WITH CORONARY ANGIOGRAM N/A 05/22/2014   no significant obstructive CAD ([place]  . LITHOTRIPSY    . LUNG BIOPSY Left 2015   by IR - benign  . MEDIAL PARTIAL KNEE REPLACEMENT Right 03/2013  . RHINOPLASTY  1980s  . VENTRAL HERNIA REPAIR  08/26/2015  . VENTRAL HERNIA REPAIR N/A 08/26/2015   Procedure: VENTRAL HERNIA REPAIR WITH MESH ADULT;  Surgeon: EGrace Isaac MD;  [place]  Service: Vascular;  Laterality: N/A;    There were no vitals filed for this visit.  Threw for about 30 minutes 10 minutes 90-100 feet; elbow pain   Manual:   Supine ER/ IR PROM holds of 60 seconds, 10x. Bringing arm to 90 degrees abduction with elbow propped on PT knee  for stabilization. Stability provided to [facility] for ER and inferior pressure applied for reduction of impingement with IR   Pectoral STM/trigger point to decrease muscle tissue tension and improve length for postural correction 4 minutes Bicep STM/trigger point with movement (extension/flexion of elbow) x 3 minutes tricep soft tissue  Upper trap stretch with overpressure at [facility] joint x 30 seconds SCM stretch seated with overpressure at [facility] joint and occipital lobe x 30 seconds Levator stretch??  Proximal and distal superior clavicular glides grade II x10 Inferior first rib glide grade II x10   TherEx: Prone: scapular retractions with weighted ball (red ball) in scarecrow position: 90 degrees abduction 10x 5 second hold   Standing ER/IR through full ROM with 90 deg abduction with  stabilization to [facility] joint with or without weighted ball (red ball), 10x    Wall push up-PLUS with band (?)  Seated/standing shoulder abduction with weighted (ball or dumbbell)   Back against wall:            -YTB ER walk ups 5x, tactile cueing and verbal cueing for keeping elbow under wrist           -Scapular retractions against wall for posture correction 6x, tactile assistance for reduction of shoulder hike.     Softball:              Practice biomechanics of throw with softball from home . Tossing ball with various throwing patterns used in game setting, IR, ER with varying angles of elbow flexion and rotation of glenohumeral joint with therapist observing decreased compensatory hike of shoulder as ER increases, no pain. Performed 1x pre -tape : 3 minutes, 1x post tape 3 minutes, demonstrated improved biomechanics and force with tape.    Weighted ball (red) short toss ??  K tape placement: R shoulder three strips, two for retraction, one for ER. ??  Quadruped ball rolls x10 each side                         PT Education - 10/31/18 0757    Education Details  throwing biomechanics, manual therapy, exercise technique    Person(s) Educated  Patient    Methods  Explanation;Demonstration;Tactile cues;Verbal cues    Comprehension  Verbalized understanding;Returned demonstration;Verbal cues required;Tactile cues required;Need further instruction       PT Short Term Goals - 10/13/18 0858      PT SHORT TERM GOAL #1   Title  Pt will be independent with HEP in order to improve strength and decrease pain in order to improve pain-free function at home and work.    Baseline  12/20: HEP given 1/17: HEP compliant    Time  2    Period  Weeks    Status  Partially Met      PT SHORT TERM GOAL #2   Title  Pt will decrease worst pain as reported on NPRS  to 3/10  in order to demonstrate clinically significant reduction in pain.    Baseline  12/20: 5/10 1/17: 3/10     Time   2    Period  Weeks    Status  Achieved        PT Long Term Goals - 10/13/18 0932      PT LONG TERM GOAL #1   Title  Pt will decrease worst pain as reported on NPRS to 2/10 in order to demonstrate clinically significant reduction in pain.    Baseline  12/20: 5/10 1/17:  3/10     Time  4    Period  Weeks    Status  Partially Met    Target Date  11/10/18      PT LONG TERM GOAL #2   Title  Pt will increase strength of R shoulder to 4+/5 in order to demonstrate improvement in strength and function     Baseline  12/20: R gross 4-/5 with ER 2+/5 1/17: 4/5 with ER 2+/5    Time  4    Period  Weeks    Status  Partially Met    Target Date  11/10/18      PT LONG TERM GOAL #3   Title  Patient will return to softball and gym program with no limitations or compensations for return to PLOF.     Baseline  12/20: unable to throw ball, has to alter gym program 1/17: returning to gym program with limitations, today is first attempt at throwing ball     Time  4    Period  Weeks    Status  Partially Met    Target Date  11/10/18      PT LONG TERM GOAL #4   Title  Pt will decrease quick DASH Sport score by at least 8%  to 29.5% in order to demonstrate clinically significant reduction in disability.    Baseline  12/20: 37.5% 1/17: 30%    Time  4    Period  Weeks    Status  Partially Met    Target Date  11/10/18      PT LONG TERM GOAL #5   Title  Patient will increase R shoulder ER in standing actively to neutral position without compensatory patterning for return to proper throwing position.     Baseline  12/20: -10 degrees 1/17: -9 degrees     Time  4    Period  Weeks    Status  On-going    Target Date  11/10/18              Patient will benefit from skilled therapeutic intervention in order to improve the following deficits and impairments:     Visit Diagnosis: No diagnosis found.     Problem List Patient Active Problem List   Diagnosis Date Noted  . History of colonic  polyps 02/18/2017  . Thrombosed external hemorrhoid 02/18/2017  . CAD (coronary artery disease) 10/17/2016  . Thoracic aortic atherosclerosis ([place]) 10/17/2016  . Health maintenance examination 08/12/2015  . Advanced care planning/counseling discussion 08/05/2014  . Medicare annual wellness visit, subsequent 08/05/2014  . Prediabetes 08/05/2014  . Lone atrial fibrillation ([place]) 07/23/2014  . S/P AVR (aortic valve replacement) and aortoplasty 07/23/2014  . Thoracic ascending aortic aneurysm ([place]) 07/02/2014  . Sarcoidosis 04/26/2014  . Asthmatic bronchitis 04/24/2014  . Pulmonary nodule, left   . Gross hematuria 11/06/2013  . Essential hypertension 08/29/2013  . Aortic insufficiency   . Aortic valve stenosis, severe   . DJD (degenerative joint disease) 02/27/2013  . Knee pain 02/27/2013  . Wedge compression fracture of thoracic vertebra ([place]) 02/27/2013  . TESTICULAR HYPOFUNCTION 09/08/2010  . Recurrent nephrolithiasis 08/23/2009  . 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 Glaucoma 08/22/2008  . HYPERCHOLESTEROLEMIA 12/18/2007  . Obstructive sleep apnea 12/18/2007  . ALLERGIC RHINITIS 12/18/2007    Orlean Patten 10/31/2018, 7:59 AM  [place] MAIN [facility] SERVICES [address] [place], [place], [address] Phone: [phone]   Fax:  [phone]  Name: Jeremy Meadows MRN: [id number] Date  of Birth: 06-Nov-1945

## 2018-11-03 ENCOUNTER — Ambulatory Visit: Payer: Medicare HMO

## 2018-11-06 ENCOUNTER — Ambulatory Visit: Payer: Medicare HMO

## 2018-11-06 DIAGNOSIS — R293 Abnormal posture: Secondary | ICD-10-CM | POA: Diagnosis not present

## 2018-11-06 DIAGNOSIS — M6281 Muscle weakness (generalized): Secondary | ICD-10-CM

## 2018-11-06 DIAGNOSIS — M25511 Pain in right shoulder: Secondary | ICD-10-CM

## 2018-11-06 DIAGNOSIS — M25611 Stiffness of right shoulder, not elsewhere classified: Secondary | ICD-10-CM | POA: Diagnosis not present

## 2018-11-06 NOTE — Therapy (Signed)
[place] MAIN [facility] SERVICES [address] [place], [place], [address] Phone: [phone]   Fax:  [phone]  Physical Therapy Treatment  Patient Details  Name: Jeremy Meadows MRN: [id number] Date of Birth: 11-26-1945 Referring Provider (PT): Dr. Vonna Drafts   Encounter Date: 11/06/2018    Past Medical History:  Diagnosis Date  . Allergic rhinitis, cause unspecified   . Aortic insufficiency    s/p AVR 2015  . Aortic root enlargement (HCC)    thoracic aorta  . Aortic valve stenosis 2015   s/p AVR 2015 [name]) - completed cardiac rehab 08/2014  . Arthritis   . Asthma   . Atrial fibrillation ([place]) 07/23/2014  . Benign neoplasm of colon   . Gallstones    by CT  . History of kidney stones   . History of nephrolithiasis   . HLD (hyperlipidemia)   . Hypertension   . Need for prophylactic antibiotic   . Obstructive sleep apnea (adult) (pediatric)    pt. doesn't use his machine at home.  . Other testicular hypofunction   . Pulmonary nodule, left 2015   2cm, ?sarcoid by biopsy  . Unspecified glaucoma(365.9)     Past Surgical History:  Procedure Laterality Date  . AORTIC VALVE REPLACEMENT N/A 07/02/2014   Procedure: AORTIC VALVE REPLACEMENT (AVR) with 23 Aortic Magna Ease;  Surgeon: Grace Isaac, MD  . ASCENDING AORTIC ROOT REPLACEMENT N/A 07/02/2014   Procedure: supra coronary  ASCENDING AORTIC REPLACEMENT to the Inominate Artery with 59m Hemashield Platinum with  circulatory arrest;  Surgeon: EGrace Isaac MD  . [place]   . CARDIAC VALVE REPLACEMENT    . CATARACT EXTRACTION Bilateral   . COLONOSCOPY  2013   WNL, rpt 5 yrs ([name]  . COLONOSCOPY  2018   1 TA, diverticulosis, rpt 5 yrs ([name]  . CORONARY ARTERY BYPASS GRAFT    . EYE SURGERY    . INTRAOPERATIVE TRANSESOPHAGEAL ECHOCARDIOGRAM N/A 07/02/2014  . JOINT REPLACEMENT Right 14   partial replacement  . KNEE SURGERY Right 05/2012   arthroscopic  . KNEE SURGERY Right 12/2013   patella  . LEFT AND RIGHT HEART CATHETERIZATION WITH CORONARY ANGIOGRAM N/A 05/22/2014   no significant obstructive CAD ([place]  . LITHOTRIPSY    . LUNG BIOPSY Left 2015   by IR - benign  . MEDIAL PARTIAL KNEE REPLACEMENT Right 03/2013  . RHINOPLASTY  1980s  . VENTRAL HERNIA REPAIR  08/26/2015  . VENTRAL HERNIA REPAIR N/A 08/26/2015   Procedure: VENTRAL HERNIA REPAIR WITH MESH ADULT;  Surgeon: EGrace Isaac MD;  [place]  Service: Vascular;  Laterality: N/A;    There were no vitals filed for this visit.    Manual:  ER/ IR PROM holds of 60 seconds, 10x. Bringing arm to 90 degrees abduction with elbow propped on PT knee for stabilization. Stability provided to G[facility] ER and inferior pressure applied for reduction of impingement with IR   Pectoral STM/trigger point to decrease muscle tissue tension and improve length for postural correction 2 minutes   Bicep STM/trigger point with movement (extension/flexion of elbow) x 2 minutes  Upper trap stretch 60 seconds with R arm propped on PT knee, 3x 30 second holds in each direction.    TherEx: Quadruped: scapular retraction and protraction (plus) with Min A to R elbow to maintain extension/stabilization.    Weighted ball (3000 Gr)  Bilateral UE raise with focus on keeping scapular retracted 10x with 3  seconds raise and 6 second decline for eccentric focus on stability  PNF pattern (woodchop) seated 10x each direction  Body blade:  Overhead straight arm RUE 30 seconds, 125 degrees 30 seconds, 90 degrees 30 seconds.    hands on knees bring to waist with focus on scapular retractions x 10x  Hands on knees hands behind head reaching towards neck for increasing internal rotation 10x 5 second holds  Seated swiss ball forward rollouts 10x   Back against wall:           ER, IR with overpressure at shoulder for stabilization 10x   Softball:              Practice biomechanics of  throw with softball from home . Tossing ball with various throwing patterns used in game setting, IR, ER with varying angles of elbow flexion and rotation of glenohumeral joint with therapist observing decreased compensatory hike of shoulder as ER increases, no pain. 5 minutes, demonstrated improved biomechanics and force with tape.    K tape placement: R shoulder three strips, two for retraction, one for ER.                          PT Short Term Goals - 10/13/18 0858      PT SHORT TERM GOAL #1   Title  Pt will be independent with HEP in order to improve strength and decrease pain in order to improve pain-free function at home and work.    Baseline  12/20: HEP given 1/17: HEP compliant    Time  2    Period  Weeks    Status  Partially Met      PT SHORT TERM GOAL #2   Title  Pt will decrease worst pain as reported on NPRS  to 3/10  in order to demonstrate clinically significant reduction in pain.    Baseline  12/20: 5/10 1/17: 3/10     Time  2    Period  Weeks    Status  Achieved        PT Long Term Goals - 10/13/18 3953      PT LONG TERM GOAL #1   Title  Pt will decrease worst pain as reported on NPRS to 2/10 in order to demonstrate clinically significant reduction in pain.    Baseline  12/20: 5/10 1/17: 3/10     Time  4    Period  Weeks    Status  Partially Met    Target Date  11/10/18      PT LONG TERM GOAL #2   Title  Pt will increase strength of R shoulder to 4+/5 in order to demonstrate improvement in strength and function     Baseline  12/20: R gross 4-/5 with ER 2+/5 1/17: 4/5 with ER 2+/5    Time  4    Period  Weeks    Status  Partially Met    Target Date  11/10/18      PT LONG TERM GOAL #3   Title  Patient will return to softball and gym program with no limitations or compensations for return to PLOF.     Baseline  12/20: unable to throw ball, has to alter gym program 1/17: returning to gym program with limitations, today is first attempt at  throwing ball     Time  4    Period  Weeks    Status  Partially Met    Target Date  11/10/18      PT LONG TERM GOAL #4   Title  Pt will decrease quick DASH Sport score by at least 8%  to 29.5% in order to demonstrate clinically significant reduction in disability.    Baseline  12/20: 37.5% 1/17: 30%    Time  4    Period  Weeks    Status  Partially Met    Target Date  11/10/18      PT LONG TERM GOAL #5   Title  Patient will increase R shoulder ER in standing actively to neutral position without compensatory patterning for return to proper throwing position.     Baseline  12/20: -10 degrees 1/17: -9 degrees     Time  4    Period  Weeks    Status  On-going    Target Date  11/10/18              Patient will benefit from skilled therapeutic intervention in order to improve the following deficits and impairments:     Visit Diagnosis: No diagnosis found.     Problem List Patient Active Problem List   Diagnosis Date Noted  . History of colonic polyps 02/18/2017  . Thrombosed external hemorrhoid 02/18/2017  . CAD (coronary artery disease) 10/17/2016  . Thoracic aortic atherosclerosis ([place]) 10/17/2016  . Health maintenance examination 08/12/2015  . Advanced care planning/counseling discussion 08/05/2014  . Medicare annual wellness visit, subsequent 08/05/2014  . Prediabetes 08/05/2014  . 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 S/P AVR (aortic valve replacement) and aortoplasty 07/23/2014  . Thoracic ascending aortic aneurysm ([place]) 07/02/2014  . Sarcoidosis 04/26/2014  . Asthmatic bronchitis 04/24/2014  . Pulmonary nodule, left   . Gross hematuria 11/06/2013  . Essential hypertension 08/29/2013  . Aortic insufficiency   . Aortic valve stenosis, severe   . DJD (degenerative joint disease) 02/27/2013  . Knee pain 02/27/2013  . Wedge compression fracture of thoracic vertebra ([place]) 02/27/2013  . TESTICULAR HYPOFUNCTION 09/08/2010  . Recurrent nephrolithiasis  08/23/2009  . 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 Glaucoma 08/22/2008  . HYPERCHOLESTEROLEMIA 12/18/2007  . Obstructive sleep apnea 12/18/2007  . ALLERGIC RHINITIS 12/18/2007    Janna Arch, PT, DPT   11/06/2018, 7:59 AM  [place] MAIN [facility] SERVICES [address] [place], [place], [address] Phone: [phone]   Fax:  [phone]  Name: Jeremy Meadows MRN: [id number] Date of Birth: 03-31-1946

## 2018-11-13 ENCOUNTER — Ambulatory Visit: Payer: Medicare HMO

## 2018-11-13 DIAGNOSIS — R293 Abnormal posture: Secondary | ICD-10-CM | POA: Diagnosis not present

## 2018-11-13 DIAGNOSIS — M6281 Muscle weakness (generalized): Secondary | ICD-10-CM

## 2018-11-13 DIAGNOSIS — M25511 Pain in right shoulder: Secondary | ICD-10-CM

## 2018-11-13 DIAGNOSIS — M25611 Stiffness of right shoulder, not elsewhere classified: Secondary | ICD-10-CM | POA: Diagnosis not present

## 2018-11-13 NOTE — Therapy (Signed)
[place] MAIN [facility] SERVICES [address] [place], [place], [address] Phone: [phone]   Fax:  [phone]  Physical Therapy Treatment/Recertification  Patient Details  Name: Jeremy Meadows MRN: [id number] Date of Birth: 21-Jan-1946 Referring Provider (PT): Dr. Vonna Drafts   Encounter Date: 11/13/2018  PT End of Session - 11/13/18 0803    Visit Number  14    Number of Visits  16    Date for PT Re-Evaluation  12/11/18    Authorization Type   4/10 PN start 1/27     PT Start Time  0800    PT Stop Time  0845    PT Time Calculation (min)  45 min    Activity Tolerance  Patient tolerated treatment well    Behavior During Therapy  [facility] for tasks assessed/performed       Past Medical History:  Diagnosis Date  . Allergic rhinitis, cause unspecified   . Aortic insufficiency    s/p AVR 2015  . Aortic root enlargement (HCC)    thoracic aorta  . Aortic valve stenosis 2015   s/p AVR 2015 [name]) - completed cardiac rehab 08/2014  . Arthritis   . Asthma   . Atrial fibrillation ([place]) 07/23/2014  . Benign neoplasm of colon   . Gallstones    by CT  . History of kidney stones   . History of nephrolithiasis   . HLD (hyperlipidemia)   . Hypertension   . Need for prophylactic antibiotic   . Obstructive sleep apnea (adult) (pediatric)    pt. doesn't use his machine at home.  . Other testicular hypofunction   . Pulmonary nodule, left 2015   2cm, ?sarcoid by biopsy  . Unspecified glaucoma(365.9)     Past Surgical History:  Procedure Laterality Date  . AORTIC VALVE REPLACEMENT N/A 07/02/2014   Procedure: AORTIC VALVE REPLACEMENT (AVR) with 23 Aortic Magna Ease;  Surgeon: Grace Isaac, MD  . ASCENDING AORTIC ROOT REPLACEMENT N/A 07/02/2014   Procedure: supra coronary  ASCENDING AORTIC REPLACEMENT to the Inominate Artery with 34m Hemashield Platinum with  circulatory arrest;  Surgeon: EGrace Isaac MD  . [place]    . CARDIAC VALVE REPLACEMENT    . CATARACT EXTRACTION Bilateral   . COLONOSCOPY  2013   WNL, rpt 5 yrs ([name]  . COLONOSCOPY  2018   1 TA, diverticulosis, rpt 5 yrs ([name]  . CORONARY ARTERY BYPASS GRAFT    . EYE SURGERY    . INTRAOPERATIVE TRANSESOPHAGEAL ECHOCARDIOGRAM N/A 07/02/2014  . JOINT REPLACEMENT Right 14   partial replacement  . KNEE SURGERY Right 05/2012   arthroscopic  . KNEE SURGERY Right 12/2013   patella  . LEFT AND RIGHT HEART CATHETERIZATION WITH CORONARY ANGIOGRAM N/A 05/22/2014   no significant obstructive CAD ([place]  . LITHOTRIPSY    . LUNG BIOPSY Left 2015   by IR - benign  . MEDIAL PARTIAL KNEE REPLACEMENT Right 03/2013  . RHINOPLASTY  1980s  . VENTRAL HERNIA REPAIR  08/26/2015  . VENTRAL HERNIA REPAIR N/A 08/26/2015   Procedure: VENTRAL HERNIA REPAIR WITH MESH ADULT;  Surgeon: EGrace Isaac MD;  [place]  Service: Vascular;  Laterality: N/A;    There were no vitals filed for this visit.  Subjective Assessment - 11/13/18 0759    Subjective  Patient reports that he is doing well today. No shoulder pain upon arrival. He reports at least 90% improvement since starting therapy. He has tried throwing some  without any increase in his shoulder pain.    Pertinent History  Patient is a pleasant 73 year old male for right shoulder that began mid September..  Patient has had intermittent shoulder pain for months after he was lifting weights and felt a pop in his shoulder after an overhead movement. MRI imaging per physician note indicated a significant tear of supraspinatus and infraspinatus tendons as well as atrophy of infraspinatus muscle. Moderate tendinosis of intra articular portion of biceps tendon noted.  Patient regularly sees cardiology for aortic stenosis and thoracic aortic aneurysms s/p pericardial tissue AVR and aneurysm graft (2015). 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 He enjoyed softball and walking 40 minutes 3days/week.  Patient  has been recommended to get a cortisone surgery and have physical therapy. Wants to return to softball in march.     Limitations  Lifting;House hold activities;Other (comment)    How long can you sit comfortably?  n/a    How long can you stand comfortably?  n/a    How long can you walk comfortably?  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  No/denies         TREATMENT   Ther-ex  Updated outcome measures and goals with patient including: QuickDASH Sports Module: 0% (unbileld) R shoulder ER at 90 abduction (throwing position): 81 degrees; Warm-up on UBE 2 minutes forward/2 minutes backwards; Overhead softball throwing in hallway with therapist x 5 minutes; Reviewed and progressed partially through "Throwers Ten" program: Matrix R shoulder D2 flexion 2.5# x 10; Matrix R shoulder D2 extension 2.5# x 10; R shoulder ER at side with yellow tband x 10; Matrix R shoulder IR 2.5# x 10; R shoulder ER at 90 abduction with yellow tband x 10; R shoulder I's, Y's and T's to 90 degrees with 1# dumbell (DB) x 10 each; L sidelying R shoulder ER with 1# DB x 10 each;   Pt educated throughout session about proper posture and technique with exercises. Improved exercise technique, movement at target joints, use of target muscles after min to mod verbal, visual, tactile cues.    Pt is making excellent progress with therapy. He reports at least 90% improvement in symptoms since starting therapy. He reports 0% impairment on QuickDASH sports module at this time however his spring softball season has not started. He continues to report that his pain will get up to about a 3/10 when it is at its worst. Pt taken partially through "Throwers Ten" program today. Discussed how to incorporate some of the exercises into his regular off-season and in-season routine. Pt  encouraged to avoid all pain or overuse of R shoulder. He will continue to benefit from skilled PT services to address remaining deficits and return to softball without any increase in pain.                       PT Short Term Goals - 11/13/18 0804      PT SHORT TERM GOAL #1   Title  Pt will be independent with HEP in order to improve strength and decrease pain in order to improve pain-free function at home and work.    Baseline  12/20: HEP given 1/17: HEP compliant; 11/13/18: consistent with home program    Time  2  Period  Weeks    Status  On-going      PT SHORT TERM GOAL #2   Title  Pt will decrease worst pain as reported on NPRS  to 3/10  in order to demonstrate clinically significant reduction in pain.    Baseline  12/20: 5/10 1/17: 3/10     Time  2    Period  Weeks    Status  Achieved        PT Long Term Goals - 11/13/18 0805      PT LONG TERM GOAL #1   Title  Pt will decrease worst pain as reported on NPRS to 2/10 in order to demonstrate clinically significant reduction in pain.    Baseline  12/20: 5/10 1/17: 3/10; 11/13/18: 3/10    Time  4    Period  Weeks    Status  Partially Met    Target Date  12/11/18      PT LONG TERM GOAL #2   Title  Pt will increase strength of R shoulder to 4+/5 in order to demonstrate improvement in strength and function     Baseline  12/20: R gross 4-/5 with ER 2+/5 1/17: 4/5 with ER 2+/5    Time  4    Period  Weeks    Status  Deferred    Target Date  12/11/18      PT LONG TERM GOAL #3   Title  Patient will return to softball and gym program with no limitations or compensations for return to PLOF.     Baseline  12/20: unable to throw ball, has to alter gym program 1/17: returning to gym program with limitations, today is first attempt at throwing ball; 11/13/18: Pt has been out a few times to practice throwing without any increase in pain, minor compensations noted during throwing    Time  4    Period  Weeks    Status   Partially Met    Target Date  12/11/18      PT LONG TERM GOAL #4   Title  Pt will decrease quick DASH Sport score by at least 8%  to 29.5% in order to demonstrate clinically significant reduction in disability.    Baseline  12/20: 37.5% 1/17: 30%; 11/13/18: 0%    Time  4    Period  Weeks    Status  Achieved      PT LONG TERM GOAL #5   Title  Patient will increase R shoulder ER in standing actively to neutral position without compensatory patterning for return to proper throwing position.     Baseline  12/20: -10 degrees 1/17: -9 degrees; 11/13/18: -9 degrees    Time  4    Period  Weeks    Status  On-going    Target Date  12/11/18            Plan - 11/13/18 0803    Clinical Impression Statement  Pt is making excellent progress with therapy. He reports at least 90% improvement in symptoms since starting therapy. He reports 0% impairment on QuickDASH sports module at this time however his spring softball season has not started. He continues to report that his pain will get up to about a 3/10 when it is at its worst. Pt taken partially through "Throwers Ten" program today. Discussed how to incorporate some of the exercises into his regular off-season and in-season routine. Pt encouraged to avoid all pain or overuse of R shoulder. He will continue to  benefit from skilled PT services to address remaining deficits and return to softball without any increase in pain.      Rehab Potential  Excellent    Clinical Impairments Affecting Rehab Potential  (+) previous level of function, high pain tolerance (-) chronicity of R shoulder activation, concurent cardiac complications    PT Frequency  2x / week    PT Duration  4 weeks    PT Treatment/Interventions  ADLs/Self Care Home Management;Aquatic Therapy;Biofeedback;Cryotherapy;Electrical Stimulation;Iontophoresis 27m/ml Dexamethasone;Moist Heat;Traction;Ultrasound;Therapeutic exercise;Therapeutic activities;Functional mobility training;Neuromuscular  re-education;Patient/family education;Manual techniques;Compression bandaging;Scar mobilization;Passive range of motion;Vasopneumatic Device;Taping;Splinting;Energy conservation;Dry needling    PT Next Visit Plan  throwing softball against wall, shoulder abduction strength, manual therapy, shoulder ER/IR with weighted ball/against resistance    PT Home Exercise Plan  see above    Consulted and Agree with Plan of Care  Patient       Patient will benefit from skilled therapeutic intervention in order to improve the following deficits and impairments:  Cardiopulmonary status limiting activity, Decreased activity tolerance, Decreased endurance, Decreased range of motion, Decreased strength, Hypermobility, Hypomobility, Increased fascial restricitons, Increased edema, Impaired flexibility, Impaired UE functional use, Postural dysfunction, Improper body mechanics, Pain  Visit Diagnosis: Acute pain of right shoulder  Stiffness of right shoulder, not elsewhere classified  Muscle weakness     Problem List Patient Active Problem List   Diagnosis Date Noted  . History of colonic polyps 02/18/2017  . Thrombosed external hemorrhoid 02/18/2017  . CAD (coronary artery disease) 10/17/2016  . Thoracic aortic atherosclerosis ([place] 10/17/2016  . Health maintenance examination 08/12/2015  . Advanced care planning/counseling discussion 08/05/2014  . Medicare annual wellness visit, subsequent 08/05/2014  . Prediabetes 08/05/2014  . [place]one atrial fibrillation ([place] 07/23/2014  . S/P AVR (aortic valve replacement) and aortoplasty 07/23/2014  . Thoracic ascending aortic aneurysm ([place] 07/02/2014  . Sarcoidosis 04/26/2014  . Asthmatic bronchitis 04/24/2014  . Pulmonary nodule, left   . Gross hematuria 11/06/2013  . Essential hypertension 08/29/2013  . Aortic insufficiency   . Aortic valve stenosis, severe   . DJD (degenerative joint disease) 02/27/2013  . Knee pain 02/27/2013  . Wedge compression  fracture of thoracic vertebra ([place] 02/27/2013  . TESTICULAR HYPOFUNCTION 09/08/2010  . Recurrent nephrolithiasis 08/23/2009  . 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 Glaucoma 08/22/2008  . HYPERCHOLESTEROLEMIA 12/18/2007  . Obstructive sleep apnea 12/18/2007  . ALLERGIC RHINITIS 12/18/2007   JPhillips GroutPT, DPT, GCS  Huprich,Jason 11/13/2018, 10:00 AM  [place] [facility] [address] [address]Phone: [phone]  Fax:  [phone] Name: Jeremy TOUCHETMRN: [id number] of Birth: [birth year]

## 2018-11-20 ENCOUNTER — Ambulatory Visit: Payer: Medicare HMO

## 2018-11-20 DIAGNOSIS — M6281 Muscle weakness (generalized): Secondary | ICD-10-CM

## 2018-11-20 DIAGNOSIS — M25611 Stiffness of right shoulder, not elsewhere classified: Secondary | ICD-10-CM

## 2018-11-20 DIAGNOSIS — R293 Abnormal posture: Secondary | ICD-10-CM

## 2018-11-20 DIAGNOSIS — M25511 Pain in right shoulder: Secondary | ICD-10-CM

## 2018-11-20 NOTE — Therapy (Signed)
[place] MAIN [facility] SERVICES [address] [place], [place], [address] Phone: [phone]   Fax:  [phone]  Physical Therapy Treatment/ Discharge  Patient Details  Name: Jeremy Meadows MRN: [id number] Date of Birth: [birth year] Referring Provider (PT): Dr. Vonna Drafts   Encounter Date: 11/20/2018  PT End of Session - 11/20/18 0856    Visit Number  15    Number of Visits  24    Date for PT Re-Evaluation  12/11/18    Authorization Type   5/10 PN start 1/27     PT Start Time  0759    PT Stop Time  0844    PT Time Calculation (min)  45 min    Activity Tolerance  Patient tolerated treatment well    Behavior During Therapy  [facility] for tasks assessed/performed       Past Medical History:  Diagnosis Date  . Allergic rhinitis, cause unspecified   . Aortic insufficiency    s/p AVR 2015  . Aortic root enlargement (HCC)    thoracic aorta  . Aortic valve stenosis 2015   s/p AVR 2015 [name]) - completed cardiac rehab 08/2014  . Arthritis   . Asthma   . Atrial fibrillation ([place]) 07/23/2014  . Benign neoplasm of colon   . Gallstones    by CT  . History of kidney stones   . History of nephrolithiasis   . HLD (hyperlipidemia)   . Hypertension   . Need for prophylactic antibiotic   . Obstructive sleep apnea (adult) (pediatric)    pt. doesn't use his machine at home.  . Other testicular hypofunction   . Pulmonary nodule, left 2015   2cm, ?sarcoid by biopsy  . Unspecified glaucoma(365.9)     Past Surgical History:  Procedure Laterality Date  . AORTIC VALVE REPLACEMENT N/A 07/02/2014   Procedure: AORTIC VALVE REPLACEMENT (AVR) with 23 Aortic Magna Ease;  Surgeon: Grace Isaac, MD  . ASCENDING AORTIC ROOT REPLACEMENT N/A 07/02/2014   Procedure: supra coronary  ASCENDING AORTIC REPLACEMENT to the Inominate Artery with 31m Hemashield Platinum with  circulatory arrest;  Surgeon: EGrace Isaac MD  . [place]   .  CARDIAC VALVE REPLACEMENT    . CATARACT EXTRACTION Bilateral   . COLONOSCOPY  2013   WNL, rpt 5 yrs ([name]  . COLONOSCOPY  2018   1 TA, diverticulosis, rpt 5 yrs ([name]  . CORONARY ARTERY BYPASS GRAFT    . EYE SURGERY    . INTRAOPERATIVE TRANSESOPHAGEAL ECHOCARDIOGRAM N/A 07/02/2014  . JOINT REPLACEMENT Right 14   partial replacement  . KNEE SURGERY Right 05/2012   arthroscopic  . KNEE SURGERY Right 12/2013   patella  . LEFT AND RIGHT HEART CATHETERIZATION WITH CORONARY ANGIOGRAM N/A 05/22/2014   no significant obstructive CAD ([place]  . LITHOTRIPSY    . LUNG BIOPSY Left 2015   by IR - benign  . MEDIAL PARTIAL KNEE REPLACEMENT Right 03/2013  . RHINOPLASTY  1980s  . VENTRAL HERNIA REPAIR  08/26/2015  . VENTRAL HERNIA REPAIR N/A 08/26/2015   Procedure: VENTRAL HERNIA REPAIR WITH MESH ADULT;  Surgeon: EGrace Isaac MD;  Location: [facility]  Service: Vascular;  Laterality: N/A;    There were no vitals filed for this visit.  Subjective Assessment - 11/20/18 0854    Subjective  Patient aware that today is last session. Wife present to review taping for performance prior to tournaments. No pain since last session. Compliance with HEP.  Pertinent History  Patient is a pleasant 73 year old male for right shoulder that began mid September..  Patient has had intermittent shoulder pain for months after he was lifting weights and felt a pop in his shoulder after an overhead movement. MRI imaging per physician note indicated a significant tear of supraspinatus and infraspinatus tendons as well as atrophy of infraspinatus muscle. Moderate tendinosis of intra articular portion of biceps tendon noted.  Patient regularly sees cardiology for aortic stenosis and thoracic aortic aneurysms s/p pericardial tissue AVR and aneurysm graft (2015). 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 He enjoyed softball and walking 40 minutes 3days/week.  Patient has been recommended to get a cortisone surgery  and have physical therapy. Wants to return to softball in march.     Limitations  Lifting;House hold activities;Other (comment)    How long can you sit comfortably?  n/a    How long can you stand comfortably?  n/a    How long can you walk comfortably?  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  No/denies              Manual:  ER/ IR PROM holds of 60 seconds, 10x. Bringing arm to 90 degrees abduction with elbow propped on PT knee for stabilization. Stability provided to [facility] for ER and inferior pressure applied for reduction of impingement with IR   Pectoral STM/trigger point to decrease muscle tissue tension and improve length for postural correction 2 minutes    TherEx: Half foam roller: Robber stretch with assist for alignment of UE's for maximal pectoral stretch.  Varying planes of stretch ~ 3 minutes  In other gym: review patient's current exercise routine  Cable machine:   Overhead throwing mechanism 1 plate. Focus on slow velocity for maximal stretch and proper body mechanics. Verbal cueing for scapular movement and stance.    Triceps overhead press modified for carryover to throwing biomechanics. Cueing/correction for scapular and [place] position with widening BOS.    Bent over rows with BUE, verbal and tactile cueing for neutralizing spinal alignment, bending LE's for maximal muscle recruitment and stability rather than compensatory muscle recruitment.     RTB ER with towel under elbow for bony alignment. Verbal cueing for keeping neutral body alignment.      Seated:  GTB row RUE only, tactile cueing to shoulder for depression and retraction 15x  GTB straight arm lat pulldown for postural correction 10x    IR towel stretch 60 seconds  ER towel stretch 60 seconds   K tape placement: R shoulder three strips, two for retraction, one  for ER. Wife present and videod taping for replication at later time for his tournaments. No questions after education was performed.                       PT Education - 11/20/18 0855    Education Details  discharge, POC, HEP, body mechanics    Person(s) Educated  Patient    Methods  Explanation;Demonstration;Tactile cues;Verbal cues    Comprehension  Verbalized understanding;Returned demonstration;Verbal cues required;Tactile cues required;Need further instruction       PT Short Term Goals - 11/13/18 0804      PT SHORT TERM GOAL #1   Title  Pt will be independent with  HEP in order to improve strength and decrease pain in order to improve pain-free function at home and work.    Baseline  12/20: HEP given 1/17: HEP compliant; 11/13/18: consistent with home program    Time  2    Period  Weeks    Status  On-going      PT SHORT TERM GOAL #2   Title  Pt will decrease worst pain as reported on NPRS  to 3/10  in order to demonstrate clinically significant reduction in pain.    Baseline  12/20: 5/10 1/17: 3/10     Time  2    Period  Weeks    Status  Achieved        PT Long Term Goals - 11/13/18 0805      PT LONG TERM GOAL #1   Title  Pt will decrease worst pain as reported on NPRS to 2/10 in order to demonstrate clinically significant reduction in pain.    Baseline  12/20: 5/10 1/17: 3/10; 11/13/18: 3/10    Time  4    Period  Weeks    Status  Partially Met    Target Date  12/11/18      PT LONG TERM GOAL #2   Title  Pt will increase strength of R shoulder to 4+/5 in order to demonstrate improvement in strength and function     Baseline  12/20: R gross 4-/5 with ER 2+/5 1/17: 4/5 with ER 2+/5    Time  4    Period  Weeks    Status  Deferred    Target Date  12/11/18      PT LONG TERM GOAL #3   Title  Patient will return to softball and gym program with no limitations or compensations for return to PLOF.     Baseline  12/20: unable to throw ball, has to alter  gym program 1/17: returning to gym program with limitations, today is first attempt at throwing ball; 11/13/18: Pt has been out a few times to practice throwing without any increase in pain, minor compensations noted during throwing    Time  4    Period  Weeks    Status  Partially Met    Target Date  12/11/18      PT LONG TERM GOAL #4   Title  Pt will decrease quick DASH Sport score by at least 8%  to 29.5% in order to demonstrate clinically significant reduction in disability.    Baseline  12/20: 37.5% 1/17: 30%; 11/13/18: 0%    Time  4    Period  Weeks    Status  Achieved      PT LONG TERM GOAL #5   Title  Patient will increase R shoulder ER in standing actively to neutral position without compensatory patterning for return to proper throwing position.     Baseline  12/20: -10 degrees 1/17: -9 degrees; 11/13/18: -9 degrees    Time  4    Period  Weeks    Status  On-going    Target Date  12/11/18            Plan - 11/20/18 0857    Clinical Impression Statement  Patient's current gym program reviewed and corrected for maximal muscle recruitment and preventative care.Patient is aware that today is last session and pre game warm up was reviewed verbally. Patient's goals were performed last session as can be seen in the note on 2/17. Patient has returned to ~>90% of his  full function per his report indicating successful completion of therapy at this time. Patient is ready to be discharged due to independence in HEP and return to PLOF. I will be happy to see patient again in the future as needed.     Rehab Potential  Excellent    Clinical Impairments Affecting Rehab Potential  (+) previous level of function, high pain tolerance (-) chronicity of R shoulder activation, concurent cardiac complications    PT Frequency  2x / week    PT Duration  4 weeks    PT Treatment/Interventions  ADLs/Self Care Home Management;Aquatic Therapy;Biofeedback;Cryotherapy;Electrical Stimulation;Iontophoresis  24m/ml Dexamethasone;Moist Heat;Traction;Ultrasound;Therapeutic exercise;Therapeutic activities;Functional mobility training;Neuromuscular re-education;Patient/family education;Manual techniques;Compression bandaging;Scar mobilization;Passive range of motion;Vasopneumatic Device;Taping;Splinting;Energy conservation;Dry needling    PT Home Exercise Plan  see above    Consulted and Agree with Plan of Care  Patient       Patient will benefit from skilled therapeutic intervention in order to improve the following deficits and impairments:  Cardiopulmonary status limiting activity, Decreased activity tolerance, Decreased endurance, Decreased range of motion, Decreased strength, Hypermobility, Hypomobility, Increased fascial restricitons, Increased edema, Impaired flexibility, Impaired UE functional use, Postural dysfunction, Improper body mechanics, Pain  Visit Diagnosis: Acute pain of right shoulder  Stiffness of right shoulder, not elsewhere classified  Muscle weakness  Abnormal posture     Problem List Patient Active Problem List   Diagnosis Date Noted  . History of colonic polyps 02/18/2017  . Thrombosed external hemorrhoid 02/18/2017  . CAD (coronary artery disease) 10/17/2016  . Thoracic aortic atherosclerosis ([place] 10/17/2016  . Health maintenance examination 08/12/2015  . Advanced care planning/counseling discussion 08/05/2014  . Medicare annual wellness visit, subsequent 08/05/2014  . Prediabetes 08/05/2014  . [place] atrial fibrillation ([place] 07/23/2014  . S/P AVR (aortic valve replacement) and aortoplasty 07/23/2014  . Thoracic ascending aortic aneurysm ([place] 07/02/2014  . Sarcoidosis 04/26/2014  . Asthmatic bronchitis 04/24/2014  . Pulmonary nodule, left   . Gross hematuria 11/06/2013  . Essential hypertension 08/29/2013  . Aortic insufficiency   . Aortic valve stenosis, severe   . DJD (degenerative joint disease) 02/27/2013  . Knee pain 02/27/2013  . Wedge compression  fracture of thoracic vertebra ([place] 02/27/2013  . TESTICULAR HYPOFUNCTION 09/08/2010  . Recurrent nephrolithiasis 08/23/2009  . 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 Glaucoma 08/22/2008  . HYPERCHOLESTEROLEMIA 12/18/2007  . Obstructive sleep apnea 12/18/2007  . ALLERGIC RHINITIS 12/18/2007   MJanna Arch PT, DPT   11/20/2018, 8:59 AM  [place] [facility]aseSERVICES [address] [address]Phone: [phone]  Fax:  [phone] Name: Jeremy RAFFELMRN: [id number] of Birth: [birth year]

## 2018-12-04 DIAGNOSIS — M25511 Pain in right shoulder: Secondary | ICD-10-CM | POA: Diagnosis not present

## 2018-12-19 ENCOUNTER — Encounter: Payer: Self-pay | Admitting: Family Medicine

## 2018-12-19 DIAGNOSIS — S46011S Strain of muscle(s) and tendon(s) of the rotator cuff of right shoulder, sequela: Secondary | ICD-10-CM | POA: Insufficient documentation

## 2019-03-05 ENCOUNTER — Other Ambulatory Visit: Payer: Self-pay | Admitting: Cardiology

## 2019-03-05 DIAGNOSIS — I10 Essential (primary) hypertension: Secondary | ICD-10-CM

## 2019-03-05 NOTE — Telephone Encounter (Signed)
New Message     *STAT* If patient is at the pharmacy, call can be transferred to refill team.   1. Which medications need to be refilled? (please list name of each medication and dose if known) amlodipine, atorvastatin, metoprolol tarte   2. Which pharmacy/location (including street and city if local pharmacy) is medication to be sent to? [facility]   3. Do they need a 30 day or 90 day supply? [place]

## 2019-03-06 MED ORDER — ATORVASTATIN CALCIUM 10 MG PO TABS: 10.0000 mg | ORAL_TABLET | Freq: Every day | ORAL | 0 refills | Status: DC

## 2019-03-06 MED ORDER — AMLODIPINE BESYLATE 2.5 MG PO TABS: 2.5000 mg | ORAL_TABLET | Freq: Every day | ORAL | 0 refills | Status: DC

## 2019-03-06 MED ORDER — METOPROLOL TARTRATE 50 MG PO TABS: 50.0000 mg | ORAL_TABLET | Freq: Two times a day (BID) | ORAL | 0 refills | Status: DC

## 2019-03-06 NOTE — Telephone Encounter (Signed)
Rx(s) sent to pharmacy electronically.  

## 2019-03-15 DIAGNOSIS — H5210 Myopia, unspecified eye: Secondary | ICD-10-CM | POA: Diagnosis not present

## 2019-04-02 DIAGNOSIS — M25511 Pain in right shoulder: Secondary | ICD-10-CM | POA: Diagnosis not present

## 2019-04-23 ENCOUNTER — Telehealth: Payer: Self-pay | Admitting: Cardiology

## 2019-04-23 NOTE — Telephone Encounter (Signed)
LVM for patient to call and schedule yearly followup.

## 2019-05-29 ENCOUNTER — Ambulatory Visit (INDEPENDENT_AMBULATORY_CARE_PROVIDER_SITE_OTHER): Payer: Medicare HMO

## 2019-05-29 ENCOUNTER — Ambulatory Visit: Payer: Medicare HMO

## 2019-05-29 DIAGNOSIS — Z23 Encounter for immunization: Secondary | ICD-10-CM | POA: Diagnosis not present

## 2019-05-31 ENCOUNTER — Ambulatory Visit (HOSPITAL_COMMUNITY): Payer: Medicare HMO | Attending: Internal Medicine

## 2019-05-31 ENCOUNTER — Other Ambulatory Visit: Payer: Self-pay

## 2019-05-31 DIAGNOSIS — I10 Essential (primary) hypertension: Secondary | ICD-10-CM

## 2019-05-31 DIAGNOSIS — Z952 Presence of prosthetic heart valve: Secondary | ICD-10-CM

## 2019-05-31 DIAGNOSIS — I7121 Aneurysm of the ascending aorta, without rupture: Secondary | ICD-10-CM

## 2019-05-31 DIAGNOSIS — I712 Thoracic aortic aneurysm, without rupture: Secondary | ICD-10-CM

## 2019-06-26 NOTE — Progress Notes (Signed)
I will  Jeremy Meadows Date of Birth: 1946-06-18 Medical Record [id number]  History of Present Illness: Mr. Fithen is seen for followup of aortic stenosis and  thoracic aortic aneurysm. He is s/p AVR and aortic aneurysm grafting by Dr. [name] on 07/02/14 with a #23 Magna Ease pericardial valve and 32 mm Hema shield graft. His post op course was complicated by atrial fibrillation that converted to NSR on amiodarone. Amiodarone was later discontinued and he has had no further arrhythmia.   In Nov 2016 he had an incisional hernia repaired by Dr [name], he tolerated this well. CT of the aorta in December 2017 was stable. Repeat Echo 05/31/19 showed normal LV function and normal AV prosthetic function.  On follow up today he is feeling very well. He denies any chest pain, SOB, palpitations. He plays softball twice a week and exercises 3x/week. He is maintaining social distance and wearing a mask.    Current Outpatient Medications on File Prior to Visit  Medication Sig Dispense Refill  . albuterol (VENTOLIN HFA) 108 (90 Base) MCG/ACT inhaler Inhale 2 puffs into the lungs every 6 (six) hours as needed for wheezing or shortness of breath. 3 Inhaler 1  . aspirin 81 MG tablet Take 81 mg by mouth daily.    . cholecalciferol (VITAMIN D) 1000 UNITS tablet Take 1,000 Units by mouth daily.    . dorzolamide (TRUSOPT) 2 % ophthalmic solution Apply 1 drop to eye 2 (two) times daily.    . vitamin C (ASCORBIC ACID) 500 MG tablet Take 500 mg by mouth daily.     . Melatonin 5 MG TABS at bedtime.     Current Facility-Administered Medications on File Prior to Visit  Medication Dose Route Frequency Provider Last Rate Last Dose  . 0.9 %  sodium chloride infusion  500 mL Intravenous Continuous [name], MD        Allergies  Allergen Reactions  . Ace Inhibitors Cough    Hacking cough    Past Medical History:  Diagnosis Date  . Allergic rhinitis, cause unspecified   . Aortic insufficiency    s/p  AVR 2015  . Aortic root enlargement (HCC)    thoracic aorta  . Aortic valve stenosis 2015   s/p AVR 2015 [name]) - completed cardiac rehab 08/2014  . Arthritis   . Asthma   . Atrial fibrillation ([place]) 07/23/2014  . Benign neoplasm of colon   . Gallstones    by CT  . History of kidney stones   . History of nephrolithiasis   . HLD (hyperlipidemia)   . Hypertension   . Need for prophylactic antibiotic   . Obstructive sleep apnea (adult) (pediatric)    pt. doesn't use his machine at home.  . Other testicular hypofunction   . Pulmonary nodule, left 2015   2cm, ?sarcoid by biopsy  . Unspecified glaucoma(365.9)     Past Surgical History:  Procedure Laterality Date  . AORTIC VALVE REPLACEMENT N/A 07/02/2014   Procedure: AORTIC VALVE REPLACEMENT (AVR) with 23 Aortic Magna Ease;  Surgeon: Grace Isaac, MD  . ASCENDING AORTIC ROOT REPLACEMENT N/A 07/02/2014   Procedure: supra coronary  ASCENDING AORTIC REPLACEMENT to the Inominate Artery with 60mm Hemashield Platinum with  circulatory arrest;  Surgeon: Grace Isaac, MD  . [place]    . CARDIAC VALVE REPLACEMENT    . CATARACT EXTRACTION Bilateral   . COLONOSCOPY  2013   WNL, rpt 5 yrs [name])  . COLONOSCOPY  2018  1 TA, diverticulosis, rpt 5 yrs [name])  . CORONARY ARTERY BYPASS GRAFT    . EYE SURGERY    . INTRAOPERATIVE TRANSESOPHAGEAL ECHOCARDIOGRAM N/A 07/02/2014  . JOINT REPLACEMENT Right 14   partial replacement  . KNEE SURGERY Right 05/2012   arthroscopic  . KNEE SURGERY Right 12/2013   patella  . LEFT AND RIGHT HEART CATHETERIZATION WITH CORONARY ANGIOGRAM N/A 05/22/2014   no significant obstructive CAD ([name])  . LITHOTRIPSY    . LUNG BIOPSY Left 2015   by IR - benign  . MEDIAL PARTIAL KNEE REPLACEMENT Right 03/2013  . RHINOPLASTY  1980s  . VENTRAL HERNIA REPAIR  08/26/2015  . VENTRAL HERNIA REPAIR N/A 08/26/2015   Procedure: VENTRAL HERNIA REPAIR WITH MESH ADULT;  Surgeon: Grace Isaac,  MD;  Location: [facility] OR;  Service: Vascular;  Laterality: N/A;    Social History   Tobacco Use  Smoking Status Never Smoker  Smokeless Tobacco Never Used    Social History   Substance and Sexual Activity  Alcohol Use No  . Alcohol/week: 0.0 standard drinks    Family History  Problem Relation Age of Onset  . Cancer Mother        Jaw  . CAD Father 70       MI  . Heart disease Brother   . Heart disease Brother   . Heart disease Brother   . Colon cancer Neg Hx   . Colon polyps Neg Hx   . Esophageal cancer Neg Hx   . Rectal cancer Neg Hx   . Stomach cancer Neg Hx     Review of Systems: As noted in history of present illness. All other systems were reviewed and are negative.  Physical Exam: BP 130/78   Pulse 77   Temp (!) 96.9 F (36.1 C)   Ht 6' (1.829 m)   Wt 216 lb 3.2 oz (98.1 kg)   SpO2 99%   BMI 29.32 kg/m  GENERAL:  Well appearing WM in NAD HEENT:  PERRL, EOMI, sclera are clear. Oropharynx is clear. NECK:  No jugular venous distention, carotid upstroke brisk and symmetric, no bruits, no thyromegaly or adenopathy LUNGS:  Clear to auscultation bilaterally CHEST:  Unremarkable HEART:  RRR,  PMI not displaced or sustained,S1 and S2 within normal limits, no S3, no S4: no clicks, no rubs, soft 1/6 systolic murmur RUSB ABD:  Soft, nontender. BS +, no masses or bruits. No hepatomegaly, no splenomegaly EXT:  2 + pulses throughout, no edema, no cyanosis no clubbing SKIN:  Warm and dry.  No rashes NEURO:  Alert and oriented x 3. Cranial nerves II through XII intact. PSYCH:  Cognitively intact    LABORATORY DATA: Lab Results  Component Value Date   WBC 7.8 05/30/2018   HGB 16.1 05/30/2018   HCT 47.8 05/30/2018   PLT 171 05/30/2018   GLUCOSE 83 05/30/2018   CHOL 103 05/30/2018   TRIG 173 (H) 05/30/2018   HDL 29 (L) 05/30/2018   LDLDIRECT 69.0 08/12/2016   LDLCALC 39 05/30/2018   ALT 21 05/30/2018   AST 26 05/30/2018   NA 143 05/30/2018   K 4.6 05/30/2018    CL 105 05/30/2018   CREATININE 1.35 (H) 05/30/2018   BUN 24 05/30/2018   CO2 24 05/30/2018   TSH 2.120 05/30/2018   PSA 1.06 08/12/2016   INR 0.99 08/14/2015   HGBA1C 5.6 07/24/2018    Ecg today shows NSR with frequent PVCs. Rate 77. Otherwise normal. I have personally reviewed  and interpreted this study.  Echo: 08/02/14: Study Conclusions  - Left ventricle: The cavity size was normal. Wall thickness was normal. Systolic function was normal. The estimated ejection fraction was in the range of 60% to 65%. Features are consistent with a pseudonormal left ventricular filling pattern, with concomitant abnormal relaxation and increased filling pressure (grade 2 diastolic dysfunction). - Aortic valve: AV valve prosthesis opens well Peak and mean gradients through the valve are 17 and 10 mm Hg respectively. - Mitral valve: There was mild regurgitation. - Left atrium: The atrium was moderately dilated.   CT CHEST WITHOUT CONTRAST  TECHNIQUE: Multidetector CT imaging of the chest was performed following the standard protocol without IV contrast.  COMPARISON:  02/20/2015  FINDINGS: Cardiovascular: No acute findings. Previous aortic valve replacement. Stable appearance of the ascending aortic graft. Distal ascending aorta along the distal aspect of the graft measures 4.5 cm in maximum diameter, which remains stable. No evidence of mediastinal hematoma.  Aortic and coronary artery atherosclerotic calcification noted. Normal heart size. No evidence pericardial effusion.  Mediastinum/Nodes: No masses or pathologically enlarged lymph nodes identified on this unenhanced exam.  Lungs/Pleura: No pulmonary infiltrate or mass identified. Mild scarring noted posterior left lower lobe. No effusion present.  Upper Abdomen:  Small calcified gallstones incidentally noted.  Musculoskeletal:  No suspicious bone lesions.  IMPRESSION: Previous aortic valve  replacement. Stable appearance of ascending aortic graft. Stable mild dilatation of distal ascending aorta along the distal aspect of the graft, measuring 4.5 cm in maximum diameter.  No acute findings.  Incidentally noted aortic and coronary atherosclerosis and cholelithiasis.   Electronically Signed   By: Earle Gell M.D.   On: 09/15/2016 14:38  Echo 05/31/19: IMPRESSIONS    1. The left ventricle has normal systolic function, with an ejection fraction of 55-60%. The cavity size was normal. Left ventricular diastolic parameters were normal. Indeterminate filling pressures No evidence of left ventricular regional wall motion  abnormalities.  2. The right ventricle has normal systolic function. The cavity was normal. There is no increase in right ventricular wall thickness.  3. The mitral valve is abnormal. Mild thickening of the mitral valve leaflet.  4. The tricuspid valve is grossly normal.  5. A 101mm Magna bioprosthesis valve is present in the aortic position. Normal aortic valve prosthesis.  6. Prosthetic aortic graft in the ascending aorta position.  7. The aorta is normal unless otherwise noted.  8. The inferior vena cava was normal in size with <50% respiratory variability.  9. When compared to the prior study: 08/02/14 EF 60-65%. AV 66mmHg max, 55mmHg mean.  Assessment / Plan: 1. Aortic stenosis/bicuspid AV -  S/p pericardial tissue valve replacement in October 2015. Doing well. Echo post op showed good valve function. Recent repeat Echo recentlly shows normal prosthetic function. SBE prophylaxis.   2. Thoracic aortic aneurysm. S/p grafting. CT showed good repair with stable follow up in December 2017.   3. Hypertension-well controlled on current medication. Rx refilled  4.PVCs asymptomatic. Noted intermittently in past. Normal LV function. Continue metoprolol.

## 2019-06-27 ENCOUNTER — Ambulatory Visit (INDEPENDENT_AMBULATORY_CARE_PROVIDER_SITE_OTHER): Payer: Medicare HMO | Admitting: Cardiology

## 2019-06-27 ENCOUNTER — Encounter: Payer: Self-pay | Admitting: Cardiology

## 2019-06-27 ENCOUNTER — Other Ambulatory Visit: Payer: Self-pay

## 2019-06-27 VITALS — BP 130/78 | HR 77 | Temp 96.9°F | Ht 72.0 in | Wt 216.2 lb

## 2019-06-27 DIAGNOSIS — Z952 Presence of prosthetic heart valve: Secondary | ICD-10-CM

## 2019-06-27 DIAGNOSIS — I712 Thoracic aortic aneurysm, without rupture: Secondary | ICD-10-CM

## 2019-06-27 DIAGNOSIS — I10 Essential (primary) hypertension: Secondary | ICD-10-CM

## 2019-06-27 DIAGNOSIS — I7121 Aneurysm of the ascending aorta, without rupture: Secondary | ICD-10-CM

## 2019-06-27 DIAGNOSIS — I493 Ventricular premature depolarization: Secondary | ICD-10-CM | POA: Diagnosis not present

## 2019-06-27 MED ORDER — AMLODIPINE BESYLATE 2.5 MG PO TABS: 2.5000 mg | ORAL_TABLET | Freq: Every day | ORAL | 3 refills | Status: DC

## 2019-06-27 MED ORDER — METOPROLOL TARTRATE 50 MG PO TABS: 50.0000 mg | ORAL_TABLET | Freq: Two times a day (BID) | ORAL | 3 refills | Status: DC

## 2019-06-27 MED ORDER — ATORVASTATIN CALCIUM 10 MG PO TABS: 10.0000 mg | ORAL_TABLET | Freq: Every day | ORAL | 3 refills | Status: DC

## 2019-07-03 DIAGNOSIS — D485 Neoplasm of uncertain behavior of skin: Secondary | ICD-10-CM | POA: Diagnosis not present

## 2019-07-03 DIAGNOSIS — L820 Inflamed seborrheic keratosis: Secondary | ICD-10-CM | POA: Diagnosis not present

## 2019-07-03 DIAGNOSIS — L80 Vitiligo: Secondary | ICD-10-CM | POA: Diagnosis not present

## 2019-07-03 DIAGNOSIS — L570 Actinic keratosis: Secondary | ICD-10-CM | POA: Diagnosis not present

## 2019-07-03 DIAGNOSIS — L821 Other seborrheic keratosis: Secondary | ICD-10-CM | POA: Diagnosis not present

## 2019-07-05 DIAGNOSIS — H401131 Primary open-angle glaucoma, bilateral, mild stage: Secondary | ICD-10-CM | POA: Diagnosis not present

## 2019-07-16 ENCOUNTER — Encounter

## 2019-07-19 ENCOUNTER — Ambulatory Visit: Payer: Medicare HMO | Admitting: Cardiology

## 2019-08-03 ENCOUNTER — Ambulatory Visit: Payer: Medicare HMO

## 2019-08-09 ENCOUNTER — Ambulatory Visit: Payer: Medicare HMO

## 2019-08-10 ENCOUNTER — Encounter: Payer: Medicare HMO | Admitting: Family Medicine

## 2019-08-14 ENCOUNTER — Other Ambulatory Visit: Payer: Self-pay | Admitting: Family Medicine

## 2019-08-14 DIAGNOSIS — Z125 Encounter for screening for malignant neoplasm of prostate: Secondary | ICD-10-CM

## 2019-08-14 DIAGNOSIS — R7303 Prediabetes: Secondary | ICD-10-CM

## 2019-08-14 DIAGNOSIS — I10 Essential (primary) hypertension: Secondary | ICD-10-CM

## 2019-08-14 DIAGNOSIS — E7800 Pure hypercholesterolemia, unspecified: Secondary | ICD-10-CM

## 2019-08-15 ENCOUNTER — Other Ambulatory Visit (INDEPENDENT_AMBULATORY_CARE_PROVIDER_SITE_OTHER): Payer: Medicare HMO

## 2019-08-15 ENCOUNTER — Ambulatory Visit (INDEPENDENT_AMBULATORY_CARE_PROVIDER_SITE_OTHER): Payer: Medicare HMO

## 2019-08-15 DIAGNOSIS — Z0000 Encounter for general adult medical examination without abnormal findings: Secondary | ICD-10-CM

## 2019-08-15 DIAGNOSIS — R7303 Prediabetes: Secondary | ICD-10-CM

## 2019-08-15 DIAGNOSIS — Z125 Encounter for screening for malignant neoplasm of prostate: Secondary | ICD-10-CM | POA: Diagnosis not present

## 2019-08-15 DIAGNOSIS — E7800 Pure hypercholesterolemia, unspecified: Secondary | ICD-10-CM

## 2019-08-15 DIAGNOSIS — I10 Essential (primary) hypertension: Secondary | ICD-10-CM | POA: Diagnosis not present

## 2019-08-15 LAB — MICROALBUMIN / CREATININE URINE RATIO
Creatinine,U: 451.1 mg/dL
Microalb Creat Ratio: 0.5 mg/g (ref 0.0–30.0)
Microalb, Ur: 2.1 mg/dL — ABNORMAL HIGH (ref 0.0–1.9)

## 2019-08-15 LAB — COMPREHENSIVE METABOLIC PANEL
ALT: 43 U/L (ref 0–53)
AST: 34 U/L (ref 0–37)
Albumin: 4.4 g/dL (ref 3.5–5.2)
Alkaline Phosphatase: 90 U/L (ref 39–117)
BUN: 17 mg/dL (ref 6–23)
CO2: 30 mEq/L (ref 19–32)
Calcium: 9.4 mg/dL (ref 8.4–10.5)
Chloride: 105 mEq/L (ref 96–112)
Creatinine, Ser: 1.26 mg/dL (ref 0.40–1.50)
GFR: 55.97 mL/min — ABNORMAL LOW (ref >60.00)
Glucose, Bld: 102 mg/dL — ABNORMAL HIGH (ref 70–99)
Potassium: 4.4 mEq/L (ref 3.5–5.1)
Sodium: 142 mEq/L (ref 135–145)
Total Bilirubin: 0.8 mg/dL (ref 0.2–1.2)
Total Protein: 7 g/dL (ref 6.0–8.3)

## 2019-08-15 LAB — LIPID PANEL
Cholesterol: 128 mg/dL (ref 0–200)
HDL: 32.7 mg/dL — ABNORMAL LOW (ref >39.00)
NonHDL: 95.7
Total CHOL/HDL Ratio: 4
Triglycerides: 261 mg/dL — ABNORMAL HIGH (ref 0.0–149.0)
VLDL: 52.2 mg/dL — ABNORMAL HIGH (ref 0.0–40.0)

## 2019-08-15 LAB — PSA, MEDICARE: PSA: 0.94 ng/ml (ref 0.10–4.00)

## 2019-08-15 LAB — HEMOGLOBIN A1C: Hgb A1c MFr Bld: 5.6 % (ref 4.6–6.5)

## 2019-08-15 LAB — TSH: TSH: 2.09 u[IU]/mL (ref 0.35–4.50)

## 2019-08-15 LAB — LDL CHOLESTEROL, DIRECT: Direct LDL: 67 mg/dL

## 2019-08-15 NOTE — Progress Notes (Signed)
Subjective:   Jeremy Meadows is a 73 y.o. male who presents for Medicare Annual/Subsequent preventive examination.  Review of Systems: N/A   This visit is being conducted through telemedicine via telephone at the nurse health advisor's home address due to the COVID-19 pandemic. This patient has given me verbal consent via doximity to conduct this visit, patient states they are participating from their home address. Patient and myself are on the telephone call. There is no referral for this visit. Some vital signs may be absent or patient reported.    Patient identification: identified by name, DOB, and current address   Cardiac Risk Factors include: advanced age (>39men, >30 women);hypertension;Other (see comment), Risk factor comments: hypercholesterolemia     Objective:    Vitals: There were no vitals taken for this visit.  There is no height or weight on file to calculate BMI.  Advanced Directives 08/15/2019 09/15/2018 07/24/2018 03/08/2017 08/12/2016 08/26/2015 08/14/2015  Does Patient Have a Medical Advance Directive? Yes Yes Yes Yes Yes Yes Yes  Type of Paramedic of [place];Living will - [place];Living will Living will;Healthcare Power of [place];Living will [place];Living will -  Does patient want to make changes to medical advance directive? - - - - No - Patient declined No - Patient declined -  Copy of [place] in Chart? Yes - validated most recent copy scanned in chart (See row information) - No - copy requested - Yes Yes -  Would patient like information on creating a medical advance directive? - - - - - - -    Tobacco Social History   Tobacco Use  Smoking Status Never Smoker  Smokeless Tobacco Never Used     Counseling given: Not Answered   Clinical Intake:  Pre-visit preparation completed: Yes  Pain : No/denies pain     Nutritional Risks:  None Diabetes: No  How often do you need to have someone help you when you read instructions, pamphlets, or other written materials from your doctor or pharmacy?: 1 - Never What is the last grade level you completed in school?: college  Interpreter Needed?: No  Information entered by :: CJohnson, LPN  Past Medical History:  Diagnosis Date   Allergic rhinitis, cause unspecified    Aortic insufficiency    s/p AVR 2015   Aortic root enlargement ([place])    thoracic aorta   Aortic valve stenosis 2015   s/p AVR 2015 [name]) - completed cardiac rehab 08/2014   Arthritis    Asthma    Atrial fibrillation ([place]) 07/23/2014   Benign neoplasm of colon    Gallstones    by CT   History of kidney stones    History of nephrolithiasis    HLD (hyperlipidemia)    Hypertension    Need for prophylactic antibiotic    Obstructive sleep apnea (adult) (pediatric)    pt. doesn't use his machine at home.   Other testicular hypofunction    Pulmonary nodule, left 2015   2cm, ?sarcoid by biopsy   Unspecified glaucoma(365.9)    Past Surgical History:  Procedure Laterality Date   AORTIC VALVE REPLACEMENT N/A 07/02/2014   Procedure: AORTIC VALVE REPLACEMENT (AVR) with 23 Aortic Magna Ease;  Surgeon: Grace Isaac, MD   ASCENDING AORTIC ROOT REPLACEMENT N/A 07/02/2014   Procedure: supra coronary  ASCENDING AORTIC REPLACEMENT to the Inominate Artery with 59mm Hemashield Platinum with  circulatory arrest;  Surgeon: Grace Isaac,  MD   CARDIAC CATHETERIZATION     CARDIAC VALVE REPLACEMENT     CATARACT EXTRACTION Bilateral    COLONOSCOPY  2013   WNL, rpt 5 yrs [name])   COLONOSCOPY  2018   1 TA, diverticulosis, rpt 5 yrs [name])   CORONARY ARTERY BYPASS GRAFT     EYE SURGERY     INTRAOPERATIVE TRANSESOPHAGEAL ECHOCARDIOGRAM N/A 07/02/2014   JOINT REPLACEMENT Right 14   partial replacement   KNEE SURGERY Right 05/2012   arthroscopic   KNEE SURGERY Right 12/2013    patella   LEFT AND RIGHT HEART CATHETERIZATION WITH CORONARY ANGIOGRAM N/A 05/22/2014   no significant obstructive CAD ([name])   LITHOTRIPSY     LUNG BIOPSY Left 2015   by IR - benign   MEDIAL PARTIAL KNEE REPLACEMENT Right 03/2013   RHINOPLASTY  1980s   VENTRAL HERNIA REPAIR  08/26/2015   VENTRAL HERNIA REPAIR N/A 08/26/2015   Procedure: VENTRAL HERNIA REPAIR WITH MESH ADULT;  Surgeon: Grace Isaac, MD;  Location: [facility] OR;  Service: Vascular;  Laterality: N/A;   Family History  Problem Relation Age of Onset   Cancer Mother        Jaw   CAD Father 52       MI   Heart disease Brother    Heart disease Brother    Heart disease Brother    Colon cancer Neg Hx    Colon polyps Neg Hx    Esophageal cancer Neg Hx    Rectal cancer Neg Hx    Stomach cancer Neg Hx    Social History   Socioeconomic History   Marital status: Married    Spouse name: Not on file   Number of children: 2   Years of education: Not on file   Highest education level: Not on file  Occupational History   Occupation: retired  Scientist, product/process development strain: Not hard at all   Food insecurity    Worry: Never true    Inability: Never true   Transportation needs    Medical: No    Non-medical: No  Tobacco Use   Smoking status: Never Smoker   Smokeless tobacco: Never Used  Substance and Sexual Activity   Alcohol use: No    Alcohol/week: 0.0 standard drinks   Drug use: No   Sexual activity: Yes  Lifestyle   Physical activity    Days per week: 3 days    Minutes per session: 50 min   Stress: Not at all  Relationships   Social connections    Talks on phone: Not on file    Gets together: Not on file    Attends religious service: Not on file    Active member of club or organization: Not on file    Attends meetings of clubs or organizations: Not on file    Relationship status: Not on file  Other Topics Concern   Not on file  Social History Narrative    Lives with wife [name] in army - no agent orange exposure   Occupation: retired, was in Conservator, 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   Activity: softball, walking   Diet: good water, fruits/vegetables daily    Outpatient Encounter Medications as of 08/15/2019  Medication Sig   albuterol (VENTOLIN HFA) 108 (90 Base) MCG/ACT inhaler Inhale 2 puffs into the lungs every 6 (six) hours as needed for wheezing or shortness of breath.   amLODipine (NORVASC) 2.5 MG tablet Take 1 tablet (2.5 mg total)  by mouth daily.   aspirin 81 MG tablet Take 81 mg by mouth daily.   atorvastatin (LIPITOR) 10 MG tablet Take 1 tablet (10 mg total) by mouth daily at 6 PM.   cholecalciferol (VITAMIN D) 1000 UNITS tablet Take 1,000 Units by mouth daily.   dorzolamide (TRUSOPT) 2 % ophthalmic solution Apply 1 drop to eye 2 (two) times daily.   metoprolol tartrate (LOPRESSOR) 50 MG tablet Take 1 tablet (50 mg total) by mouth 2 (two) times daily.   vitamin C (ASCORBIC ACID) 500 MG tablet Take 500 mg by mouth daily.    Melatonin 5 MG TABS at bedtime.   Facility-Administered Encounter Medications as of 08/15/2019  Medication   0.9 %  sodium chloride infusion    Activities of Daily Living In your present state of health, do you have any difficulty performing the following activities: 08/15/2019  Hearing? Y  Comment ringing in ears  Vision? N  Difficulty concentrating or making decisions? N  Walking or climbing stairs? N  Dressing or bathing? N  Doing errands, shopping? N  Preparing Food and eating ? N  Using the Toilet? N  In the past six months, have you accidently leaked urine? N  Do you have problems with loss of bowel control? N  Managing your Medications? N  Managing your Finances? N  Housekeeping or managing your Housekeeping? N  Some recent data might be hidden    Patient Care Team: Ria Bush, MD as PCP - General (Family Medicine) [name], Peter M, MD as Consulting Physician (Cardiology) Grace Isaac, MD as Consulting Physician (Cardiothoracic Surgery)   Assessment:   This is a routine wellness examination for [place].  Exercise Activities and Dietary recommendations Current Exercise Habits: Structured exercise class, Type of exercise: strength training/weights, Time (Minutes): 45, Frequency (Times/Week): 3, Weekly Exercise (Minutes/Week): 135, Intensity: Mild, Exercise limited by: None identified  Goals     Increase physical activity     Starting 07/24/2018, I will continue to exercise at least 45 min 3 days per week.      Patient Stated     08/15/2019, I will maintain and continue medications as prescribed.        Fall Risk Fall Risk  08/15/2019 07/24/2018 08/12/2016 08/12/2015 08/05/2014  Falls in the past year? 0 No No No No  Number falls in past yr: 0 - - - -  Injury with Fall? 0 - - - -  Follow up Falls evaluation completed;Falls prevention discussed - - - -   Is the patient's home free of loose throw rugs in walkways, pet beds, electrical cords, etc?   yes      Grab bars in the bathroom? yes      Handrails on the stairs?   yes      Adequate lighting?   yes  Timed Get Up and Go Performed: N/A  Depression Screen PHQ 2/9 Scores 08/15/2019 07/24/2018 08/12/2016 08/12/2015  PHQ - 2 Score 0 0 0 0  PHQ- 9 Score 0 0 - -    Cognitive Function MMSE - Mini Mental State Exam 08/15/2019 07/24/2018 08/12/2016  Orientation to time 5 5 5   Orientation to Place 5 5 5   Registration 3 3 3   Attention/ Calculation 5 0 0  Recall 3 3 3   Language- name 2 objects - 0 0  Language- repeat 1 1 1   Language- follow 3 step command - 3 3  Language- read & follow direction - 0 0  Write a sentence -  0 0  Copy design - 0 0  Total score - 20 20  Mini Cog  Mini-Cog screen was completed. Maximum score is 22. A value of 0 denotes this part of the MMSE was not completed or the patient failed this part of the Mini-Cog screening.       Immunization History  Administered Date(s)  Administered   Fluad Quad(high Dose 65+) 05/29/2019   Influenza Split 09/07/2011, 07/04/2012   Influenza Whole 06/27/2010   Influenza,inj,Quad PF,6+ Mos 07/03/2013, 06/18/2014, 07/10/2015, 07/28/2016, 07/01/2017, 06/22/2018   Influenza-Unspecified 07/03/2013, 06/18/2014, 07/10/2015, 07/28/2016, 07/01/2017, 06/22/2018   Pneumococcal Conjugate-13 08/05/2014   Pneumococcal Polysaccharide-23 06/27/2010, 12/02/2011   Tdap 12/02/2011   Zoster 09/12/2014    Qualifies for Shingles Vaccine? Yes  Screening Tests Health Maintenance  Topic Date Due   DTaP/Tdap/Td (2 - Td) 12/01/2021   TETANUS/TDAP  12/01/2021   COLONOSCOPY  03/08/2022   INFLUENZA VACCINE  Completed   Hepatitis C Screening  Completed   PNA vac Low Risk Adult  Completed   Cancer Screenings: Lung: Low Dose CT Chest recommended if Age 31-80 years, 30 pack-year currently smoking OR have quit w/in 15years. Patient does not qualify. Colorectal: completed 03/08/2017  Additional Screenings:  Hepatitis C Screening: 08/12/2016      Plan:    Patient will maintain and continue medications as prescribed.   I have personally reviewed and noted the following in the patients chart:    Medical and social history  Use of alcohol, tobacco or illicit drugs   Current medications and supplements  Functional ability and status  Nutritional status  Physical activity  Advanced directives  List of other physicians  Hospitalizations, surgeries, and ER visits in previous 12 months  Vitals  Screenings to include cognitive, depression, and falls  Referrals and appointments  In addition, I have reviewed and discussed with patient certain preventive protocols, quality metrics, and best practice recommendations. A written personalized care plan for preventive services as well as general preventive health recommendations were provided to patient.     Andrez Grime, LPN  [id number]

## 2019-08-15 NOTE — Patient Instructions (Signed)
Jeremy Meadows , Thank you for taking time to come for your Medicare Wellness Visit. I appreciate your ongoing commitment to your health goals. Please review the following plan we discussed and let me know if I can assist you in the future.   Screening recommendations/referrals: Colonoscopy: Up to date, completed 03/08/2017 Recommended yearly ophthalmology/optometry visit for glaucoma screening and checkup Recommended yearly dental visit for hygiene and checkup  Vaccinations: Influenza vaccine: Up to date, completed 05/29/2019 Pneumococcal vaccine: Completed series Tdap vaccine: Up to date, completed 12/02/2011 Shingles vaccine: will get if covered by insurance    Advanced directives: copy in chart  Conditions/risks identified: hypertension, hypercholesterolemia  Next appointment: 08/22/2019 @ 9:30 am   Preventive Care 73 Years and Older, Male Preventive care refers to lifestyle choices and visits with your health care provider that can promote health and wellness. What does preventive care include?  A yearly physical exam. This is also called an annual well check.  Dental exams once or twice a year.  Routine eye exams. Ask your health care provider how often you should have your eyes checked.  Personal lifestyle choices, including:  Daily care of your teeth and gums.  Regular physical activity.  Eating a healthy diet.  Avoiding tobacco and drug use.  Limiting alcohol use.  Practicing safe sex.  Taking low doses of aspirin every day.  Taking vitamin and mineral supplements as recommended by your health care provider. What happens during an annual well check? The services and screenings done by your health care provider during your annual well check will depend on your age, overall health, lifestyle risk factors, and family history of disease. Counseling  Your health care provider may ask you questions about your:  Alcohol use.  Tobacco use.  Drug use.  Emotional  well-being.  Home and relationship well-being.  Sexual activity.  Eating habits.  History of falls.  Memory and ability to understand (cognition).  Work and work Statistician. Screening  You may have the following tests or measurements:  Height, weight, and BMI.  Blood pressure.  Lipid and cholesterol levels. These may be checked every 5 years, or more frequently if you are over 73 years old.  Skin check.  Lung cancer screening. You may have this screening every year starting at age 22 if you have a 30-pack-year history of smoking and currently smoke or have quit within the past 15 years.  Fecal occult blood test (FOBT) of the stool. You may have this test every year starting at age 76.  Flexible sigmoidoscopy or colonoscopy. You may have a sigmoidoscopy every 5 years or a colonoscopy every 10 years starting at age 24.  Prostate cancer screening. Recommendations will vary depending on your family history and other risks.  Hepatitis C blood test.  Hepatitis B blood test.  Sexually transmitted disease (STD) testing.  Diabetes screening. This is done by checking your blood sugar (glucose) after you have not eaten for a while (fasting). You may have this done every 1-3 years.  Abdominal aortic aneurysm (AAA) screening. You may need this if you are a current or former smoker.  Osteoporosis. You may be screened starting at age 50 if you are at high risk. Talk with your health care provider about your test results, treatment options, and if necessary, the need for more tests. Vaccines  Your health care provider may recommend certain vaccines, such as:  Influenza vaccine. This is recommended every year.  Tetanus, diphtheria, and acellular pertussis (Tdap, Td) vaccine. You may  need a Td booster every 10 years.  Zoster vaccine. You may need this after age 28.  Pneumococcal 13-valent conjugate (PCV13) vaccine. One dose is recommended after age 42.  Pneumococcal  polysaccharide (PPSV23) vaccine. One dose is recommended after age 37. Talk to your health care provider about which screenings and vaccines you need and how often you need them. This information is not intended to replace advice given to you by your health care provider. Make sure you discuss any questions you have with your health care provider. Document Released: 10/10/2015 Document Revised: 06/02/2016 Document Reviewed: 07/15/2015 Elsevier Interactive Patient Education  2017 [place] Prevention in the Home Falls can cause injuries. They can happen to people of all ages. There are many things you can do to make your home safe and to help prevent falls. What can I do on the outside of my home?  Regularly fix the edges of walkways and driveways and fix any cracks.  Remove anything that might make you trip as you walk through a door, such as a raised step or threshold.  Trim any bushes or trees on the path to your home.  Use bright outdoor lighting.  Clear any walking paths of anything that might make someone trip, such as rocks or tools.  Regularly check to see if handrails are loose or broken. Make sure that both sides of any steps have handrails.  Any raised decks and porches should have guardrails on the edges.  Have any leaves, snow, or ice cleared regularly.  Use sand or salt on walking paths during winter.  Clean up any spills in your garage right away. This includes oil or grease spills. What can I do in the bathroom?  Use night lights.  Install grab bars by the toilet and in the tub and shower. Do not use towel bars as grab bars.  Use non-skid mats or decals in the tub or shower.  If you need to sit down in the shower, use a plastic, non-slip stool.  Keep the floor dry. Clean up any water that spills on the floor as soon as it happens.  Remove soap buildup in the tub or shower regularly.  Attach bath mats securely with double-sided non-slip rug tape.   Do not have throw rugs and other things on the floor that can make you trip. What can I do in the bedroom?  Use night lights.  Make sure that you have a light by your bed that is easy to reach.  Do not use any sheets or blankets that are too big for your bed. They should not hang down onto the floor.  Have a firm chair that has side arms. You can use this for support while you get dressed.  Do not have throw rugs and other things on the floor that can make you trip. What can I do in the kitchen?  Clean up any spills right away.  Avoid walking on wet floors.  Keep items that you use a lot in easy-to-reach places.  If you need to reach something above you, use a strong step stool that has a grab bar.  Keep electrical cords out of the way.  Do not use floor polish or wax that makes floors slippery. If you must use wax, use non-skid floor wax.  Do not have throw rugs and other things on the floor that can make you trip. What can I do with my stairs?  Do not leave any items on  the stairs.  Make sure that there are handrails on both sides of the stairs and use them. Fix handrails that are broken or loose. Make sure that handrails are as long as the stairways.  Check any carpeting to make sure that it is firmly attached to the stairs. Fix any carpet that is loose or worn.  Avoid having throw rugs at the top or bottom of the stairs. If you do have throw rugs, attach them to the floor with carpet tape.  Make sure that you have a light switch at the top of the stairs and the bottom of the stairs. If you do not have them, ask someone to add them for you. What else can I do to help prevent falls?  Wear shoes that:  Do not have high heels.  Have rubber bottoms.  Are comfortable and fit you well.  Are closed at the toe. Do not wear sandals.  If you use a stepladder:  Make sure that it is fully opened. Do not climb a closed stepladder.  Make sure that both sides of the  stepladder are locked into place.  Ask someone to hold it for you, if possible.  Clearly mark and make sure that you can see:  Any grab bars or handrails.  First and last steps.  Where the edge of each step is.  Use tools that help you move around (mobility aids) if they are needed. These include:  Canes.  Walkers.  Scooters.  Crutches.  Turn on the lights when you go into a dark area. Replace any light bulbs as soon as they burn out.  Set up your furniture so you have a clear path. Avoid moving your furniture around.  If any of your floors are uneven, fix them.  If there are any pets around you, be aware of where they are.  Review your medicines with your doctor. Some medicines can make you feel dizzy. This can increase your chance of falling. Ask your doctor what other things that you can do to help prevent falls. This information is not intended to replace advice given to you by your health care provider. Make sure you discuss any questions you have with your health care provider. Document Released: 07/10/2009 Document Revised: 02/19/2016 Document Reviewed: 10/18/2014 Elsevier Interactive Patient Education  2017 [employer].

## 2019-08-15 NOTE — Progress Notes (Signed)
PCP notes:  Health Maintenance: Patient wants to get shingrix vaccine if covered by insurance.    Abnormal Screenings: none   Patient concerns: none   Nurse concerns: none   Next PCP appt.: 08/22/2019 @ 9:30 am

## 2019-08-16 ENCOUNTER — Encounter: Payer: Medicare HMO | Admitting: Family Medicine

## 2019-08-22 ENCOUNTER — Other Ambulatory Visit: Payer: Self-pay

## 2019-08-22 ENCOUNTER — Encounter: Payer: Self-pay | Admitting: Family Medicine

## 2019-08-22 ENCOUNTER — Ambulatory Visit (INDEPENDENT_AMBULATORY_CARE_PROVIDER_SITE_OTHER): Payer: Medicare HMO | Admitting: Family Medicine

## 2019-08-22 VITALS — BP 128/82 | HR 52 | Temp 98.0°F | Ht 71.0 in | Wt 223.1 lb

## 2019-08-22 DIAGNOSIS — J4520 Mild intermittent asthma, uncomplicated: Secondary | ICD-10-CM | POA: Diagnosis not present

## 2019-08-22 DIAGNOSIS — Z0000 Encounter for general adult medical examination without abnormal findings: Secondary | ICD-10-CM

## 2019-08-22 DIAGNOSIS — I10 Essential (primary) hypertension: Secondary | ICD-10-CM

## 2019-08-22 DIAGNOSIS — R7303 Prediabetes: Secondary | ICD-10-CM

## 2019-08-22 DIAGNOSIS — D869 Sarcoidosis, unspecified: Secondary | ICD-10-CM

## 2019-08-22 DIAGNOSIS — N200 Calculus of kidney: Secondary | ICD-10-CM | POA: Diagnosis not present

## 2019-08-22 DIAGNOSIS — I350 Nonrheumatic aortic (valve) stenosis: Secondary | ICD-10-CM | POA: Diagnosis not present

## 2019-08-22 DIAGNOSIS — H409 Unspecified glaucoma: Secondary | ICD-10-CM

## 2019-08-22 DIAGNOSIS — E7800 Pure hypercholesterolemia, unspecified: Secondary | ICD-10-CM | POA: Diagnosis not present

## 2019-08-22 DIAGNOSIS — Z952 Presence of prosthetic heart valve: Secondary | ICD-10-CM | POA: Diagnosis not present

## 2019-08-22 DIAGNOSIS — I712 Thoracic aortic aneurysm, without rupture: Secondary | ICD-10-CM

## 2019-08-22 DIAGNOSIS — I7121 Aneurysm of the ascending aorta, without rupture: Secondary | ICD-10-CM

## 2019-08-22 DIAGNOSIS — I700 Atherosclerosis of aorta: Secondary | ICD-10-CM

## 2019-08-22 MED ORDER — ALBUTEROL SULFATE HFA 108 (90 BASE) MCG/ACT IN AERS: 2.0000 | INHALATION_SPRAY | Freq: Four times a day (QID) | RESPIRATORY_TRACT | 1 refills | Status: DC | PRN

## 2019-08-22 NOTE — Assessment & Plan Note (Addendum)
Bicuspid valve, s/p AVR 2015.

## 2019-08-22 NOTE — Assessment & Plan Note (Signed)
Chronic, stable. Continue current regimen. 

## 2019-08-22 NOTE — Assessment & Plan Note (Signed)
Continue aspirin, statin.  

## 2019-08-22 NOTE — Assessment & Plan Note (Signed)
Chronic, trig elevated. Reviewed dietary recommendations for better triglyceride control.  The ASCVD Risk score Mikey Bussing DC Jr., et al., 2013) failed to calculate for the following reasons:   The valid total cholesterol range is 130 to 320 mg/dL

## 2019-08-22 NOTE — Assessment & Plan Note (Addendum)
See's eye doctor yearly

## 2019-08-22 NOTE — Assessment & Plan Note (Signed)
Levels normal.

## 2019-08-22 NOTE — Assessment & Plan Note (Signed)
No recurrence. 

## 2019-08-22 NOTE — Assessment & Plan Note (Signed)
asxs. 

## 2019-08-22 NOTE — Patient Instructions (Addendum)
Call insurance to ask about coverage for shingrix vaccine. If covered, call us to schedule 2 nurse visits to receive.  You are doing well today. Return as needed or in 1 year for next physical.  Look at below recommendations for sleep, let me know if worsening trouble.   Sleep hygiene checklist: 1. Avoid naps during the day 2. Avoid stimulants such as caffeine and nicotine. Avoid bedtime alcohol (it can speed onset of sleep but the body's metabolism can cause awakenings). 3. All forms of exercise help ensure sound sleep - limit vigorous exercise to morning or late afternoon 4. Avoid food too close to bedtime including chocolate (which contains caffeine) 5. Soak up natural light 6. Establish regular bedtime routine. 7. Associate bed with sleep - avoid TV, computer or phone, reading while in bed. 8. Ensure pleasant, relaxing sleep environment - quiet, dark, cool room.   Health Maintenance After Age 52 After age 43, you are at a higher risk for certain long-term diseases and infections as well as injuries from falls. Falls are a major cause of broken bones and head injuries in people who are older than age 71. Getting regular preventive care can help to keep you healthy and well. Preventive care includes getting regular testing and making lifestyle changes as recommended by your health care provider. Talk with your health care provider about:  Which screenings and tests you should have. A screening is a test that checks for a disease when you have no symptoms.  A diet and exercise plan that is right for you. What should I know about screenings and tests to prevent falls? Screening and testing are the best ways to find a health problem early. Early diagnosis and treatment give you the best chance of managing medical conditions that are common after age 16. Certain conditions and lifestyle choices may make you more likely to have a fall. Your health care provider may recommend:  Regular vision  checks. Poor vision and conditions such as cataracts can make you more likely to have a fall. If you wear glasses, make sure to get your prescription updated if your vision changes.  Medicine review. Work with your health care provider to regularly review all of the medicines you are taking, including over-the-counter medicines. Ask your health care provider about any side effects that may make you more likely to have a fall. Tell your health care provider if any medicines that you take make you feel dizzy or sleepy.  Osteoporosis screening. Osteoporosis is a condition that causes the bones to get weaker. This can make the bones weak and cause them to break more easily.  Blood pressure screening. Blood pressure changes and medicines to control blood pressure can make you feel dizzy.  Strength and balance checks. Your health care provider may recommend certain tests to check your strength and balance while standing, walking, or changing positions.  Foot health exam. Foot pain and numbness, as well as not wearing proper footwear, can make you more likely to have a fall.  Depression screening. You may be more likely to have a fall if you have a fear of falling, feel emotionally low, or feel unable to do activities that you used to do.  Alcohol use screening. Using too much alcohol can affect your balance and may make you more likely to have a fall. What actions can I take to lower my risk of falls? General instructions  Talk with your health care provider about your risks for falling. Tell your  health care provider if: ? You fall. Be sure to tell your health care provider about all falls, even ones that seem minor. ? You feel dizzy, sleepy, or off-balance.  Take over-the-counter and prescription medicines only as told by your health care provider. These include any supplements.  Eat a healthy diet and maintain a healthy weight. A healthy diet includes low-fat dairy products, low-fat (lean)  meats, and fiber from whole grains, beans, and lots of fruits and vegetables. Home safety  Remove any tripping hazards, such as rugs, cords, and clutter.  Install safety equipment such as grab bars in bathrooms and safety rails on stairs.  Keep rooms and walkways well-lit. Activity   Follow a regular exercise program to stay fit. This will help you maintain your balance. Ask your health care provider what types of exercise are appropriate for you.  If you need a cane or walker, use it as recommended by your health care provider.  Wear supportive shoes that have nonskid soles. Lifestyle  Do not drink alcohol if your health care provider tells you not to drink.  If you drink alcohol, limit how much you have: ? 0-1 drink a day for women. ? 0-2 drinks a day for men.  Be aware of how much alcohol is in your drink. In the [place] one drink equals one typical bottle of beer (12 oz), one-half glass of wine (5 oz), or one shot of hard liquor (1 oz).  Do not use any products that contain nicotine or tobacco, such as cigarettes and e-cigarettes. If you need help quitting, ask your health care provider. Summary  Having a healthy lifestyle and getting preventive care can help to protect your health and wellness after age 70.  Screening and testing are the best way to find a health problem early and help you avoid having a fall. Early diagnosis and treatment give you the best chance for managing medical conditions that are more common for people who are older than age 54.  Falls are a major cause of broken bones and head injuries in people who are older than age 44. Take precautions to prevent a fall at home.  Work with your health care provider to learn what changes you can make to improve your health and wellness and to prevent falls. This information is not intended to replace advice given to you by your health care provider. Make sure you discuss any questions you have with your health care  provider. Document Released: 07/27/2017 Document Revised: 01/04/2019 Document Reviewed: 07/27/2017 Elsevier Patient Education  2020 [employer].

## 2019-08-22 NOTE — Assessment & Plan Note (Addendum)
S/p graft repair 2015, followed by cardiology.

## 2019-08-22 NOTE — Assessment & Plan Note (Signed)
Preventative protocols reviewed and updated unless pt declined. Discussed healthy diet and lifestyle.  

## 2019-08-22 NOTE — Progress Notes (Signed)
This visit was conducted in person.  BP 128/82 (BP Location: Left Arm, Patient Position: Sitting, Cuff Size: Normal)   Pulse (!) 52   Temp 98 F (36.7 C) (Temporal)   Ht 5\' 11"  (1.803 m)   Wt 223 lb 1 oz (101.2 kg)   SpO2 92%   BMI 31.11 kg/m    CC: CPE Subjective:    Patient ID: Jeremy Meadows, male    DOB: 1946/09/10, 73 y.o.   MRN: [id number]  HPI: Jeremy Meadows is a 73 y.o. male presenting on 08/22/2019 for Annual Exam (Prt 2. )   Saw health advisor last week for medicare wellness visit. Note reviewed.   No exam data present    Clinical Support from 08/15/2019 in [place] [facility] Total Score  0      Fall Risk  08/15/2019 07/24/2018 08/12/2016 08/12/2015 08/05/2014  Falls in the past year? 0 No No No No  Number falls in past yr: 0 - - - -  Injury with Fall? 0 - - - -  Follow up Falls evaluation completed;Falls prevention discussed - - - -     Regularly sees cardiology (Jeremy Meadows) for aortic stenosis and thoracic aortic aneurysm s/p pericardial tissue AVR and aneurysm graft (Jeremy Meadows 2015). Stable period. Needs SBE ppx.   Ongoing trouble with poor sleep - bedtime is 11:30pm, early riser 4-5am. No daytime somnolence. Prior on melatonin with poor effect.   Continues playing softball, on traveling team.   Preventative: COLONOSCOPY 2018 - 1 TA, diverticulosis, rpt 5 yrs Jeremy Meadows)  Prostate cancer screening - h/o BPHpreviously saw Jeremy Meadows, saw Jeremy Meadows last 2018. No nocturia.  Flu shot yearly Pneumovax 2013, prevnar 07/2014  Tdap 11/2011  zostavax - 08/2014  shingrix - discussed - interested Advanced directives: scanned 07/2018. 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 Does not want prolonged life support if terminal condition, but ok with artificial nutrition/hydration.  Seat belt use discussed. Sunscreen use discussed. No changing moles on skin. Non smoker  Alcohol - none  Dentist q6 mo  Eye exam  yearly  Bowel - no constipation Bladder - no incontinence  Lives with Jeremy Jeremy Meadows in [employer] - no agent orange exposure Occupation: retired, was in Engineer, technical sales department Activity: softball, walking 40 min 3d/wk Diet: good water, fruits/vegetables daily      Relevant past medical, surgical, family and social history reviewed and updated as indicated. Interim medical history since our last visit reviewed. Allergies and medications reviewed and updated. Outpatient Medications Prior to Visit  Medication Sig Dispense Refill  . amLODipine (NORVASC) 2.5 MG tablet Take 1 tablet (2.5 mg total) by mouth daily. 90 tablet 3  . aspirin 81 MG tablet Take 81 mg by mouth daily.    [name] atorvastatin (LIPITOR) 10 MG tablet Take 1 tablet (10 mg total) by mouth daily at 6 PM. 90 tablet 3  . cholecalciferol (VITAMIN D) 1000 UNITS tablet Take 1,000 Units by mouth daily.    . dorzolamide (TRUSOPT) 2 % ophthalmic solution Apply 1 drop to eye 2 (two) times daily.    . metoprolol tartrate (LOPRESSOR) 50 MG tablet Take 1 tablet (50 mg total) by mouth 2 (two) times daily. 180 tablet 3  . vitamin C (ASCORBIC ACID) 500 MG tablet Take 500 mg by mouth daily.     [name] albuterol (VENTOLIN HFA) 108 (90 Base) MCG/ACT inhaler Inhale 2 puffs into the lungs every 6 (six) hours as needed for wheezing  or shortness of breath. 3 Inhaler 1  . Melatonin 5 MG TABS at bedtime.     Facility-Administered Medications Prior to Visit  Medication Dose Route Frequency Provider Last Rate Last Dose  . 0.9 %  sodium chloride infusion  500 mL Intravenous Continuous [name], MD         Per HPI unless specifically indicated in ROS section below Review of Systems  Constitutional: Negative for activity change, appetite change, chills, fatigue, fever and unexpected weight change.  HENT: Negative for hearing loss.   Eyes: Negative for visual disturbance.  Respiratory: Positive for cough and shortness of breath (with exercise). Negative for chest  tightness and wheezing.   Cardiovascular: Positive for leg swelling (mild, dependent). Negative for chest pain and palpitations.  Gastrointestinal: Negative for abdominal distention, abdominal pain, blood in stool, constipation, diarrhea, nausea and vomiting.  Genitourinary: Negative for difficulty urinating and hematuria.  Musculoskeletal: Negative for arthralgias, myalgias and neck pain.  Skin: Negative for rash.  Neurological: Negative for dizziness, seizures, syncope and headaches.  Hematological: Negative for adenopathy. Does not bruise/bleed easily.  Psychiatric/Behavioral: Negative for dysphoric mood. The patient is not nervous/anxious.    Objective:    BP 128/82 (BP Location: Left Arm, Patient Position: Sitting, Cuff Size: Normal)   Pulse (!) 52   Temp 98 F (36.7 C) (Temporal)   Ht 5\' 11"  (1.803 m)   Wt 223 lb 1 oz (101.2 kg)   SpO2 92%   BMI 31.11 kg/m   Wt Readings from Last 3 Encounters:  08/22/19 223 lb 1 oz (101.2 kg)  06/27/19 216 lb 3.2 oz (98.1 kg)  08/08/18 217 lb (98.4 kg)    Physical Exam Vitals signs and nursing note reviewed.  Constitutional:      General: He is not in acute distress.    Appearance: Normal appearance. He is well-developed. He is not ill-appearing.  HENT:     Head: Normocephalic and atraumatic.     Right Ear: Hearing, tympanic membrane, ear canal and external ear normal.     Left Ear: Hearing, tympanic membrane, ear canal and external ear normal.     Nose: Nose normal.     Mouth/Throat:     Mouth: Mucous membranes are moist.     Pharynx: Oropharynx is clear. Uvula midline. No posterior oropharyngeal erythema.  Eyes:     General: No scleral icterus.    Conjunctiva/sclera: Conjunctivae normal.     Pupils: Pupils are equal, round, and reactive to light.  Neck:     Musculoskeletal: Normal range of motion and neck supple.  Cardiovascular:     Rate and Rhythm: Normal rate and regular rhythm.     Pulses: Normal pulses.          Radial  pulses are 2+ on the right side and 2+ on the left side.     Heart sounds: Normal heart sounds. No murmur.  Pulmonary:     Effort: Pulmonary effort is normal. No respiratory distress.     Breath sounds: Normal breath sounds. No wheezing or rales.  Abdominal:     General: Abdomen is flat. Bowel sounds are normal. There is no distension.     Palpations: Abdomen is soft. There is no mass.     Tenderness: There is no abdominal tenderness. There is no guarding or rebound.     Hernia: No hernia is present.  Musculoskeletal: Normal range of motion.     Right lower leg: No edema.     Left  lower leg: No edema.  Lymphadenopathy:     Cervical: No cervical adenopathy.  Skin:    General: Skin is warm and dry.     Findings: No rash.     Comments: vitiligo  Neurological:     General: No focal deficit present.     Mental Status: He is alert and oriented to person, place, and time.     Comments: CN grossly intact, station and gait intact  Psychiatric:        Mood and Affect: Mood normal.        Behavior: Behavior normal.        Thought Content: Thought content normal.        Judgment: Judgment normal.       Results for orders placed or performed in visit on 08/15/19  PSA, Medicare  Result Value Ref Range   PSA 0.94 0.10 - 4.00 ng/ml  TSH  Result Value Ref Range   TSH 2.09 0.35 - 4.50 uIU/mL  Hemoglobin A1c  Result Value Ref Range   Hgb A1c MFr Bld 5.6 4.6 - 6.5 %  Comprehensive metabolic panel  Result Value Ref Range   Sodium 142 135 - 145 mEq/L   Potassium 4.4 3.5 - 5.1 mEq/L   Chloride 105 96 - 112 mEq/L   CO2 30 19 - 32 mEq/L   Glucose, Bld 102 (H) 70 - 99 mg/dL   BUN 17 6 - 23 mg/dL   Creatinine, Ser 1.26 0.40 - 1.50 mg/dL   Total Bilirubin 0.8 0.2 - 1.2 mg/dL   Alkaline Phosphatase 90 39 - 117 U/L   AST 34 0 - 37 U/L   ALT 43 0 - 53 U/L   Total Protein 7.0 6.0 - 8.3 g/dL   Albumin 4.4 3.5 - 5.2 g/dL   GFR 55.97 (L) >60.00 mL/min   Calcium 9.4 8.4 - 10.5 mg/dL  Lipid  panel  Result Value Ref Range   Cholesterol 128 0 - 200 mg/dL   Triglycerides 261.0 (H) 0.0 - 149.0 mg/dL   HDL 32.70 (L) >39.00 mg/dL   VLDL 52.2 (H) 0.0 - 40.0 mg/dL   Total CHOL/HDL Ratio 4    NonHDL 95.70   Microalbumin / creatinine urine ratio  Result Value Ref Range   Microalb, Ur 2.1 (H) 0.0 - 1.9 mg/dL   Creatinine,U 451.1 mg/dL   Microalb Creat Ratio 0.5 0.0 - 30.0 mg/g  LDL cholesterol, direct  Result Value Ref Range   Direct LDL 67.0 mg/dL   Assessment & Meadows:  This visit occurred during the SARS-CoV-2 public health emergency.  Safety protocols were in place, including screening questions prior to the visit, additional usage of staff PPE, and extensive cleaning of exam room while observing appropriate contact time as indicated for disinfecting solutions.   Problem List Items Addressed This Visit    Thoracic ascending aortic aneurysm [facility])    S/p graft repair 2015, followed by cardiology.       Thoracic aortic atherosclerosis (HCC)    Continue aspirin, statin.      Sarcoidosis    asxs      S/P AVR (aortic valve replacement) and aortoplasty   Recurrent nephrolithiasis    No recurrence.       Prediabetes    Levels normal.       HYPERCHOLESTEROLEMIA    Chronic, trig elevated. Reviewed dietary recommendations for better triglyceride control.  The ASCVD Risk score Mikey Bussing DC Jr., et al., 2013) failed to calculate for the following reasons:  The valid total cholesterol range is 130 to 320 mg/dL       [facility]ows examination - Primary    Preventative protocols reviewed and updated unless pt declined. Discussed healthy diet and lifestyle.       Glaucoma    Sees eye doctor yearly.      Essential hypertension    Chronic, stable. Continue current regimen.       Asthmatic bronchitis    Stable period. Ventolin refilled. Uses PRN exertion.       Relevant Medications   albuterol (VENTOLIN HFA) 108 (90 Base) MCG/ACT inhaler   Aortic valve stenosis,  severe    Bicuspid valve, s/p AVR 2015.           Meds ordered this encounter  Medications  . albuterol (VENTOLIN HFA) 108 (90 Base) MCG/ACT inhaler    Sig: Inhale 2 puffs into the lungs every 6 (six) hours as needed for wheezing or shortness of breath.    Dispense:  54 g    Refill:  1   No orders of the defined types were placed in this encounter.  Patient instructions: Call insurance to ask about coverage for shingrix vaccine. If covered, call us to schedule 2 nurse visits to receive.  You are doing well today. Return as needed or in 1 year for next physical.  Look at below recommendations for sleep, let me know if worsening trouble.   Follow up Meadows: Return in about 1 year (around 08/21/2020), or as needed, for annual exam, prior fasting for blood work, medicare wellness visit.  Ria Bush, MD

## 2019-08-22 NOTE — Assessment & Plan Note (Signed)
Stable period. Ventolin refilled. Uses PRN exertion.

## 2019-08-29 ENCOUNTER — Encounter: Payer: Self-pay | Admitting: Family Medicine

## 2019-09-14 ENCOUNTER — Other Ambulatory Visit: Payer: Self-pay | Admitting: Cardiology

## 2019-10-29 DIAGNOSIS — M75121 Complete rotator cuff tear or rupture of right shoulder, not specified as traumatic: Secondary | ICD-10-CM | POA: Diagnosis not present

## 2019-11-08 DIAGNOSIS — H401131 Primary open-angle glaucoma, bilateral, mild stage: Secondary | ICD-10-CM | POA: Diagnosis not present

## 2019-11-09 ENCOUNTER — Ambulatory Visit: Payer: Medicare HMO | Attending: Internal Medicine

## 2019-11-09 ENCOUNTER — Other Ambulatory Visit: Payer: Self-pay

## 2019-11-09 ENCOUNTER — Ambulatory Visit: Payer: Medicare HMO

## 2019-11-09 DIAGNOSIS — Z23 Encounter for immunization: Secondary | ICD-10-CM

## 2019-11-09 NOTE — Progress Notes (Signed)
   Covid-19 Vaccination Clinic  Name:  Jeremy Meadows    MRN: [id number] DOB: Mar 23, 1946  11/09/2019  [name] was observed post Covid-19 immunization for 15 minutes without incidence. He was provided with Vaccine Information Sheet and instruction to access the V-Safe system.   [name] was instructed to call 911 with any severe reactions post vaccine: [name] Difficulty breathing  . Swelling of your face and throat  . A fast heartbeat  . A bad rash all over your body  . Dizziness and weakness    Immunizations Administered    Name Date Dose VIS Date Route   Pfizer COVID-19 Vaccine 11/09/2019 12:37 PM 0.3 mL 09/07/2019 Intramuscular   Manufacturer: [place]   Lot: [id number]   [place]: [id number]

## 2019-12-05 ENCOUNTER — Ambulatory Visit: Payer: Medicare HMO | Attending: Internal Medicine

## 2019-12-05 DIAGNOSIS — Z23 Encounter for immunization: Secondary | ICD-10-CM | POA: Insufficient documentation

## 2019-12-05 NOTE — Progress Notes (Signed)
   Covid-19 Vaccination Clinic  Name:  Jeremy Meadows    MRN: [id number] DOB: Feb 23, 1946  12/05/2019  Jeremy Meadows was observed post Covid-19 immunization for 15 minutes without incident. He was provided with Vaccine Information Sheet and instruction to access the V-Safe system.   Jeremy Meadows was instructed to call 911 with any severe reactions post vaccine: [name] Difficulty breathing  . Swelling of face and throat  . A fast heartbeat  . A bad rash all over body  . Dizziness and weakness   Immunizations Administered    Name Date Dose VIS Date Route   Pfizer COVID-19 Vaccine 12/05/2019 11:22 AM 0.3 mL 09/07/2019 Intramuscular   Manufacturer: [place]   Lot: [id number]   [place]: [id number]

## 2020-02-08 ENCOUNTER — Encounter: Payer: Self-pay | Admitting: Family Medicine

## 2020-02-09 NOTE — Telephone Encounter (Signed)
Note sent to pt that Dr Darnell Level is out of the office until 5-21.

## 2020-02-12 NOTE — Telephone Encounter (Signed)
FYI to Dr. Darnell Level.

## 2020-02-14 ENCOUNTER — Telehealth: Payer: Self-pay | Admitting: Cardiology

## 2020-02-14 NOTE — Telephone Encounter (Signed)
   Pt c/o medication issue:  1. Name of Medication:   metoprolol tartrate (LOPRESSOR) 50 MG tablet    2. How are you currently taking this medication (dosage and times per day)?   3. Are you having a reaction (difficulty breathing--STAT)?   4. What is your medication issue? Pt said, Kennyth Lose called him asking if he needs a refill for his metoprolol, he said he doesn't need at the moment since he still have 6 months to a year left of this medication  Please advise

## 2020-02-14 NOTE — Telephone Encounter (Signed)
Per pt does not need any Metoprolol at this time Pt has taken med off of the auto refill as has several doses./cy

## 2020-02-18 ENCOUNTER — Other Ambulatory Visit: Payer: Self-pay

## 2020-02-18 DIAGNOSIS — I10 Essential (primary) hypertension: Secondary | ICD-10-CM

## 2020-02-18 NOTE — Telephone Encounter (Signed)
This is Dr. Jordan's pt. °

## 2020-02-20 ENCOUNTER — Other Ambulatory Visit: Payer: Self-pay | Admitting: Cardiology

## 2020-02-20 DIAGNOSIS — I10 Essential (primary) hypertension: Secondary | ICD-10-CM

## 2020-02-20 NOTE — Telephone Encounter (Signed)
New Message     *STAT* If patient is at the pharmacy, call can be transferred to refill team.   1. Which medications need to be refilled? (please list name of each medication and dose if known) metoprolol tartrate (LOPRESSOR) 50 MG tablet  2. Which pharmacy/location (including street and city if local pharmacy) is medication to be sent to? [place], [place]  3. Do they need a 30 day or 90 day supply? [place]

## 2020-02-21 MED ORDER — METOPROLOL TARTRATE 50 MG PO TABS: 50.0000 mg | ORAL_TABLET | Freq: Two times a day (BID) | ORAL | 0 refills | Status: DC

## 2020-03-06 DIAGNOSIS — H5213 Myopia, bilateral: Secondary | ICD-10-CM | POA: Diagnosis not present

## 2020-05-11 DIAGNOSIS — S40012A Contusion of left shoulder, initial encounter: Secondary | ICD-10-CM | POA: Diagnosis not present

## 2020-06-03 DIAGNOSIS — M67813 Other specified disorders of tendon, right shoulder: Secondary | ICD-10-CM | POA: Diagnosis not present

## 2020-06-03 DIAGNOSIS — M7541 Impingement syndrome of right shoulder: Secondary | ICD-10-CM | POA: Diagnosis not present

## 2020-06-03 DIAGNOSIS — M25512 Pain in left shoulder: Secondary | ICD-10-CM | POA: Diagnosis not present

## 2020-06-03 NOTE — Telephone Encounter (Signed)
I think these would be OK to take.  Doyce Stonehouse [name] MD, [facility]

## 2020-06-13 ENCOUNTER — Other Ambulatory Visit: Payer: Self-pay | Admitting: Cardiology

## 2020-06-17 DIAGNOSIS — M7541 Impingement syndrome of right shoulder: Secondary | ICD-10-CM | POA: Diagnosis not present

## 2020-06-23 DIAGNOSIS — M75122 Complete rotator cuff tear or rupture of left shoulder, not specified as traumatic: Secondary | ICD-10-CM | POA: Diagnosis not present

## 2020-06-27 ENCOUNTER — Encounter: Payer: Self-pay | Admitting: Family Medicine

## 2020-06-27 DIAGNOSIS — S46012A Strain of muscle(s) and tendon(s) of the rotator cuff of left shoulder, initial encounter: Secondary | ICD-10-CM | POA: Insufficient documentation

## 2020-07-02 ENCOUNTER — Encounter: Payer: Self-pay | Admitting: Family Medicine

## 2020-07-02 ENCOUNTER — Other Ambulatory Visit: Payer: Self-pay

## 2020-07-02 ENCOUNTER — Ambulatory Visit (INDEPENDENT_AMBULATORY_CARE_PROVIDER_SITE_OTHER): Payer: Medicare HMO

## 2020-07-02 DIAGNOSIS — Z23 Encounter for immunization: Secondary | ICD-10-CM | POA: Diagnosis not present

## 2020-07-03 ENCOUNTER — Other Ambulatory Visit: Payer: Self-pay

## 2020-07-03 ENCOUNTER — Encounter: Payer: Self-pay | Admitting: Cardiology

## 2020-07-03 ENCOUNTER — Ambulatory Visit: Payer: Medicare HMO | Admitting: Cardiology

## 2020-07-03 VITALS — BP 142/90 | HR 68 | Ht 71.75 in | Wt 214.6 lb

## 2020-07-03 DIAGNOSIS — Z952 Presence of prosthetic heart valve: Secondary | ICD-10-CM | POA: Diagnosis not present

## 2020-07-03 DIAGNOSIS — D869 Sarcoidosis, unspecified: Secondary | ICD-10-CM

## 2020-07-03 DIAGNOSIS — I712 Thoracic aortic aneurysm, without rupture: Secondary | ICD-10-CM

## 2020-07-03 DIAGNOSIS — I7121 Aneurysm of the ascending aorta, without rupture: Secondary | ICD-10-CM

## 2020-07-03 DIAGNOSIS — Z01818 Encounter for other preprocedural examination: Secondary | ICD-10-CM | POA: Insufficient documentation

## 2020-07-03 DIAGNOSIS — Z01810 Encounter for preprocedural cardiovascular examination: Secondary | ICD-10-CM

## 2020-07-03 NOTE — Assessment & Plan Note (Signed)
S/p repair 06/2014- CT Dec 2017-stable

## 2020-07-03 NOTE — Patient Instructions (Signed)
Medication Instructions:  Continue current medications  *If you need a refill on your cardiac medications before your next appointment, please call your pharmacy*   Lab Work: None Ordered   Testing/Procedures: None Ordered   Follow-Up: At [facility], you and your health needs are our priority.  As part of our continuing mission to provide you with exceptional heart care, we have created designated Provider Care Teams.  These Care Teams include your primary Cardiologist (physician) and Advanced Practice Providers (APPs -  Physician Assistants and Nurse Practitioners) who all work together to provide you with the care you need, when you need it.  We recommend signing up for the patient portal called "MyChart".  Sign up information is provided on this After Visit Summary.  MyChart is used to connect with patients for Virtual Visits (Telemedicine).  Patients are able to view lab/test results, encounter notes, upcoming appointments, etc.  Non-urgent messages can be sent to your provider as well.   To learn more about what you can do with MyChart, go to [email or web address].    Your next appointment:   1 year(s)  The format for your next appointment:   In Person  Provider:   You may see Peter, [name], MD or one of the following Advanced Practice Providers on your designated Care Team:    Almyra Deforest, PA-C  Fabian Sharp, PA-C or   Roby Lofts, [place]

## 2020-07-03 NOTE — Assessment & Plan Note (Signed)
Acceptable risk for proposed procedure without further cardiac work up

## 2020-07-03 NOTE — Progress Notes (Signed)
Cardiology Office Note:    Date:  07/03/2020   ID:  Jeremy Meadows, DOB 12-16-1945, MRN [id number]  PCP:  Ria Bush, MD  Cardiologist:  Dr [name] Electrophysiologist:  None   Referring MD: Ria Bush, MD   No chief complaint on file.   History of Present Illness:    Jeremy Meadows is a 74 y.o. male with a hx of a history of aortic stenosis and thoracic aortic aneurysm. He is s/p AVR and aortic aneurysm grafting by Dr. [name] on 07/02/14 with a #23 Magna Ease pericardial valve and 32 mm Hema shield graft. Pre op catheterization showed no significant CAD. His post op course was complicated by atrial fibrillation that converted to NSR on amiodarone. Amiodarone was later discontinued and he has had no further arrhythmia. In Nov 2016 he had an incisional hernia repaired by Dr [name], he tolerated this well.  CT done Dec 2017 was stable. Echo Sept 2020 showed normal LVF and normal prosthetic AOV function.  The patient is in the office today for a one year check up and tells me he will need Lt shoulder replacement scheduled for Nov 11th (Dr Arlina Robes, [facility] in [place]).  The patient denies any chest pain, palpitations, or unusual dyspnea.  He remains active, he is on two senior softball leagues.  He denies any other significant medical issues over the past year.  Past Medical History:  Diagnosis Date  . Allergic rhinitis, cause unspecified   . Aortic insufficiency    s/p AVR 2015  . Aortic root enlargement (HCC)    thoracic aorta  . Aortic valve stenosis 2015   s/p AVR 2015 [name]) - completed cardiac rehab 08/2014  . Arthritis   . Asthma   . Atrial fibrillation ([place]) 07/23/2014  . Benign neoplasm of colon   . Gallstones    by CT  . History of kidney stones   . History of nephrolithiasis   . HLD (hyperlipidemia)   . Hypertension   . Need for prophylactic antibiotic   . Obstructive sleep apnea (adult) (pediatric)    pt. doesn't use his machine at  home.  . Other testicular hypofunction   . Pulmonary nodule, left 2015   2cm, ?sarcoid by biopsy  . Unspecified glaucoma(365.9)     Past Surgical History:  Procedure Laterality Date  . AORTIC VALVE REPLACEMENT N/A 07/02/2014   Procedure: AORTIC VALVE REPLACEMENT (AVR) with 23 Aortic Magna Ease;  Surgeon: Grace Isaac, MD  . ASCENDING AORTIC ROOT REPLACEMENT N/A 07/02/2014   Procedure: supra coronary  ASCENDING AORTIC REPLACEMENT to the Inominate Artery with 56mm Hemashield Platinum with  circulatory arrest;  Surgeon: Grace Isaac, MD  . [place]    . CARDIAC VALVE REPLACEMENT    . CATARACT EXTRACTION Bilateral   . COLONOSCOPY  2013   WNL, rpt 5 yrs [name])  . COLONOSCOPY  2018   1 TA, diverticulosis, rpt 5 yrs [name])  . EYE SURGERY    . INTRAOPERATIVE TRANSESOPHAGEAL ECHOCARDIOGRAM N/A 07/02/2014  . JOINT REPLACEMENT Right 14   partial replacement  . KNEE SURGERY Right 05/2012   arthroscopic  . KNEE SURGERY Right 12/2013   patella  . LEFT AND RIGHT HEART CATHETERIZATION WITH CORONARY ANGIOGRAM N/A 05/22/2014   no significant obstructive CAD ([name])  . LITHOTRIPSY    . LUNG BIOPSY Left 2015   by IR - benign  . MEDIAL PARTIAL KNEE REPLACEMENT Right 03/2013  . RHINOPLASTY  1980s  . VENTRAL HERNIA REPAIR  08/26/2015  . VENTRAL HERNIA REPAIR N/A 08/26/2015   Procedure: VENTRAL HERNIA REPAIR WITH MESH ADULT;  Surgeon: Grace Isaac, MD;  Location: [facility] OR;  Service: Vascular;  Laterality: N/A;    Current Medications: Current Meds  Medication Sig  . albuterol (VENTOLIN HFA) 108 (90 Base) MCG/ACT inhaler Inhale 2 puffs into the lungs every 6 (six) hours as needed for wheezing or shortness of breath.  [name] amLODipine (NORVASC) 2.5 MG tablet TAKE 1 TABLET EVERY DAY  . aspirin 81 MG tablet Take 81 mg by mouth daily.  [name] atorvastatin (LIPITOR) 10 MG tablet TAKE 1 TABLET (10 MG TOTAL) BY MOUTH DAILY AT 6 PM.  . cholecalciferol (VITAMIN D) 1000 UNITS tablet Take  1,000 Units by mouth daily.  . dorzolamide (TRUSOPT) 2 % ophthalmic solution Apply 1 drop to eye 2 (two) times daily.  . metoprolol tartrate (LOPRESSOR) 50 MG tablet Take 1 tablet (50 mg total) by mouth 2 (two) times daily.  . vitamin C (ASCORBIC ACID) 500 MG tablet Take 500 mg by mouth daily.    Current Facility-Administered Medications for the 07/03/20 encounter (Office Visit) with Erlene Quan, PA-C  Medication  . 0.9 %  sodium chloride infusion     Allergies:   Ace inhibitors   Social History   Socioeconomic History  . Marital status: Married    Spouse name: Not on file  . Number of children: 2  . Years of education: Not on file  . Highest education level: Not on file  Occupational History  . Occupation: retired  Tobacco Use  . Smoking status: Never Smoker  . Smokeless tobacco: Never Used  Vaping Use  . Vaping Use: Never used  Substance and Sexual Activity  . Alcohol use: No    Alcohol/week: 0.0 standard drinks  . Drug use: No  . Sexual activity: Yes  Other Topics Concern  . 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   Activity: softball, walking   Diet: good water, fruits/vegetables daily   Social Determinants of Health   Financial Resource Strain: Low Risk   . Difficulty of Paying Living Expenses: Not hard at all  Food Insecurity: No Food Insecurity  . Worried About Charity fundraiser in the Last Year: Never true  . Ran Out of Food in the Last Year: Never true  Transportation Needs: No Transportation Needs  . Lack of Transportation (Medical): No  . Lack of Transportation (Non-Medical): No  Physical Activity: Sufficiently Active  . Days of Exercise per Week: 3 days  . Minutes of Exercise per Session: 50 min  Stress: No Stress Concern Present  . Feeling of Stress : Not at all  Social Connections:   . Frequency of Communication with Friends and  Family: Not on file  . Frequency of Social Gatherings with Friends and Family: Not on file  . Attends Religious Services: Not on file  . Active Member of Clubs or Organizations: Not on file  . Attends Archivist Meetings: Not on file  . Marital Status: Not on file     Family History: The patient's family history includes CAD (age of onset: 1) in his father; Cancer in his mother; Heart disease in his brother, brother, and brother. There is no history of Colon cancer, Colon polyps, Esophageal cancer, Rectal cancer, or Stomach cancer.  ROS:   Please  see the history of present illness.     All other systems reviewed and are negative.  EKGs/Labs/Other Studies Reviewed:    The following studies were reviewed today: Echo 05/31/2019- IMPRESSIONS    1. The left ventricle has normal systolic function, with an ejection  fraction of 55-60%. The cavity size was normal. Left ventricular diastolic  parameters were normal. Indeterminate filling pressures No evidence of  left ventricular regional wall motion  abnormalities.  2. The right ventricle has normal systolic function. The cavity was  normal. There is no increase in right ventricular wall thickness.  3. The mitral valve is abnormal. Mild thickening of the mitral valve  leaflet.  4. The tricuspid valve is grossly normal.  5. A 31mm Magna bioprosthesis valve is present in the aortic position.  Normal aortic valve prosthesis.  6. Prosthetic aortic graft in the ascending aorta position.  7. The aorta is normal unless otherwise noted.  8. The inferior vena cava was normal in size with <50% respiratory  variability.  9. When compared to the prior study: 08/02/14 EF 60-65%. AV 4mmHg max,  21mmHg mean.   EKG:  EKG is ordered today.  The ekg ordered today demonstrates NSR, HR 68, no acute changes  Recent Labs: 08/15/2019: ALT 43; BUN 17; Creatinine, Ser 1.26; Potassium 4.4; Sodium 142; TSH 2.09  Recent Lipid Panel     Component Value Date/Time   CHOL 128 08/15/2019 0935   CHOL 103 05/30/2018 1559   TRIG 261.0 (H) 08/15/2019 0935   HDL 32.70 (L) 08/15/2019 0935   HDL 29 (L) 05/30/2018 1559   CHOLHDL 4 08/15/2019 0935   VLDL 52.2 (H) 08/15/2019 0935   LDLCALC 39 05/30/2018 1559   LDLDIRECT 67.0 08/15/2019 0935    Physical Exam:    VS:  BP (!) 142/90   Pulse 68   Ht 5' 11.75" (1.822 m)   Wt 214 lb 9.6 oz (97.3 kg)   SpO2 97%   BMI 29.31 kg/m     Wt Readings from Last 3 Encounters:  07/03/20 214 lb 9.6 oz (97.3 kg)  08/22/19 223 lb 1 oz (101.2 kg)  06/27/19 216 lb 3.2 oz (98.1 kg)     GEN:  Well nourished, well developed in no acute distress HEENT: Normal NECK: No JVD; No carotid bruits CARDIAC: RRR, 4-4/9 systolic murmur LSB, no rubs, gallops RESPIRATORY:  Overall decreased breath sounds but clear to auscultation without rales, wheezing or rhonchi  ABDOMEN: Soft, non-tender, non-distended MUSCULOSKELETAL:  No edema; No deformity  SKIN: Warm and dry NEUROLOGIC:  Alert and oriented x 3 PSYCHIATRIC:  Normal affect   ASSESSMENT:    Pre-operative clearance Acceptable risk for proposed procedure without further cardiac work up  S/P AVR (aortic valve replacement) and aortoplasty S/P tissue AVR Oct 2015 Echo Sept 2020 showed normally functioning AOV prosthesis   Thoracic ascending aortic aneurysm S/p repair 06/2014- CT Dec 2017-stable  Sarcoidosis Asymptomatic, followed by PCP  PLAN:    Patient is cleared form a acardiac standpoint for shoulder surgery.  Formal request from operating surgeon is pending- pt will call Dr Redmond Pulling and request this be sent.  F/U Dr [name] in a year.    Medication Adjustments/Labs and Tests Ordered: Current medicines are reviewed at length with the patient today.  Concerns regarding medicines are outlined above.  Orders Placed This Encounter  Procedures  . EKG 12-Lead   No orders of the defined types were placed in this encounter.   Patient  Instructions  Medication  Instructions:  Continue current medications  *If you need a refill on your cardiac medications before your next appointment, please call your pharmacy*   Lab Work: None Ordered   Testing/Procedures: None Ordered   Follow-Up: At [facility], you and your health needs are our priority.  As part of our continuing mission to provide you with exceptional heart care, we have created designated Provider Care Teams.  These Care Teams include your primary Cardiologist (physician) and Advanced Practice Providers (APPs -  Physician Assistants and Nurse Practitioners) who all work together to provide you with the care you need, when you need it.  We recommend signing up for the patient portal called "MyChart".  Sign up information is provided on this After Visit Summary.  MyChart is used to connect with patients for Virtual Visits (Telemedicine).  Patients are able to view lab/test results, encounter notes, upcoming appointments, etc.  Non-urgent messages can be sent to your provider as well.   To learn more about what you can do with MyChart, go to [email or web address].    Your next appointment:   1 year(s)  The format for your next appointment:   In Person  Provider:   You may see Peter, [name], MD or one of the following Advanced Practice Providers on your designated Care Team:    Almyra Deforest, PA-C  Fabian Sharp, PA-C or   Roby Lofts, PA-C         Signed, Kerin Ransom, [place]  07/03/2020 8:36 AM    [place]

## 2020-07-03 NOTE — Assessment & Plan Note (Signed)
S/P tissue AVR Oct 2015 Echo Sept 2020 showed normally functioning AOV prosthesis

## 2020-07-03 NOTE — Assessment & Plan Note (Signed)
Asymptomatic, followed by PCP

## 2020-07-14 ENCOUNTER — Ambulatory Visit: Payer: Medicare HMO | Admitting: Cardiology

## 2020-08-01 ENCOUNTER — Other Ambulatory Visit: Payer: Self-pay | Admitting: Internal Medicine

## 2020-08-01 ENCOUNTER — Ambulatory Visit: Payer: Medicare HMO | Attending: Internal Medicine

## 2020-08-01 DIAGNOSIS — Z23 Encounter for immunization: Secondary | ICD-10-CM

## 2020-08-01 NOTE — Progress Notes (Signed)
   Covid-19 Vaccination Clinic  Name:  Jeremy Meadows    MRN: [id number] DOB: 1946-09-13  08/01/2020  Jeremy Meadows was observed post Covid-19 immunization for 15 minutes without incident. He was provided with Vaccine Information Sheet and instruction to access the V-Safe system.   Jeremy Meadows was instructed to call 911 with any severe reactions post vaccine: [name] Difficulty breathing  . Swelling of face and throat  . A fast heartbeat  . A bad rash all over body  . Dizziness and weakness

## 2020-08-13 DIAGNOSIS — Z01812 Encounter for preprocedural laboratory examination: Secondary | ICD-10-CM | POA: Diagnosis not present

## 2020-08-13 DIAGNOSIS — M75122 Complete rotator cuff tear or rupture of left shoulder, not specified as traumatic: Secondary | ICD-10-CM | POA: Diagnosis not present

## 2020-08-13 DIAGNOSIS — M19012 Primary osteoarthritis, left shoulder: Secondary | ICD-10-CM | POA: Diagnosis not present

## 2020-08-15 DIAGNOSIS — M24512 Contracture, left shoulder: Secondary | ICD-10-CM | POA: Diagnosis not present

## 2020-08-15 DIAGNOSIS — G8918 Other acute postprocedural pain: Secondary | ICD-10-CM | POA: Diagnosis not present

## 2020-08-15 DIAGNOSIS — M75122 Complete rotator cuff tear or rupture of left shoulder, not specified as traumatic: Secondary | ICD-10-CM | POA: Diagnosis not present

## 2020-08-15 DIAGNOSIS — E785 Hyperlipidemia, unspecified: Secondary | ICD-10-CM | POA: Diagnosis not present

## 2020-08-15 DIAGNOSIS — H4089 Other specified glaucoma: Secondary | ICD-10-CM | POA: Diagnosis not present

## 2020-08-15 DIAGNOSIS — Z79899 Other long term (current) drug therapy: Secondary | ICD-10-CM | POA: Diagnosis not present

## 2020-08-15 DIAGNOSIS — I10 Essential (primary) hypertension: Secondary | ICD-10-CM | POA: Diagnosis not present

## 2020-08-15 DIAGNOSIS — Z952 Presence of prosthetic heart valve: Secondary | ICD-10-CM | POA: Diagnosis not present

## 2020-08-15 DIAGNOSIS — M25512 Pain in left shoulder: Secondary | ICD-10-CM | POA: Diagnosis not present

## 2020-08-15 DIAGNOSIS — H409 Unspecified glaucoma: Secondary | ICD-10-CM | POA: Diagnosis not present

## 2020-08-15 DIAGNOSIS — J45990 Exercise induced bronchospasm: Secondary | ICD-10-CM | POA: Diagnosis not present

## 2020-08-15 DIAGNOSIS — M19012 Primary osteoarthritis, left shoulder: Secondary | ICD-10-CM | POA: Diagnosis not present

## 2020-08-15 DIAGNOSIS — Z791 Long term (current) use of non-steroidal anti-inflammatories (NSAID): Secondary | ICD-10-CM | POA: Diagnosis not present

## 2020-08-16 DIAGNOSIS — E785 Hyperlipidemia, unspecified: Secondary | ICD-10-CM | POA: Diagnosis not present

## 2020-08-16 DIAGNOSIS — I10 Essential (primary) hypertension: Secondary | ICD-10-CM | POA: Diagnosis not present

## 2020-08-16 DIAGNOSIS — H409 Unspecified glaucoma: Secondary | ICD-10-CM | POA: Diagnosis not present

## 2020-08-16 DIAGNOSIS — M19012 Primary osteoarthritis, left shoulder: Secondary | ICD-10-CM | POA: Diagnosis not present

## 2020-08-16 DIAGNOSIS — Z79899 Other long term (current) drug therapy: Secondary | ICD-10-CM | POA: Diagnosis not present

## 2020-08-16 DIAGNOSIS — Z791 Long term (current) use of non-steroidal anti-inflammatories (NSAID): Secondary | ICD-10-CM | POA: Diagnosis not present

## 2020-08-16 DIAGNOSIS — Z952 Presence of prosthetic heart valve: Secondary | ICD-10-CM | POA: Diagnosis not present

## 2020-08-16 DIAGNOSIS — M75122 Complete rotator cuff tear or rupture of left shoulder, not specified as traumatic: Secondary | ICD-10-CM | POA: Diagnosis not present

## 2020-08-16 DIAGNOSIS — J45990 Exercise induced bronchospasm: Secondary | ICD-10-CM | POA: Diagnosis not present

## 2020-08-24 IMAGING — MR MR SHOULDER*R* W/O CM
5 series · 32 of 40 positions shown · non-contrast
Comparison: None.

CLINICAL DATA: Injury 3 weeks ago lifting weights.  Biceps pain.

EXAM:
MRI OF THE RIGHT SHOULDER WITHOUT CONTRAST
TECHNIQUE: Multiplanar, multisequence MR imaging of the shoulder was performed.
No intravenous contrast was administered.

[Series 5: PD fat-sat · axial · right · 4.0mm · 0.55mm/px · z∈[-78,+51]mm · 8 of 28 slices shown (1 of 2)]
[im 1/28]
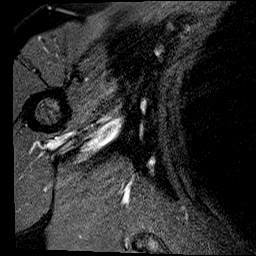
[im 4/28]
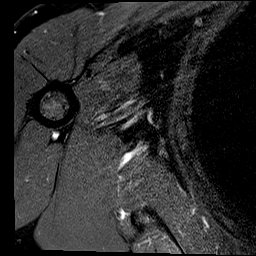
[im 10/28]
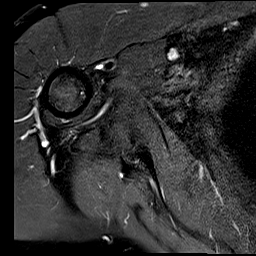
[im 13/28]
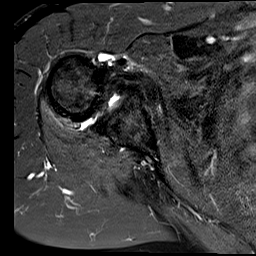
[im 16/28]
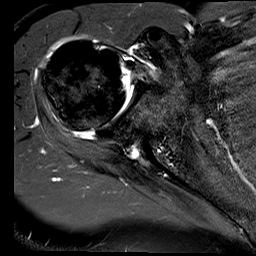
[im 19/28]
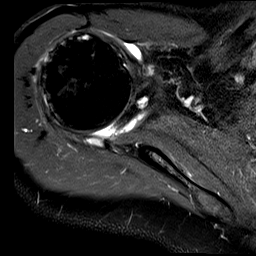
[im 25/28]
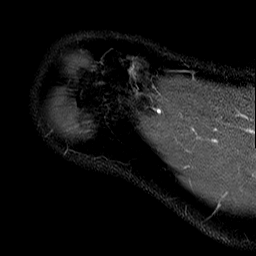
[im 28/28]
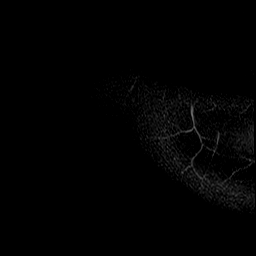

[Series 6: PD fat-sat · oblique · right · 4.0mm · 0.44mm/px · 8 of 26 slices shown (2 of 2)]
[im 1/26]
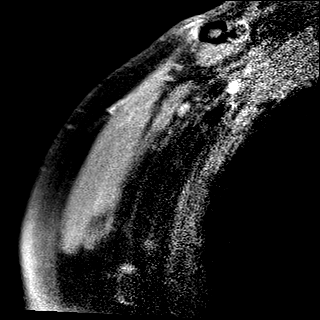
[im 4/26]
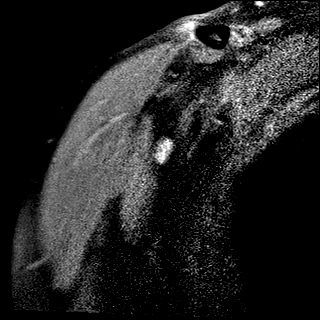
[im 8/26]
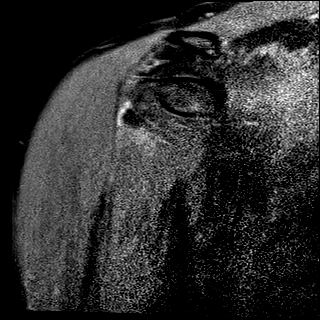
[im 11/26]
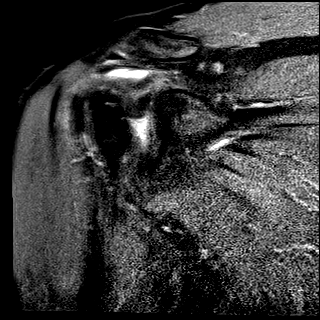
[im 15/26]
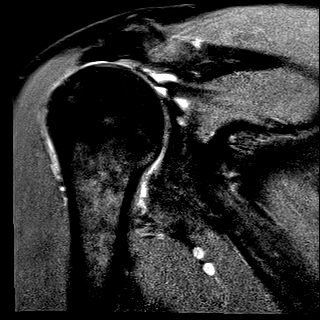
[im 18/26]
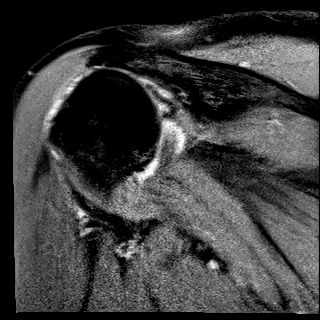
[im 22/26]
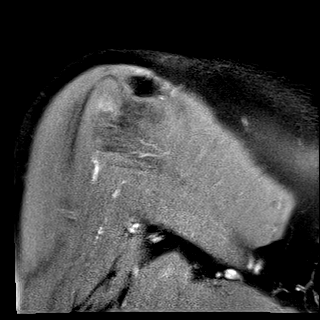
[im 26/26]
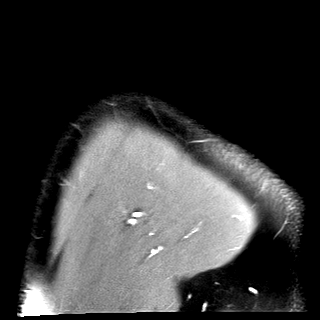

[Series 7: T2 fat-sat · oblique · right · 4.0mm · 0.44mm/px · 8 of 26 slices shown (1 of 2)]
[im 1/26]
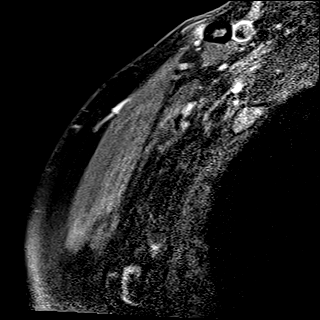
[im 4/26]
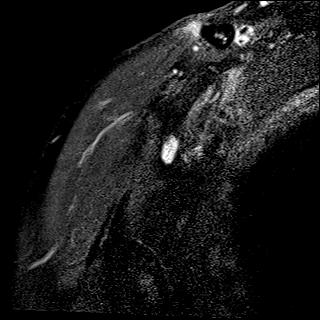
[im 8/26]
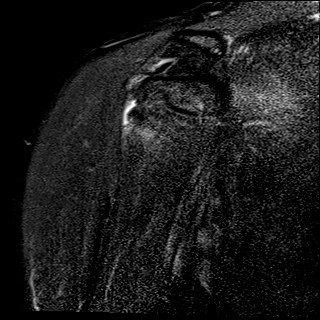
[im 11/26]
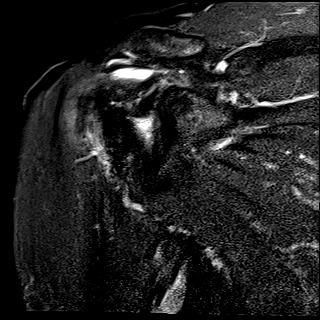
[im 15/26]
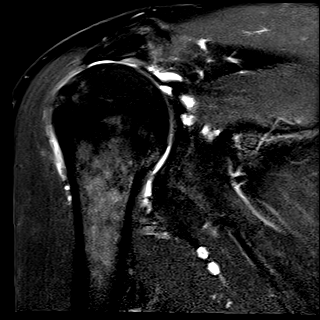
[im 18/26]
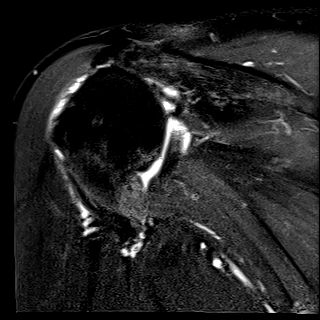
[im 22/26]
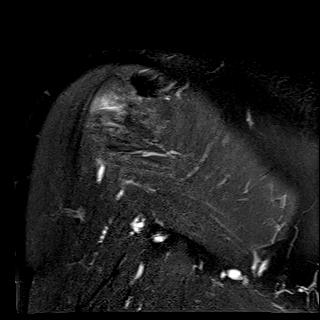
[im 26/26]
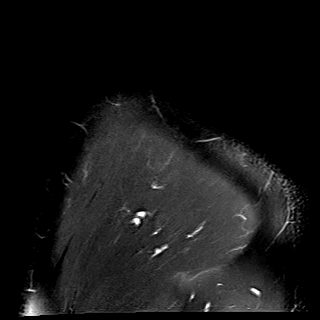

[Series 8: T2 fat-sat · oblique · right · 4.0mm · 0.23mm/px · 7 of 22 slices shown (2 of 2)]
[im 1/22]
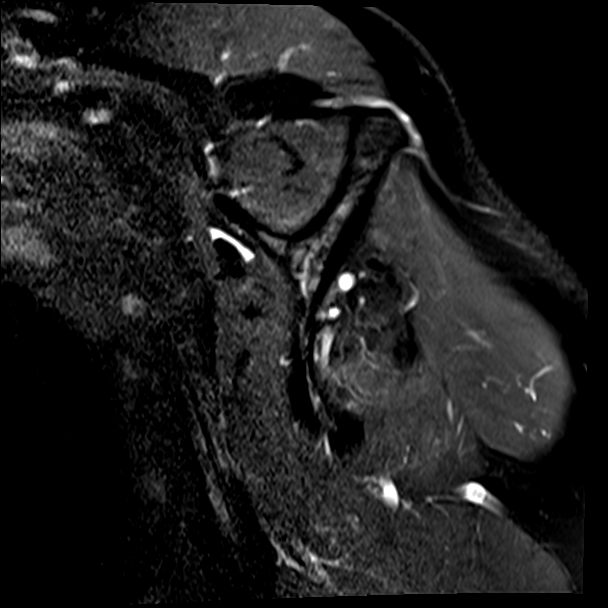
[im 4/22]
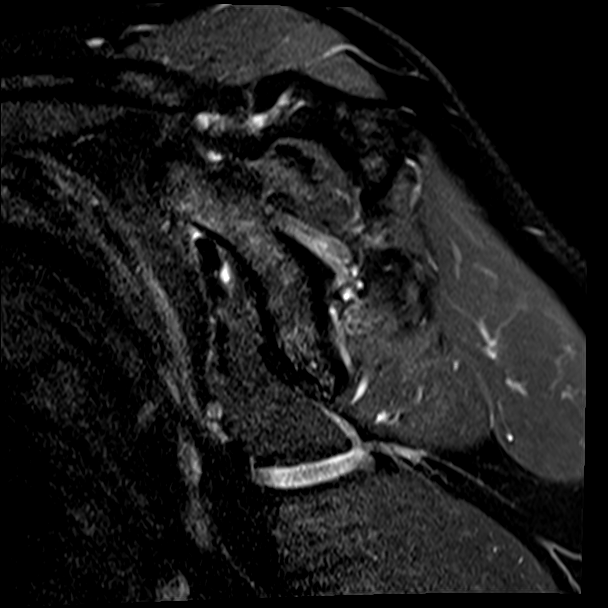
[im 8/22]
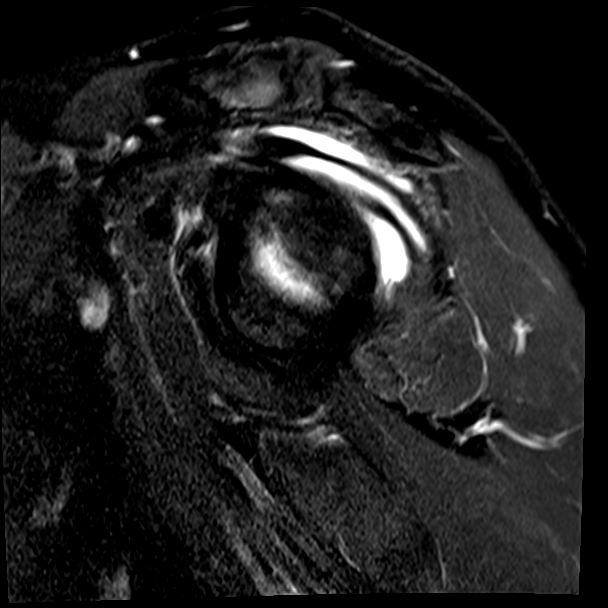
[im 11/22]
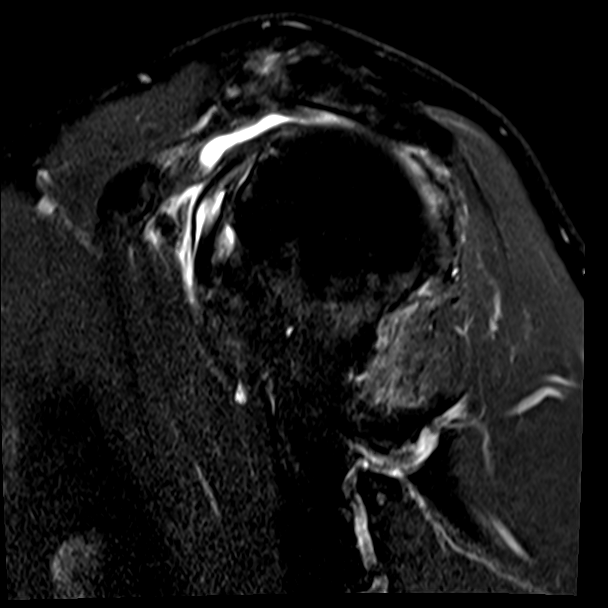
[im 15/22]
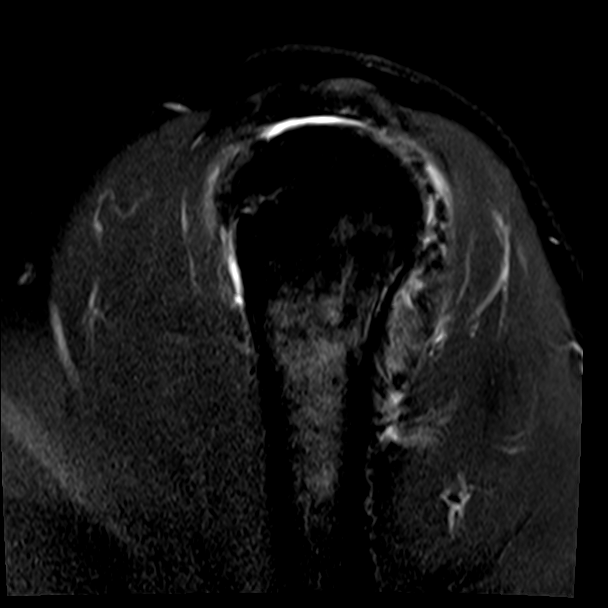
[im 18/22]
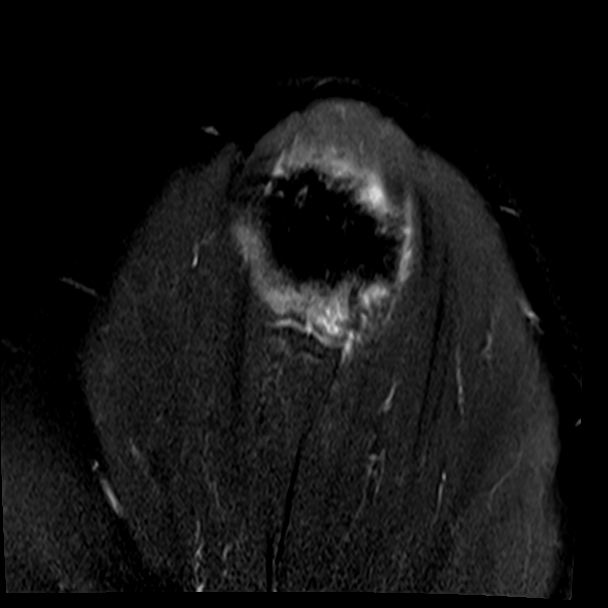
[im 22/22]
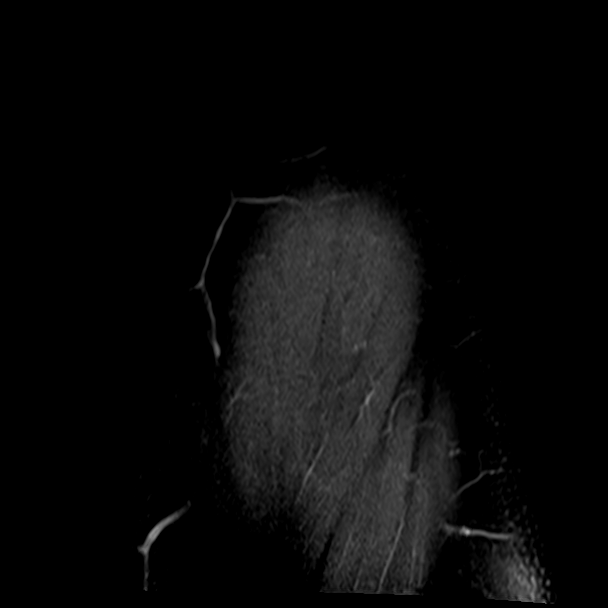

[Series 9: T1 · oblique · right · 4.0mm · 0.36mm/px · 1 of 22 slices shown]
[im 1/22]
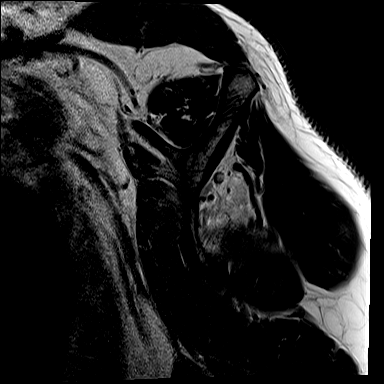

[32 of 40 positions shown; findings below may reference images not displayed]

FINDINGS: Rotator cuff: Complete tear of the supraspinatus with 3.6 cm of
retraction. Large full-thickness tear of the infraspinatus tendon
with a few intact fibers. Teres minor tendon is intact. Mild
tendinosis of the subscapularis tendon with a small partial tear.

Muscles: Severe atrophy of the infraspinatus muscle. Remainder the
rotator cuff muscles are normal.

Biceps long head: Moderate tendinosis of the intra-articular portion
of the long head of the biceps tendon.

Acromioclavicular Joint: Mild arthropathy of the acromioclavicular
joint. Type I acromion. Small amount of subacromial/subdeltoid
bursal fluid.

Glenohumeral Joint: Small joint effusion. Partial-thickness
cartilage loss of the glenohumeral joint with areas of high-grade
partial thickness cartilage loss along the superior aspect with
subchondral cystic changes.

Labrum: Grossly intact, but evaluation is limited by lack of
intraarticular fluid.

Bones:  No acute osseous abnormality.  No aggressive osseous lesion.

Other: No fluid collection or hematoma.
IMPRESSION: 1.  Complete tear of the supraspinatus with 3.6 cm of retraction.
2. Large full-thickness tear of the infraspinatus tendon with a few
intact fibers.
3. Mild tendinosis of the subscapularis tendon with a small partial
tear.
4. Severe atrophy of the infraspinatus muscle. Remainder the rotator
cuff muscles are normal.
5. Moderate tendinosis of the intra-articular portion of the long
head of the biceps tendon.
6.

## 2020-08-28 DIAGNOSIS — M25512 Pain in left shoulder: Secondary | ICD-10-CM | POA: Diagnosis not present

## 2020-08-28 DIAGNOSIS — Z96612 Presence of left artificial shoulder joint: Secondary | ICD-10-CM | POA: Diagnosis not present

## 2020-08-29 DIAGNOSIS — Z96612 Presence of left artificial shoulder joint: Secondary | ICD-10-CM | POA: Diagnosis not present

## 2020-09-02 ENCOUNTER — Ambulatory Visit: Payer: Medicare HMO | Admitting: Physical Therapy

## 2020-09-02 DIAGNOSIS — Z96612 Presence of left artificial shoulder joint: Secondary | ICD-10-CM | POA: Diagnosis not present

## 2020-09-09 ENCOUNTER — Encounter: Payer: Medicare HMO | Admitting: Physical Therapy

## 2020-09-09 DIAGNOSIS — Z96612 Presence of left artificial shoulder joint: Secondary | ICD-10-CM | POA: Diagnosis not present

## 2020-09-12 DIAGNOSIS — R600 Localized edema: Secondary | ICD-10-CM | POA: Diagnosis not present

## 2020-09-12 DIAGNOSIS — Z96612 Presence of left artificial shoulder joint: Secondary | ICD-10-CM | POA: Diagnosis not present

## 2020-09-15 DIAGNOSIS — Z96612 Presence of left artificial shoulder joint: Secondary | ICD-10-CM | POA: Diagnosis not present

## 2020-09-16 ENCOUNTER — Encounter: Payer: Medicare HMO | Admitting: Physical Therapy

## 2020-09-18 DIAGNOSIS — Z96612 Presence of left artificial shoulder joint: Secondary | ICD-10-CM | POA: Diagnosis not present

## 2020-09-22 DIAGNOSIS — Z96612 Presence of left artificial shoulder joint: Secondary | ICD-10-CM | POA: Diagnosis not present

## 2020-09-25 DIAGNOSIS — Z96612 Presence of left artificial shoulder joint: Secondary | ICD-10-CM | POA: Diagnosis not present

## 2020-09-29 ENCOUNTER — Encounter: Payer: Medicare HMO | Admitting: Physical Therapy

## 2020-09-29 DIAGNOSIS — Z96612 Presence of left artificial shoulder joint: Secondary | ICD-10-CM | POA: Diagnosis not present

## 2020-10-01 ENCOUNTER — Encounter: Payer: Medicare HMO | Admitting: Physical Therapy

## 2020-10-02 DIAGNOSIS — Z96612 Presence of left artificial shoulder joint: Secondary | ICD-10-CM | POA: Diagnosis not present

## 2020-10-06 ENCOUNTER — Encounter: Payer: Medicare HMO | Admitting: Physical Therapy

## 2020-10-06 DIAGNOSIS — Z96612 Presence of left artificial shoulder joint: Secondary | ICD-10-CM | POA: Diagnosis not present

## 2020-10-08 ENCOUNTER — Encounter: Payer: Medicare HMO | Admitting: Physical Therapy

## 2020-10-09 DIAGNOSIS — Z96612 Presence of left artificial shoulder joint: Secondary | ICD-10-CM | POA: Diagnosis not present

## 2020-10-13 ENCOUNTER — Encounter: Payer: Medicare HMO | Admitting: Physical Therapy

## 2020-10-14 DIAGNOSIS — Z96612 Presence of left artificial shoulder joint: Secondary | ICD-10-CM | POA: Diagnosis not present

## 2020-10-15 ENCOUNTER — Encounter: Payer: Medicare HMO | Admitting: Physical Therapy

## 2020-10-16 DIAGNOSIS — Z96612 Presence of left artificial shoulder joint: Secondary | ICD-10-CM | POA: Diagnosis not present

## 2020-10-16 DIAGNOSIS — Z9889 Other specified postprocedural states: Secondary | ICD-10-CM | POA: Diagnosis not present

## 2020-10-20 ENCOUNTER — Encounter: Payer: Medicare HMO | Admitting: Physical Therapy

## 2020-10-23 ENCOUNTER — Encounter: Payer: Medicare HMO | Admitting: Physical Therapy

## 2020-10-27 ENCOUNTER — Encounter: Payer: Medicare HMO | Admitting: Physical Therapy

## 2020-10-29 DIAGNOSIS — Z96612 Presence of left artificial shoulder joint: Secondary | ICD-10-CM | POA: Diagnosis not present

## 2020-11-03 DIAGNOSIS — Z96612 Presence of left artificial shoulder joint: Secondary | ICD-10-CM | POA: Diagnosis not present

## 2020-11-05 DIAGNOSIS — Z96612 Presence of left artificial shoulder joint: Secondary | ICD-10-CM | POA: Diagnosis not present

## 2020-11-10 DIAGNOSIS — Z96612 Presence of left artificial shoulder joint: Secondary | ICD-10-CM | POA: Diagnosis not present

## 2020-11-12 DIAGNOSIS — Z96612 Presence of left artificial shoulder joint: Secondary | ICD-10-CM | POA: Diagnosis not present

## 2020-11-17 DIAGNOSIS — Z96612 Presence of left artificial shoulder joint: Secondary | ICD-10-CM | POA: Diagnosis not present

## 2020-11-19 DIAGNOSIS — Z96612 Presence of left artificial shoulder joint: Secondary | ICD-10-CM | POA: Diagnosis not present

## 2020-11-26 DIAGNOSIS — Z96612 Presence of left artificial shoulder joint: Secondary | ICD-10-CM | POA: Diagnosis not present

## 2020-11-26 DIAGNOSIS — M25511 Pain in right shoulder: Secondary | ICD-10-CM | POA: Diagnosis not present

## 2020-11-28 DIAGNOSIS — Z96612 Presence of left artificial shoulder joint: Secondary | ICD-10-CM | POA: Diagnosis not present

## 2020-12-01 DIAGNOSIS — Z96612 Presence of left artificial shoulder joint: Secondary | ICD-10-CM | POA: Diagnosis not present

## 2020-12-04 DIAGNOSIS — Z96612 Presence of left artificial shoulder joint: Secondary | ICD-10-CM | POA: Diagnosis not present

## 2020-12-15 DIAGNOSIS — Z96612 Presence of left artificial shoulder joint: Secondary | ICD-10-CM | POA: Diagnosis not present

## 2021-01-05 ENCOUNTER — Telehealth: Payer: Self-pay | Admitting: Cardiology

## 2021-01-05 DIAGNOSIS — I10 Essential (primary) hypertension: Secondary | ICD-10-CM

## 2021-01-05 MED ORDER — METOPROLOL TARTRATE 50 MG PO TABS: 50.0000 mg | ORAL_TABLET | Freq: Two times a day (BID) | ORAL | 2 refills | Status: DC

## 2021-01-05 NOTE — Telephone Encounter (Signed)
*  STAT* If patient is at the pharmacy, call can be transferred to refill team.   1. Which medications need to be refilled? (please list name of each medication and dose if known) metoprolol tartrate (LOPRESSOR) 50 MG tablet  2. Which pharmacy/location (including street and city if local pharmacy) is medication to be sent to? [place], [place]  3. Do they need a 30 day or 90 day supply? 90 day

## 2021-01-06 ENCOUNTER — Other Ambulatory Visit: Payer: Self-pay

## 2021-01-06 ENCOUNTER — Other Ambulatory Visit: Payer: Self-pay | Admitting: Cardiology

## 2021-01-06 DIAGNOSIS — I10 Essential (primary) hypertension: Secondary | ICD-10-CM

## 2021-01-08 ENCOUNTER — Other Ambulatory Visit: Payer: Self-pay

## 2021-01-08 DIAGNOSIS — I10 Essential (primary) hypertension: Secondary | ICD-10-CM

## 2021-01-08 MED ORDER — METOPROLOL TARTRATE 50 MG PO TABS: 50.0000 mg | ORAL_TABLET | Freq: Two times a day (BID) | ORAL | 3 refills | Status: DC

## 2021-01-14 ENCOUNTER — Ambulatory Visit: Payer: Medicare HMO | Admitting: Family Medicine

## 2021-01-15 ENCOUNTER — Other Ambulatory Visit: Payer: Self-pay

## 2021-01-15 DIAGNOSIS — I10 Essential (primary) hypertension: Secondary | ICD-10-CM

## 2021-01-15 MED ORDER — METOPROLOL TARTRATE 50 MG PO TABS: 50.0000 mg | ORAL_TABLET | Freq: Two times a day (BID) | ORAL | 2 refills | Status: DC

## 2021-02-10 ENCOUNTER — Ambulatory Visit: Payer: Medicare HMO | Attending: Internal Medicine

## 2021-02-10 ENCOUNTER — Other Ambulatory Visit: Payer: Self-pay

## 2021-02-10 DIAGNOSIS — Z23 Encounter for immunization: Secondary | ICD-10-CM

## 2021-02-10 MED ORDER — PFIZER-BIONT COVID-19 VAC-TRIS 30 MCG/0.3ML IM SUSP
INTRAMUSCULAR | 0 refills | Status: DC
  Filled 2021-02-10: qty 0.3, 1d supply, fill #0

## 2021-02-10 NOTE — Progress Notes (Signed)
   Covid-19 Vaccination Clinic  Name:  Jeremy Meadows    MRN: [id number] DOB: [birth year]  02/10/2021  [name] was observed post Covid-19 immunization for 15 minutes without incident. He was provided with Vaccine Information Sheet and instruction to access the V-Safe system.   [name] was instructed to call 911 with any severe reactions post vaccine: [name] Difficulty breathing  . Swelling of face and throat  . A fast heartbeat  . A bad rash all over body  . Dizziness and weakness   Immunizations Administered    Name Date Dose VIS Date Route   PFIZER Comrnaty(Gray TOP) Covid-19 Vaccine 02/10/2021  9:30 AM 0.3 mL 09/04/2020 Intramuscular   Manufacturer: [employer]   Lot: [id number]   NDC: [place], PharmD, MBA Clinical Acute Care Pharmacist

## 2021-02-28 ENCOUNTER — Other Ambulatory Visit: Payer: Self-pay | Admitting: Family Medicine

## 2021-02-28 DIAGNOSIS — R7303 Prediabetes: Secondary | ICD-10-CM

## 2021-02-28 DIAGNOSIS — I10 Essential (primary) hypertension: Secondary | ICD-10-CM

## 2021-02-28 DIAGNOSIS — I4891 Unspecified atrial fibrillation: Secondary | ICD-10-CM

## 2021-02-28 DIAGNOSIS — Z125 Encounter for screening for malignant neoplasm of prostate: Secondary | ICD-10-CM

## 2021-02-28 DIAGNOSIS — E7800 Pure hypercholesterolemia, unspecified: Secondary | ICD-10-CM

## 2021-03-02 ENCOUNTER — Other Ambulatory Visit (INDEPENDENT_AMBULATORY_CARE_PROVIDER_SITE_OTHER): Payer: Medicare HMO

## 2021-03-02 ENCOUNTER — Other Ambulatory Visit: Payer: Self-pay

## 2021-03-02 DIAGNOSIS — Z125 Encounter for screening for malignant neoplasm of prostate: Secondary | ICD-10-CM

## 2021-03-02 DIAGNOSIS — I4891 Unspecified atrial fibrillation: Secondary | ICD-10-CM | POA: Diagnosis not present

## 2021-03-02 DIAGNOSIS — I10 Essential (primary) hypertension: Secondary | ICD-10-CM | POA: Diagnosis not present

## 2021-03-02 DIAGNOSIS — R7303 Prediabetes: Secondary | ICD-10-CM | POA: Diagnosis not present

## 2021-03-02 DIAGNOSIS — E7800 Pure hypercholesterolemia, unspecified: Secondary | ICD-10-CM

## 2021-03-02 LAB — LIPID PANEL
Cholesterol: 113 mg/dL (ref 0–200)
HDL: 31.3 mg/dL — ABNORMAL LOW (ref >39.00)
LDL Cholesterol: 49 mg/dL (ref 0–99)
NonHDL: 81.22
Total CHOL/HDL Ratio: 4
Triglycerides: 159 mg/dL — ABNORMAL HIGH (ref 0.0–149.0)
VLDL: 31.8 mg/dL (ref 0.0–40.0)

## 2021-03-02 LAB — MICROALBUMIN / CREATININE URINE RATIO
Creatinine,U: 147.6 mg/dL
Microalb Creat Ratio: 0.9 mg/g (ref 0.0–30.0)
Microalb, Ur: 1.3 mg/dL (ref 0.0–1.9)

## 2021-03-02 LAB — CBC WITH DIFFERENTIAL/PLATELET
Basophils Absolute: 0 10*3/uL (ref 0.0–0.1)
Basophils Relative: 0.5 % (ref 0.0–3.0)
Eosinophils Absolute: 0.2 10*3/uL (ref 0.0–0.7)
Eosinophils Relative: 2.9 % (ref 0.0–5.0)
HCT: 43.8 % (ref 39.0–52.0)
Hemoglobin: 15.2 g/dL (ref 13.0–17.0)
Lymphocytes Relative: 29 % (ref 12.0–46.0)
Lymphs Abs: 2.1 10*3/uL (ref 0.7–4.0)
MCHC: 34.6 g/dL (ref 30.0–36.0)
MCV: 94.5 fl (ref 78.0–100.0)
Monocytes Absolute: 0.9 10*3/uL (ref 0.1–1.0)
Monocytes Relative: 12.5 % — ABNORMAL HIGH (ref 3.0–12.0)
Neutro Abs: 4.1 10*3/uL (ref 1.4–7.7)
Neutrophils Relative %: 55.1 % (ref 43.0–77.0)
Platelets: 159 10*3/uL (ref 150.0–400.0)
RBC: 4.63 Mil/uL (ref 4.22–5.81)
RDW: 13.6 % (ref 11.5–15.5)
WBC: 7.4 10*3/uL (ref 4.0–10.5)

## 2021-03-02 LAB — COMPREHENSIVE METABOLIC PANEL
ALT: 19 U/L (ref 0–53)
AST: 25 U/L (ref 0–37)
Albumin: 4.3 g/dL (ref 3.5–5.2)
Alkaline Phosphatase: 78 U/L (ref 39–117)
BUN: 24 mg/dL — ABNORMAL HIGH (ref 6–23)
CO2: 27 mEq/L (ref 19–32)
Calcium: 9.5 mg/dL (ref 8.4–10.5)
Chloride: 105 mEq/L (ref 96–112)
Creatinine, Ser: 1.1 mg/dL (ref 0.40–1.50)
GFR: 65.74 mL/min (ref >60.00)
Glucose, Bld: 103 mg/dL — ABNORMAL HIGH (ref 70–99)
Potassium: 4.5 mEq/L (ref 3.5–5.1)
Sodium: 140 mEq/L (ref 135–145)
Total Bilirubin: 0.9 mg/dL (ref 0.2–1.2)
Total Protein: 6.9 g/dL (ref 6.0–8.3)

## 2021-03-02 LAB — HEMOGLOBIN A1C: Hgb A1c MFr Bld: 5.8 % (ref 4.6–6.5)

## 2021-03-02 LAB — PSA: PSA: 0.88 ng/mL (ref 0.10–4.00)

## 2021-03-09 ENCOUNTER — Other Ambulatory Visit: Payer: Self-pay

## 2021-03-09 ENCOUNTER — Ambulatory Visit (INDEPENDENT_AMBULATORY_CARE_PROVIDER_SITE_OTHER): Payer: Medicare HMO | Admitting: Family Medicine

## 2021-03-09 ENCOUNTER — Encounter: Payer: Self-pay | Admitting: Family Medicine

## 2021-03-09 VITALS — BP 122/74 | HR 78 | Temp 97.9°F | Ht 71.0 in | Wt 207.4 lb

## 2021-03-09 DIAGNOSIS — Z952 Presence of prosthetic heart valve: Secondary | ICD-10-CM

## 2021-03-09 DIAGNOSIS — I10 Essential (primary) hypertension: Secondary | ICD-10-CM

## 2021-03-09 DIAGNOSIS — Z0000 Encounter for general adult medical examination without abnormal findings: Secondary | ICD-10-CM

## 2021-03-09 DIAGNOSIS — F5101 Primary insomnia: Secondary | ICD-10-CM

## 2021-03-09 DIAGNOSIS — I4891 Unspecified atrial fibrillation: Secondary | ICD-10-CM

## 2021-03-09 DIAGNOSIS — N200 Calculus of kidney: Secondary | ICD-10-CM

## 2021-03-09 DIAGNOSIS — G4700 Insomnia, unspecified: Secondary | ICD-10-CM | POA: Insufficient documentation

## 2021-03-09 DIAGNOSIS — I700 Atherosclerosis of aorta: Secondary | ICD-10-CM

## 2021-03-09 DIAGNOSIS — R7303 Prediabetes: Secondary | ICD-10-CM

## 2021-03-09 DIAGNOSIS — D869 Sarcoidosis, unspecified: Secondary | ICD-10-CM

## 2021-03-09 DIAGNOSIS — E7800 Pure hypercholesterolemia, unspecified: Secondary | ICD-10-CM

## 2021-03-09 DIAGNOSIS — R252 Cramp and spasm: Secondary | ICD-10-CM | POA: Insufficient documentation

## 2021-03-09 MED ORDER — ALBUTEROL SULFATE HFA 108 (90 BASE) MCG/ACT IN AERS: 2.0000 | INHALATION_SPRAY | Freq: Four times a day (QID) | RESPIRATORY_TRACT | 1 refills | Status: DC | PRN

## 2021-03-09 MED ORDER — TRAZODONE HCL 50 MG PO TABS: 25.0000 mg | ORAL_TABLET | Freq: Every evening | ORAL | 3 refills | Status: DC | PRN

## 2021-03-09 NOTE — Assessment & Plan Note (Addendum)
Isolated episode of post op afib. Will continue to monitor for signs of recurrence.

## 2021-03-09 NOTE — Assessment & Plan Note (Signed)
H/o this by prior lung nodule biopsy (2015)

## 2021-03-09 NOTE — Assessment & Plan Note (Signed)
Remote. No recent flare.

## 2021-03-09 NOTE — Assessment & Plan Note (Signed)
Has tried and failed melatonin. Will trial trazodone 25-50mg  nightly PRN sleep - update with effect.

## 2021-03-09 NOTE — Patient Instructions (Addendum)
If interested, check with pharmacy about new 2 shot shingles series (shingrix).  You are doing well today  Continue current medicines Return as needed or in 1 year for next physical/wellness visit Try trazodone 25-50mg  nightly as needed for sleep

## 2021-03-09 NOTE — Progress Notes (Signed)
Patient ID: [name], male    DOB: 15-Apr-1946, 75 y.o.   MRN: [id number]  This visit was conducted in person.  BP 122/74   Pulse 78   Temp 97.9 F (36.6 C) (Temporal)   Ht 5\' 11"  (1.803 m)   Wt 207 lb 7 oz (94.1 kg)   SpO2 100%   BMI 28.93 kg/m    CC: CPE/AMW Subjective:   HPI: Jeremy Meadows is a 75 y.o. male presenting on 03/09/2021 for Medicare Wellness   Did not see health advisor this year.   Hearing Screening   500Hz  1000Hz  2000Hz  4000Hz   Right ear 25 40 40 0  Left ear 20 25 40 0   Vision Screening   Right eye Left eye Both eyes  Without correction 20/30 20/20 20/15   With correction     Doesn't notice trouble with hearing.  [place] Office Visit from 03/09/2021 in [place] at [facility]  PHQ-2 Total Score 0       Fall Risk  03/09/2021 08/15/2019 07/24/2018 08/12/2016 08/12/2015  Falls in the past year? 1 0 No No No  Number falls in past yr: 0 0 - - -  Injury with Fall? 1 0 - - -  Comment Left shoulder injury - - - -  Follow up - Falls evaluation completed;Falls prevention discussed - - -    Regularly sees cardiology ([name]) for aortic stenosis and thoracic aortic aneurysm s/p pericardial tissue AVR and aneurysm graft ([name]). Had postop afib treated with amiodarone. Needs SBE ppx. H/o sarcoid.   Ongoing trouble with poor sleep - bedtime is 11:30pm, early riser 4-5am. Melatonin 5mg  hit or miss effective. Normally no naps during the day. No significant limb movements or restless sleep. Interested in trial of sleep medication.   Notes worsening cramps when playing ball to legs as well as bilateral hands - despite drinking water/body armour lite/powerade etc.   Intentional weight loss through healthy diet choices.   Saw PT this year after L shoulder reverse replacement Continues playing softball, on traveling team.   H/o kidney stones saw [place] [facility] Jun[facility])    Preventative: COLONOSCOPY 2018 - 1 TA,  diverticulosis, rpt 5 yrs [name]) Prostate cancer screening - h/o BPH. Checks with yearly PSA. No nocturia.  Lung cancer screening - not eligible  Flu shot yearly COVID vaccine - Pfizer 10/2019, 11/2019, booster 07/2020, 01/2021 Pneumovax 2013, prevnar 07/2014  Tdap 11/2011 zostavax - 08/2014 shingrix - discussed - interested  Advanced directives: scanned 07/2018. [name] is Economist, then sons Jeremy Meadows. Does not want prolonged life support if terminal condition, but ok with artificial nutrition/hydration.  Seat belt use discussed.  Sunscreen use discussed. No changing moles on skin.  Non smoker  Alcohol - none  Dentist q6 mo Eye exam yearly  Bowel - no constipation Bladder - no incontinence   Lives with [name] in [employer] - no agent orange exposure Occupation: retired, was in Engineer, technical sales department Activity: softball, walking 40 min 3d/wk Diet: good water, fruits/vegetables daily      Relevant past medical, surgical, family and social history reviewed and updated as indicated. Interim medical history since our last visit reviewed. Allergies and medications reviewed and updated. Outpatient Medications Prior to Visit  Medication Sig Dispense Refill   amLODipine (NORVASC) 2.5 MG tablet TAKE 1 TABLET EVERY DAY 90 tablet 3   aspirin 81 MG tablet Take 81 mg by mouth daily.     atorvastatin (LIPITOR) 10  MG tablet TAKE 1 TABLET (10 MG TOTAL) BY MOUTH DAILY AT 6 PM. 90 tablet 3   cholecalciferol (VITAMIN D) 1000 UNITS tablet Take 1,000 Units by mouth daily.     dorzolamide (TRUSOPT) 2 % ophthalmic solution Apply 1 drop to eye 2 (two) times daily.     metoprolol tartrate (LOPRESSOR) 50 MG tablet Take 1 tablet (50 mg total) by mouth 2 (two) times daily. 180 tablet 2   vitamin C (ASCORBIC ACID) 500 MG tablet Take 500 mg by mouth daily.      albuterol (VENTOLIN HFA) 108 (90 Base) MCG/ACT inhaler Inhale 2 puffs into the lungs every 6 (six) hours as needed for wheezing or shortness of  breath. 54 g 1   COVID-19 mRNA Vac-TriS, Pfizer, (PFIZER-BIONT COVID-19 VAC-TRIS) SUSP injection Inject into the muscle. 0.3 mL 0   COVID-19 mRNA vaccine, Pfizer, 30 MCG/0.3ML injection USE AS DIRECTED .3 mL 0   meloxicam (MOBIC) 15 MG tablet      tiZANidine (ZANAFLEX) 4 MG tablet tizanidine 4 mg tablet  TAKE 1 TABLET BY MOUTH AT BEDTIME     Facility-Administered Medications Prior to Visit  Medication Dose Route Frequency Provider Last Rate Last Admin   0.9 %  sodium chloride infusion  500 mL Intravenous Continuous [name], MD         Per HPI unless specifically indicated in ROS section below Review of Systems  Constitutional:  Negative for activity change, appetite change, chills, fatigue, fever and unexpected weight change.  HENT:  Negative for hearing loss.   Eyes:  Negative for visual disturbance.  Respiratory:  Negative for cough, chest tightness, shortness of breath and wheezing.   Cardiovascular:  Negative for chest pain, palpitations and leg swelling.  Gastrointestinal:  Negative for abdominal distention, abdominal pain, blood in stool, constipation, diarrhea, nausea and vomiting.  Genitourinary:  Negative for difficulty urinating and hematuria.  Musculoskeletal:  Negative for arthralgias, myalgias and neck pain.  Skin:  Negative for rash.  Neurological:  Negative for dizziness, seizures, syncope and headaches.  Hematological:  Negative for adenopathy. Does not bruise/bleed easily.  Psychiatric/Behavioral:  Negative for dysphoric mood. The patient is not nervous/anxious.   Objective:  BP 122/74   Pulse 78   Temp 97.9 F (36.6 C) (Temporal)   Ht 5\' 11"  (1.803 m)   Wt 207 lb 7 oz (94.1 kg)   SpO2 100%   BMI 28.93 kg/m   Wt Readings from Last 3 Encounters:  03/09/21 207 lb 7 oz (94.1 kg)  07/03/20 214 lb 9.6 oz (97.3 kg)  08/22/19 223 lb 1 oz (101.2 kg)      Physical Exam Vitals and nursing note reviewed.  Constitutional:      General: He is not in acute  distress.    Appearance: Normal appearance. He is well-developed. He is not ill-appearing.  HENT:     Head: Normocephalic and atraumatic.     Right Ear: Hearing, tympanic membrane, ear canal and external ear normal.     Left Ear: Hearing, tympanic membrane, ear canal and external ear normal.  Eyes:     General: No scleral icterus.    Extraocular Movements: Extraocular movements intact.     Conjunctiva/sclera: Conjunctivae normal.     Pupils: Pupils are equal, round, and reactive to light.  Neck:     Thyroid: No thyroid mass or thyromegaly.     Vascular: No carotid bruit.  Cardiovascular:     Rate and Rhythm: Regular rhythm. Bradycardia present.  Pulses: Normal pulses.          Radial pulses are 2+ on the right side and 2+ on the left side.     Heart sounds: Murmur (3/6 systolic USB) heard.  Pulmonary:     Effort: Pulmonary effort is normal. No respiratory distress.     Breath sounds: Normal breath sounds. No wheezing, rhonchi or rales.  Abdominal:     General: Bowel sounds are normal. There is no distension.     Palpations: Abdomen is soft. There is no mass.     Tenderness: There is no abdominal tenderness. There is no guarding or rebound.     Hernia: No hernia is present.  Musculoskeletal:        General: Normal range of motion.     Cervical back: Normal range of motion and neck supple.     Right lower leg: No edema.     Left lower leg: No edema.  Lymphadenopathy:     Cervical: No cervical adenopathy.  Skin:    General: Skin is warm and dry.     Findings: No rash.     Comments: Vitiligo   Neurological:     General: No focal deficit present.     Mental Status: He is alert and oriented to person, place, and time.     Comments:  CN 2-12 grossly intact Recall 3/3  Calculation 5/5 DLROW  Psychiatric:        Mood and Affect: Mood normal.        Behavior: Behavior normal.        Thought Content: Thought content normal.        Judgment: Judgment normal.      Results  for orders placed or performed in visit on 03/02/21  Microalbumin / creatinine urine ratio  Result Value Ref Range   Microalb, Ur 1.3 0.0 - 1.9 mg/dL   Creatinine,U 147.6 mg/dL   Microalb Creat Ratio 0.9 0.0 - 30.0 mg/g  CBC with Differential/Platelet  Result Value Ref Range   WBC 7.4 4.0 - 10.5 K/uL   RBC 4.63 4.22 - 5.81 Mil/uL   Hemoglobin 15.2 13.0 - 17.0 g/dL   HCT 43.8 39.0 - 52.0 %   MCV 94.5 78.0 - 100.0 fl   MCHC 34.6 30.0 - 36.0 g/dL   RDW 13.6 11.5 - 15.5 %   Platelets 159.0 150.0 - 400.0 K/uL   Neutrophils Relative % 55.1 43.0 - 77.0 %   Lymphocytes Relative 29.0 12.0 - 46.0 %   Monocytes Relative 12.5 (H) 3.0 - 12.0 %   Eosinophils Relative 2.9 0.0 - 5.0 %   Basophils Relative 0.5 0.0 - 3.0 %   Neutro Abs 4.1 1.4 - 7.7 K/uL   Lymphs Abs 2.1 0.7 - 4.0 K/uL   Monocytes Absolute 0.9 0.1 - 1.0 K/uL   Eosinophils Absolute 0.2 0.0 - 0.7 K/uL   Basophils Absolute 0.0 0.0 - 0.1 K/uL  PSA  Result Value Ref Range   PSA 0.88 0.10 - 4.00 ng/mL  Hemoglobin A1c  Result Value Ref Range   Hgb A1c MFr Bld 5.8 4.6 - 6.5 %  Comprehensive metabolic panel  Result Value Ref Range   Sodium 140 135 - 145 mEq/L   Potassium 4.5 3.5 - 5.1 mEq/L   Chloride 105 96 - 112 mEq/L   CO2 27 19 - 32 mEq/L   Glucose, Bld 103 (H) 70 - 99 mg/dL   BUN 24 (H) 6 - 23 mg/dL   Creatinine, Ser  1.10 0.40 - 1.50 mg/dL   Total Bilirubin 0.9 0.2 - 1.2 mg/dL   Alkaline Phosphatase 78 39 - 117 U/L   AST 25 0 - 37 U/L   ALT 19 0 - 53 U/L   Total Protein 6.9 6.0 - 8.3 g/dL   Albumin 4.3 3.5 - 5.2 g/dL   GFR 65.74 >60.00 mL/min   Calcium 9.5 8.4 - 10.5 mg/dL  Lipid panel  Result Value Ref Range   Cholesterol 113 0 - 200 mg/dL   Triglycerides 159.0 (H) 0.0 - 149.0 mg/dL   HDL 31.30 (L) >39.00 mg/dL   VLDL 31.8 0.0 - 40.0 mg/dL   LDL Cholesterol 49 0 - 99 mg/dL   Total CHOL/HDL Ratio 4    NonHDL 81.22    Assessment & Plan:  This visit occurred during the SARS-CoV-2 public health emergency.  Safety  protocols were in place, including screening questions prior to the visit, additional usage of staff PPE, and extensive cleaning of exam room while observing appropriate contact time as indicated for disinfecting solutions.   Problem List Items Addressed This Visit     HYPERCHOLESTEROLEMIA    Chronic ,adequate on low dose statin - continue. The ASCVD Risk score Mikey Bussing DC Jr., et al., 2013) failed to calculate for the following reasons:   The valid total cholesterol range is 130 to 320 mg/dL        Recurrent nephrolithiasis    Remote. No recent flare.        Essential hypertension    Chronic, stable. Continue current regimen.        Sarcoidosis    H/o this by prior lung nodule biopsy (2015)       Lone atrial fibrillation (HCC)    Isolated episode of post op afib. Will continue to monitor for signs of recurrence.        S/P AVR (aortic valve replacement) and aortoplasty   Medicare annual wellness visit, subsequent - Primary    I have personally reviewed the Medicare Annual Wellness questionnaire and have noted 1. The patient's medical and social history 2. Their use of alcohol, tobacco or illicit drugs 3. Their current medications and supplements 4. The patient's functional ability including ADL's, fall risks, home safety risks and hearing or visual impairment. Cognitive function has been assessed and addressed as indicated.  5. Diet and physical activity 6. Evidence for depression or mood disorders The patients weight, height, BMI have been recorded in the chart. I have made referrals, counseling and provided education to the patient based on review of the above and I have provided the pt with a written personalized care plan for preventive services. Provider list updated.. See scanned questionairre as needed for further documentation. Reviewed preventative protocols and updated unless pt declined.        Prediabetes    Very mild A1c elevation. Encouraged limiting  sugars in diet.        [facility] examination    Preventative protocols reviewed and updated unless pt declined. Discussed healthy diet and lifestyle.        Thoracic aortic atherosclerosis (HCC)    Continue aspirin, statin.        Insomnia    Has tried and failed melatonin. Will trial trazodone 25-50mg  nightly PRN sleep - update with effect.        Leg cramps    Exercise related - encouraged good hydration status and reviewed importance of rehydration          Meds ordered  this encounter  Medications   albuterol (VENTOLIN HFA) 108 (90 Base) MCG/ACT inhaler    Sig: Inhale 2 puffs into the lungs every 6 (six) hours as needed for wheezing or shortness of breath.    Dispense:  54 g    Refill:  1   traZODone (DESYREL) 50 MG tablet    Sig: Take 0.5-1 tablets (25-50 mg total) by mouth at bedtime as needed for sleep.    Dispense:  30 tablet    Refill:  3    No orders of the defined types were placed in this encounter.   Patient instructions: If interested, check with pharmacy about new 2 shot shingles series (shingrix).  Try trazodone 25-50mg  nightly as needed for sleep You are doing well today  Continue current medicines Return as needed or in 1 year for next physical/wellness visit  Follow up plan: Return if symptoms worsen or fail to improve, for annual exam, prior fasting for blood work, medicare wellness visit.  Ria Bush, MD

## 2021-03-09 NOTE — Assessment & Plan Note (Signed)

## 2021-03-09 NOTE — Assessment & Plan Note (Addendum)
Very mild A1c elevation. Encouraged limiting sugars in diet.

## 2021-03-09 NOTE — Assessment & Plan Note (Signed)
Continue aspirin, statin.  

## 2021-03-09 NOTE — Assessment & Plan Note (Signed)
Preventative protocols reviewed and updated unless pt declined. Discussed healthy diet and lifestyle.  

## 2021-03-09 NOTE — Assessment & Plan Note (Signed)
Chronic ,adequate on low dose statin - continue. The ASCVD Risk score Mikey Bussing DC Jr., et al., 2013) failed to calculate for the following reasons:   The valid total cholesterol range is 130 to 320 mg/dL

## 2021-03-09 NOTE — Assessment & Plan Note (Addendum)
Chronic, stable. Continue current regimen. 

## 2021-03-09 NOTE — Assessment & Plan Note (Addendum)
Exercise related - encouraged good hydration status and reviewed importance of rehydration

## 2021-03-23 ENCOUNTER — Ambulatory Visit: Payer: Medicare HMO

## 2021-04-01 ENCOUNTER — Other Ambulatory Visit: Payer: Self-pay | Admitting: Family Medicine

## 2021-04-02 NOTE — Telephone Encounter (Signed)
Ok to send 90-day rx?

## 2021-04-03 NOTE — Telephone Encounter (Signed)
ERx 

## 2021-05-12 ENCOUNTER — Other Ambulatory Visit: Payer: Self-pay | Admitting: Cardiology

## 2021-06-03 ENCOUNTER — Encounter: Payer: Self-pay | Admitting: Family Medicine

## 2021-06-04 DIAGNOSIS — H5213 Myopia, bilateral: Secondary | ICD-10-CM | POA: Diagnosis not present

## 2021-06-09 DIAGNOSIS — M19011 Primary osteoarthritis, right shoulder: Secondary | ICD-10-CM | POA: Diagnosis not present

## 2021-07-07 ENCOUNTER — Ambulatory Visit: Payer: Medicare HMO | Attending: Internal Medicine

## 2021-07-07 ENCOUNTER — Other Ambulatory Visit: Payer: Self-pay

## 2021-07-07 DIAGNOSIS — Z23 Encounter for immunization: Secondary | ICD-10-CM

## 2021-07-07 MED ORDER — INFLUENZA VAC A&B SA ADJ QUAD 0.5 ML IM PRSY
PREFILLED_SYRINGE | INTRAMUSCULAR | 0 refills | Status: DC
  Filled 2021-07-07: qty 0.5, 1d supply, fill #0

## 2021-07-07 MED ORDER — PFIZER COVID-19 VAC BIVALENT 30 MCG/0.3ML IM SUSP
INTRAMUSCULAR | 0 refills | Status: DC
  Filled 2021-07-07: qty 0.3, 1d supply, fill #0

## 2021-07-07 NOTE — Progress Notes (Signed)
   Covid-19 Vaccination Clinic  Name:  Jeremy Meadows    MRN: [id number] DOB: Aug 13, 1946  07/07/2021  [name] was observed post Covid-19 immunization for 15 minutes without incident. He was provided with Vaccine Information Sheet and instruction to access the V-Safe system.   [name] was instructed to call 911 with any severe reactions post vaccine: Difficulty breathing  Swelling of face and throat  A fast heartbeat  A bad rash all over body  Dizziness and weakness   Lu Duffel, PharmD, MBA Clinical Acute Care Pharmacist

## 2021-07-29 ENCOUNTER — Encounter: Payer: Self-pay | Admitting: Family Medicine

## 2021-07-29 NOTE — Telephone Encounter (Signed)
Pts chart has been updated.

## 2021-07-31 ENCOUNTER — Encounter: Payer: Self-pay | Admitting: Family Medicine

## 2021-07-31 ENCOUNTER — Telehealth (INDEPENDENT_AMBULATORY_CARE_PROVIDER_SITE_OTHER): Payer: Medicare HMO | Admitting: Family Medicine

## 2021-07-31 ENCOUNTER — Other Ambulatory Visit: Payer: Self-pay

## 2021-07-31 DIAGNOSIS — J4520 Mild intermittent asthma, uncomplicated: Secondary | ICD-10-CM | POA: Diagnosis not present

## 2021-07-31 MED ORDER — PREDNISONE 20 MG PO TABS: ORAL_TABLET | ORAL | 0 refills | Status: DC

## 2021-07-31 MED ORDER — AZITHROMYCIN 250 MG PO TABS: ORAL_TABLET | ORAL | 0 refills | Status: DC

## 2021-07-31 MED ORDER — CHERATUSSIN AC 100-10 MG/5ML PO SOLN: 5.0000 mL | Freq: Two times a day (BID) | ORAL | 0 refills | Status: DC | PRN

## 2021-07-31 NOTE — Telephone Encounter (Signed)
Recommend virtual visit - seeing if he can do 4pm today.

## 2021-07-31 NOTE — Assessment & Plan Note (Addendum)
Anticipate acute exacerbation of asthmatic bronchitis. Rx prednisone taper with routine steroid precautions, cheratussin for night time cough. WASP Rx for zpack al sosent in with indications on when to fill.  Supportive care measures reviewed. Update if not improving with treatment. Pt agrees with plan.

## 2021-07-31 NOTE — Progress Notes (Signed)
Patient ID: Jeremy Meadows, male    DOB: 21-Nov-1945, 75 y.o.   MRN: [id number]  Virtual visit completed through [place], a video enabled telemedicine application. Due to national recommendations of social distancing due to COVID-19, a virtual visit is felt to be most appropriate for this patient at this time. Reviewed limitations, risks, security and privacy concerns of performing a virtual visit and the availability of in person appointments. I also reviewed that there may be a patient responsible charge related to this service. The patient agreed to proceed.   Patient location: home Provider location: [place] at [facility], office Persons participating in this virtual visit: patient, provider   If any vitals were documented, they were collected by patient at home unless specified below.    BP 114/86   Pulse 68   Temp 98.6 F (37 C)   Ht 5\' 11"  (1.803 m)   Wt 112 lb (50.8 kg)   BMI 15.62 kg/m    CC: cough Subjective:   HPI: Jeremy Meadows is a 75 y.o. male presenting on 07/31/2021 for Cough (C/o prod cough with greenish/yellow phlegm and sore throat.  Sxs started 07/27/21.  Tried Dayquil/Nyquil for HTN, not helpful.  Cough is now disturbing sleep. )   5d h/o cough, chest > head congestion, colored phlegm, ST. Cough seems to be getting progressively worse, affecting sleep. Abd soreness from cough.   No fevers/chills, dyspnea or wheezing, ear or tooth pain, abd pain, nausea, diarrhea or facial pain.  He received flu shot and bivalent COVID booster early October.  Last week went on vacation with family to [place].   Tried dayquil and nyquil.   No sick contacts at home.  Nonsmoker No h/o COPD.  H/o exercise induced asthma on PRN albuterol as well as h/o pneumonia.  H/o OSA - not using CPAP, overall sleeps well.      Relevant past medical, surgical, family and social history reviewed and updated as indicated. Interim medical history since our last visit  reviewed. Allergies and medications reviewed and updated. Outpatient Medications Prior to Visit  Medication Sig Dispense Refill   albuterol (VENTOLIN HFA) 108 (90 Base) MCG/ACT inhaler Inhale 2 puffs into the lungs every 6 (six) hours as needed for wheezing or shortness of breath. 54 g 1   amLODipine (NORVASC) 2.5 MG tablet TAKE 1 TABLET EVERY DAY 90 tablet 0   aspirin 81 MG tablet Take 81 mg by mouth daily.     atorvastatin (LIPITOR) 10 MG tablet TAKE 1 TABLET EVERY DAY AT 6PM 90 tablet 0   cholecalciferol (VITAMIN D) 1000 UNITS tablet Take 1,000 Units by mouth daily.     dorzolamide (TRUSOPT) 2 % ophthalmic solution Apply 1 drop to eye 2 (two) times daily.     metoprolol tartrate (LOPRESSOR) 50 MG tablet Take 1 tablet (50 mg total) by mouth 2 (two) times daily. 180 tablet 2   traZODone (DESYREL) 50 MG tablet TAKE 0.5-1 TABLETS BY MOUTH AT BEDTIME AS NEEDED FOR SLEEP. 90 tablet 3   vitamin C (ASCORBIC ACID) 500 MG tablet Take 500 mg by mouth daily.      COVID-19 mRNA bivalent vaccine, Pfizer, (PFIZER COVID-19 VAC BIVALENT) injection Inject into the muscle. 0.3 mL 0   influenza vaccine adjuvanted (FLUAD) 0.5 ML injection Inject into the muscle. 0.5 mL 0   Facility-Administered Medications Prior to Visit  Medication Dose Route Frequency Provider Last Rate Last Admin   0.9 %  sodium chloride infusion  500 mL  Intravenous Continuous [name], MD         Per HPI unless specifically indicated in ROS section below Review of Systems Objective:  BP 114/86   Pulse 68   Temp 98.6 F (37 C)   Ht 5\' 11"  (1.803 m)   Wt 112 lb (50.8 kg)   BMI 15.62 kg/m   Wt Readings from Last 3 Encounters:  07/31/21 112 lb (50.8 kg)  03/09/21 207 lb 7 oz (94.1 kg)  07/03/20 214 lb 9.6 oz (97.3 kg)       Physical exam: Gen: alert, NAD, not ill appearing Pulm: speaks in complete sentences without increased work of breathing Psych: normal mood, normal thought content      Assessment & Plan:    Problem List Items Addressed This Visit     Asthmatic bronchitis    Anticipate acute exacerbation of asthmatic bronchitis. Rx prednisone taper with routine steroid precautions, cheratussin for night time cough. WASP Rx for zpack al sosent in with indications on when to fill.  Supportive care measures reviewed. Update if not improving with treatment. Pt agrees with plan.       Relevant Medications   predniSONE (DELTASONE) 20 MG tablet     Meds ordered this encounter  Medications   azithromycin (ZITHROMAX) 250 MG tablet    Sig: Take two tablets on day one followed by one tablet on days 2-5    Dispense:  6 each    Refill:  0   predniSONE (DELTASONE) 20 MG tablet    Sig: Take two tablets daily for 3 days followed by one tablet daily for 4 days    Dispense:  10 tablet    Refill:  0   guaiFENesin-codeine (CHERATUSSIN AC) 100-10 MG/5ML syrup    Sig: Take 5 mLs by mouth 2 (two) times daily as needed for cough (sedation precautions).    Dispense:  120 mL    Refill:  0    No orders of the defined types were placed in this encounter.   I discussed the assessment and treatment plan with the patient. The patient was provided an opportunity to ask questions and all were answered. The patient agreed with the plan and demonstrated an understanding of the instructions. The patient was advised to call back or seek an in-person evaluation if the symptoms worsen or if the condition fails to improve as anticipated.  Follow up plan: Return if symptoms worsen or fail to improve.  Ria Bush, MD

## 2021-08-06 ENCOUNTER — Ambulatory Visit (INDEPENDENT_AMBULATORY_CARE_PROVIDER_SITE_OTHER)
Admission: RE | Admit: 2021-08-06 | Discharge: 2021-08-06 | Disposition: A | Discharge status: Home / Self Care | Payer: Medicare HMO | Source: Ambulatory Visit | Attending: Nurse Practitioner | Admitting: Nurse Practitioner

## 2021-08-06 ENCOUNTER — Encounter: Payer: Self-pay | Admitting: Family Medicine

## 2021-08-06 ENCOUNTER — Ambulatory Visit (INDEPENDENT_AMBULATORY_CARE_PROVIDER_SITE_OTHER): Payer: Medicare HMO | Admitting: Nurse Practitioner

## 2021-08-06 ENCOUNTER — Other Ambulatory Visit: Payer: Self-pay

## 2021-08-06 ENCOUNTER — Encounter: Payer: Self-pay | Admitting: Nurse Practitioner

## 2021-08-06 VITALS — BP 132/84 | HR 69 | Temp 98.2°F | Resp 14 | Ht 71.0 in | Wt 214.2 lb

## 2021-08-06 DIAGNOSIS — R059 Cough, unspecified: Secondary | ICD-10-CM | POA: Diagnosis not present

## 2021-08-06 DIAGNOSIS — R051 Acute cough: Secondary | ICD-10-CM | POA: Diagnosis not present

## 2021-08-06 DIAGNOSIS — J069 Acute upper respiratory infection, unspecified: Secondary | ICD-10-CM | POA: Diagnosis not present

## 2021-08-06 DIAGNOSIS — J45909 Unspecified asthma, uncomplicated: Secondary | ICD-10-CM | POA: Diagnosis not present

## 2021-08-06 MED ORDER — FLUTICASONE PROPIONATE 50 MCG/ACT NA SUSP: 2.0000 | Freq: Every day | NASAL | 0 refills | Status: DC

## 2021-08-06 NOTE — Assessment & Plan Note (Signed)
Can continue taking Cheratussin as prescribed from previous provider.  We will get a chest x-ray as patient does not seem to have improved with a Z-Pak, prednisone, Cheratussin.  Does have a history of asthma has an inhaler duration is only has used it once a year has been having issues with several times a day as of late.  Used to see pulmonology prior to them retiring and [facility] traditionally worked.  Curious if this is viral and not bacterial.  But given length of time patient started improving not continuing to stay the same or worsen.  Pending chest x-ray start Flonase for what seems to be a postnasal drip on exam.

## 2021-08-06 NOTE — Telephone Encounter (Addendum)
Spoke with pt scheduling OV today at 3:20 with [facility].  FYI to Dr. Darnell Level.

## 2021-08-06 NOTE — Telephone Encounter (Signed)
Please call to schedule in-office visit at [facility] or [facility]

## 2021-08-06 NOTE — Patient Instructions (Signed)
Nice to see you today Will follow up once I get the chest xray results I am going to send in some nose spray to see if that helps with the cough too

## 2021-08-06 NOTE — Assessment & Plan Note (Signed)
History of the same traditionally treated with Z-Pak and resolves.  Has not resolved per patient report does have asthma requiring increased use of inhaler.  No shortness of breath sitting still just DOE.  Pending chest x-ray continue taking medications as prescribed including Cheratussin.  Continue to monitor

## 2021-08-06 NOTE — Progress Notes (Signed)
Acute Office Visit  Subjective:    Patient ID: Jeremy Meadows, male    DOB: [birth year], 75 y.o.   MRN: [id number]  Chief Complaint  Patient presents with   Cough    Symptoms started around 07/27/21. Cough has gotten worse overnight-coughing up yellowish/greening mucus, chest rattling and wheezing, chest congestion. No fever. Took Zpak and Prednisone. Covid test  was negative about a week ago.      Patient is in today for cough  Was seen virtaully and given zpak and cherritussin  Worse with laying down. Does sit up in recliner to rest States that it is dry hacking and then develops into a productive cough. States that he is still continuing to take the prescribed cough medication but is having soreness due to the cough  Finished zpak yesterday and prensidone finished this morning   Unable to see if his symptoms have improved or changer per his report  Been using the inhaler several times a day. Prior to illness he has been using inhaler once a year with exercise.   Past Medical History:  Diagnosis Date   Allergic rhinitis, cause unspecified    Aortic insufficiency    s/p AVR 2015   Aortic root enlargement (HCC)    thoracic aorta   Aortic valve stenosis 2015   s/p AVR 2015 Jeremy Meadows) - completed cardiac rehab 08/2014   Arthritis    Asthma    Atrial fibrillation ([place]) 07/23/2014   Benign neoplasm of colon    Gallstones    by CT   History of kidney stones    History of nephrolithiasis    HLD (hyperlipidemia)    Hypertension    Need for prophylactic antibiotic    Obstructive sleep apnea (adult) (pediatric)    pt. doesn't use his machine at home.   Other testicular hypofunction    Pulmonary nodule, left 2015   2cm, ?sarcoid by biopsy   Unspecified glaucoma(365.9)     Past Surgical History:  Procedure Laterality Date   AORTIC VALVE REPLACEMENT N/A 07/02/2014   Procedure: AORTIC VALVE REPLACEMENT (AVR) with 23 Aortic Magna Ease;  Surgeon: Jeremy Isaac, MD    ASCENDING AORTIC ROOT REPLACEMENT N/A 07/02/2014   Procedure: supra coronary  ASCENDING AORTIC REPLACEMENT to the Inominate Artery with 88mm Hemashield Platinum with  circulatory arrest;  Surgeon: Jeremy Isaac, MD   CARDIAC CATHETERIZATION     CARDIAC VALVE REPLACEMENT     CATARACT EXTRACTION Bilateral    COLONOSCOPY  2013   WNL, rpt 5 yrs Jeremy Meadows)   COLONOSCOPY  2018   1 TA, diverticulosis, rpt 5 yrs Jeremy Meadows)   EYE SURGERY     INTRAOPERATIVE TRANSESOPHAGEAL ECHOCARDIOGRAM N/A 07/02/2014   JOINT REPLACEMENT Right 14   partial replacement   KNEE SURGERY Right 05/2012   arthroscopic   KNEE SURGERY Right 12/2013   patella   LEFT AND RIGHT HEART CATHETERIZATION WITH CORONARY ANGIOGRAM N/A 05/22/2014   no significant obstructive CAD (Jeremy Meadows)   LITHOTRIPSY     LUNG BIOPSY Left 2015   by IR - benign   MEDIAL PARTIAL KNEE REPLACEMENT Right 03/2013   RHINOPLASTY  1980s   VENTRAL HERNIA REPAIR  08/26/2015   VENTRAL HERNIA REPAIR N/A 08/26/2015   Procedure: VENTRAL HERNIA REPAIR WITH MESH ADULT;  Surgeon: Jeremy Isaac, MD;  Location: [facility] OR;  Service: Vascular;  Laterality: N/A;    Family History  Problem Relation Age of Onset   Cancer Mother  Jaw   CAD Father 43       MI   Heart disease Brother    Heart disease Brother    Heart disease Brother    Colon cancer Neg Hx    Colon polyps Neg Hx    Esophageal cancer Neg Hx    Rectal cancer Neg Hx    Stomach cancer Neg Hx     Social History   Socioeconomic History   Marital status: Married    Spouse name: Not on file   Number of children: 2   Years of education: Not on file   Highest education level: Not on file  Occupational History   Occupation: retired  Tobacco Use   Smoking status: Never   Smokeless tobacco: Never  Vaping Use   Vaping Use: Never used  Substance and Sexual Activity   Alcohol use: No    Alcohol/week: 0.0 standard drinks   Drug use: No   Sexual activity: Yes  Other Topics Concern   Not on file   Social History Narrative   Lives with [name] in [employer] - no agent orange exposure   Occupation: retired, was in [employer], 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   Activity: softball, walking   Diet: good water, fruits/vegetables daily   Social Determinants of Health   Financial Resource Strain: Not on file  Food Insecurity: Not on file  Transportation Needs: Not on file  Physical Activity: Not on file  Stress: Not on file  Social Connections: Not on file  Intimate Partner Violence: Not on file    Outpatient Medications Prior to Visit  Medication Sig Dispense Refill   albuterol (VENTOLIN HFA) 108 (90 Base) MCG/ACT inhaler Inhale 2 puffs into the lungs every 6 (six) hours as needed for wheezing or shortness of breath. 54 g 1   amLODipine (NORVASC) 2.5 MG tablet TAKE 1 TABLET EVERY DAY 90 tablet 0   aspirin 81 MG tablet Take 81 mg by mouth daily.     atorvastatin (LIPITOR) 10 MG tablet TAKE 1 TABLET EVERY DAY AT 6PM 90 tablet 0   cholecalciferol (VITAMIN D) 1000 UNITS tablet Take 1,000 Units by mouth daily.     dorzolamide (TRUSOPT) 2 % ophthalmic solution Apply 1 drop to eye 2 (two) times daily.     guaiFENesin-codeine (CHERATUSSIN AC) 100-10 MG/5ML syrup Take 5 mLs by mouth 2 (two) times daily as needed for cough (sedation precautions). 120 mL 0   metoprolol tartrate (LOPRESSOR) 50 MG tablet Take 1 tablet (50 mg total) by mouth 2 (two) times daily. 180 tablet 2   vitamin C (ASCORBIC ACID) 500 MG tablet Take 500 mg by mouth daily.      azithromycin (ZITHROMAX) 250 MG tablet Take two tablets on day one followed by one tablet on days 2-5 6 each 0   predniSONE (DELTASONE) 20 MG tablet Take two tablets daily for 3 days followed by one tablet daily for 4 days 10 tablet 0   traZODone (DESYREL) 50 MG tablet TAKE 0.5-1 TABLETS BY MOUTH AT BEDTIME AS NEEDED FOR SLEEP. 90 tablet 3   Facility-Administered Medications Prior to Visit  Medication Dose Route Frequency Provider Last Rate Last Admin   0.9 %  sodium  chloride infusion  500 mL Intravenous Continuous Jeremy Artist, MD        Allergies  Allergen Reactions   Ace Inhibitors Cough    Hacking cough    Review of Systems  Constitutional:  Positive for fatigue. Negative for chills and fever.  HENT:  Negative for congestion, ear discharge, ear pain, sinus pressure, sinus pain and sore throat.   Respiratory:  Positive for cough (yellow green and clear) and shortness of breath.   Cardiovascular:  Negative for chest pain.  Gastrointestinal:  Negative for abdominal pain, diarrhea, nausea and vomiting.  Neurological:  Positive for headaches.      Objective:    Physical Exam HENT:     Right Ear: Tympanic membrane, ear canal and external ear normal.     Left Ear: Tympanic membrane, ear canal and external ear normal.     Nose:     Right Sinus: No maxillary sinus tenderness or frontal sinus tenderness.     Left Sinus: No maxillary sinus tenderness or frontal sinus tenderness.     Mouth/Throat:     Mouth: Mucous membranes are moist.     Comments: Cobblestoning  Cardiovascular:     Rate and Rhythm: Normal rate and regular rhythm.  Pulmonary:     Effort: Pulmonary effort is normal.     Comments: Decreased breath sounds Abdominal:     General: Bowel sounds are normal.  Skin:    General: Skin is warm.  Psychiatric:        Mood and Affect: Mood normal.        Behavior: Behavior normal.        Thought Content: Thought content normal.        Judgment: Judgment normal.    BP 132/84   Pulse 69   Temp 98.2 F (36.8 C)   Resp 14   Ht 5\' 11"  (1.803 m)   Wt 214 lb 4 oz (97.2 kg)   SpO2 96%   BMI 29.88 kg/m  Wt Readings from Last 3 Encounters:  08/06/21 214 lb 4 oz (97.2 kg)  07/31/21 112 lb (50.8 kg)  03/09/21 207 lb 7 oz (94.1 kg)    Health Maintenance Due  Topic Date Due   Zoster Vaccines- Shingrix (1 of 2) Never done    There are no preventive care reminders to display for this patient.   Lab Results  Component Value  Date   TSH 2.09 08/15/2019   Lab Results  Component Value Date   WBC 7.4 03/02/2021   HGB 15.2 03/02/2021   HCT 43.8 03/02/2021   MCV 94.5 03/02/2021   PLT 159.0 03/02/2021   Lab Results  Component Value Date   NA 140 03/02/2021   K 4.5 03/02/2021   CO2 27 03/02/2021   GLUCOSE 103 (H) 03/02/2021   BUN 24 (H) 03/02/2021   CREATININE 1.10 03/02/2021   BILITOT 0.9 03/02/2021   ALKPHOS 78 03/02/2021   AST 25 03/02/2021   ALT 19 03/02/2021   PROT 6.9 03/02/2021   ALBUMIN 4.3 03/02/2021   CALCIUM 9.5 03/02/2021   ANIONGAP 8 03/31/2017   GFR 65.74 03/02/2021   Lab Results  Component Value Date   CHOL 113 03/02/2021   Lab Results  Component Value Date   HDL 31.30 (L) 03/02/2021   Lab Results  Component Value Date   LDLCALC 49 03/02/2021   Lab Results  Component Value Date   TRIG 159.0 (H) 03/02/2021   Lab Results  Component Value Date   CHOLHDL 4 03/02/2021   Lab Results  Component Value Date   HGBA1C 5.8 03/02/2021       Assessment & Meadows:   Problem List Items Addressed This Visit       Respiratory   Upper respiratory tract infection  History of the same traditionally treated with Z-Pak and resolves.  Has not resolved per patient report does have asthma requiring increased use of inhaler.  No shortness of breath sitting still just DOE.  Pending chest x-ray continue taking medications as prescribed including Cheratussin.  Continue to monitor        Other   Acute cough - Primary    Can continue taking Cheratussin as prescribed from previous provider.  We will get a chest x-ray as patient does not seem to have improved with a Z-Pak, prednisone, Cheratussin.  Does have a history of asthma has an inhaler duration is only has used it once a year has been having issues with several times a day as of late.  Used to see pulmonology prior to them retiring and [place] traditionally worked.  Curious if this is viral and not bacterial.  But given length of time patient  started improving not continuing to stay the same or worsen.  Pending chest x-ray start Flonase for what seems to be a postnasal drip on exam.      Relevant Medications   fluticasone (FLONASE) 50 MCG/ACT nasal spray   Other Relevant Orders   DG Chest 2 View (Completed)     No orders of the defined types were placed in this encounter.  This visit occurred during the SARS-CoV-2 public health emergency.  Safety protocols were in place, including screening questions prior to the visit, additional usage of staff PPE, and extensive cleaning of exam room while observing appropriate contact time as indicated for disinfecting solutions.   Romilda Garret, NP

## 2021-08-07 ENCOUNTER — Other Ambulatory Visit: Payer: Self-pay | Admitting: Nurse Practitioner

## 2021-08-07 DIAGNOSIS — J180 Bronchopneumonia, unspecified organism: Secondary | ICD-10-CM

## 2021-08-07 MED ORDER — AMOXICILLIN-POT CLAVULANATE 875-125 MG PO TABS: 1.0000 | ORAL_TABLET | Freq: Two times a day (BID) | ORAL | 0 refills | Status: AC

## 2021-08-07 NOTE — Progress Notes (Signed)
Called and reviewed the result. Discussed that the findings could be before he was started on abx. Did discuss different options which included watchful waiting or treating with a different abx. Given that he has not improved he would like to do the abx. Will send in Augmentin since he was on a macrolide already. If he does not improve will have him follow up with PCP. S&S reviewed as when to seek emergent or urgent health care

## 2021-08-10 ENCOUNTER — Telehealth: Payer: Self-pay | Admitting: Family Medicine

## 2021-08-10 NOTE — Chronic Care Management (AMB) (Signed)
  Chronic Care Management   Note  08/10/2021 Name: Jeremy Meadows MRN: [id number] DOB: 21-Nov-1945  Jeremy Meadows is a 75 y.o. year old male who is a primary care patient of Ria Bush, MD. I reached out to Jeremy Meadows by phone today in response to a referral sent by Jeremy Meadows's PCP, Ria Bush, MD.   Jeremy Meadows was given information about Chronic Care Management services today including:  CCM service includes personalized support from designated clinical staff supervised by his physician, including individualized plan of care and coordination with other care providers 24/7 contact phone numbers for assistance for urgent and routine care needs. Service will only be billed when office clinical staff spend 20 minutes or more in a month to coordinate care. Only one practitioner may furnish and bill the service in a calendar month. The patient may stop CCM services at any time (effective at the end of the month) by phone call to the office staff.   Patient agreed to services and verbal consent obtained.   Follow up plan:   Tatjana Secretary/administrator

## 2021-09-02 ENCOUNTER — Telehealth: Payer: Self-pay

## 2021-09-02 NOTE — Progress Notes (Signed)
Chronic Care Management Pharmacy Assistant   Name: Jeremy Meadows  MRN: [id number] DOB: [birth year]  Reason for Encounter: CCM (Initial Questions)  Recent office visits:  11//12/2020 - Ria Bush, MD - Video Visit - Patient presented for cough. Anticipate acute exacerbation of asthmatic bronchitis. Start: azithromycin (ZITHROMAX) 250 MG tablet for five days. Start: guaiFENesin-codeine (CHERATUSSIN AC) 100-10 MG/5ML syrup PRN for cough. Start: predniSONE (DELTASONE) 20 MG tablet for seven days.  03/09/2021 - Ria Bush, MD - Patient presented for Annual Wellness Visit. Start: traZODone (DESYREL) 50 MG tablet at bedtime PRN for sleep. No lab work reported.   Recent consult visits:  08/07/2021 - Karl Ito, NP - Pain Medicine - Orders only - Start: amoxicillin-clavulanate (AUGMENTIN) 875-125 MG tablet for seven days.  08/06/2021 - Karl Ito, NP - Pain Medicine - Patient presented for cough. Labs: Chest x-ray 2 view. Start: fluticasone (FLONASE) 50 MCG/ACT nasal spray.  07/07/2021 - [facility] Vacc[facility] - Patient presented for Immunization: Pension scheme manager 4yrs & up.  06/09/2021 - [facility] - Orthopedic Surgery - Patient presented for Osteoarthritis, right shoulder. Procedure: X-ray shoulder 2+ VW. No other information.  06/04/2021 - [place] - Patient presented for Myopia, bilateral. Procedure: Routine Ophthalmological exam. No other information.   Hospital visits:  None in previous 6 months  Medications: Outpatient Encounter Medications as of 09/02/2021  Medication Sig   albuterol (VENTOLIN HFA) 108 (90 Base) MCG/ACT inhaler Inhale 2 puffs into the lungs every 6 (six) hours as needed for wheezing or shortness of breath.   amLODipine (NORVASC) 2.5 MG tablet TAKE 1 TABLET EVERY DAY   aspirin 81 MG tablet Take 81 mg by mouth daily.   atorvastatin (LIPITOR) 10 MG tablet TAKE 1 TABLET EVERY DAY AT 6PM   cholecalciferol  (VITAMIN D) 1000 UNITS tablet Take 1,000 Units by mouth daily.   dorzolamide (TRUSOPT) 2 % ophthalmic solution Apply 1 drop to eye 2 (two) times daily.   fluticasone (FLONASE) 50 MCG/ACT nasal spray Place 2 sprays into both nostrils daily.   guaiFENesin-codeine (CHERATUSSIN AC) 100-10 MG/5ML syrup Take 5 mLs by mouth 2 (two) times daily as needed for cough (sedation precautions).   metoprolol tartrate (LOPRESSOR) 50 MG tablet Take 1 tablet (50 mg total) by mouth 2 (two) times daily.   vitamin C (ASCORBIC ACID) 500 MG tablet Take 500 mg by mouth daily.    Facility-Administered Encounter Medications as of 09/02/2021  Medication   0.9 %  sodium chloride infusion   Lab Results  Component Value Date/Time   HGBA1C 5.8 03/02/2021 08:28 AM   HGBA1C 5.6 08/15/2019 09:35 AM   MICROALBUR 1.3 03/02/2021 08:28 AM   MICROALBUR 2.1 (H) 08/15/2019 09:36 AM    BP Readings from Last 3 Encounters:  08/06/21 132/84  07/31/21 114/86  03/09/21 122/74   Patient contacted to review initial questions prior to visit with Charlene Brooke.  Have you seen any other providers since your last visit with PCP? Yes  Any changes in your medications or health? Yes - asthmatic bronchitis. Has completed courses for medications.   Any side effects from any medications? No  Do you have an symptoms or problems not managed by your medications? No  Any concerns about your health right now? No  Has your provider asked that you check blood pressure, blood sugar, or follow special diet at home? No  Do you get any type of exercise on a regular basis? Yes - Walks as weather permits.  Weight and band exercises every day and plays softball March - November.   Can you think of a goal you would like to reach for your health? Yes - Patient states he just wants to stay upright.   Do you have any problems getting your medications? No  Is there anything that you would like to discuss during the appointment? No  Spoke with  patient and reminded them to have all medications, supplements and any blood glucose and blood pressure readings available for review with pharmacist, at their telephone visit on 09/07/2021 at 9:00.   Star Rating Drugs:  Medication:  Last Fill: Day Supply Atorvastatin 10 mg 07/25/2021 90   Fill date verified with [place] Gaps: Annual wellness visit in last year? Yes 03/09/2021  Most Recent BP reading: 132/84 on 08/06/2021  Charlene Brooke, CPP notified  Marijean Niemann, [place] Clinical Pharmacy Assistant [phone]  Time Spent:  40 Minutes

## 2021-09-07 ENCOUNTER — Other Ambulatory Visit: Payer: Self-pay

## 2021-09-07 ENCOUNTER — Ambulatory Visit: Payer: Medicare HMO | Admitting: Pharmacist

## 2021-09-07 DIAGNOSIS — I10 Essential (primary) hypertension: Secondary | ICD-10-CM

## 2021-09-07 DIAGNOSIS — E7800 Pure hypercholesterolemia, unspecified: Secondary | ICD-10-CM

## 2021-09-07 DIAGNOSIS — I4891 Unspecified atrial fibrillation: Secondary | ICD-10-CM

## 2021-09-07 DIAGNOSIS — I700 Atherosclerosis of aorta: Secondary | ICD-10-CM

## 2021-09-07 NOTE — Progress Notes (Signed)
Chronic Care Management Pharmacy Note  09/07/2021 Name:  SHAFIN POLLIO MRN:  [id number] DOB:  1945/10/14  Summary: -Pt endorses compliance with medications as prescribed. He denies side effects/issues  Recommendations/Changes made from today's visit: -Advised Shingrix vaccine - pt is planning to get after Jan 1st  Plan: -[place] will call patient in 6 months for general adherence -Pharmacist follow up televisit scheduled for 1 year   Subjective: DEVINE KLINGEL is an 75 y.o. year old male who is a primary patient of Ria Bush, MD.  The CCM team was consulted for assistance with disease management and care coordination needs.    Engaged with patient by telephone for initial visit in response to provider referral for pharmacy case management and/or care coordination services.   Consent to Services:  The patient was given the following information about Chronic Care Management services today, agreed to services, and gave verbal consent: 1. CCM service includes personalized support from designated clinical staff supervised by the primary care provider, including individualized plan of care and coordination with other care providers 2. 24/7 contact phone numbers for assistance for urgent and routine care needs. 3. Service will only be billed when office clinical staff spend 20 minutes or more in a month to coordinate care. 4. Only one practitioner may furnish and bill the service in a calendar month. 5.The patient may stop CCM services at any time (effective at the end of the month) by phone call to the office staff. 6. The patient will be responsible for cost sharing (co-pay) of up to 20% of the service fee (after annual deductible is met). Patient agreed to services and consent obtained.  Patient Care Team: Ria Bush, MD as PCP - General (Family Medicine) [name], Peter M, MD as Consulting Physician (Cardiology) Grace Isaac, MD (Inactive) as  Consulting Physician (Cardiothoracic Surgery) Charlton Haws, [facility] as Pharmacist (Pharmacist)  Patient lives at home with his wife. He is fairly active and plays softball March-November.  Recent office visits: 07/31/2021 - Ria Bush, MD - Video Visit - Patient presented for cough. Anticipate acute exacerbation of asthmatic bronchitis. Start: azithromycin (ZITHROMAX) 250 MG tablet for five days. Start: Cherratussin, prednixone 20 mg x 7 days.   03/09/2021 - Ria Bush, MD - Patient presented for Annual Wellness Visit. Start: traZODone (DESYREL) 50 MG tablet at bedtime PRN for sleep. No lab work reported.   Recent consult visits: 08/06/2021 - Karl Ito, NP - Pain Medicine - Patient presented for cough. Chest Xray c/w bronchopneumonia. Start: Flonase, Augmentin 875-125 mg x 7 days.  07/07/2021 - Normal booster given.  06/09/2021 - Vonna Drafts - Orthopedic Surgery - Patient presented for Osteoarthritis, right shoulder. Procedure: X-ray shoulder 2+ VW. No other information.   06/04/2021 - [place] - Patient presented for Myopia, bilateral. Procedure: Routine Ophthalmological exam. No other information.   Hospital visits: None in previous 6 months   Objective:  Lab Results  Component Value Date   CREATININE 1.10 03/02/2021   BUN 24 (H) 03/02/2021   GFR 65.74 03/02/2021   GFRNONAA 52 (L) 05/30/2018   GFRAA 60 05/30/2018   NA 140 03/02/2021   K 4.5 03/02/2021   CALCIUM 9.5 03/02/2021   CO2 27 03/02/2021   GLUCOSE 103 (H) 03/02/2021    Lab Results  Component Value Date/Time   HGBA1C 5.8 03/02/2021 08:28 AM   HGBA1C 5.6 08/15/2019 09:35 AM   GFR 65.74 03/02/2021 08:28 AM   GFR  55.97 (L) 08/15/2019 09:35 AM   MICROALBUR 1.3 03/02/2021 08:28 AM   MICROALBUR 2.1 (H) 08/15/2019 09:36 AM    Last diabetic Eye exam: No results found for: HMDIABEYEEXA  Last diabetic Foot exam: No results found for: HMDIABFOOTEX   Lab  Results  Component Value Date   CHOL 113 03/02/2021   HDL 31.30 (L) 03/02/2021   LDLCALC 49 03/02/2021   LDLDIRECT 67.0 08/15/2019   TRIG 159.0 (H) 03/02/2021   CHOLHDL 4 03/02/2021    Hepatic Function Latest Ref Rng & Units 03/02/2021 08/15/2019 05/30/2018  Total Protein 6.0 - 8.3 g/dL 6.9 7.0 7.3  Albumin 3.5 - 5.2 g/dL 4.3 4.4 4.5  AST 0 - 37 U/L 25 34 26  ALT 0 - 53 U/L 19 43 21  Alk Phosphatase 39 - 117 U/L 78 90 80  Total Bilirubin 0.2 - 1.2 mg/dL 0.9 0.8 0.4  Bilirubin, Direct 0.0 - 0.3 mg/dL - - -    Lab Results  Component Value Date/Time   TSH 2.09 08/15/2019 09:35 AM   TSH 2.120 05/30/2018 03:59 PM    CBC Latest Ref Rng & Units 03/02/2021 05/30/2018 03/31/2017  WBC 4.0 - 10.5 K/uL 7.4 7.8 9.5  Hemoglobin 13.0 - 17.0 g/dL 15.2 16.1 16.2  Hematocrit 39.0 - 52.0 % 43.8 47.8 46.7  Platelets 150.0 - 400.0 K/uL 159.0 171 173    Lab Results  Component Value Date/Time   VD25OH 34 11/19/2010 07:35 PM    Clinical ASCVD: Yes  The ASCVD Risk score (Arnett DK, et al., 2019) failed to calculate for the following reasons:   The valid total cholesterol range is 130 to 320 mg/dL    Depression screen [facility] 2/9 03/09/2021 08/15/2019 07/24/2018  Decreased Interest 0 0 0  Down, Depressed, Hopeless 0 0 0  PHQ - 2 Score 0 0 0  Altered sleeping - 0 0  Tired, decreased energy - 0 0  Change in appetite - 0 0  Feeling bad or failure about yourself  - 0 0  Trouble concentrating - 0 0  Moving slowly or fidgety/restless - 0 0  Suicidal thoughts - 0 0  PHQ-9 Score - 0 0  Difficult doing work/chores - Not difficult at all Not difficult at all  Some recent data might be hidden     Social History   Tobacco Use  Smoking Status Never  Smokeless Tobacco Never   BP Readings from Last 3 Encounters:  08/06/21 132/84  07/31/21 114/86  03/09/21 122/74   Pulse Readings from Last 3 Encounters:  08/06/21 69  07/31/21 68  03/09/21 78   Wt Readings from Last 3 Encounters:  08/06/21 214 lb 4  oz (97.2 kg)  07/31/21 112 lb (50.8 kg)  03/09/21 207 lb 7 oz (94.1 kg)   BMI Readings from Last 3 Encounters:  08/06/21 29.88 kg/m  07/31/21 15.62 kg/m  03/09/21 28.93 kg/m    Assessment/Interventions: Review of patient past medical history, allergies, medications, health status, including review of consultants reports, laboratory and other test data, was performed as part of comprehensive evaluation and provision of chronic care management services.   SDOH:  (Social Determinants of Health) assessments and interventions performed: Yes  SDOH Screenings   Alcohol Screen: Not on file  Depression (PHQ2-9): Low Risk    PHQ-2 Score: 0  Financial Resource Strain: Not on file  Food Insecurity: Not on file  Housing: Not on file  Physical Activity: Not on file  Social Connections: Not on file  Stress:  Not on file  Tobacco Use: Low Risk    Smoking Tobacco Use: Never   Smokeless Tobacco Use: Never   Passive Exposure: Not on file  Transportation Needs: Not on file    CCM Care Plan  Allergies  Allergen Reactions   Ace Inhibitors Cough    Hacking cough    Medications Reviewed Today     Reviewed by Charlton Haws, [facility] (Pharmacist) on 09/07/21 at [place] Status: <None>   Medication Order Taking? Sig Documenting Provider Last Dose Status Informant  0.9 %  sodium chloride infusion [id number]   [name], MD  Consider Medication Status and Discontinue (Completed Course)   albuterol (VENTOLIN HFA) 108 (90 Base) MCG/ACT inhaler [id number] Yes Inhale 2 puffs into the lungs every 6 (six) hours as needed for wheezing or shortness of breath. Ria Bush, MD Taking Active   amLODipine [facility]) 2.5 MG tablet [id number] Yes TAKE 1 TABLET EVERY DAY [name], Peter M, MD Taking Active   aspirin 81 MG tablet [id number] Yes Take 81 mg by mouth daily. [provider] Taking Active Self  atorvastatin (LIPITOR) 10 MG tablet [id number] Yes TAKE 1 TABLET EVERY DAY AT 6PM  [name], Peter M, MD Taking Active   cholecalciferol (VITAMIN D) 1000 UNITS tablet [id number] Yes Take 1,000 Units by mouth daily. [provider] Taking Active Self  dorzolamide-timolol (COSOPT) 22.3-6.8 MG/ML ophthalmic solution [id number] Yes 1 drop 2 (two) times daily. [provider] Taking Active   metoprolol tartrate (LOPRESSOR) 50 MG tablet [id number] Yes Take 1 tablet (50 mg total) by mouth 2 (two) times daily. [name], Peter M, MD Taking Active   vitamin C (ASCORBIC ACID) 500 MG tablet [id number] Yes Take 500 mg by mouth daily.  [provider] Taking Active Self            Patient Active Problem List   Diagnosis Date Noted   Insomnia 03/09/2021   Leg cramps 03/09/2021   Pre-operative clearance 07/03/2020   Traumatic rotator cuff tear, left, initial encounter 06/27/2020   Traumatic rotator cuff tear, right, sequela 12/19/2018   History of colonic polyps 02/18/2017   Thrombosed external hemorrhoid 02/18/2017   CAD (coronary artery disease) 10/17/2016   Thoracic aortic atherosclerosis ([place]) 10/17/2016   Acute cough 08/12/2015   Health maintenance examination 08/12/2015   Advanced care planning/counseling discussion 08/05/2014   Medicare annual wellness visit, subsequent 08/05/2014   Prediabetes 08/05/2014   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   S/P AVR (aortic valve replacement) and aortoplasty 07/23/2014   Thoracic ascending aortic aneurysm 07/02/2014   Upper respiratory tract infection 06/04/2014   Sarcoidosis 04/26/2014   Asthmatic bronchitis 04/24/2014   Pulmonary nodule, left    Gross hematuria 11/06/2013   Essential hypertension 08/29/2013   Aortic insufficiency    Aortic valve stenosis, severe    DJD (degenerative joint disease) 02/27/2013   Wedge compression fracture of thoracic vertebra ([place]) 02/27/2013   TESTICULAR HYPOFUNCTION 09/08/2010   Recurrent nephrolithiasis 08/23/2009   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   Glaucoma 08/22/2008    HYPERCHOLESTEROLEMIA 12/18/2007   Obstructive sleep apnea 12/18/2007   ALLERGIC RHINITIS 12/18/2007    Immunization History  Administered Date(s) Administered   Fluad Quad(high Dose 65+) 05/29/2019, 07/02/2020, 07/07/2021   Influenza Split 09/07/2011, 07/04/2012   Influenza Whole 06/27/2010   Influenza,inj,Quad PF,6+ Mos 07/03/2013, 06/18/2014, 07/10/2015, 07/28/2016, 07/01/2017, 06/22/2018   Influenza-Unspecified 07/03/2013, 06/18/2014, 07/10/2015, 07/28/2016, 07/01/2017, 06/22/2018   PFIZER Comirnaty(Gray Top)Covid-19 Tri-Sucrose Vaccine 02/10/2021   PFIZER(Purple Top)SARS-COV-2 Vaccination 11/09/2019, 12/05/2019, 08/01/2020  Pension scheme manager 25yr & up 07/07/2021   Pneumococcal Conjugate-13 08/05/2014   Pneumococcal Polysaccharide-23 06/27/2010, 12/02/2011   Tdap 12/02/2011   Zoster, Live 09/12/2014    Conditions to be addressed/monitored:  Hypertension, Hyperlipidemia, and Coronary Artery Disease  Care Plan : [place] Updates made by FCharlton Haws RPH since 09/07/2021 12:00 AM     Problem: Hypertension, Hyperlipidemia, and Coronary Artery Disease   Priority: High     Long-Range Goal: Disase mgmt   Start Date: 09/07/2021  Expected End Date: 09/07/2022  This Visit's Progress: On track  Priority: High  Note:   Current Barriers:  Unable to independently monitor therapeutic efficacy  Pharmacist Clinical Goal(s):  Patient will achieve adherence to monitoring guidelines and medication adherence to achieve therapeutic efficacy through collaboration with PharmD and provider.   Interventions: 1:1 collaboration with GRia Bush MD regarding development and update of comprehensive plan of care as evidenced by provider attestation and co-signature Inter-disciplinary care team collaboration (see longitudinal plan of care) Comprehensive medication review performed; medication list updated in electronic medical  record  Hypertension (BP goal <140/90) -Controlled - pt uses smart watch to monitor BP periodically; he denies dizziness/lightheadedness -Hx OSA; s/p AVR 2015 -Current treatment: Amlodipine 2.5 mg daily Metoprolol tartrate 50 mg BID -Medications previously tried: lisinopril  -Current home readings: 120/80s -Educated on BP goals and benefits of medications for prevention of heart attack, stroke and kidney damage; -Counseled to monitor BP at home periodically -Recommended to continue current medication  Hyperlipidemia: (LDL goal < 70) -Controlled - LDL is at goal; pt endorses compliance with atorvastatin; he reports he has enough pills on hand to last until next refill (cardiology f/u appt next week) -Hx aortic atherosclerosis by CT -Current treatment: Atorvastatin 10 mg daily (LF 05/13/21) Aspirin 81 mg daily -Educated on Cholesterol goals; Benefits of statin for ASCVD risk reduction; -Recommended to continue current medication  Atrial Fibrillation (Goal: prevent stroke and major bleeding) -Controlled - lone episode Afib 2015. Monitoring for recurrence.  Asthmatic bronchitis (Goal: control symptoms and prevent exacerbations) -Controlled - pt denies frequent symptoms, he uses albuterol very rarely when exercising -Hx CAP Nov 2022 tx with Zpak then Augmentin -Current treatment  Albuterol HFA prn - rare use (once this year) Fluticasone nasal spray - not using -Pulmonary function testing: PFTs 03/2014 - moderate central airway obstructive disease - FVC 79%, FEV1 pre 73% and post 85%, ratio 0.68 -Exacerbations requiring treatment in last 6 months: 1 -Counseled on Proper inhaler technique; -Recommended to continue current medication  Insomnia (Goal: manage symptoms) -Controlled - pt reports he goes to bed 11:30pm, wakes up 4-6am; he reports he feels rested -Current treatment  Melatonin 5 mg  -Medications previously tried: trazodone  -Recommended to continue current  medication  Health Maintenance -Vaccine gaps: Shingrix - pt is planning to get vaccine after Jan 1st when insurance will cover it -Hx recurrent nephrolithiasis, remote -Current therapy:  Vitamin D 1000 IU Vitamin C 500 mg Dorzolamide-timolol eye drops -Patient is satisfied with current therapy and denies issues -Recommended to continue current medication  Patient Goals/Self-Care Activities Patient will:  - take medications as prescribed as evidenced by patient report and record review focus on medication adherence by routine check blood pressure periodically      Medication Assistance: None required.  Patient affirms current coverage meets needs.  Compliance/Adherence/Medication fill history: Care Gaps: None  Star-Rating Drugs: Atorvastatin - LF 05/13/21 x 90 ds; PDC 82%  Patient's preferred pharmacy is:  [id number]Phone: [phone]: [phone] [facility] [address]Phone: [phone]: [phone] [facility]Phone: [phone]: [phone] Uses pill box? Yes Pt endorses 100% compliance  We discussed: Current pharmacy is preferred with insurance plan and patient is satisfied with pharmacy services Patient decided to: Continue current medication management strategy  Care Plan and Follow Up Patient Decision:  Patient agrees to Care Plan and Follow-up.  Plan: Telephone follow up appointment with care management team member scheduled for:  1 year  LCharlene Brooke PharmD, BMethodist Fremont HealthClinical Pharmacist LAlpinePrimary Care at [phone]

## 2021-09-07 NOTE — Patient Instructions (Signed)
Visit Information  Phone number for Pharmacist: [phone]  Thank you for meeting with me to discuss your medications! I look forward to working with you to achieve your health care goals. Below is a summary of what we talked about during the visit:   Goals Addressed             This Visit's Progress    Manage My Medicine       Timeframe:  Long-Range Goal Priority:  Medium Start Date:   09/07/21                          Expected End Date: 09/07/22                       Follow Up Date Dec 2023   - call for medicine refill 2 or 3 days before it runs out - call if I am sick and can't take my medicine - keep a list of all the medicines I take; vitamins and herbals too - use a pillbox to sort medicine    Why is this important?   These steps will help you keep on track with your medicines.   Notes:         Care Plan : [place]  Updates made by Charlton Haws, RPH since 09/07/2021 12:00 AM     Problem: Hypertension, Hyperlipidemia, and Coronary Artery Disease   Priority: High     Long-Range Goal: Disase mgmt   Start Date: 09/07/2021  Expected End Date: 09/07/2022  This Visit's Progress: On track  Priority: High  Note:   Current Barriers:  Unable to independently monitor therapeutic efficacy  Pharmacist Clinical Goal(s):  Patient will achieve adherence to monitoring guidelines and medication adherence to achieve therapeutic efficacy through collaboration with PharmD and provider.   Interventions: 1:1 collaboration with Jeremy Bush, MD regarding development and update of comprehensive plan of care as evidenced by provider attestation and co-signature Inter-disciplinary care team collaboration (see longitudinal plan of care) Comprehensive medication review performed; medication list updated in electronic medical record  Hypertension (BP goal <140/90) -Controlled - pt uses smart watch to monitor BP periodically; he denies  dizziness/lightheadedness -Hx OSA; s/p AVR 2015 -Current treatment: Amlodipine 2.5 mg daily Metoprolol tartrate 50 mg BID -Medications previously tried: lisinopril  -Current home readings: 120/80s -Educated on BP goals and benefits of medications for prevention of heart attack, stroke and kidney damage; -Counseled to monitor BP at home periodically -Recommended to continue current medication  Hyperlipidemia: (LDL goal < 70) -Controlled - LDL is at goal; pt endorses compliance with atorvastatin; he reports he has enough pills on hand to last until next refill (cardiology f/u appt next week) -Hx aortic atherosclerosis by CT -Current treatment: Atorvastatin 10 mg daily (LF 05/13/21) Aspirin 81 mg daily -Educated on Cholesterol goals; Benefits of statin for ASCVD risk reduction; -Recommended to continue current medication  Atrial Fibrillation (Goal: prevent stroke and major bleeding) -Controlled - lone episode Afib 2015. Monitoring for recurrence.  Asthmatic bronchitis (Goal: control symptoms and prevent exacerbations) -Controlled - pt denies frequent symptoms, he uses albuterol very rarely when exercising -Hx CAP Nov 2022 tx with Zpak then Augmentin -Current treatment  Albuterol HFA prn - rare use (once this year) Fluticasone nasal spray - not using -Pulmonary function testing: PFTs 03/2014 - moderate central airway obstructive disease - FVC 79%, FEV1 pre 73% and post 85%, ratio 0.68 -Exacerbations requiring treatment in last  6 months: 1 -Counseled on Proper inhaler technique; -Recommended to continue current medication  Insomnia (Goal: manage symptoms) -Controlled - pt reports he goes to bed 11:30pm, wakes up 4-6am; he reports he feels rested -Current treatment  Melatonin 5 mg  -Medications previously tried: trazodone  -Recommended to continue current medication  Health Maintenance -Vaccine gaps: Shingrix - pt is planning to get vaccine after Jan 1st when insurance will cover  it -Hx recurrent nephrolithiasis, remote -Current therapy:  Vitamin D 1000 IU Vitamin C 500 mg Dorzolamide-timolol eye drops -Patient is satisfied with current therapy and denies issues -Recommended to continue current medication  Patient Goals/Self-Care Activities Patient will:  - take medications as prescribed as evidenced by patient report and record review focus on medication adherence by routine check blood pressure periodically      Jeremy Meadows was given information about Chronic Care Management services today including:  CCM service includes personalized support from designated clinical staff supervised by his physician, including individualized plan of care and coordination with other care providers 24/7 contact phone numbers for assistance for urgent and routine care needs. Standard insurance, coinsurance, copays and deductibles apply for chronic care management only during months in which we provide at least 20 minutes of these services. Most insurances cover these services at 100%, however patients may be responsible for any copay, coinsurance and/or deductible if applicable. This service may help you avoid the need for more expensive face-to-face services. Only one practitioner may furnish and bill the service in a calendar month. The patient may stop CCM services at any time (effective at the end of the month) by phone call to the office staff.  Patient agreed to services and verbal consent obtained.   Patient verbalizes understanding of instructions provided today and agrees to view in [place].  Telephone follow up appointment with pharmacy team member scheduled for: 1 year  Charlene Brooke, PharmD, [facility] Clinical Pharmacist [place] [facility] [phone]

## 2021-09-10 NOTE — Progress Notes (Signed)
I will  Jeremy Meadows Date of Birth: 31-May-1946 Medical Record #[id number]  History of Present Illness: Jeremy Meadows is seen for followup of aortic stenosis and  thoracic aortic aneurysm. He is s/p AVR and aortic aneurysm grafting by Dr. [name] on 07/02/14 with a #23 Magna Ease pericardial valve and 32 mm Hema shield graft. His post op course was complicated by atrial fibrillation that converted to NSR on amiodarone. Amiodarone was later discontinued and he has had no further arrhythmia.   In Nov 2016 he had an incisional hernia repaired by Dr [name], he tolerated this well. CT of the aorta in December 2017 was stable. Repeat Echo 05/31/19 showed normal LV function and normal AV prosthetic function.  He underwent left shoulder surgery in November 2021. He had no complications. Still active exercising and playing softball. No chest pain or SOB. He did have bronchial PNA last month. Recovered.    Current Outpatient Medications on File Prior to Visit  Medication Sig Dispense Refill   albuterol (VENTOLIN HFA) 108 (90 Base) MCG/ACT inhaler Inhale 2 puffs into the lungs every 6 (six) hours as needed for wheezing or shortness of breath. 54 g 1   aspirin 81 MG tablet Take 81 mg by mouth daily.     cholecalciferol (VITAMIN D) 1000 UNITS tablet Take 1,000 Units by mouth daily.     dorzolamide-timolol (COSOPT) 22.3-6.8 MG/ML ophthalmic solution 1 drop 2 (two) times daily.     metoprolol tartrate (LOPRESSOR) 50 MG tablet Take 1 tablet (50 mg total) by mouth 2 (two) times daily. 180 tablet 2   vitamin C (ASCORBIC ACID) 500 MG tablet Take 500 mg by mouth daily.      Current Facility-Administered Medications on File Prior to Visit  Medication Dose Route Frequency Provider Last Rate Last Admin   0.9 %  sodium chloride infusion  500 mL Intravenous Continuous [name], MD        Allergies  Allergen Reactions   Ace Inhibitors Cough    Hacking cough    Past Medical History:  Diagnosis Date    Allergic rhinitis, cause unspecified    Aortic insufficiency    s/p AVR 2015   Aortic root enlargement (HCC)    thoracic aorta   Aortic valve stenosis 2015   s/p AVR 2015 [name]) - completed cardiac rehab 08/2014   Arthritis    Asthma    Atrial fibrillation ([place]) 07/23/2014   Benign neoplasm of colon    Gallstones    by CT   History of kidney stones    History of nephrolithiasis    HLD (hyperlipidemia)    Hypertension    Need for prophylactic antibiotic    Obstructive sleep apnea (adult) (pediatric)    pt. doesn't use his machine at home.   Other testicular hypofunction    Pulmonary nodule, left 2015   2cm, ?sarcoid by biopsy   Unspecified glaucoma(365.9)     Past Surgical History:  Procedure Laterality Date   AORTIC VALVE REPLACEMENT N/A 07/02/2014   Procedure: AORTIC VALVE REPLACEMENT (AVR) with 23 Aortic Magna Ease;  Surgeon: Grace Isaac, MD   ASCENDING AORTIC ROOT REPLACEMENT N/A 07/02/2014   Procedure: supra coronary  ASCENDING AORTIC REPLACEMENT to the Inominate Artery with 88mm Hemashield Platinum with  circulatory arrest;  Surgeon: Grace Isaac, MD   CARDIAC CATHETERIZATION     CARDIAC VALVE REPLACEMENT     CATARACT EXTRACTION Bilateral    COLONOSCOPY  2013   WNL, rpt 5 yrs (  [name])   COLONOSCOPY  2018   1 TA, diverticulosis, rpt 5 yrs [name])   EYE SURGERY     INTRAOPERATIVE TRANSESOPHAGEAL ECHOCARDIOGRAM N/A 07/02/2014   JOINT REPLACEMENT Right 14   partial replacement   KNEE SURGERY Right 05/2012   arthroscopic   KNEE SURGERY Right 12/2013   patella   LEFT AND RIGHT HEART CATHETERIZATION WITH CORONARY ANGIOGRAM N/A 05/22/2014   no significant obstructive CAD ([name])   LITHOTRIPSY     LUNG BIOPSY Left 2015   by IR - benign   MEDIAL PARTIAL KNEE REPLACEMENT Right 03/2013   RHINOPLASTY  1980s   VENTRAL HERNIA REPAIR  08/26/2015   VENTRAL HERNIA REPAIR N/A 08/26/2015   Procedure: VENTRAL HERNIA REPAIR WITH MESH ADULT;  Surgeon: Grace Isaac, MD;  Location: [facility] OR;  Service: Vascular;  Laterality: N/A;    Social History   Tobacco Use  Smoking Status Never  Smokeless Tobacco Never    Social History   Substance and Sexual Activity  Alcohol Use No   Alcohol/week: 0.0 standard drinks    Family History  Problem Relation Age of Onset   Cancer Mother        Jaw   CAD Father 78       MI   Heart disease Brother    Heart disease Brother    Heart disease Brother    Colon cancer Neg Hx    Colon polyps Neg Hx    Esophageal cancer Neg Hx    Rectal cancer Neg Hx    Stomach cancer Neg Hx     Review of Systems: As noted in history of present illness. All other systems were reviewed and are negative.  Physical Exam: BP (!) 130/100 (BP Location: Left Arm)    Pulse 68    Ht 5' 11.5" (1.816 m)    Wt 112 lb 12.8 oz (51.2 kg)    SpO2 97%    BMI 15.51 kg/m  GENERAL:  Well appearing WM in NAD HEENT:  PERRL, EOMI, sclera are clear. Oropharynx is clear. NECK:  No jugular venous distention, carotid upstroke brisk and symmetric, no bruits, no thyromegaly or adenopathy LUNGS:  Clear to auscultation bilaterally CHEST:  Unremarkable HEART:  RRR,  PMI not displaced or sustained,S1 and S2 within normal limits, no S3, no S4: no clicks, no rubs, soft 1/6 systolic murmur RUSB ABD:  Soft, nontender. BS +, no masses or bruits. No hepatomegaly, no splenomegaly EXT:  2 + pulses throughout, no edema, no cyanosis no clubbing SKIN:  Warm and dry.  No rashes NEURO:  Alert and oriented x 3. Cranial nerves II through XII intact. PSYCH:  Cognitively intact    LABORATORY DATA: Lab Results  Component Value Date   WBC 7.4 03/02/2021   HGB 15.2 03/02/2021   HCT 43.8 03/02/2021   PLT 159.0 03/02/2021   GLUCOSE 103 (H) 03/02/2021   CHOL 113 03/02/2021   TRIG 159.0 (H) 03/02/2021   HDL 31.30 (L) 03/02/2021   LDLDIRECT 67.0 08/15/2019   LDLCALC 49 03/02/2021   ALT 19 03/02/2021   AST 25 03/02/2021   NA 140 03/02/2021   K 4.5  03/02/2021   CL 105 03/02/2021   CREATININE 1.10 03/02/2021   BUN 24 (H) 03/02/2021   CO2 27 03/02/2021   TSH 2.09 08/15/2019   PSA 0.88 03/02/2021   INR 0.99 08/14/2015   HGBA1C 5.8 03/02/2021   MICROALBUR 1.3 03/02/2021    Ecg today shows NSR with rate 68. RBBB  is new from October 2021. I  personally reviewed and interpreted this study.  Echo: 08/02/14: Study Conclusions  - Left ventricle: The cavity size was normal. Wall thickness was   normal. Systolic function was normal. The estimated ejection   fraction was in the range of 60% to 65%. Features are consistent   with a pseudonormal left ventricular filling pattern, with   concomitant abnormal relaxation and increased filling pressure   (grade 2 diastolic dysfunction). - Aortic valve: AV valve prosthesis opens well Peak and mean   gradients through the valve are 17 and 10 mm Hg respectively. - Mitral valve: There was mild regurgitation. - Left atrium: The atrium was moderately dilated.   CT CHEST WITHOUT CONTRAST   TECHNIQUE: Multidetector CT imaging of the chest was performed following the standard protocol without IV contrast.   COMPARISON:  02/20/2015   FINDINGS: Cardiovascular: No acute findings. Previous aortic valve replacement. Stable appearance of the ascending aortic graft. Distal ascending aorta along the distal aspect of the graft measures 4.5 cm in maximum diameter, which remains stable. No evidence of mediastinal hematoma.   Aortic and coronary artery atherosclerotic calcification noted. Normal heart size. No evidence pericardial effusion.   Mediastinum/Nodes: No masses or pathologically enlarged lymph nodes identified on this unenhanced exam.   Lungs/Pleura: No pulmonary infiltrate or mass identified. Mild scarring noted posterior left lower lobe. No effusion present.   Upper Abdomen:  Small calcified gallstones incidentally noted.   Musculoskeletal:  No suspicious bone lesions.    IMPRESSION: Previous aortic valve replacement. Stable appearance of ascending aortic graft. Stable mild dilatation of distal ascending aorta along the distal aspect of the graft, measuring 4.5 cm in maximum diameter.   No acute findings.   Incidentally noted aortic and coronary atherosclerosis and cholelithiasis.     Electronically Signed   By: Earle Gell M.D.   On: 09/15/2016 14:38  Echo 05/31/19: IMPRESSIONS      1. The left ventricle has normal systolic function, with an ejection fraction of 55-60%. The cavity size was normal. Left ventricular diastolic parameters were normal. Indeterminate filling pressures No evidence of left ventricular regional wall motion  abnormalities.  2. The right ventricle has normal systolic function. The cavity was normal. There is no increase in right ventricular wall thickness.  3. The mitral valve is abnormal. Mild thickening of the mitral valve leaflet.  4. The tricuspid valve is grossly normal.  5. A 65mm Magna bioprosthesis valve is present in the aortic position. Normal aortic valve prosthesis.  6. Prosthetic aortic graft in the ascending aorta position.  7. The aorta is normal unless otherwise noted.  8. The inferior vena cava was normal in size with <50% respiratory variability.  9. When compared to the prior study: 08/02/14 EF 60-65%. AV 43mmHg max, 63mmHg mean.  Assessment / Plan: 1. Aortic stenosis/bicuspid AV -  S/p pericardial tissue valve replacement in October 2015. Doing well. Echo post op showed good valve function. Repeat Echo September 2020 showed normal prosthetic function. SBE prophylaxis. Exam today is good.  2. Thoracic aortic aneurysm. S/p grafting. CT showed good repair with stable follow up in December 2017.   3. Hypertension-reports it is well controlled on current medication. Rx refilled  4.PVCs asymptomatic. Noted intermittently in past. Normal LV function. Continue metoprolol.  5. RBBB new since 2021. No symptoms.  Benign.

## 2021-09-14 ENCOUNTER — Ambulatory Visit: Payer: Medicare HMO | Admitting: Cardiology

## 2021-09-14 ENCOUNTER — Encounter: Payer: Self-pay | Admitting: Cardiology

## 2021-09-14 ENCOUNTER — Other Ambulatory Visit: Payer: Self-pay

## 2021-09-14 VITALS — BP 130/100 | HR 68 | Ht 71.5 in | Wt 112.8 lb

## 2021-09-14 DIAGNOSIS — I7121 Aneurysm of the ascending aorta, without rupture: Secondary | ICD-10-CM

## 2021-09-14 DIAGNOSIS — I10 Essential (primary) hypertension: Secondary | ICD-10-CM | POA: Diagnosis not present

## 2021-09-14 DIAGNOSIS — Z952 Presence of prosthetic heart valve: Secondary | ICD-10-CM

## 2021-09-14 MED ORDER — ATORVASTATIN CALCIUM 10 MG PO TABS: ORAL_TABLET | ORAL | 3 refills | Status: DC

## 2021-09-14 MED ORDER — AMLODIPINE BESYLATE 2.5 MG PO TABS: 2.5000 mg | ORAL_TABLET | Freq: Every day | ORAL | 3 refills | Status: DC

## 2021-10-05 ENCOUNTER — Other Ambulatory Visit: Payer: Self-pay

## 2021-10-05 MED ORDER — ZOSTER VAC RECOMB ADJUVANTED 50 MCG/0.5ML IM SUSR
INTRAMUSCULAR | 1 refills | Status: DC
  Filled 2021-10-05: qty 1, 1d supply, fill #0
  Filled 2021-12-18 – 2022-01-15 (×10): qty 1, 1d supply, fill #1

## 2021-12-03 DIAGNOSIS — H401131 Primary open-angle glaucoma, bilateral, mild stage: Secondary | ICD-10-CM | POA: Diagnosis not present

## 2021-12-14 ENCOUNTER — Other Ambulatory Visit: Payer: Self-pay

## 2021-12-18 ENCOUNTER — Other Ambulatory Visit: Payer: Self-pay

## 2021-12-22 ENCOUNTER — Other Ambulatory Visit: Payer: Self-pay

## 2021-12-25 ENCOUNTER — Other Ambulatory Visit: Payer: Self-pay

## 2021-12-29 ENCOUNTER — Other Ambulatory Visit: Payer: Self-pay

## 2022-01-01 ENCOUNTER — Other Ambulatory Visit: Payer: Self-pay

## 2022-01-05 ENCOUNTER — Other Ambulatory Visit: Payer: Self-pay

## 2022-01-06 ENCOUNTER — Other Ambulatory Visit: Payer: Self-pay | Admitting: Cardiology

## 2022-01-06 DIAGNOSIS — I10 Essential (primary) hypertension: Secondary | ICD-10-CM

## 2022-01-08 ENCOUNTER — Other Ambulatory Visit: Payer: Self-pay

## 2022-01-08 ENCOUNTER — Telehealth: Payer: Self-pay | Admitting: *Deleted

## 2022-01-08 NOTE — Telephone Encounter (Signed)
Jeremy Meadows with [employer] called and had the patient on the line. Jeremy Meadows stated that the patient's plan requires that the PCP do a PA on the shingrix vaccine. Explained to [facility] that we have never been asked to do this since medicare plans will not cover this vaccine at the PCP's office and therefore patient's go to a pharmacy to get this vaccine. ?Jeremy Meadows stated if there are any questions we can call back [phone] and  press opt provider. After talking with Amy I called back to see about getting something faxed to [place] showing that we need to get PA's on shingrix vaccines. I called and was on the phone for over 40 minutes tying to get information as to why a PA needs to be done. I finally spoke to [place] and was advised that patient has received the life time recommended dose. Jeremy Meadows that we show that patient has only received one shingrix vaccine 1/23 and to complete the series he would need one more. ?Jeremy Meadows transferred me to pharmacy review and I spoke to Jeremy Meadows. Jeremy Meadows was trying to figure out why this PA was sent out. Jeremy Meadows stated that she will do some research and call me back. ?Jeremy Meadows called back and stated that they show that patient had a claim for a shingrix vaccine 07/07/21 at [facility]. ?Called and relayed this information to the patient. Patient stated that he did not received the shinrix vaccine on 07/07/21, but did received the flu vaccine. Patient stated that he will contact the pharmacy now to get this cleared up.  ?

## 2022-01-08 NOTE — Telephone Encounter (Signed)
Received a faxed form from [facility] requesting a PA for a shingrix vaccine. ?Called and spoke to Molino at [facility] and was advised when they tried to put through to the insurance company a shingrix vaccine the notice came back showing that he would need a PA. Advised Benita that our office would not do a PA for a vaccine that is given at the pharmacy. ?Called and spoke to patient and was advised that he and his wife went the same time to get their second vaccine and they gave his wife one and would not give him his. Patient stated that they have the same insurance. Advised patient that he needs to contact his insurance company and work this out with them. Patient was advised if he has medicare he would need to get it at a pharmacy. Advised patient if he finds out anything different to let us know. Patient stated that he had the first shingrix vaccine at [facility] several months ago and it is due.  ?

## 2022-01-15 ENCOUNTER — Other Ambulatory Visit: Payer: Self-pay

## 2022-01-19 ENCOUNTER — Other Ambulatory Visit: Payer: Self-pay

## 2022-03-03 ENCOUNTER — Telehealth: Payer: Self-pay

## 2022-03-03 NOTE — Progress Notes (Signed)
    Chronic Care Management Pharmacy Assistant   Name: Jeremy Meadows  MRN: [id number] DOB: [birth year]  Reason for Encounter: CCM (General Adherence)   Recent office visits:  None since last CCM contact  Recent consult visits:  12/03/21 Macarthur Critchley (Optometry) Glaucoma 09/14/21 Peter [name], MD S/P AVR Ordered: EKG FU 1 year  Hospital visits:  None in previous 6 months  Medications: Outpatient Encounter Medications as of 03/03/2022  Medication Sig   albuterol (VENTOLIN HFA) 108 (90 Base) MCG/ACT inhaler Inhale 2 puffs into the lungs every 6 (six) hours as needed for wheezing or shortness of breath.   amLODipine (NORVASC) 2.5 MG tablet Take 1 tablet (2.5 mg total) by mouth daily.   aspirin 81 MG tablet Take 81 mg by mouth daily.   atorvastatin (LIPITOR) 10 MG tablet TAKE 1 TABLET EVERY DAY AT 6PM   cholecalciferol (VITAMIN D) 1000 UNITS tablet Take 1,000 Units by mouth daily.   dorzolamide-timolol (COSOPT) 22.3-6.8 MG/ML ophthalmic solution 1 drop 2 (two) times daily.   metoprolol tartrate (LOPRESSOR) 50 MG tablet TAKE 1 TABLET TWICE DAILY   vitamin C (ASCORBIC ACID) 500 MG tablet Take 500 mg by mouth daily.    Zoster Vaccine Adjuvanted [facility]) injection Inject into the muscle.   Facility-Administered Encounter Medications as of 03/03/2022  Medication   0.9 %  sodium chloride infusion   Contacted [name] on 03/03/2022 for general disease state and medication adherence call.   Patient is not more than 5 days past due for refill on the following medications per chart history:  [facility] Medications: Medication Name/mg Last Fill Days Supply Atorvastatin 10 mg  02/19/22 90  What concerns do you have about your medications? No concerns at this time.   The patient denies side effects with their medications.   How often do you forget or accidentally miss a dose? Rarely  Do you use a pillbox? Yes  Are you having any problems getting your medications from your pharmacy?  No  Has the cost of your medications been a concern? No  Since last visit with CPP, no interventions have been made.   The patient has not had an ED visit since last contact.   The patient reports the following problems with their health. No problems, but patient is scheduled to have his right shoulder replaced in July.   Patient denies concerns or questions for Charlene Brooke, PharmD at this time.   Care Gaps: Annual wellness visit in last year? Yes 03/09/2021  Most Recent BP reading: 130/100 on 09/14/2021  Upcoming appointments: CCM appointment on 09/06/2022  Charlene Brooke, CPP notified  Marijean Niemann, [place] Assistant [phone]

## 2022-03-10 ENCOUNTER — Telehealth: Payer: Self-pay | Admitting: Family Medicine

## 2022-03-10 NOTE — Telephone Encounter (Signed)
Left message for patient to call back and schedule Medicare Annual Wellness Visit (AWV) either virtually or phone   Last AWV ;03/09/21   I left my direct # [phone]

## 2022-03-16 ENCOUNTER — Ambulatory Visit (INDEPENDENT_AMBULATORY_CARE_PROVIDER_SITE_OTHER): Payer: Medicare HMO

## 2022-03-16 VITALS — Ht 71.0 in | Wt 220.0 lb

## 2022-03-16 DIAGNOSIS — Z0000 Encounter for general adult medical examination without abnormal findings: Secondary | ICD-10-CM

## 2022-03-16 NOTE — Progress Notes (Signed)
I connected with Jeremy Meadows today by telephone and verified that I am speaking with the correct person using two identifiers. Location patient: home Location provider: work Persons participating in the virtual visit: Jeremy Meadows, Glenna Durand LPN.   I discussed the limitations, risks, security and privacy concerns of performing an evaluation and management service by telephone and the availability of in person appointments. I also discussed with the patient that there may be a patient responsible charge related to this service. The patient expressed understanding and verbally consented to this telephonic visit.    Interactive audio and video telecommunications were attempted between this provider and patient, however failed, due to patient having technical difficulties OR patient did not have access to video capability.  We continued and completed visit with audio only.     Vital signs may be patient reported or missing.  Subjective:   Jeremy Meadows is a 76 y.o. male who presents for Medicare Annual/Subsequent preventive examination.  Review of Systems     Cardiac Risk Factors include: advanced age (>22mn, >>92women);hypertension;male gender;obesity (BMI >30kg/m2)     Objective:    Today's Vitals   03/16/22 1026  Weight: 220 lb (99.8 kg)  Height: '5\' 11"'$  (1.803 m)   Body mass index is 30.68 kg/m.     03/16/2022   10:30 AM 08/15/2019   11:16 AM 09/15/2018    8:16 AM 07/24/2018   11:21 AM 03/08/2017    9:13 AM 08/12/2016   11:44 AM 08/26/2015    4:00 PM  Advanced Directives  Does Patient Have a Medical Advance Directive? Yes Yes Yes Yes Yes Yes Yes  Type of AParamedicof [place] will [place] will  [place]Living will Living will;Healthcare Power of ANew EraLiving will [place]Living will  Does patient want to make changes to medical  advance directive?      No - Patient declined No - Patient declined  Copy of [place] Chart? Yes - validated most recent copy scanned in chart (See row information) Yes - validated most recent copy scanned in chart (See row information)  No - copy requested  Yes Yes    Current Medications (verified) Outpatient Encounter Medications as of 03/16/2022  Medication Sig   albuterol (VENTOLIN HFA) 108 (90 Base) MCG/ACT inhaler Inhale 2 puffs into the lungs every 6 (six) hours as needed for wheezing or shortness of breath.   amLODipine (NORVASC) 2.5 MG tablet Take 1 tablet (2.5 mg total) by mouth daily.   aspirin 81 MG tablet Take 81 mg by mouth daily.   atorvastatin (LIPITOR) 10 MG tablet TAKE 1 TABLET EVERY DAY AT 6PM   cholecalciferol (VITAMIN D) 1000 UNITS tablet Take 1,000 Units by mouth daily.   dorzolamide-timolol (COSOPT) 22.3-6.8 MG/ML ophthalmic solution 1 drop 2 (two) times daily.   metoprolol tartrate (LOPRESSOR) 50 MG tablet TAKE 1 TABLET TWICE DAILY   vitamin C (ASCORBIC ACID) 500 MG tablet Take 500 mg by mouth daily.    Zoster Vaccine Adjuvanted ([facility] injection Inject into the muscle.   Facility-Administered Encounter Medications as of 03/16/2022  Medication   0.9 %  sodium chloride infusion    Allergies (verified) Ace inhibitors   History: Past Medical History:  Diagnosis Date   Allergic rhinitis, cause unspecified    Aortic insufficiency    s/p AVR 2015   Aortic root enlargement (HCC)    thoracic aorta   Aortic  valve stenosis 2015   s/p AVR 2015 [name]) - completed cardiac rehab 08/2014   Arthritis    Asthma    Atrial fibrillation ([place]) 07/23/2014   Benign neoplasm of colon    Gallstones    by CT   History of kidney stones    History of nephrolithiasis    HLD (hyperlipidemia)    Hypertension    Need for prophylactic antibiotic    Obstructive sleep apnea (adult) (pediatric)    pt. doesn't use his machine at home.   Other testicular  hypofunction    Pulmonary nodule, left 2015   2cm, ?sarcoid by biopsy   Unspecified glaucoma(365.9)    Past Surgical History:  Procedure Laterality Date   AORTIC VALVE REPLACEMENT N/A 07/02/2014   Procedure: AORTIC VALVE REPLACEMENT (AVR) with 23 Aortic Magna Ease;  Surgeon: Grace Isaac, MD   ASCENDING AORTIC ROOT REPLACEMENT N/A 07/02/2014   Procedure: supra coronary  ASCENDING AORTIC REPLACEMENT to the Inominate Artery with 28m Hemashield Platinum with  circulatory arrest;  Surgeon: EGrace Isaac MD   CARDIAC CATHETERIZATION     CARDIAC VALVE REPLACEMENT     CATARACT EXTRACTION Bilateral    COLONOSCOPY  2013   WNL, rpt 5 yrs ([name]   COLONOSCOPY  2018   1 TA, diverticulosis, rpt 5 yrs ([name]   EYE SURGERY     INTRAOPERATIVE TRANSESOPHAGEAL ECHOCARDIOGRAM N/A 07/02/2014   JOINT REPLACEMENT Right 14   partial replacement   KNEE SURGERY Right 05/2012   arthroscopic   KNEE SURGERY Right 12/2013   patella   LEFT AND RIGHT HEART CATHETERIZATION WITH CORONARY ANGIOGRAM N/A 05/22/2014   no significant obstructive CAD ([place]   LITHOTRIPSY     LUNG BIOPSY Left 2015   by IR - benign   MEDIAL PARTIAL KNEE REPLACEMENT Right 03/2013   RHINOPLASTY  1980s   VENTRAL HERNIA REPAIR  08/26/2015   VENTRAL HERNIA REPAIR N/A 08/26/2015   Procedure: VENTRAL HERNIA REPAIR WITH MESH ADULT;  Surgeon: EGrace Isaac MD;  [facility]  Service: Vascular;  Laterality: N/A;   Family History  Problem Relation Age of Onset   Cancer Mother        Jaw   CAD Father 427      MI   Heart disease Brother    Heart disease Brother    Heart disease Brother    Colon cancer Neg Hx    Colon polyps Neg Hx    Esophageal cancer Neg Hx    Rectal cancer Neg Hx    Stomach cancer Neg Hx    Social History   Socioeconomic History   Marital status: Married    Spouse name: Not on file   Number of children: 2   Years of education: Not on file   Highest education level: Not on file  Occupational  History   Occupation: retired  Tobacco Use   Smoking status: Never   Smokeless tobacco: Never  Vaping Use   Vaping Use: Never used  Substance and Sexual Activity   Alcohol use: No    Alcohol/week: 0.0 standard drinks of alcohol   Drug use: No   Sexual activity: Yes  Other Topics Concern   Not on file  Social History Narrative   Lives with wife JMaryellen Pilein army - no agent orange exposure   Occupation: retired, was in [employer]   Activity: softball, walking   Diet: good water, fruits/vegetables daily   Social Determinants of Health  Financial Resource Strain: Low Risk  (03/16/2022)   Overall Financial Resource Strain (CARDIA)    Difficulty of Paying Living Expenses: Not hard at all  Food Insecurity: No Food Insecurity (03/16/2022)   Hunger Vital Sign    Worried About Running Out of Food in the Last Year: Never true    Ran Out of Food in the Last Year: Never true  Transportation Needs: No Transportation Needs (03/16/2022)   PRAPARE - Hydrologist (Medical): No    Lack of Transportation (Non-Medical): No  Physical Activity: Sufficiently Active (03/16/2022)   Exercise Vital Sign    Days of Exercise per Week: 7 days    Minutes of Exercise per Session: 30 min  Stress: No Stress Concern Present (03/16/2022)   [place]    Feeling of Stress : Not at all  Social Connections: Not on file    Tobacco Counseling Counseling given: Not Answered   Clinical Intake:  Pre-visit preparation completed: No  Pain : No/denies pain     Nutritional Status: BMI > 30  Obese Nutritional Risks: None Diabetes: No  How often do you need to have someone help you when you read instructions, pamphlets, or other written materials from your doctor or pharmacy?: 1 - Never What is the last grade level you completed in school?: college  Diabetic? no  Interpreter Needed?: No  Information  entered by :: NAllen LPN   Activities of Daily Living    03/16/2022   10:31 AM 03/15/2022    2:16 PM  In your present state of health, do you have any difficulty performing the following activities:  Hearing? 0 0  Vision? 0 0  Difficulty concentrating or making decisions? 0 0  Walking or climbing stairs? 0 0  Dressing or bathing? 0 0  Doing errands, shopping? 0 0  Preparing Food and eating ? N N  Using the Toilet? N N  In the past six months, have you accidently leaked urine? N N  Do you have problems with loss of bowel control? N N  Managing your Medications? N N  Managing your Finances? N N  Housekeeping or managing your Housekeeping? N N    Patient Care Team: Ria Bush, MD as PCP - General (Family Medicine) [name], Peter M, MD as Consulting Physician (Cardiology) Grace Isaac, MD (Inactive) as Consulting Physician (Cardiothoracic Surgery) Charlton Haws, [facility] as Pharmacist (Pharmacist)  Indicate any recent Medical Services you may have received from other than [facility] providers in the past year (date may be approximate).     Assessment:   This is a routine wellness examination for Jeremy Meadows.  Hearing/Vision screen Vision Screening - Comments:: Regular eye exams, [facility]  Dietary issues and exercise activities discussed: Current Exercise Habits: Home exercise routine, Type of exercise: strength training/weights, Time (Minutes): 30, Frequency (Times/Week): 7, Weekly Exercise (Minutes/Week): 210   Goals Addressed             This Visit's Progress    Patient Stated       03/16/2022, wants to lose weight and wants to start playing softball again next year       Depression Screen    03/16/2022   10:31 AM 03/09/2021    2:07 PM 08/15/2019   11:17 AM 07/24/2018   11:20 AM 08/12/2016   11:44 AM 08/12/2015    8:36 AM 08/05/2014    8:24 AM  PHQ 2/9 Scores  PHQ -  2 Score 0 0 0 0 0 0 0  PHQ- 9 Score   0 0       Fall Risk    03/16/2022    10:30 AM 03/15/2022    2:16 PM 03/09/2021    2:06 PM 08/15/2019   11:16 AM 07/24/2018   11:20 AM  [place] in the past year? 0 0 1 0 No  Number falls in past yr: 0  0 0   Injury with Fall? 0 0 1 0   Comment   Left shoulder injury    Risk for fall due to : Medication side effect      Follow up Falls evaluation completed;Education provided;Falls prevention discussed   Falls evaluation completed;Falls prevention discussed     FALL RISK PREVENTION PERTAINING TO THE HOME:  Any stairs in or around the home? Yes  If so, are there any without handrails? Yes  Home free of loose throw rugs in walkways, pet beds, electrical cords, etc? Yes  Adequate lighting in your home to reduce risk of falls? Yes   ASSISTIVE DEVICES UTILIZED TO PREVENT FALLS:  Life alert? No  Use of a cane, walker or w/c? No  Grab bars in the bathroom? Yes  Shower chair or bench in shower? No  Elevated toilet seat or a handicapped toilet? Yes   TIMED UP AND GO:  Was the test performed? No .      Cognitive Function:    08/15/2019   11:18 AM 07/24/2018   11:21 AM 08/12/2016   12:15 PM  MMSE - Mini Mental State Exam  Orientation to time '5 5 5  '$ Orientation to Place '5 5 5  '$ Registration '3 3 3  '$ Attention/ Calculation 5 0 0  Recall '3 3 3  '$ Language- name 2 objects  0 0  Language- repeat '1 1 1  '$ Language- follow 3 step command  3 3  Language- read & follow direction  0 0  Write a sentence  0 0  Copy design  0 0  Total score  20 20        03/16/2022   10:31 AM  6CIT Screen  What Year? 0 points  What month? 0 points  What time? 0 points  Count back from 20 0 points  Months in reverse 0 points  Repeat phrase 0 points  Total Score 0 points    Immunizations Immunization History  Administered Date(s) Administered   Fluad Quad(high Dose 65+) 05/29/2019, 07/02/2020, 07/07/2021   Influenza Split 09/07/2011, 07/04/2012   Influenza Whole 06/27/2010   Influenza,inj,Quad PF,6+ Mos 07/03/2013,  06/18/2014, 07/10/2015, 07/28/2016, 07/01/2017, 06/22/2018   Influenza-Unspecified 07/03/2013, 06/18/2014, 07/10/2015, 07/28/2016, 07/01/2017, 06/22/2018   PFIZER Comirnaty(Gray Top)Covid-19 Tri-Sucrose Vaccine 02/10/2021   PFIZER(Purple Top)SARS-COV-2 Vaccination 11/09/2019, 12/05/2019, 08/01/2020   Pfizer Covid-19 Vaccine Bivalent Booster 82yr & up 07/07/2021   Pneumococcal Conjugate-13 08/05/2014   Pneumococcal Polysaccharide-23 06/27/2010, 12/02/2011   Tdap 12/02/2011   Zoster Recombinat (Shingrix) 10/05/2021, 01/19/2022   Zoster, Live 09/12/2014    TDAP status: Due, Education has been provided regarding the importance of this vaccine. Advised may receive this vaccine at local pharmacy or Health Dept. Aware to provide a copy of the vaccination record if obtained from local pharmacy or Health Dept. Verbalized acceptance and understanding.  Flu Vaccine status: Up to date  Pneumococcal vaccine status: Up to date  Covid-19 vaccine status: Completed vaccines  Qualifies for Shingles Vaccine? Yes   Zostavax completed Yes   Shingrix Completed?: Yes  Screening Tests Health Maintenance  Topic Date Due   TETANUS/TDAP  12/01/2021   COLONOSCOPY (Pts 45-106yr Insurance coverage will need to be confirmed)  03/08/2022   INFLUENZA VACCINE  04/27/2022   Pneumonia Vaccine 76 Years old  Completed   COVID-19 Vaccine  Completed   Hepatitis C Screening  Completed   Zoster Vaccines- Shingrix  Completed   HPV VACCINES  Aged Out    Health Maintenance  Health Maintenance Due  Topic Date Due   TETANUS/TDAP  12/01/2021   COLONOSCOPY (Pts 45-444yrInsurance coverage will need to be confirmed)  03/08/2022    Colorectal cancer screening: Type of screening: Colonoscopy. Completed 03/08/2017. Repeat every 5 years  Lung Cancer Screening: (Low Dose CT Chest recommended if Age 76-80ears, 30 pack-year currently smoking OR have quit w/in 15years.) does not qualify.   Lung Cancer Screening Referral:  no  Additional Screening:  Hepatitis C Screening: does qualify; Completed 08/12/2016  Vision Screening: Recommended annual ophthalmology exams for early detection of glaucoma and other disorders of the eye. Is the patient up to date with their annual eye exam?  Yes  Who is the provider or what is the name of the office in which the patient attends annual eye exams? [facility] If pt is not established with a provider, would they like to be referred to a provider to establish care? No .   Dental Screening: Recommended annual dental exams for proper oral hygiene  Community Resource Referral / Chronic Care Management: CRR required this visit?  No   CCM required this visit?  No      Plan:     I have personally reviewed and noted the following in the patient's chart:   Medical and social history Use of alcohol, tobacco or illicit drugs  Current medications and supplements including opioid prescriptions. Patient is not currently taking opioid prescriptions. Functional ability and status Nutritional status Physical activity Advanced directives List of other physicians Hospitalizations, surgeries, and ER visits in previous 12 months Vitals Screenings to include cognitive, depression, and falls Referrals and appointments  In addition, I have reviewed and discussed with patient certain preventive protocols, quality metrics, and best practice recommendations. A written personalized care plan for preventive services as well as general preventive health recommendations were provided to patient.     NiKellie SimmeringLPN   02/25/46/[id number] Nurse Notes: none  Due to this being a virtual visit, the after visit summary with patients personalized plan was offered to patient via mail or my-chart. Patient would like to access on my-chart

## 2022-03-16 NOTE — Patient Instructions (Signed)
Jeremy Meadows , Thank you for taking time to come for your Medicare Wellness Visit. I appreciate your ongoing commitment to your health goals. Please review the following plan we discussed and let me know if I can assist you in the future.   Screening recommendations/referrals: Colonoscopy: completed 03/08/2017, due now Recommended yearly ophthalmology/optometry visit for glaucoma screening and checkup Recommended yearly dental visit for hygiene and checkup  Vaccinations: Influenza vaccine: due 04/27/2022 Pneumococcal vaccine: completed 08/05/2014 Tdap vaccine: due Shingles vaccine: completed   Covid-19:  07/07/2021, 02/10/2021, 08/01/2020, 12/05/2019, 11/09/2019  Advanced directives: copy in chart  Conditions/risks identified: none  Next appointment: Follow up in one year for your annual wellness visit.   Preventive Care 32 Years and Older, Male Preventive care refers to lifestyle choices and visits with your health care provider that can promote health and wellness. What does preventive care include? A yearly physical exam. This is also called an annual well check. Dental exams once or twice a year. Routine eye exams. Ask your health care provider how often you should have your eyes checked. Personal lifestyle choices, including: Daily care of your teeth and gums. Regular physical activity. Eating a healthy diet. Avoiding tobacco and drug use. Limiting alcohol use. Practicing safe sex. Taking low doses of aspirin every day. Taking vitamin and mineral supplements as recommended by your health care provider. What happens during an annual well check? The services and screenings done by your health care provider during your annual well check will depend on your age, overall health, lifestyle risk factors, and family history of disease. Counseling  Your health care provider may ask you questions about your: Alcohol use. Tobacco use. Drug use. Emotional well-being. Home and  relationship well-being. Sexual activity. Eating habits. History of falls. Memory and ability to understand (cognition). Work and work Statistician. Screening  You may have the following tests or measurements: Height, weight, and BMI. Blood pressure. Lipid and cholesterol levels. These may be checked every 5 years, or more frequently if you are over 76 years old. Skin check. Lung cancer screening. You may have this screening every year starting at age 76 if you have a 30-pack-year history of smoking and currently smoke or have quit within the past 15 years. Fecal occult blood test (FOBT) of the stool. You may have this test every year starting at age 76. Flexible sigmoidoscopy or colonoscopy. You may have a sigmoidoscopy every 5 years or a colonoscopy every 10 years starting at age 76. Prostate cancer screening. Recommendations will vary depending on your family history and other risks. Hepatitis C blood test. Hepatitis B blood test. Sexually transmitted disease (STD) testing. Diabetes screening. This is done by checking your blood sugar (glucose) after you have not eaten for a while (fasting). You may have this done every 1-3 years. Abdominal aortic aneurysm (AAA) screening. You may need this if you are a current or former smoker. Osteoporosis. You may be screened starting at age 76 if you are at high risk. Talk with your health care provider about your test results, treatment options, and if necessary, the need for more tests. Vaccines  Your health care provider may recommend certain vaccines, such as: Influenza vaccine. This is recommended every year. Tetanus, diphtheria, and acellular pertussis (Tdap, Td) vaccine. You may need a Td booster every 10 years. Zoster vaccine. You may need this after age 76. Pneumococcal 13-valent conjugate (PCV13) vaccine. One dose is recommended after age 76. Pneumococcal polysaccharide (PPSV23) vaccine. One dose is recommended after age 76.  Talk to your  health care provider about which screenings and vaccines you need and how often you need them. This information is not intended to replace advice given to you by your health care provider. Make sure you discuss any questions you have with your health care provider. Document Released: 10/10/2015 Document Revised: 06/02/2016 Document Reviewed: 07/15/2015 Elsevier Interactive Patient Education  2017 [employer] Prevention in the Home Falls can cause injuries. They can happen to people of all ages. There are many things you can do to make your home safe and to help prevent falls. What can I do on the outside of my home? Regularly fix the edges of walkways and driveways and fix any cracks. Remove anything that might make you trip as you walk through a door, such as a raised step or threshold. Trim any bushes or trees on the path to your home. Use bright outdoor lighting. Clear any walking paths of anything that might make someone trip, such as rocks or tools. Regularly check to see if handrails are loose or broken. Make sure that both sides of any steps have handrails. Any raised decks and porches should have guardrails on the edges. Have any leaves, snow, or ice cleared regularly. Use sand or salt on walking paths during winter. Clean up any spills in your garage right away. This includes oil or grease spills. What can I do in the bathroom? Use night lights. Install grab bars by the toilet and in the tub and shower. Do not use towel bars as grab bars. Use non-skid mats or decals in the tub or shower. If you need to sit down in the shower, use a plastic, non-slip stool. Keep the floor dry. Clean up any water that spills on the floor as soon as it happens. Remove soap buildup in the tub or shower regularly. Attach bath mats securely with double-sided non-slip rug tape. Do not have throw rugs and other things on the floor that can make you trip. What can I do in the bedroom? Use night  lights. Make sure that you have a light by your bed that is easy to reach. Do not use any sheets or blankets that are too big for your bed. They should not hang down onto the floor. Have a firm chair that has side arms. You can use this for support while you get dressed. Do not have throw rugs and other things on the floor that can make you trip. What can I do in the kitchen? Clean up any spills right away. Avoid walking on wet floors. Keep items that you use a lot in easy-to-reach places. If you need to reach something above you, use a strong step stool that has a grab bar. Keep electrical cords out of the way. Do not use floor polish or wax that makes floors slippery. If you must use wax, use non-skid floor wax. Do not have throw rugs and other things on the floor that can make you trip. What can I do with my stairs? Do not leave any items on the stairs. Make sure that there are handrails on both sides of the stairs and use them. Fix handrails that are broken or loose. Make sure that handrails are as long as the stairways. Check any carpeting to make sure that it is firmly attached to the stairs. Fix any carpet that is loose or worn. Avoid having throw rugs at the top or bottom of the stairs. If you do have throw  rugs, attach them to the floor with carpet tape. Make sure that you have a light switch at the top of the stairs and the bottom of the stairs. If you do not have them, ask someone to add them for you. What else can I do to help prevent falls? Wear shoes that: Do not have high heels. Have rubber bottoms. Are comfortable and fit you well. Are closed at the toe. Do not wear sandals. If you use a stepladder: Make sure that it is fully opened. Do not climb a closed stepladder. Make sure that both sides of the stepladder are locked into place. Ask someone to hold it for you, if possible. Clearly mark and make sure that you can see: Any grab bars or handrails. First and last  steps. Where the edge of each step is. Use tools that help you move around (mobility aids) if they are needed. These include: Canes. Walkers. Scooters. Crutches. Turn on the lights when you go into a dark area. Replace any light bulbs as soon as they burn out. Set up your furniture so you have a clear path. Avoid moving your furniture around. If any of your floors are uneven, fix them. If there are any pets around you, be aware of where they are. Review your medicines with your doctor. Some medicines can make you feel dizzy. This can increase your chance of falling. Ask your doctor what other things that you can do to help prevent falls. This information is not intended to replace advice given to you by your health care provider. Make sure you discuss any questions you have with your health care provider. Document Released: 07/10/2009 Document Revised: 02/19/2016 Document Reviewed: 10/18/2014 Elsevier Interactive Patient Education  2017 [employer].

## 2022-03-25 ENCOUNTER — Telehealth: Payer: Self-pay

## 2022-03-25 NOTE — Telephone Encounter (Signed)
1st attempt to reach pt regarding surgical clearance and the need for a tele visit.  Left a message for pt to call back and ask for the preop team. 

## 2022-03-25 NOTE — Telephone Encounter (Signed)
   Pre-operative Risk Assessment    Patient Name: Jeremy Meadows  DOB: 1946-05-16 MRN: [id number]      Request for Surgical Clearance    Procedure:   Right Reverse Total Shoulder Arthroplasty  Date of Surgery:  Clearance 04/09/22                                 Surgeon:  Dr. Redmond Pulling Surgeon's Group or Practice Name:  [facility] Phone number:  [phone] Fax number:  [phone]   Type of Clearance Requested:   - Medical    Type of Anesthesia:  Not Indicated   Additional requests/questions:  Please advise surgeon/provider what medications should be held.  Signed, Elsie Lincoln Sean Malinowski   03/25/2022, 9:19 AM

## 2022-03-26 ENCOUNTER — Telehealth: Payer: Self-pay | Admitting: *Deleted

## 2022-03-26 NOTE — Telephone Encounter (Signed)
DPR ok to s/w the pt's wife who scheduled a tele visit for the pre op clearance 04/01/22 @ 3 pm . Med rec and consent are done.

## 2022-03-26 NOTE — Telephone Encounter (Signed)
DPR ok to s/w the pt's wife who scheduled a tele visit for the pre op clearance 04/01/22 @ 3 pm . Med rec and consent are done.     Patient Consent for Virtual Visit        Jeremy Meadows has provided verbal consent on 03/26/2022 for a virtual visit (video or telephone).   CONSENT FOR VIRTUAL VISIT FOR:  Jeremy Meadows  By participating in this virtual visit I agree to the following:  I hereby voluntarily request, consent and authorize [facility] and its employed or contracted physicians, physician assistants, nurse practitioners or other licensed health care professionals (the Practitioner), to provide me with telemedicine health care services (the "Services") as deemed necessary by the treating Practitioner. I acknowledge and consent to receive the Services by the Practitioner via telemedicine. I understand that the telemedicine visit will involve communicating with the Practitioner through live audiovisual communication technology and the disclosure of certain medical information by electronic transmission. I acknowledge that I have been given the opportunity to request an in-person assessment or other available alternative prior to the telemedicine visit and am voluntarily participating in the telemedicine visit.  I understand that I have the right to withhold or withdraw my consent to the use of telemedicine in the course of my care at any time, without affecting my right to future care or treatment, and that the Practitioner or I may terminate the telemedicine visit at any time. I understand that I have the right to inspect all information obtained and/or recorded in the course of the telemedicine visit and may receive copies of available information for a reasonable fee.  I understand that some of the potential risks of receiving the Services via telemedicine include:  Delay or interruption in medical evaluation due to technological equipment failure or disruption; Information  transmitted may not be sufficient (e.g. poor resolution of images) to allow for appropriate medical decision making by the Practitioner; and/or  In rare instances, security protocols could fail, causing a breach of personal health information.  Furthermore, I acknowledge that it is my responsibility to provide information about my medical history, conditions and care that is complete and accurate to the best of my ability. I acknowledge that Practitioner's advice, recommendations, and/or decision may be based on factors not within their control, such as incomplete or inaccurate data provided by me or distortions of diagnostic images or specimens that may result from electronic transmissions. I understand that the practice of medicine is not an exact science and that Practitioner makes no warranties or guarantees regarding treatment outcomes. I acknowledge that a copy of this consent can be made available to me via my patient portal ([facility]), or I can request a printed copy by calling the office of [facility].    I understand that my insurance will be billed for this visit.   I have read or had this consent read to me. I understand the contents of this consent, which adequately explains the benefits and risks of the Services being provided via telemedicine.  I have been provided ample opportunity to ask questions regarding this consent and the Services and have had my questions answered to my satisfaction. I give my informed consent for the services to be provided through the use of telemedicine in my medical care

## 2022-03-26 NOTE — Telephone Encounter (Signed)
Pt is returning call.  

## 2022-03-26 NOTE — Telephone Encounter (Signed)
Addition to previous note.  Note being forwarded to Pre-op Callback.

## 2022-03-26 NOTE — Telephone Encounter (Signed)
Patient returned call to Pre-Op.

## 2022-03-27 HISTORY — PX: REVERSE TOTAL SHOULDER ARTHROPLASTY: SHX2344

## 2022-04-01 ENCOUNTER — Ambulatory Visit (INDEPENDENT_AMBULATORY_CARE_PROVIDER_SITE_OTHER): Payer: Medicare HMO | Admitting: Nurse Practitioner

## 2022-04-01 DIAGNOSIS — Z01810 Encounter for preprocedural cardiovascular examination: Secondary | ICD-10-CM

## 2022-04-01 NOTE — Progress Notes (Signed)
Virtual Visit via Telephone Note   Because of Jeremy Meadows's co-morbid illnesses, he is at least at moderate risk for complications without adequate follow up.  This format is felt to be most appropriate for this patient at this time.  The patient did not have access to video technology/had technical difficulties with video requiring transitioning to audio format only (telephone).  All issues noted in this document were discussed and addressed.  No physical exam could be performed with this format.  Please refer to the patient's chart for his consent to telehealth for Jeremy Meadows.  Evaluation Performed:  Preoperative cardiovascular risk assessment _____________   Date:  04/01/2022   Patient ID:  Jeremy Meadows, DOB 12-15-1945, MRN [id number] Patient Location:  Home Provider location:   Office  Primary Care Provider:  Ria Bush, MD Primary Cardiologist:  Peter [name], MD  Chief Complaint / Patient Profile   76 y.o. y/o male with a h/o aortic stenosis s/p AVR in 2015, thoracic aortic aneurysm s/p grafting, PVCs, RBBB, post-op atrial fibrillation, hypertension, and OSA who is pending right reverse total shoulder arthroplasty on 04/09/2022 with Dr. Redmond Pulling of [facility] and presents today for telephonic preoperative cardiovascular risk assessment.  Past Medical History    Past Medical History:  Diagnosis Date   Allergic rhinitis, cause unspecified    Aortic insufficiency    s/p AVR 2015   Aortic root enlargement ([place])    thoracic aorta   Aortic valve stenosis 2015   s/p AVR 2015 [name]) - completed cardiac rehab 08/2014   Arthritis    Asthma    Atrial fibrillation ([place]) 07/23/2014   Benign neoplasm of colon    Gallstones    by CT   History of kidney stones    History of nephrolithiasis    HLD (hyperlipidemia)    Hypertension    Need for prophylactic antibiotic    Obstructive sleep apnea (adult) (pediatric)    pt. doesn't use his  machine at home.   Other testicular hypofunction    Pulmonary nodule, left 2015   2cm, ?sarcoid by biopsy   Unspecified glaucoma(365.9)    Past Surgical History:  Procedure Laterality Date   AORTIC VALVE REPLACEMENT N/A 07/02/2014   Procedure: AORTIC VALVE REPLACEMENT (AVR) with 23 Aortic Magna Ease;  Surgeon: Grace Isaac, MD   ASCENDING AORTIC ROOT REPLACEMENT N/A 07/02/2014   Procedure: supra coronary  ASCENDING AORTIC REPLACEMENT to the Inominate Artery with 97m Hemashield Platinum with  circulatory arrest;  Surgeon: EGrace Isaac MD   CARDIAC CATHETERIZATION     CARDIAC VALVE REPLACEMENT     CATARACT EXTRACTION Bilateral    COLONOSCOPY  2013   WNL, rpt 5 yrs ([name]   COLONOSCOPY  2018   1 TA, diverticulosis, rpt 5 yrs ([name]   EYE SURGERY     INTRAOPERATIVE TRANSESOPHAGEAL ECHOCARDIOGRAM N/A 07/02/2014   JOINT REPLACEMENT Right 14   partial replacement   KNEE SURGERY Right 05/2012   arthroscopic   KNEE SURGERY Right 12/2013   patella   LEFT AND RIGHT HEART CATHETERIZATION WITH CORONARY ANGIOGRAM N/A 05/22/2014   no significant obstructive CAD ([place]   LITHOTRIPSY     LUNG BIOPSY Left 2015   by IR - benign   MEDIAL PARTIAL KNEE REPLACEMENT Right 03/2013   RHINOPLASTY  1980s   VENTRAL HERNIA REPAIR  08/26/2015   VENTRAL HERNIA REPAIR N/A 08/26/2015   Procedure: VENTRAL HERNIA REPAIR WITH MESH ADULT;  Surgeon: EGrace Isaac MD;  Location: [facility] OR;  Service: Vascular;  Laterality: N/A;    Allergies  Allergies  Allergen Reactions   Ace Inhibitors Cough    Hacking cough    History of Present Illness    Jeremy Meadows is a 76 y.o. male who presents via audio/video conferencing for a telehealth visit today.  Pt was last seen in cardiology clinic on 09/14/2021 by Dr. [name].  At that time Jeremy Meadows was doing well.  The patient is now pending procedure as outlined above. Since his last visit, he has done well from a cardiac standpoint. He is active  and plays softball recreationally. He denies chest pain, palpitations, dyspnea, pnd, orthopnea, n, v, dizziness, syncope, edema, weight gain, or early satiety. All other systems reviewed and are otherwise negative except as noted above.   Home Medications    Prior to Admission medications   Medication Sig Start Date End Date Taking? Authorizing Provider  albuterol (VENTOLIN HFA) 108 (90 Base) MCG/ACT inhaler Inhale 2 puffs into the lungs every 6 (six) hours as needed for wheezing or shortness of breath. 03/09/21   Ria Bush, MD  amLODipine (NORVASC) 2.5 MG tablet Take 1 tablet (2.5 mg total) by mouth daily. 09/14/21   [name], Peter M, MD  aspirin 81 MG tablet Take 81 mg by mouth daily.    [provider]  atorvastatin (LIPITOR) 10 MG tablet TAKE 1 TABLET EVERY DAY AT [facility] 09/14/21   [name], Peter M, MD  cholecalciferol (VITAMIN D) 1000 UNITS tablet Take 1,000 Units by mouth daily.    [provider]  dorzolamide-timolol (COSOPT) 22.3-6.8 MG/ML ophthalmic solution 1 drop 2 (two) times daily.    [provider]  metoprolol tartrate (LOPRESSOR) 50 MG tablet TAKE 1 TABLET TWICE DAILY 01/06/22   [name], Peter M, MD  vitamin C (ASCORBIC ACID) 500 MG tablet Take 500 mg by mouth daily.     [provider]  Zoster Vaccine Adjuvanted [facility]) injection Inject into the muscle. 10/05/21   Carlyle Basques, MD    Physical Exam    Vital Signs:  Jeremy Meadows does not have vital signs available for review today.  Given telephonic nature of communication, physical exam is limited. AAOx3. NAD. Normal affect.  Speech and respirations are unlabored.  Accessory Clinical Findings    None  Assessment & Plan    1.  Preoperative Cardiovascular Risk Assessment:  According to the Revised Cardiac Risk Index (RCRI), his Perioperative Risk of Major Cardiac Event is (%): 0.4. His Functional Capacity in METs is: 9.25 according to the [facility] Activity Status Index (DASI).  Therefore, based on ACC/AHA guidelines, patient would be at acceptable risk for the planned procedure without further cardiovascular testing.  A copy of this note will be routed to requesting surgeon.  Time:   Today, I have spent 6 minutes with the patient with telehealth technology discussing medical history, symptoms, and management plan.     Lenna Sciara, NP  04/01/2022, 3:08 PM

## 2022-04-07 ENCOUNTER — Encounter: Payer: Self-pay | Admitting: Gastroenterology

## 2022-04-07 DIAGNOSIS — Z01812 Encounter for preprocedural laboratory examination: Secondary | ICD-10-CM | POA: Diagnosis not present

## 2022-04-09 DIAGNOSIS — M24511 Contracture, right shoulder: Secondary | ICD-10-CM | POA: Diagnosis not present

## 2022-04-09 DIAGNOSIS — I10 Essential (primary) hypertension: Secondary | ICD-10-CM | POA: Diagnosis not present

## 2022-04-09 DIAGNOSIS — G4733 Obstructive sleep apnea (adult) (pediatric): Secondary | ICD-10-CM | POA: Diagnosis not present

## 2022-04-09 DIAGNOSIS — M19111 Post-traumatic osteoarthritis, right shoulder: Secondary | ICD-10-CM | POA: Diagnosis not present

## 2022-04-09 DIAGNOSIS — E785 Hyperlipidemia, unspecified: Secondary | ICD-10-CM | POA: Diagnosis not present

## 2022-04-09 DIAGNOSIS — J45990 Exercise induced bronchospasm: Secondary | ICD-10-CM | POA: Diagnosis not present

## 2022-04-09 DIAGNOSIS — M75101 Unspecified rotator cuff tear or rupture of right shoulder, not specified as traumatic: Secondary | ICD-10-CM | POA: Diagnosis not present

## 2022-04-09 DIAGNOSIS — Z954 Presence of other heart-valve replacement: Secondary | ICD-10-CM | POA: Diagnosis not present

## 2022-04-09 DIAGNOSIS — G8918 Other acute postprocedural pain: Secondary | ICD-10-CM | POA: Diagnosis not present

## 2022-04-09 DIAGNOSIS — Z952 Presence of prosthetic heart valve: Secondary | ICD-10-CM | POA: Diagnosis not present

## 2022-04-09 DIAGNOSIS — M19011 Primary osteoarthritis, right shoulder: Secondary | ICD-10-CM | POA: Diagnosis not present

## 2022-04-10 DIAGNOSIS — I10 Essential (primary) hypertension: Secondary | ICD-10-CM | POA: Diagnosis not present

## 2022-04-10 DIAGNOSIS — Z952 Presence of prosthetic heart valve: Secondary | ICD-10-CM | POA: Diagnosis not present

## 2022-04-10 DIAGNOSIS — G4733 Obstructive sleep apnea (adult) (pediatric): Secondary | ICD-10-CM | POA: Diagnosis not present

## 2022-04-10 DIAGNOSIS — M75101 Unspecified rotator cuff tear or rupture of right shoulder, not specified as traumatic: Secondary | ICD-10-CM | POA: Diagnosis not present

## 2022-04-10 DIAGNOSIS — E785 Hyperlipidemia, unspecified: Secondary | ICD-10-CM | POA: Diagnosis not present

## 2022-04-10 DIAGNOSIS — J45990 Exercise induced bronchospasm: Secondary | ICD-10-CM | POA: Diagnosis not present

## 2022-04-10 DIAGNOSIS — M19011 Primary osteoarthritis, right shoulder: Secondary | ICD-10-CM | POA: Diagnosis not present

## 2022-04-22 DIAGNOSIS — M19111 Post-traumatic osteoarthritis, right shoulder: Secondary | ICD-10-CM | POA: Diagnosis not present

## 2022-04-23 DIAGNOSIS — Z96611 Presence of right artificial shoulder joint: Secondary | ICD-10-CM | POA: Diagnosis not present

## 2022-04-26 DIAGNOSIS — Z96611 Presence of right artificial shoulder joint: Secondary | ICD-10-CM | POA: Diagnosis not present

## 2022-05-07 DIAGNOSIS — Z96611 Presence of right artificial shoulder joint: Secondary | ICD-10-CM | POA: Diagnosis not present

## 2022-05-11 DIAGNOSIS — Z96611 Presence of right artificial shoulder joint: Secondary | ICD-10-CM | POA: Diagnosis not present

## 2022-05-14 DIAGNOSIS — Z96611 Presence of right artificial shoulder joint: Secondary | ICD-10-CM | POA: Diagnosis not present

## 2022-05-17 DIAGNOSIS — Z96611 Presence of right artificial shoulder joint: Secondary | ICD-10-CM | POA: Diagnosis not present

## 2022-05-19 DIAGNOSIS — Z96611 Presence of right artificial shoulder joint: Secondary | ICD-10-CM | POA: Diagnosis not present

## 2022-05-26 DIAGNOSIS — Z96611 Presence of right artificial shoulder joint: Secondary | ICD-10-CM | POA: Diagnosis not present

## 2022-06-02 DIAGNOSIS — Z96611 Presence of right artificial shoulder joint: Secondary | ICD-10-CM | POA: Diagnosis not present

## 2022-06-07 DIAGNOSIS — H5203 Hypermetropia, bilateral: Secondary | ICD-10-CM | POA: Diagnosis not present

## 2022-06-07 DIAGNOSIS — Z96611 Presence of right artificial shoulder joint: Secondary | ICD-10-CM | POA: Diagnosis not present

## 2022-06-09 ENCOUNTER — Encounter: Payer: Self-pay | Admitting: Family Medicine

## 2022-06-10 DIAGNOSIS — Z96611 Presence of right artificial shoulder joint: Secondary | ICD-10-CM | POA: Diagnosis not present

## 2022-06-14 DIAGNOSIS — Z96611 Presence of right artificial shoulder joint: Secondary | ICD-10-CM | POA: Diagnosis not present

## 2022-06-17 DIAGNOSIS — Z96611 Presence of right artificial shoulder joint: Secondary | ICD-10-CM | POA: Diagnosis not present

## 2022-06-21 DIAGNOSIS — Z96611 Presence of right artificial shoulder joint: Secondary | ICD-10-CM | POA: Diagnosis not present

## 2022-06-24 DIAGNOSIS — Z96611 Presence of right artificial shoulder joint: Secondary | ICD-10-CM | POA: Diagnosis not present

## 2022-06-29 DIAGNOSIS — Z96611 Presence of right artificial shoulder joint: Secondary | ICD-10-CM | POA: Diagnosis not present

## 2022-07-01 DIAGNOSIS — Z96611 Presence of right artificial shoulder joint: Secondary | ICD-10-CM | POA: Diagnosis not present

## 2022-07-05 DIAGNOSIS — Z96611 Presence of right artificial shoulder joint: Secondary | ICD-10-CM | POA: Diagnosis not present

## 2022-07-13 DIAGNOSIS — Z96611 Presence of right artificial shoulder joint: Secondary | ICD-10-CM | POA: Diagnosis not present

## 2022-07-16 DIAGNOSIS — Z96611 Presence of right artificial shoulder joint: Secondary | ICD-10-CM | POA: Diagnosis not present

## 2022-07-20 DIAGNOSIS — Z96611 Presence of right artificial shoulder joint: Secondary | ICD-10-CM | POA: Diagnosis not present

## 2022-07-23 DIAGNOSIS — Z96611 Presence of right artificial shoulder joint: Secondary | ICD-10-CM | POA: Diagnosis not present

## 2022-07-27 DIAGNOSIS — Z96611 Presence of right artificial shoulder joint: Secondary | ICD-10-CM | POA: Diagnosis not present

## 2022-07-29 DIAGNOSIS — Z96611 Presence of right artificial shoulder joint: Secondary | ICD-10-CM | POA: Diagnosis not present

## 2022-08-02 DIAGNOSIS — Z96611 Presence of right artificial shoulder joint: Secondary | ICD-10-CM | POA: Diagnosis not present

## 2022-08-03 DIAGNOSIS — Z96611 Presence of right artificial shoulder joint: Secondary | ICD-10-CM | POA: Diagnosis not present

## 2022-08-05 DIAGNOSIS — Z96611 Presence of right artificial shoulder joint: Secondary | ICD-10-CM | POA: Diagnosis not present

## 2022-08-12 DIAGNOSIS — Z96611 Presence of right artificial shoulder joint: Secondary | ICD-10-CM | POA: Diagnosis not present

## 2022-08-16 DIAGNOSIS — Z96611 Presence of right artificial shoulder joint: Secondary | ICD-10-CM | POA: Diagnosis not present

## 2022-08-18 DIAGNOSIS — Z96611 Presence of right artificial shoulder joint: Secondary | ICD-10-CM | POA: Diagnosis not present

## 2022-08-24 DIAGNOSIS — Z96611 Presence of right artificial shoulder joint: Secondary | ICD-10-CM | POA: Diagnosis not present

## 2022-08-26 DIAGNOSIS — Z96611 Presence of right artificial shoulder joint: Secondary | ICD-10-CM | POA: Diagnosis not present

## 2022-09-01 ENCOUNTER — Telehealth: Payer: Self-pay

## 2022-09-01 NOTE — Progress Notes (Signed)
    Chronic Care Management Pharmacy Assistant   Name: Jeremy Meadows  MRN: [id number] DOB: 1946/03/13  Reason for Encounter: CCM (Appointment Reminder)  Medications: Outpatient Encounter Medications as of 09/01/2022  Medication Sig   albuterol (VENTOLIN HFA) 108 (90 Base) MCG/ACT inhaler Inhale 2 puffs into the lungs every 6 (six) hours as needed for wheezing or shortness of breath.   amLODipine (NORVASC) 2.5 MG tablet Take 1 tablet (2.5 mg total) by mouth daily.   aspirin 81 MG tablet Take 81 mg by mouth daily.   atorvastatin (LIPITOR) 10 MG tablet TAKE 1 TABLET EVERY DAY AT 6PM   cholecalciferol (VITAMIN D) 1000 UNITS tablet Take 1,000 Units by mouth daily.   dorzolamide-timolol (COSOPT) 22.3-6.8 MG/ML ophthalmic solution 1 drop 2 (two) times daily.   metoprolol tartrate (LOPRESSOR) 50 MG tablet TAKE 1 TABLET TWICE DAILY   vitamin C (ASCORBIC ACID) 500 MG tablet Take 500 mg by mouth daily.    Zoster Vaccine Adjuvanted [facility]) injection Inject into the muscle.   Facility-Administered Encounter Medications as of 09/01/2022  Medication   0.9 %  sodium chloride infusion   Jeremy Meadows was contacted to remind of upcoming telephone visit with Charlene Brooke on 09/06/2022 at 1:30. Patient was reminded to have any blood glucose and blood pressure readings available for review at appointment.   Message was left reminding patient of appointment.   CCM referral has been placed prior to visit?  No   Star Rating Drugs: Medication:  Last Fill: Day Supply Atorvastatin 10 mg 05/03/2022 90 Verified with [facility]  Charlene Brooke, CPP notified  Marijean Niemann, [place] Pharmacy Assistant [phone]

## 2022-09-02 DIAGNOSIS — Z96611 Presence of right artificial shoulder joint: Secondary | ICD-10-CM | POA: Diagnosis not present

## 2022-09-06 ENCOUNTER — Ambulatory Visit: Payer: Medicare HMO | Admitting: Pharmacist

## 2022-09-06 ENCOUNTER — Telehealth: Payer: Medicare HMO

## 2022-09-06 VITALS — BP 122/82

## 2022-09-06 DIAGNOSIS — J4520 Mild intermittent asthma, uncomplicated: Secondary | ICD-10-CM

## 2022-09-06 DIAGNOSIS — I10 Essential (primary) hypertension: Secondary | ICD-10-CM

## 2022-09-06 DIAGNOSIS — E7800 Pure hypercholesterolemia, unspecified: Secondary | ICD-10-CM

## 2022-09-06 NOTE — Progress Notes (Signed)
Chronic Care Management Pharmacy Note  09/06/2022 Name:  Jeremy Meadows MRN:  [id number] DOB:  [birth year]  Summary: CCM F/U visit -HTN: pt reports BP 120s/80s at home; denies dizziness/lightheadedness -Pt has not seen PCP since 07/2021 - advised he make appt for annual visit at his convenience  Recommendations/Changes made from today's visit: -No med changes  Plan: -[place] will call patient in 6 months for general adherence -Pharmacist follow up televisit PRN -Due for annual PCP visit (last annual 02/2021)    Subjective: Jeremy Meadows is an 76 y.o. year old male who is a primary patient of Ria Bush, MD.  The CCM team was consulted for assistance with disease management and care coordination needs.    Engaged with patient by telephone for follow up visit in response to provider referral for pharmacy case management and/or care coordination services.   Consent to Services:  The patient was given information about Chronic Care Management services, agreed to services, and gave verbal consent prior to initiation of services.  Please see initial visit note for detailed documentation.   Patient Care Team: Ria Bush, MD as PCP - General (Family Medicine) [name], Peter M, MD as PCP - Cardiology (Cardiology) [name], Peter M, MD as Consulting Physician (Cardiology) Grace Isaac, MD (Inactive) as Consulting Physician (Cardiothoracic Surgery) Charlton Haws, [facility] as Pharmacist (Pharmacist)  Patient lives at home with his wife. He is fairly active and plays softball March-November.  Recent office visits: 07/31/2021 - Ria Bush, MD - Video Visit - Patient presented for cough. Anticipate acute exacerbation of asthmatic bronchitis. Start: azithromycin (ZITHROMAX) 250 MG tablet for five days. Start: Cherratussin, prednixone 20 mg x 7 days.   03/09/2021 - Ria Bush, MD - Patient presented for Annual Wellness Visit. Start: traZODone  (DESYREL) 50 MG tablet at bedtime PRN for sleep. No lab work reported.   Recent consult visits: 08/06/2021 - Karl Ito, NP - Pain Medicine - Patient presented for cough. Chest Xray c/w bronchopneumonia. Start: Flonase, Augmentin 875-125 mg x 7 days.  07/07/2021 - [place] booster given.  06/09/2021 - Vonna Drafts - Orthopedic Surgery - Patient presented for Osteoarthritis, right shoulder. Procedure: X-ray shoulder 2+ VW. No other information.   06/04/2021 - [place] - Patient presented for Myopia, bilateral. Procedure: Routine Ophthalmological exam. No other information.   Hospital visits: None in previous 6 months   Objective:  Lab Results  Component Value Date   CREATININE 1.10 03/02/2021   BUN 24 (H) 03/02/2021   GFR 65.74 03/02/2021   GFRNONAA 52 (L) 05/30/2018   GFRAA 60 05/30/2018   NA 140 03/02/2021   K 4.5 03/02/2021   CALCIUM 9.5 03/02/2021   CO2 27 03/02/2021   GLUCOSE 103 (H) 03/02/2021    Lab Results  Component Value Date/Time   HGBA1C 5.8 03/02/2021 08:28 AM   HGBA1C 5.6 08/15/2019 09:35 AM   GFR 65.74 03/02/2021 08:28 AM   GFR 55.97 (L) 08/15/2019 09:35 AM   MICROALBUR 1.3 03/02/2021 08:28 AM   MICROALBUR 2.1 (H) 08/15/2019 09:36 AM    Last diabetic Eye exam: No results found for: "HMDIABEYEEXA"  Last diabetic Foot exam: No results found for: "HMDIABFOOTEX"   Lab Results  Component Value Date   CHOL 113 03/02/2021   HDL 31.30 (L) 03/02/2021   LDLCALC 49 03/02/2021   LDLDIRECT 67.0 08/15/2019   TRIG 159.0 (H) 03/02/2021   CHOLHDL 4 03/02/2021       Latest Ref  Rng & Units 03/02/2021    8:28 AM 08/15/2019    9:35 AM 05/30/2018    3:59 PM  Hepatic Function  Total Protein 6.0 - 8.3 g/dL 6.9  7.0  7.3   Albumin 3.5 - 5.2 g/dL 4.3  4.4  4.5   AST 0 - 37 U/L 25  34  26   ALT 0 - 53 U/L 19  43  21   Alk Phosphatase 39 - 117 U/L 78  90  80   Total Bilirubin 0.2 - 1.2 mg/dL 0.9  0.8  0.4     Lab Results   Component Value Date/Time   TSH 2.09 08/15/2019 09:35 AM   TSH 2.120 05/30/2018 03:59 PM       Latest Ref Rng & Units 03/02/2021    8:28 AM 05/30/2018    3:59 PM 03/31/2017   12:22 AM  CBC  WBC 4.0 - 10.5 K/uL 7.4  7.8  9.5   Hemoglobin 13.0 - 17.0 g/dL 15.2  16.1  16.2   Hematocrit 39.0 - 52.0 % 43.8  47.8  46.7   Platelets 150.0 - 400.0 K/uL 159.0  171  173     Lab Results  Component Value Date/Time   VD25OH 34 11/19/2010 07:35 PM    Clinical ASCVD: Yes  The ASCVD Risk score (Arnett DK, et al., 2019) failed to calculate for the following reasons:   The valid total cholesterol range is 130 to 320 mg/dL       03/16/2022   10:31 AM 03/09/2021    2:07 PM 08/15/2019   11:17 AM  Depression screen PHQ 2/9  Decreased Interest 0 0 0  Down, Depressed, Hopeless 0 0 0  PHQ - 2 Score 0 0 0  Altered sleeping   0  Tired, decreased energy   0  Change in appetite   0  Feeling bad or failure about yourself    0  Trouble concentrating   0  Moving slowly or fidgety/restless   0  Suicidal thoughts   0  PHQ-9 Score   0  Difficult doing work/chores   Not difficult at all     Social History   Tobacco Use  Smoking Status Never  Smokeless Tobacco Never   BP Readings from Last 3 Encounters:  09/06/22 122/82  09/14/21 (!) 130/100  08/06/21 132/84   Pulse Readings from Last 3 Encounters:  09/14/21 68  08/06/21 69  07/31/21 68   Wt Readings from Last 3 Encounters:  03/16/22 220 lb (99.8 kg)  09/14/21 112 lb 12.8 oz (51.2 kg)  08/06/21 214 lb 4 oz (97.2 kg)   BMI Readings from Last 3 Encounters:  03/16/22 30.68 kg/m  09/14/21 15.51 kg/m  08/06/21 29.88 kg/m    Assessment/Interventions: Review of patient past medical history, allergies, medications, health status, including review of consultants reports, laboratory and other test data, was performed as part of comprehensive evaluation and provision of chronic care management services.   SDOH:  (Social Determinants of Health)  assessments and interventions performed: Yes SDOH Interventions    Flowsheet Row Clinical Support from 08/15/2019 in [place] at [place] Interventions   Depression Interventions/Treatment  DBZ2-0 Score <4 Follow-up Not Indicated      SDOH Screenings   Food Insecurity: No Food Insecurity (03/16/2022)  Transportation Needs: No Transportation Needs (03/16/2022)  Depression (PHQ2-9): Low Risk  (03/16/2022)  Financial Resource Strain: Low Risk  (03/16/2022)  Physical Activity: Sufficiently Active (03/16/2022)  Stress: No Stress  Concern Present (03/16/2022)  Tobacco Use: Low Risk  (06/09/2022)    CCM Care Plan  Allergies  Allergen Reactions   Ace Inhibitors Cough    Hacking cough    Medications Reviewed Today     Reviewed by Charlton Haws, [facility] (Pharmacist) on 09/13/22 at [place] List Status: <None>   Medication Order Taking? Sig Documenting Provider Last Dose Status Informant  0.9 %  sodium chloride infusion [id number]   [name], MD  Active   albuterol (VENTOLIN HFA) 108 (90 Base) MCG/ACT inhaler [id number] Yes Inhale 2 puffs into the lungs every 6 (six) hours as needed for wheezing or shortness of breath. Ria Bush, MD Taking Active   amLODipine [facility]) 2.5 MG tablet [id number] Yes Take 1 tablet (2.5 mg total) by mouth daily. [name], Peter M, MD Taking Active   aspirin 81 MG tablet [id number] Yes Take 81 mg by mouth daily. [provider] Taking Active Self  atorvastatin (LIPITOR) 10 MG tablet [id number] Yes TAKE 1 TABLET EVERY DAY AT 6PM [name], Peter M, MD Taking Active   cholecalciferol (VITAMIN D) 1000 UNITS tablet [id number] Yes Take 1,000 Units by mouth daily. [provider] Taking Active Self  dorzolamide-timolol (COSOPT) 22.3-6.8 MG/ML ophthalmic solution [id number] Yes 1 drop 2 (two) times daily. [provider] Taking Active   metoprolol tartrate (LOPRESSOR) 50 MG tablet [id number] Yes TAKE 1 TABLET TWICE DAILY  [name], Peter M, MD Taking Active   vitamin C (ASCORBIC ACID) 500 MG tablet [id number] Yes Take 500 mg by mouth daily.  [provider] Taking Active Self            Patient Active Problem List   Diagnosis Date Noted   Insomnia 03/09/2021   Leg cramps 03/09/2021   Pre-operative clearance 07/03/2020   Traumatic rotator cuff tear, left, initial encounter 06/27/2020   Traumatic rotator cuff tear, right, sequela 12/19/2018   History of colonic polyps 02/18/2017   Thrombosed external hemorrhoid 02/18/2017   CAD (coronary artery disease) 10/17/2016   Thoracic aortic atherosclerosis ([place]) 10/17/2016   Acute cough 08/12/2015   Health maintenance examination 08/12/2015   Advanced care planning/counseling discussion 08/05/2014   Medicare annual wellness visit, subsequent 08/05/2014   Prediabetes 08/05/2014   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   S/P AVR (aortic valve replacement) and aortoplasty 07/23/2014   Thoracic ascending aortic aneurysm ([place]) 07/02/2014   Upper respiratory tract infection 06/04/2014   Sarcoidosis 04/26/2014   Asthmatic bronchitis 04/24/2014   Pulmonary nodule, left    Gross hematuria 11/06/2013   Essential hypertension 08/29/2013   Aortic insufficiency    Aortic valve stenosis, severe    DJD (degenerative joint disease) 02/27/2013   Wedge compression fracture of thoracic vertebra ([place]) 02/27/2013   TESTICULAR HYPOFUNCTION 09/08/2010   Recurrent nephrolithiasis 08/23/2009   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   Glaucoma 08/22/2008   HYPERCHOLESTEROLEMIA 12/18/2007   Obstructive sleep apnea 12/18/2007   ALLERGIC RHINITIS 12/18/2007    Immunization History  Administered Date(s) Administered   COVID-19, mRNA, vaccine(Comirnaty)12 years and older 06/18/2022   Fluad Quad(high Dose 65+) 05/29/2019, 07/02/2020, 07/07/2021   Influenza Split 09/07/2011, 07/04/2012   Influenza Whole 06/27/2010   Influenza, High Dose Seasonal PF 06/18/2022   Influenza,inj,Quad  PF,6+ Mos 07/03/2013, 06/18/2014, 07/10/2015, 07/28/2016, 07/01/2017, 06/22/2018   Influenza-Unspecified 07/03/2013, 06/18/2014, 07/10/2015, 07/28/2016, 07/01/2017, 06/22/2018   PFIZER Comirnaty(Gray Top)Covid-19 Tri-Sucrose Vaccine 02/10/2021   PFIZER(Purple Top)SARS-COV-2 Vaccination 11/09/2019, 12/05/2019, 08/01/2020   Pfizer Covid-19 Vaccine Bivalent Booster 55yr & up 07/07/2021   Pneumococcal Conjugate-13  08/05/2014   Pneumococcal Polysaccharide-23 06/27/2010, 12/02/2011   Tdap 12/02/2011   Zoster Recombinat (Shingrix) 10/05/2021, 01/19/2022   Zoster, Live 09/12/2014    Conditions to be addressed/monitored:  Hypertension, Hyperlipidemia, and Coronary Artery Disease  Care Plan : [place]  Updates made by Charlton Haws, [place] since 09/13/2022 12:00 AM     Problem: Hypertension, Hyperlipidemia, and Coronary Artery Disease   Priority: High     Long-Range Goal: Disase mgmt   Start Date: 09/07/2021  Expected End Date: 09/07/2022  This Visit's Progress: On track  Recent Progress: On track  Priority: High  Note:   Current Barriers:  None identified  Pharmacist Clinical Goal(s):  Patient will contact provider office for questions/concerns as evidenced notation of same in electronic health record through collaboration with PharmD and provider.   Interventions: 1:1 collaboration with Ria Bush, MD regarding development and update of comprehensive plan of care as evidenced by provider attestation and co-signature Inter-disciplinary care team collaboration (see longitudinal plan of care) Comprehensive medication review performed; medication list updated in electronic medical record  Hypertension (BP goal <140/90) -Controlled - pt uses smart watch to monitor BP periodically; he denies dizziness/lightheadedness -Current home readings: 120/80s -Hx OSA; s/p AVR 2015 -Current treatment: Amlodipine 2.5 mg daily - Appropriate, Effective, Safe,  Accessible Metoprolol tartrate 50 mg BID - Appropriate, Effective, Safe, Accessible -Medications previously tried: lisinopril (cough) -Educated on BP goals and benefits of medications for prevention of heart attack, stroke and kidney damage; -Counseled to monitor BP at home periodically -Recommended to continue current medication  Hyperlipidemia: (LDL goal < 70) -Controlled - LDL 49 (02/2021) at goal; pt endorses compliance with atorvastatin; he reports he has enough pills on hand to last until next refill (cardiology f/u appt next week) -Hx aortic atherosclerosis by CT -Current treatment: Atorvastatin 10 mg daily - Appropriate, Effective, Safe, Accessible Aspirin 81 mg daily - Appropriate, Effective, Safe, Accessible -Educated on Cholesterol goals; Benefits of statin for ASCVD risk reduction; -Recommended to continue current medication  Atrial Fibrillation (Goal: prevent stroke and major bleeding) -Controlled - lone episode Afib 2015. Monitoring for recurrence.  Asthmatic bronchitis (Goal: control symptoms and prevent exacerbations) -Controlled - pt denies frequent symptoms, he uses albuterol very rarely when exercising -Hx CAP Nov 2022 tx with Zpak then Augmentin -Current treatment  Albuterol HFA prn - rare use (once this year) - Appropriate, Effective, Safe, Accessible -Pulmonary function testing: PFTs 03/2014 - moderate central airway obstructive disease - FVC 79%, FEV1 pre 73% and post 85%, ratio 0.68 -Exacerbations requiring treatment in last 6 months: 1 -Counseled on Proper inhaler technique; -Recommended to continue current medication  Insomnia (Goal: manage symptoms) -Controlled - pt reports he goes to bed 11:30pm, wakes up 4-6am; he reports he feels rested -Current treatment  Melatonin 5 mg - Appropriate, Effective, Safe, Accessible -Medications previously tried: trazodone  -Recommended to continue current medication  Health Maintenance -Vaccine gaps: none -Hx recurrent  nephrolithiasis, remote -Pt is due for annual PCP visit - advised pt to make appt at his convenience  Patient Goals/Self-Care Activities Patient will:  - take medications as prescribed as evidenced by patient report and record review -focus on medication adherence by routine -check blood pressure periodically     Medication Assistance: None required.  Patient affirms current coverage meets needs.  Compliance/Adherence/Medication fill history: Care Gaps: None  Star-Rating Drugs: Atorvastatin - PDC 100%  Medication Access: Within the past 30 days, how often has patient missed a dose of medication? 0 Is a  pillbox or other method used to improve adherence? Yes  Factors that may affect medication adherence? no barriers identified Are meds synced by current pharmacy? No  Are meds delivered by current pharmacy? Yes  Does patient experience delays in picking up medications due to transportation concerns? No   [facility] Services Reviewed: Is patient disadvantaged to use [facility]?: Yes  Current Rx insurance plan: [employer] Name and location of Current pharmacy:  [id number] [place] [id number]: [phone]: [phone] [address]Phone: [phone]: [phone] [facility] services reviewed with patient today?: No  Patient requests to transfer care to [facility]?: No  Reason patient declined to change pharmacies: Disadvantaged due to insurance/mail order   Care Plan and Follow Up Patient Decision:  Patient agrees to Care Plan and Follow-up.  Plan: The patient has been provided with contact information for the care management team and has been advised to call with any health related questions or concerns.   LCharlene Brooke PharmD, BCACP Clinical  Pharmacist [facility] [phone]

## 2022-09-13 NOTE — Patient Instructions (Signed)
Visit Information  Phone number for Pharmacist: [phone]   Goals Addressed   None     Care Plan : [place]  Updates made by Charlton Haws, [place] since 09/13/2022 12:00 AM     Problem: Hypertension, Hyperlipidemia, and Coronary Artery Disease   Priority: High     Long-Range Goal: Disase mgmt   Start Date: 09/07/2021  Expected End Date: 09/07/2022  This Visit's Progress: On track  Recent Progress: On track  Priority: High  Note:   Current Barriers:  None identified  Pharmacist Clinical Goal(s):  Patient will contact provider office for questions/concerns as evidenced notation of same in electronic health record through collaboration with PharmD and provider.   Interventions: 1:1 collaboration with Ria Bush, MD regarding development and update of comprehensive plan of care as evidenced by provider attestation and co-signature Inter-disciplinary care team collaboration (see longitudinal plan of care) Comprehensive medication review performed; medication list updated in electronic medical record  Hypertension (BP goal <140/90) -Controlled - pt uses smart watch to monitor BP periodically; he denies dizziness/lightheadedness -Current home readings: 120/80s -Hx OSA; s/p AVR 2015 -Current treatment: Amlodipine 2.5 mg daily - Appropriate, Effective, Safe, Accessible Metoprolol tartrate 50 mg BID - Appropriate, Effective, Safe, Accessible -Medications previously tried: lisinopril (cough) -Educated on BP goals and benefits of medications for prevention of heart attack, stroke and kidney damage; -Counseled to monitor BP at home periodically -Recommended to continue current medication  Hyperlipidemia: (LDL goal < 70) -Controlled - LDL 49 (02/2021) at goal; pt endorses compliance with atorvastatin; he reports he has enough pills on hand to last until next refill (cardiology f/u appt next week) -Hx aortic atherosclerosis by CT -Current  treatment: Atorvastatin 10 mg daily - Appropriate, Effective, Safe, Accessible Aspirin 81 mg daily - Appropriate, Effective, Safe, Accessible -Educated on Cholesterol goals; Benefits of statin for ASCVD risk reduction; -Recommended to continue current medication  Atrial Fibrillation (Goal: prevent stroke and major bleeding) -Controlled - lone episode Afib 2015. Monitoring for recurrence.  Asthmatic bronchitis (Goal: control symptoms and prevent exacerbations) -Controlled - pt denies frequent symptoms, he uses albuterol very rarely when exercising -Hx CAP Nov 2022 tx with Zpak then Augmentin -Current treatment  Albuterol HFA prn - rare use (once this year) - Appropriate, Effective, Safe, Accessible -Pulmonary function testing: PFTs 03/2014 - moderate central airway obstructive disease - FVC 79%, FEV1 pre 73% and post 85%, ratio 0.68 -Exacerbations requiring treatment in last 6 months: 1 -Counseled on Proper inhaler technique; -Recommended to continue current medication  Insomnia (Goal: manage symptoms) -Controlled - pt reports he goes to bed 11:30pm, wakes up 4-6am; he reports he feels rested -Current treatment  Melatonin 5 mg - Appropriate, Effective, Safe, Accessible -Medications previously tried: trazodone  -Recommended to continue current medication  Health Maintenance -Vaccine gaps: none -Hx recurrent nephrolithiasis, remote -Pt is due for annual PCP visit - advised pt to make appt at his convenience  Patient Goals/Self-Care Activities Patient will:  - take medications as prescribed as evidenced by patient report and record review -focus on medication adherence by routine -check blood pressure periodically      Patient verbalizes understanding of instructions and care plan provided today and agrees to view in [place]. Active MyChart status and patient understanding of how to access instructions and care plan via MyChart confirmed with patient.    Telephone follow up  appointment with pharmacy team member scheduled for: PRN  Charlene Brooke, PharmD, BCACP Clinical Pharmacist [place] [facility] [phone]

## 2022-09-16 DIAGNOSIS — Z96611 Presence of right artificial shoulder joint: Secondary | ICD-10-CM | POA: Diagnosis not present

## 2022-10-08 NOTE — Progress Notes (Signed)
I will  Jeremy Meadows Date of Birth: 30-Jan-1946 Medical Record #[id number]  History of Present Illness: Jeremy Meadows is seen for followup of aortic stenosis and  thoracic aortic aneurysm. He is s/p AVR and aortic aneurysm grafting by Dr. [name] on 07/02/14 with a #23 Magna Ease pericardial valve and 32 mm Hema shield graft. His post op course was complicated by atrial fibrillation that converted to NSR on amiodarone. Amiodarone was later discontinued and he has had no further arrhythmia.   In Nov 2016 he had an incisional hernia repaired by Dr [name], he tolerated this well. CT of the aorta in December 2017 was stable. Repeat Echo 05/31/19 showed normal LV function and normal AV prosthetic function.  He underwent left shoulder surgery in November 2021. He had right shoulder replaced in July 2023.   Still active exercising and playing softball. No chest pain or SOB.  No palpitations or dizziness. BP at home has been consistently controlled.    Current Outpatient Medications on File Prior to Visit  Medication Sig Dispense Refill   albuterol (VENTOLIN HFA) 108 (90 Base) MCG/ACT inhaler Inhale 2 puffs into the lungs every 6 (six) hours as needed for wheezing or shortness of breath. 54 g 1   aspirin 81 MG tablet Take 81 mg by mouth daily.     cholecalciferol (VITAMIN D) 1000 UNITS tablet Take 1,000 Units by mouth daily.     dorzolamide-timolol (COSOPT) 22.3-6.8 MG/ML ophthalmic solution 1 drop 2 (two) times daily.     vitamin C (ASCORBIC ACID) 500 MG tablet Take 500 mg by mouth daily.      Current Facility-Administered Medications on File Prior to Visit  Medication Dose Route Frequency Provider Last Rate Last Admin   0.9 %  sodium chloride infusion  500 mL Intravenous Continuous [name], MD        Allergies  Allergen Reactions   Ace Inhibitors Cough    Hacking cough    Past Medical History:  Diagnosis Date   Allergic rhinitis, cause unspecified    Aortic insufficiency     s/p AVR 2015   Aortic root enlargement (HCC)    thoracic aorta   Aortic valve stenosis 2015   s/p AVR 2015 [name]) - completed cardiac rehab 08/2014   Arthritis    Asthma    Atrial fibrillation ([place]) 07/23/2014   Benign neoplasm of colon    Gallstones    by CT   History of kidney stones    History of nephrolithiasis    HLD (hyperlipidemia)    Hypertension    Need for prophylactic antibiotic    Obstructive sleep apnea (adult) (pediatric)    pt. doesn't use his machine at home.   Other testicular hypofunction    Pulmonary nodule, left 2015   2cm, ?sarcoid by biopsy   Unspecified glaucoma(365.9)     Past Surgical History:  Procedure Laterality Date   AORTIC VALVE REPLACEMENT N/A 07/02/2014   Procedure: AORTIC VALVE REPLACEMENT (AVR) with 23 Aortic Magna Ease;  Surgeon: Grace Isaac, MD   ASCENDING AORTIC ROOT REPLACEMENT N/A 07/02/2014   Procedure: supra coronary  ASCENDING AORTIC REPLACEMENT to the Inominate Artery with 24m Hemashield Platinum with  circulatory arrest;  Surgeon: EGrace Isaac MD   CARDIAC CATHETERIZATION     CARDIAC VALVE REPLACEMENT     CATARACT EXTRACTION Bilateral    COLONOSCOPY  2013   WNL, rpt 5 yrs ([name]   COLONOSCOPY  2018   1 TA, diverticulosis, rpt 5  yrs [name])   EYE SURGERY     INTRAOPERATIVE TRANSESOPHAGEAL ECHOCARDIOGRAM N/A 07/02/2014   JOINT REPLACEMENT Right 2014   partial replacement   KNEE SURGERY Right 05/2012   arthroscopic   KNEE SURGERY Right 12/2013   patella   LEFT AND RIGHT HEART CATHETERIZATION WITH CORONARY ANGIOGRAM N/A 05/22/2014   no significant obstructive CAD ([name])   LITHOTRIPSY     LUNG BIOPSY Left 2015   by IR - benign   MEDIAL PARTIAL KNEE REPLACEMENT Right 03/2013   REVERSE TOTAL SHOULDER ARTHROPLASTY Right 03/2022   Dr Elyn Peers   RHINOPLASTY  1980s   VENTRAL HERNIA REPAIR  08/26/2015   VENTRAL HERNIA REPAIR N/A 08/26/2015   Procedure: VENTRAL HERNIA REPAIR WITH MESH ADULT;   Surgeon: Grace Isaac, MD;  [facility];  Service: Vascular;  Laterality: N/A;    Social History   Tobacco Use  Smoking Status Never  Smokeless Tobacco Never    Social History   Substance and Sexual Activity  Alcohol Use No   Alcohol/week: 0.0 standard drinks of alcohol    Family History  Problem Relation Age of Onset   Cancer Mother        Jaw   CAD Father 83       MI   Heart disease Brother    Heart disease Brother    Heart disease Brother    Colon cancer Neg Hx    Colon polyps Neg Hx    Esophageal cancer Neg Hx    Rectal cancer Neg Hx    Stomach cancer Neg Hx     Review of Systems: As noted in history of present illness. All other systems were reviewed and are negative.  Physical Exam: BP (!) 136/96 (BP Location: Right Arm, Patient Position: Sitting, Cuff Size: Normal)   Pulse 77   Ht '5\' 11"'$  (1.803 m)   Wt 227 lb (103 kg)   BMI 31.66 kg/m  GENERAL:  Well appearing WM in NAD HEENT:  PERRL, EOMI, sclera are clear. Oropharynx is clear. NECK:  No jugular venous distention, carotid upstroke brisk and symmetric, no bruits, no thyromegaly or adenopathy LUNGS:  Clear to auscultation bilaterally CHEST:  Unremarkable HEART:  RRR,  PMI not displaced or sustained,S1 and S2 within normal limits, no S3, no S4: no clicks, no rubs, soft 1/6 systolic murmur RUSB ABD:  Soft, nontender. BS +, no masses or bruits. No hepatomegaly, no splenomegaly EXT:  2 + pulses throughout, no edema, no cyanosis no clubbing SKIN:  Warm and dry.  No rashes NEURO:  Alert and oriented x 3. Cranial nerves II through XII intact. PSYCH:  Cognitively intact    LABORATORY DATA: Lab Results  Component Value Date   WBC 7.4 03/02/2021   HGB 15.2 03/02/2021   HCT 43.8 03/02/2021   PLT 159.0 03/02/2021   GLUCOSE 103 (H) 03/02/2021   CHOL 113 03/02/2021   TRIG 159.0 (H) 03/02/2021   HDL 31.30 (L) 03/02/2021   LDLDIRECT 67.0 08/15/2019   LDLCALC 49 03/02/2021   ALT 19 03/02/2021   AST  25 03/02/2021   NA 140 03/02/2021   K 4.5 03/02/2021   CL 105 03/02/2021   CREATININE 1.10 03/02/2021   BUN 24 (H) 03/02/2021   CO2 27 03/02/2021   TSH 2.09 08/15/2019   PSA 0.88 03/02/2021   INR 0.99 08/14/2015   HGBA1C 5.8 03/02/2021   MICROALBUR 1.3 03/02/2021    Ecg today shows NSR with rate 77. RBBB occ PVC. I  personally reviewed and interpreted this study.  Echo: 08/02/14: Study Conclusions  - Left ventricle: The cavity size was normal. Wall thickness was   normal. Systolic function was normal. The estimated ejection   fraction was in the range of 60% to 65%. Features are consistent   with a pseudonormal left ventricular filling pattern, with   concomitant abnormal relaxation and increased filling pressure   (grade 2 diastolic dysfunction). - Aortic valve: AV valve prosthesis opens well Peak and mean   gradients through the valve are 17 and 10 mm Hg respectively. - Mitral valve: There was mild regurgitation. - Left atrium: The atrium was moderately dilated.   CT CHEST WITHOUT CONTRAST   TECHNIQUE: Multidetector CT imaging of the chest was performed following the standard protocol without IV contrast.   COMPARISON:  02/20/2015   FINDINGS: Cardiovascular: No acute findings. Previous aortic valve replacement. Stable appearance of the ascending aortic graft. Distal ascending aorta along the distal aspect of the graft measures 4.5 cm in maximum diameter, which remains stable. No evidence of mediastinal hematoma.   Aortic and coronary artery atherosclerotic calcification noted. Normal heart size. No evidence pericardial effusion.   Mediastinum/Nodes: No masses or pathologically enlarged lymph nodes identified on this unenhanced exam.   Lungs/Pleura: No pulmonary infiltrate or mass identified. Mild scarring noted posterior left lower lobe. No effusion present.   Upper Abdomen:  Small calcified gallstones incidentally noted.   Musculoskeletal:  No suspicious bone  lesions.   IMPRESSION: Previous aortic valve replacement. Stable appearance of ascending aortic graft. Stable mild dilatation of distal ascending aorta along the distal aspect of the graft, measuring 4.5 cm in maximum diameter.   No acute findings.   Incidentally noted aortic and coronary atherosclerosis and cholelithiasis.     Electronically Signed   By: Earle Gell M.D.   On: 09/15/2016 14:38  Echo 05/31/19: IMPRESSIONS      1. The left ventricle has normal systolic function, with an ejection fraction of 55-60%. The cavity size was normal. Left ventricular diastolic parameters were normal. Indeterminate filling pressures No evidence of left ventricular regional wall motion  abnormalities.  2. The right ventricle has normal systolic function. The cavity was normal. There is no increase in right ventricular wall thickness.  3. The mitral valve is abnormal. Mild thickening of the mitral valve leaflet.  4. The tricuspid valve is grossly normal.  5. A 80m Magna bioprosthesis valve is present in the aortic position. Normal aortic valve prosthesis.  6. Prosthetic aortic graft in the ascending aorta position.  7. The aorta is normal unless otherwise noted.  8. The inferior vena cava was normal in size with <50% respiratory variability.  9. When compared to the prior study: 08/02/14 EF 60-65%. AV 120mg max, 1060m mean.  Assessment / Plan: 1. Aortic stenosis/bicuspid AV -  S/p pericardial tissue valve replacement in October 2015. Doing well. Echo post op showed good valve function. Repeat Echo September 2020 showed normal prosthetic function. SBE prophylaxis. Exam today is good. Plan to repeat Echo at 5 years as long as clinically stable.   2. Thoracic aortic aneurysm. S/p grafting. CT showed good repair with stable follow up in December 2017.   3. Hypertension-reports it is well controlled on current medication. Rx refilled  4.PVCs asymptomatic. Noted intermittently in past. Normal LV  function. Continue metoprolol.  5. RBBB new since 2021. No symptoms. Benign.

## 2022-10-15 ENCOUNTER — Ambulatory Visit: Payer: Medicare HMO | Attending: Cardiology | Admitting: Cardiology

## 2022-10-15 ENCOUNTER — Encounter: Payer: Self-pay | Admitting: Cardiology

## 2022-10-15 VITALS — BP 136/96 | HR 77 | Ht 71.0 in | Wt 227.0 lb

## 2022-10-15 DIAGNOSIS — Z952 Presence of prosthetic heart valve: Secondary | ICD-10-CM | POA: Diagnosis not present

## 2022-10-15 DIAGNOSIS — I7121 Aneurysm of the ascending aorta, without rupture: Secondary | ICD-10-CM

## 2022-10-15 DIAGNOSIS — I10 Essential (primary) hypertension: Secondary | ICD-10-CM

## 2022-10-15 MED ORDER — METOPROLOL TARTRATE 50 MG PO TABS: 50.0000 mg | ORAL_TABLET | Freq: Two times a day (BID) | ORAL | 3 refills | Status: DC

## 2022-10-15 MED ORDER — AMLODIPINE BESYLATE 2.5 MG PO TABS: 2.5000 mg | ORAL_TABLET | Freq: Every day | ORAL | 3 refills | Status: DC

## 2022-10-15 MED ORDER — ATORVASTATIN CALCIUM 10 MG PO TABS: ORAL_TABLET | ORAL | 3 refills | Status: DC

## 2022-11-10 DIAGNOSIS — M4306 Spondylolysis, lumbar region: Secondary | ICD-10-CM | POA: Diagnosis not present

## 2022-11-10 DIAGNOSIS — M5416 Radiculopathy, lumbar region: Secondary | ICD-10-CM | POA: Diagnosis not present

## 2022-11-10 DIAGNOSIS — M9905 Segmental and somatic dysfunction of pelvic region: Secondary | ICD-10-CM | POA: Diagnosis not present

## 2022-11-10 DIAGNOSIS — M9903 Segmental and somatic dysfunction of lumbar region: Secondary | ICD-10-CM | POA: Diagnosis not present

## 2022-11-11 DIAGNOSIS — M9903 Segmental and somatic dysfunction of lumbar region: Secondary | ICD-10-CM | POA: Diagnosis not present

## 2022-11-11 DIAGNOSIS — M9905 Segmental and somatic dysfunction of pelvic region: Secondary | ICD-10-CM | POA: Diagnosis not present

## 2022-11-11 DIAGNOSIS — M4306 Spondylolysis, lumbar region: Secondary | ICD-10-CM | POA: Diagnosis not present

## 2022-11-11 DIAGNOSIS — M5416 Radiculopathy, lumbar region: Secondary | ICD-10-CM | POA: Diagnosis not present

## 2022-11-12 DIAGNOSIS — M4306 Spondylolysis, lumbar region: Secondary | ICD-10-CM | POA: Diagnosis not present

## 2022-11-12 DIAGNOSIS — M5416 Radiculopathy, lumbar region: Secondary | ICD-10-CM | POA: Diagnosis not present

## 2022-11-12 DIAGNOSIS — M9903 Segmental and somatic dysfunction of lumbar region: Secondary | ICD-10-CM | POA: Diagnosis not present

## 2022-11-12 DIAGNOSIS — M9905 Segmental and somatic dysfunction of pelvic region: Secondary | ICD-10-CM | POA: Diagnosis not present

## 2022-11-15 DIAGNOSIS — M4306 Spondylolysis, lumbar region: Secondary | ICD-10-CM | POA: Diagnosis not present

## 2022-11-15 DIAGNOSIS — M5416 Radiculopathy, lumbar region: Secondary | ICD-10-CM | POA: Diagnosis not present

## 2022-11-15 DIAGNOSIS — M9903 Segmental and somatic dysfunction of lumbar region: Secondary | ICD-10-CM | POA: Diagnosis not present

## 2022-11-15 DIAGNOSIS — M9905 Segmental and somatic dysfunction of pelvic region: Secondary | ICD-10-CM | POA: Diagnosis not present

## 2022-11-17 DIAGNOSIS — M9903 Segmental and somatic dysfunction of lumbar region: Secondary | ICD-10-CM | POA: Diagnosis not present

## 2022-11-17 DIAGNOSIS — M5416 Radiculopathy, lumbar region: Secondary | ICD-10-CM | POA: Diagnosis not present

## 2022-11-17 DIAGNOSIS — M4306 Spondylolysis, lumbar region: Secondary | ICD-10-CM | POA: Diagnosis not present

## 2022-11-17 DIAGNOSIS — M9905 Segmental and somatic dysfunction of pelvic region: Secondary | ICD-10-CM | POA: Diagnosis not present

## 2022-11-18 DIAGNOSIS — M4306 Spondylolysis, lumbar region: Secondary | ICD-10-CM | POA: Diagnosis not present

## 2022-11-18 DIAGNOSIS — M5416 Radiculopathy, lumbar region: Secondary | ICD-10-CM | POA: Diagnosis not present

## 2022-11-18 DIAGNOSIS — M9903 Segmental and somatic dysfunction of lumbar region: Secondary | ICD-10-CM | POA: Diagnosis not present

## 2022-11-18 DIAGNOSIS — M9905 Segmental and somatic dysfunction of pelvic region: Secondary | ICD-10-CM | POA: Diagnosis not present

## 2022-11-22 DIAGNOSIS — M9905 Segmental and somatic dysfunction of pelvic region: Secondary | ICD-10-CM | POA: Diagnosis not present

## 2022-11-22 DIAGNOSIS — M5416 Radiculopathy, lumbar region: Secondary | ICD-10-CM | POA: Diagnosis not present

## 2022-11-22 DIAGNOSIS — M4306 Spondylolysis, lumbar region: Secondary | ICD-10-CM | POA: Diagnosis not present

## 2022-11-22 DIAGNOSIS — M9903 Segmental and somatic dysfunction of lumbar region: Secondary | ICD-10-CM | POA: Diagnosis not present

## 2022-11-24 DIAGNOSIS — M9905 Segmental and somatic dysfunction of pelvic region: Secondary | ICD-10-CM | POA: Diagnosis not present

## 2022-11-24 DIAGNOSIS — M4306 Spondylolysis, lumbar region: Secondary | ICD-10-CM | POA: Diagnosis not present

## 2022-11-24 DIAGNOSIS — M9903 Segmental and somatic dysfunction of lumbar region: Secondary | ICD-10-CM | POA: Diagnosis not present

## 2022-11-24 DIAGNOSIS — M5416 Radiculopathy, lumbar region: Secondary | ICD-10-CM | POA: Diagnosis not present

## 2022-11-25 DIAGNOSIS — M9903 Segmental and somatic dysfunction of lumbar region: Secondary | ICD-10-CM | POA: Diagnosis not present

## 2022-11-25 DIAGNOSIS — M5416 Radiculopathy, lumbar region: Secondary | ICD-10-CM | POA: Diagnosis not present

## 2022-11-25 DIAGNOSIS — M9905 Segmental and somatic dysfunction of pelvic region: Secondary | ICD-10-CM | POA: Diagnosis not present

## 2022-11-25 DIAGNOSIS — M4306 Spondylolysis, lumbar region: Secondary | ICD-10-CM | POA: Diagnosis not present

## 2022-11-30 DIAGNOSIS — M9903 Segmental and somatic dysfunction of lumbar region: Secondary | ICD-10-CM | POA: Diagnosis not present

## 2022-11-30 DIAGNOSIS — M9905 Segmental and somatic dysfunction of pelvic region: Secondary | ICD-10-CM | POA: Diagnosis not present

## 2022-11-30 DIAGNOSIS — M4306 Spondylolysis, lumbar region: Secondary | ICD-10-CM | POA: Diagnosis not present

## 2022-11-30 DIAGNOSIS — M5416 Radiculopathy, lumbar region: Secondary | ICD-10-CM | POA: Diagnosis not present

## 2022-12-02 DIAGNOSIS — M5416 Radiculopathy, lumbar region: Secondary | ICD-10-CM | POA: Diagnosis not present

## 2022-12-02 DIAGNOSIS — M4306 Spondylolysis, lumbar region: Secondary | ICD-10-CM | POA: Diagnosis not present

## 2022-12-02 DIAGNOSIS — M9903 Segmental and somatic dysfunction of lumbar region: Secondary | ICD-10-CM | POA: Diagnosis not present

## 2022-12-02 DIAGNOSIS — M9905 Segmental and somatic dysfunction of pelvic region: Secondary | ICD-10-CM | POA: Diagnosis not present

## 2022-12-07 DIAGNOSIS — M9905 Segmental and somatic dysfunction of pelvic region: Secondary | ICD-10-CM | POA: Diagnosis not present

## 2022-12-07 DIAGNOSIS — M9903 Segmental and somatic dysfunction of lumbar region: Secondary | ICD-10-CM | POA: Diagnosis not present

## 2022-12-07 DIAGNOSIS — M4306 Spondylolysis, lumbar region: Secondary | ICD-10-CM | POA: Diagnosis not present

## 2022-12-07 DIAGNOSIS — M5416 Radiculopathy, lumbar region: Secondary | ICD-10-CM | POA: Diagnosis not present

## 2022-12-08 DIAGNOSIS — H401131 Primary open-angle glaucoma, bilateral, mild stage: Secondary | ICD-10-CM | POA: Diagnosis not present

## 2022-12-09 DIAGNOSIS — M9905 Segmental and somatic dysfunction of pelvic region: Secondary | ICD-10-CM | POA: Diagnosis not present

## 2022-12-09 DIAGNOSIS — M5416 Radiculopathy, lumbar region: Secondary | ICD-10-CM | POA: Diagnosis not present

## 2022-12-09 DIAGNOSIS — M9903 Segmental and somatic dysfunction of lumbar region: Secondary | ICD-10-CM | POA: Diagnosis not present

## 2022-12-09 DIAGNOSIS — M4306 Spondylolysis, lumbar region: Secondary | ICD-10-CM | POA: Diagnosis not present

## 2022-12-15 DIAGNOSIS — M5416 Radiculopathy, lumbar region: Secondary | ICD-10-CM | POA: Diagnosis not present

## 2022-12-15 DIAGNOSIS — M9905 Segmental and somatic dysfunction of pelvic region: Secondary | ICD-10-CM | POA: Diagnosis not present

## 2022-12-15 DIAGNOSIS — M4306 Spondylolysis, lumbar region: Secondary | ICD-10-CM | POA: Diagnosis not present

## 2022-12-15 DIAGNOSIS — M9903 Segmental and somatic dysfunction of lumbar region: Secondary | ICD-10-CM | POA: Diagnosis not present

## 2022-12-22 DIAGNOSIS — M4306 Spondylolysis, lumbar region: Secondary | ICD-10-CM | POA: Diagnosis not present

## 2022-12-22 DIAGNOSIS — M9903 Segmental and somatic dysfunction of lumbar region: Secondary | ICD-10-CM | POA: Diagnosis not present

## 2022-12-22 DIAGNOSIS — M9905 Segmental and somatic dysfunction of pelvic region: Secondary | ICD-10-CM | POA: Diagnosis not present

## 2022-12-22 DIAGNOSIS — M5416 Radiculopathy, lumbar region: Secondary | ICD-10-CM | POA: Diagnosis not present

## 2023-03-07 ENCOUNTER — Telehealth: Payer: Self-pay

## 2023-03-07 NOTE — Progress Notes (Cosign Needed)
Care Management & Coordination Services Pharmacy Team  Reason for Encounter: General Adherence Update   Contacted patient for general health update and medication adherence call.  Spoke with patient on 03/07/2023   What concerns do you have about your medications? Patient stated he did not have any medication or health concerns and everything is going well.   The patient denies side effects with their medications.   How often do you forget or accidentally miss a dose? Rarely  Do you use a pillbox? Yes  Are you having any problems getting your medications from your pharmacy? No  Has the cost of your medications been a concern? No  Since last visit with PharmD, no interventions have been made.   The patient has not had an ED visit since last contact.   The patient denies problems with their health.   Patient denies concerns or questions for Al Corpus, PharmD at this time.   Chart Updates:  Recent office visits:  None since last contact  Recent consult visits:  11/10/22 - 12/22/22 Josefina Do (Chiropractor) Segmental and somatic dysfunction of lumbar region No other information 10/15/22 Peter [place], MD (Cardiology) Aortic Valve Replacement Ordered: EKG  09/16/22 Tama High (Ortho) - PT No other information  Hospital visits:  None in previous 6 months  Medications: Outpatient Encounter Medications as of 03/07/2023  Medication Sig   albuterol (VENTOLIN HFA) 108 (90 Base) MCG/ACT inhaler Inhale 2 puffs into the lungs every 6 (six) hours as needed for wheezing or shortness of breath.   amLODipine (NORVASC) 2.5 MG tablet Take 1 tablet (2.5 mg total) by mouth daily.   aspirin 81 MG tablet Take 81 mg by mouth daily.   atorvastatin (LIPITOR) 10 MG tablet TAKE 1 TABLET EVERY DAY AT 6PM   cholecalciferol (VITAMIN D) 1000 UNITS tablet Take 1,000 Units by mouth daily.   dorzolamide-timolol (COSOPT) 22.3-6.8 MG/ML ophthalmic solution 1 drop 2 (two) times daily.    metoprolol tartrate (LOPRESSOR) 50 MG tablet Take 1 tablet (50 mg total) by mouth 2 (two) times daily.   vitamin C (ASCORBIC ACID) 500 MG tablet Take 500 mg by mouth daily.    Facility-Administered Encounter Medications as of 03/07/2023  Medication   0.9 %  sodium chloride infusion    Recent vitals BP Readings from Last 3 Encounters:  10/15/22 (!) 136/96  09/06/22 122/82  09/14/21 (!) 130/100   Pulse Readings from Last 3 Encounters:  10/15/22 77  09/14/21 68  08/06/21 69   Wt Readings from Last 3 Encounters:  10/15/22 227 lb (103 kg)  03/16/22 220 lb (99.8 kg)  09/14/21 112 lb 12.8 oz (51.2 kg)   BMI Readings from Last 3 Encounters:  10/15/22 31.66 kg/m  03/16/22 30.68 kg/m  09/14/21 15.51 kg/m    Recent lab results    Component Value Date/Time   NA 140 03/02/2021 0828   NA 143 05/30/2018 1559   NA 141 01/15/2014 1904   K 4.5 03/02/2021 0828   K 4.2 01/15/2014 1904   CL 105 03/02/2021 0828   CL 106 01/15/2014 1904   CO2 27 03/02/2021 0828   CO2 29 01/15/2014 1904   GLUCOSE 103 (H) 03/02/2021 0828   GLUCOSE 93 01/15/2014 1904   BUN 24 (H) 03/02/2021 0828   BUN 24 05/30/2018 1559   BUN 31 (H) 01/15/2014 1904   CREATININE 1.10 03/02/2021 0828   CREATININE 1.19 05/20/2014 0927   CALCIUM 9.5 03/02/2021 0828   CALCIUM 9.1 01/15/2014 1904    Lab  Results  Component Value Date   CREATININE 1.10 03/02/2021   GFR 65.74 03/02/2021   GFRNONAA 52 (L) 05/30/2018   GFRAA 60 05/30/2018   Lab Results  Component Value Date/Time   HGBA1C 5.8 03/02/2021 08:28 AM   HGBA1C 5.6 08/15/2019 09:35 AM   MICROALBUR 1.3 03/02/2021 08:28 AM   MICROALBUR 2.1 (H) 08/15/2019 09:36 AM    Lab Results  Component Value Date   CHOL 113 03/02/2021   HDL 31.30 (L) 03/02/2021   LDLCALC 49 03/02/2021   LDLDIRECT 67.0 08/15/2019   TRIG 159.0 (H) 03/02/2021   CHOLHDL 4 03/02/2021    Care Gaps:  Annual wellness visit in last year? Yes 03/16/2022  Star Rating Drugs:   Medication:  Last Fill: Day Supply Atorvastatin 10 mg 12/27/2022 90  Al Corpus, PharmD notified  Claudina Lick, [place] Clinical Pharmacy Assistant [phone]

## 2023-05-06 ENCOUNTER — Encounter: Payer: Self-pay | Admitting: Family Medicine

## 2023-05-09 ENCOUNTER — Ambulatory Visit (INDEPENDENT_AMBULATORY_CARE_PROVIDER_SITE_OTHER): Payer: Medicare HMO

## 2023-05-09 VITALS — BP 118/79 | Ht 71.0 in | Wt 228.0 lb

## 2023-05-09 DIAGNOSIS — H9193 Unspecified hearing loss, bilateral: Secondary | ICD-10-CM | POA: Diagnosis not present

## 2023-05-09 DIAGNOSIS — Z0000 Encounter for general adult medical examination without abnormal findings: Secondary | ICD-10-CM

## 2023-05-09 NOTE — Progress Notes (Signed)
 Because this visit was a virtual/telehealth visit,  certain criteria was not obtained, such a blood pressure, CBG if patient is a diabetic, and timed up and go. Any medications not marked as "taking" was not mentioned during the medication reconciliation part of the visit. Any vitals not documented were not able to be obtained due to this being a telehealth visit. Vitals documented are verbally provided by the patient.   Subjective:   Jeremy Meadows is a 77 y.o. male who presents for Medicare Annual/Subsequent preventive examination.  Visit Complete: Virtual  I connected with  Jeremy Meadows on 05/09/23 by a audio enabled telemedicine application and verified that I am speaking with the correct person using two identifiers.  Patient Location: Home  Provider Location: Home Office  I discussed the limitations of evaluation and management by telemedicine. The patient expressed understanding and agreed to proceed.  Patient Medicare AWV questionnaire was completed by the patient on 05/06/2023; I have confirmed that all information answered by patient is correct and no changes since this date.  Review of Systems     Cardiac Risk Factors include: advanced age (>15men, >26 women);dyslipidemia;hypertension;obesity (BMI >30kg/m2);male gender     Objective:    Today's Vitals   05/09/23 0804  BP: 118/79  Weight: 228 lb (103.4 kg)  Height: 5\' 11"  (1.803 m)   Body mass index is 31.8 kg/m.     05/09/2023    8:07 AM 03/16/2022   10:30 AM 08/15/2019   11:16 AM 09/15/2018    8:16 AM 07/24/2018   11:21 AM 03/08/2017    9:13 AM 08/12/2016   11:44 AM  Advanced Directives  Does Patient Have a Medical Advance Directive? Yes Yes Yes Yes Yes Yes Yes  Type of Estate agent of [place];Living will Healthcare Power of [place];Living will Healthcare Power of [place];Living will  Healthcare Power of [place];Living will Living will;Healthcare Power of [employer]  Power of [place];Living will  Does patient want to make changes to medical advance directive? No - Patient declined      No - Patient declined  Copy of Healthcare Power of Attorney in Chart? Yes - validated most recent copy scanned in chart (See row information) Yes - validated most recent copy scanned in chart (See row information) Yes - validated most recent copy scanned in chart (See row information)  No - copy requested  Yes    Current Medications (verified) Outpatient Encounter Medications as of 05/09/2023  Medication Sig   albuterol (VENTOLIN HFA) 108 (90 Base) MCG/ACT inhaler Inhale 2 puffs into the lungs every 6 (six) hours as needed for wheezing or shortness of breath.   amLODipine (NORVASC) 2.5 MG tablet Take 1 tablet (2.5 mg total) by mouth daily.   aspirin 81 MG tablet Take 81 mg by mouth daily.   atorvastatin (LIPITOR) 10 MG tablet TAKE 1 TABLET EVERY DAY AT 6PM   cholecalciferol (VITAMIN D) 1000 UNITS tablet Take 1,000 Units by mouth daily.   dorzolamide-timolol (COSOPT) 22.3-6.8 MG/ML ophthalmic solution 1 drop 2 (two) times daily.   metoprolol tartrate (LOPRESSOR) 50 MG tablet Take 1 tablet (50 mg total) by mouth 2 (two) times daily.   vitamin C (ASCORBIC ACID) 500 MG tablet Take 500 mg by mouth daily.    Facility-Administered Encounter Medications as of 05/09/2023  Medication   0.9 %  sodium chloride infusion    Allergies (verified) Ace inhibitors   History: Past Medical History:  Diagnosis Date   Allergic rhinitis, cause unspecified  Aortic insufficiency    s/p AVR 2015   Aortic root enlargement (HCC)    thoracic aorta   Aortic valve stenosis 2015   s/p AVR 2015 Tyrone Sage) - completed cardiac rehab 08/2014   Arthritis    Asthma    Atrial fibrillation (HCC) 07/23/2014   Benign neoplasm of colon    Gallstones    by CT   History of kidney stones    History of nephrolithiasis    HLD (hyperlipidemia)    Hypertension    Need for prophylactic antibiotic     Obstructive sleep apnea (adult) (pediatric)    pt. doesn't use his machine at home.   Other testicular hypofunction    Pulmonary nodule, left 2015   2cm, ?sarcoid by biopsy   Unspecified glaucoma(365.9)    Past Surgical History:  Procedure Laterality Date   AORTIC VALVE REPLACEMENT N/A 07/02/2014   Procedure: AORTIC VALVE REPLACEMENT (AVR) with 23 Aortic Magna Ease;  Surgeon: Delight Ovens, MD   ASCENDING AORTIC ROOT REPLACEMENT N/A 07/02/2014   Procedure: supra coronary  ASCENDING AORTIC REPLACEMENT to the Inominate Artery with 32mm Hemashield Platinum with  circulatory arrest;  Surgeon: Delight Ovens, MD   CARDIAC CATHETERIZATION     CARDIAC VALVE REPLACEMENT     CATARACT EXTRACTION Bilateral    COLONOSCOPY  2013   WNL, rpt 5 yrs Russella Dar)   COLONOSCOPY  2018   1 TA, diverticulosis, rpt 5 yrs Russella Dar)   EYE SURGERY     INTRAOPERATIVE TRANSESOPHAGEAL ECHOCARDIOGRAM N/A 07/02/2014   JOINT REPLACEMENT Right 2014   partial replacement   KNEE SURGERY Right 05/2012   arthroscopic   KNEE SURGERY Right 12/2013   patella   LEFT AND RIGHT HEART CATHETERIZATION WITH CORONARY ANGIOGRAM N/A 05/22/2014   no significant obstructive CAD ([place])   LITHOTRIPSY     LUNG BIOPSY Left 2015   by IR - benign   MEDIAL PARTIAL KNEE REPLACEMENT Right 03/2013   REVERSE TOTAL SHOULDER ARTHROPLASTY Right 03/2022   Dr Consuello Bossier   RHINOPLASTY  1980s   VENTRAL HERNIA REPAIR  08/26/2015   VENTRAL HERNIA REPAIR N/A 08/26/2015   Procedure: VENTRAL HERNIA REPAIR WITH MESH ADULT;  Surgeon: Delight Ovens, MD;  Location: [facility] OR;  Service: Vascular;  Laterality: N/A;   Family History  Problem Relation Age of Onset   Cancer Mother        Jaw   CAD Father 15       MI   Heart disease Brother    Heart disease Brother    Heart disease Brother    Colon cancer Neg Hx    Colon polyps Neg Hx    Esophageal cancer Neg Hx    Rectal cancer Neg Hx    Stomach cancer Neg Hx    Social History    Socioeconomic History   Marital status: Married    Spouse name: Not on file   Number of children: 2   Years of education: Not on file   Highest education level: Not on file  Occupational History   Occupation: retired  Tobacco Use   Smoking status: Never   Smokeless tobacco: Never  Vaping Use   Vaping status: Never Used  Substance and Sexual Activity   Alcohol use: No    Alcohol/week: 0.0 standard drinks of alcohol   Drug use: No   Sexual activity: Yes  Other Topics Concern   Not on file  Social History Narrative   Lives with wife Chyrl Civatte  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   Activity: softball, walking   Diet: good water, fruits/vegetables daily   Social Determinants of Health   Financial Resource Strain: Low Risk  (05/09/2023)   Overall Financial Resource Strain (CARDIA)    Difficulty of Paying Living Expenses: Not hard at all  Food Insecurity: No Food Insecurity (05/09/2023)   Hunger Vital Sign    Worried About Running Out of Food in the Last Year: Never true    Ran Out of Food in the Last Year: Never true  Transportation Needs: No Transportation Needs (05/09/2023)   PRAPARE - Administrator, Civil Service (Medical): No    Lack of Transportation (Non-Medical): No  Physical Activity: Sufficiently Active (05/09/2023)   Exercise Vital Sign    Days of Exercise per Week: 7 days    Minutes of Exercise per Session: 30 min  Stress: No Stress Concern Present (05/09/2023)   [employer] of Occupational Health - Occupational Stress Questionnaire    Feeling of Stress : Not at all  Social Connections: Socially Isolated (05/09/2023)   Social Connection and Isolation Panel [NHANES]    Frequency of Communication with Friends and Family: Once a week    Frequency of Social Gatherings with Friends and Family: Once a week    Attends Religious Services: Never    Database administrator or Organizations: No    Attends  Engineer, structural: Never    Marital Status: Married    Tobacco Counseling Counseling given: Yes   Clinical Intake:  Pre-visit preparation completed: Yes  Pain : No/denies pain     BMI - recorded: 31.8 Nutritional Status: BMI > 30  Obese Nutritional Risks: None Diabetes: No  How often do you need to have someone help you when you read instructions, pamphlets, or other written materials from your doctor or pharmacy?: 1 - Never  Interpreter Needed?: No  Information entered by ::  Kala Gassmann, CMA   Activities of Daily Living    05/09/2023    8:05 AM 05/06/2023   11:08 AM  In your present state of health, do you have any difficulty performing the following activities:  Hearing? 1 1  Vision? 0 0  Difficulty concentrating or making decisions? 0 0  Walking or climbing stairs? 0 0  Dressing or bathing? 0 0  Doing errands, shopping? 0 0  Preparing Food and eating ? N N  Using the Toilet? N N  In the past six months, have you accidently leaked urine? N N  Do you have problems with loss of bowel control? N N  Managing your Medications? N N  Managing your Finances? N N  Housekeeping or managing your Housekeeping? N N    Patient Care Team: Eustaquio Boyden, MD as PCP - General (Family Medicine) [place], Peter M, MD as PCP - Cardiology (Cardiology) [place], Peter M, MD as Consulting Physician (Cardiology) Delight Ovens, MD (Inactive) as Consulting Physician (Cardiothoracic Surgery) Kathyrn Sheriff, [facility] (Inactive) as Pharmacist (Pharmacist)  Indicate any recent Medical Services you may have received from other than [facility] providers in the past year (date may be approximate).     Assessment:   This is a routine wellness examination for Jeremy Meadows.  Hearing/Vision screen Hearing Screening - Comments:: Patient states he does have difficulty hearing. He has ringing in the ears and has had a hearing test previously. He is in agreement with me placing a referral  to ENT today  Dietary issues and exercise activities discussed:     Goals Addressed             This Visit's Progress    Weight (lb) < 200 lb (90.7 kg)   228 lb (103.4 kg)    "Lose weight"       Depression Screen    05/09/2023    8:11 AM 03/16/2022   10:31 AM 03/09/2021    2:07 PM 08/15/2019   11:17 AM 07/24/2018   11:20 AM 08/12/2016   11:44 AM 08/12/2015    8:36 AM  PHQ 2/9 Scores  PHQ - 2 Score 0 0 0 0 0 0 0  PHQ- 9 Score    0 0      Fall Risk    05/09/2023    8:09 AM 05/06/2023   11:08 AM 04/15/2023    4:59 PM 03/16/2022   10:30 AM 03/15/2022    2:16 PM  Fall Risk   Falls in the past year? 0 0 0 0 0  Number falls in past yr: 0   0   Injury with Fall? 0   0 0  Risk for fall due to : No Fall Risks   Medication side effect   Follow up Falls prevention discussed   Falls evaluation completed;Education provided;Falls prevention discussed     MEDICARE RISK AT HOME:  Medicare Risk at Home - 05/09/23 0809     Any stairs in or around the home? No    If so, are there any without handrails? No    Home free of loose throw rugs in walkways, pet beds, electrical cords, etc? Yes    Adequate lighting in your home to reduce risk of falls? Yes    Life alert? No    Use of a cane, walker or w/c? No    Grab bars in the bathroom? Yes    Shower chair or bench in shower? Yes    Elevated toilet seat or a handicapped toilet? Yes             TIMED UP AND GO:  Was the test performed?  No    Cognitive Function:    08/15/2019   11:18 AM 07/24/2018   11:21 AM 08/12/2016   12:15 PM  MMSE - Mini Mental State Exam  Orientation to time 5 5 5   Orientation to Place 5 5 5   Registration 3 3 3   Attention/ Calculation 5 0 0  Recall 3 3 3   Language- name 2 objects  0 0  Language- repeat 1 1 1   Language- follow 3 step command  3 3  Language- read & follow direction  0 0  Write a sentence  0 0  Copy design  0 0  Total score  20 20        05/09/2023    8:09 AM 03/16/2022    10:31 AM  6CIT Screen  What Year? 0 points 0 points  What month? 0 points 0 points  What time? 0 points 0 points  Count back from 20 0 points 0 points  Months in reverse 0 points 0 points  Repeat phrase 0 points 0 points  Total Score 0 points 0 points    Immunizations Immunization History  Administered Date(s) Administered   COVID-19, mRNA, vaccine(Comirnaty)12 years and older 06/18/2022   Fluad Quad(high Dose 65+) 05/29/2019, 07/02/2020, 07/07/2021   Influenza Split 09/07/2011, 07/04/2012   Influenza Whole 06/27/2010   Influenza, High Dose Seasonal PF  06/18/2022   Influenza,inj,Quad PF,6+ Mos 07/03/2013, 06/18/2014, 07/10/2015, 07/28/2016, 07/01/2017, 06/22/2018   Influenza-Unspecified 07/03/2013, 06/18/2014, 07/10/2015, 07/28/2016, 07/01/2017, 06/22/2018   PFIZER Comirnaty(Gray Top)Covid-19 Tri-Sucrose Vaccine 02/10/2021   PFIZER(Purple Top)SARS-COV-2 Vaccination 11/09/2019, 12/05/2019, 08/01/2020   Pfizer Covid-19 Vaccine Bivalent Booster 37yrs & up 07/07/2021   Pneumococcal Conjugate-13 08/05/2014   Pneumococcal Polysaccharide-23 06/27/2010, 12/02/2011   Tdap 12/02/2011   Zoster Recombinant(Shingrix) 10/05/2021, 01/19/2022   Zoster, Live 09/12/2014    TDAP status: Due, Education has been provided regarding the importance of this vaccine. Advised may receive this vaccine at local pharmacy or Health Dept. Aware to provide a copy of the vaccination record if obtained from local pharmacy or Health Dept. Verbalized acceptance and understanding.  Flu Vaccine status: Due, Education has been provided regarding the importance of this vaccine. Advised may receive this vaccine at local pharmacy or Health Dept. Aware to provide a copy of the vaccination record if obtained from local pharmacy or Health Dept. Verbalized acceptance and understanding.  Pneumococcal vaccine status: Up to date  Covid-19 vaccine status: Information provided on how to obtain vaccines.   Qualifies for Shingles  Vaccine? Yes   Zostavax completed No   Shingrix Completed?: Yes  Screening Tests Health Maintenance  Topic Date Due   DTaP/Tdap/Td (2 - Td or Tdap) 12/01/2021   Colonoscopy  03/08/2022   COVID-19 Vaccine (7 - 2023-24 season) 08/13/2022   Medicare Annual Wellness (AWV)  03/17/2023   INFLUENZA VACCINE  04/28/2023   Pneumonia Vaccine 41+ Years old  Completed   Hepatitis C Screening  Completed   Zoster Vaccines- Shingrix  Completed   HPV VACCINES  Aged Out    Health Maintenance  Health Maintenance Due  Topic Date Due   DTaP/Tdap/Td (2 - Td or Tdap) 12/01/2021   Colonoscopy  03/08/2022   COVID-19 Vaccine (7 - 2023-24 season) 08/13/2022   Medicare Annual Wellness (AWV)  03/17/2023   INFLUENZA VACCINE  04/28/2023    Colorectal Cancer Screening: Patient will call and schedule his appt. He recently had a total shoulder replacement and didn't want to go under anesthesia again so soon afterwards  Lung Cancer Screening: (Low Dose CT Chest recommended if Age 87-80 years, 20 pack-year currently smoking OR have quit w/in 15years.) does not qualify.   Additional Screening:  Hepatitis C Screening: does not qualify; Completed 08/12/2016  Vision Screening: Recommended annual ophthalmology exams for early detection of glaucoma and other disorders of the eye. Is the patient up to date with their annual eye exam?  Yes  Who is the provider or what is the name of the office in which the patient attends annual eye exams? [facility]   Dental Screening: Recommended annual dental exams for proper oral hygiene  Diabetic Foot Exam: n/a  Community Resource Referral / Chronic Care Management: CRR required this visit?  No   CCM required this visit?  No     Plan:     I have personally reviewed and noted the following in the patient's chart:   Medical and social history Use of alcohol, tobacco or illicit drugs  Current medications and supplements including opioid prescriptions. Patient is not  currently taking opioid prescriptions. Functional ability and status Nutritional status Physical activity Advanced directives List of other physicians Hospitalizations, surgeries, and ER visits in previous 12 months Vitals Screenings to include cognitive, depression, and falls Referrals and appointments  In addition, I have reviewed and discussed with patient certain preventive protocols, quality metrics, and best practice recommendations. A written personalized care  plan for preventive services as well as general preventive health recommendations were provided to patient.     Jordan Hawks Kyen Taite, CMA   05/09/2023   After Visit Summary: (MyChart) Due to this being a telephonic visit, the after visit summary with patients personalized plan was offered to patient via MyChart   Nurse Notes: Very pleasant patient. ENT referral placed due to hearing difficulties. Patient in agreement with treatment plan.

## 2023-05-09 NOTE — Patient Instructions (Signed)
[name] , Thank you for taking time to come for your Medicare Wellness Visit. I appreciate your ongoing commitment to your health goals. Please review the following plan we discussed and let me know if I can assist you in the future.   These are the goals we discussed:  Goals      Increase physical activity     Starting 07/24/2018, I will continue to exercise at least 45 min 3 days per week.      Manage My Medicine     Timeframe:  Long-Range Goal Priority:  Medium Start Date:   09/07/21                          Expected End Date: 09/07/22                       Follow Up Date Dec 2023   - call for medicine refill 2 or 3 days before it runs out - call if I am sick and can't take my medicine - keep a list of all the medicines I take; vitamins and herbals too - use a pillbox to sort medicine    Why is this important?   These steps will help you keep on track with your medicines.   Notes:      Patient Stated     08/15/2019, I will maintain and continue medications as prescribed.      Patient Stated     03/16/2022, wants to lose weight and wants to start playing softball again next year     Weight (lb) < 200 lb (90.7 kg)     "Lose weight"        This is a list of the screening recommended for you and due dates:  Health Maintenance  Topic Date Due   DTaP/Tdap/Td vaccine (2 - Td or Tdap) 12/01/2021   Colon Cancer Screening  03/08/2022   COVID-19 Vaccine (7 - 2023-24 season) 08/13/2022   Flu Shot  04/28/2023   Medicare Annual Wellness Visit  05/08/2024   Pneumonia Vaccine  Completed   Hepatitis C Screening  Completed   Zoster (Shingles) Vaccine  Completed   HPV Vaccine  Aged Out    Advanced directives: As a reminder, your Advance Directives should be updated every 10 years. The copy that we have on your chart was signed in 2015.  Conditions/risks identified:  A referral has been placed for you to have a hearing test. If you have not heard from them within 7 business  days, please call the office to schedule and appointment.   [facility] [address] [place] [address] Phone: [phone]   You are due for the vaccines checked below. You may have these done at your preferred pharmacy. Please have them fax the office proof of the vaccines so that we can update your chart.   [x]  Flu (due annually) The office should send a message when this is available []  Shingrix (Shingles vaccine) []  Pneumonia Vaccines [x]  TDAP (Tetanus) Vaccine every 10 years Medicare only covers this if given at a pharmacy. Unless you have a cut or puncture.  [x]  Covid-19 If a new booster is available.     Next appointment: VIRTUAL/ TELEPHONE VISIT Follow up in one year for your annual wellness visit  May 14, 2024 at 8:15 am telephone visit.    Preventive Care 2 Years and Older, Male  Preventive care refers to  lifestyle choices and visits with your health care provider that can promote health and wellness. What does preventive care include? A yearly physical exam. This is also called an annual well check. Dental exams once or twice a year. Routine eye exams. Ask your health care provider how often you should have your eyes checked. Personal lifestyle choices, including: Daily care of your teeth and gums. Regular physical activity. Eating a healthy diet. Avoiding tobacco and drug use. Limiting alcohol use. Practicing safe sex. Taking low doses of aspirin every day. Taking vitamin and mineral supplements as recommended by your health care provider. What happens during an annual well check? The services and screenings done by your health care provider during your annual well check will depend on your age, overall health, lifestyle risk factors, and family history of disease. Counseling  Your health care provider may ask you questions about your: Alcohol use. Tobacco use. Drug use. Emotional well-being. Home and relationship well-being. Sexual  activity. Eating habits. History of falls. Memory and ability to understand (cognition). Work and work Astronomer. Screening  You may have the following tests or measurements: Height, weight, and BMI. Blood pressure. Lipid and cholesterol levels. These may be checked every 5 years, or more frequently if you are over 92 years old. Skin check. Lung cancer screening. You may have this screening every year starting at age 68 if you have a 30-pack-year history of smoking and currently smoke or have quit within the past 15 years. Fecal occult blood test (FOBT) of the stool. You may have this test every year starting at age 35. Flexible sigmoidoscopy or colonoscopy. You may have a sigmoidoscopy every 5 years or a colonoscopy every 10 years starting at age 36. Prostate cancer screening. Recommendations will vary depending on your family history and other risks. Hepatitis C blood test. Hepatitis B blood test. Sexually transmitted disease (STD) testing. Diabetes screening. This is done by checking your blood sugar (glucose) after you have not eaten for a while (fasting). You may have this done every 1-3 years. Abdominal aortic aneurysm (AAA) screening. You may need this if you are a current or former smoker. Osteoporosis. You may be screened starting at age 66 if you are at high risk. Talk with your health care provider about your test results, treatment options, and if necessary, the need for more tests. Vaccines  Your health care provider may recommend certain vaccines, such as: Influenza vaccine. This is recommended every year. Tetanus, diphtheria, and acellular pertussis (Tdap, Td) vaccine. You may need a Td booster every 10 years. Zoster vaccine. You may need this after age 63. Pneumococcal 13-valent conjugate (PCV13) vaccine. One dose is recommended after age 67. Pneumococcal polysaccharide (PPSV23) vaccine. One dose is recommended after age 43. Talk to your health care provider about which  screenings and vaccines you need and how often you need them. This information is not intended to replace advice given to you by your health care provider. Make sure you discuss any questions you have with your health care provider. Document Released: 10/10/2015 Document Revised: 06/02/2016 Document Reviewed: 07/15/2015 Elsevier Interactive Patient Education  2017 [employer].  Fall Prevention in the Home Falls can cause injuries. They can happen to people of all ages. There are many things you can do to make your home safe and to help prevent falls. What can I do on the outside of my home? Regularly fix the edges of walkways and driveways and fix any cracks. Remove anything that might make you trip  as you walk through a door, such as a raised step or threshold. Trim any bushes or trees on the path to your home. Use bright outdoor lighting. Clear any walking paths of anything that might make someone trip, such as rocks or tools. Regularly check to see if handrails are loose or broken. Make sure that both sides of any steps have handrails. Any raised decks and porches should have guardrails on the edges. Have any leaves, snow, or ice cleared regularly. Use sand or salt on walking paths during winter. Clean up any spills in your garage right away. This includes oil or grease spills. What can I do in the bathroom? Use night lights. Install grab bars by the toilet and in the tub and shower. Do not use towel bars as grab bars. Use non-skid mats or decals in the tub or shower. If you need to sit down in the shower, use a plastic, non-slip stool. Keep the floor dry. Clean up any water that spills on the floor as soon as it happens. Remove soap buildup in the tub or shower regularly. Attach bath mats securely with double-sided non-slip rug tape. Do not have throw rugs and other things on the floor that can make you trip. What can I do in the bedroom? Use night lights. Make sure that you have a  light by your bed that is easy to reach. Do not use any sheets or blankets that are too big for your bed. They should not hang down onto the floor. Have a firm chair that has side arms. You can use this for support while you get dressed. Do not have throw rugs and other things on the floor that can make you trip. What can I do in the kitchen? Clean up any spills right away. Avoid walking on wet floors. Keep items that you use a lot in easy-to-reach places. If you need to reach something above you, use a strong step stool that has a grab bar. Keep electrical cords out of the way. Do not use floor polish or wax that makes floors slippery. If you must use wax, use non-skid floor wax. Do not have throw rugs and other things on the floor that can make you trip. What can I do with my stairs? Do not leave any items on the stairs. Make sure that there are handrails on both sides of the stairs and use them. Fix handrails that are broken or loose. Make sure that handrails are as long as the stairways. Check any carpeting to make sure that it is firmly attached to the stairs. Fix any carpet that is loose or worn. Avoid having throw rugs at the top or bottom of the stairs. If you do have throw rugs, attach them to the floor with carpet tape. Make sure that you have a light switch at the top of the stairs and the bottom of the stairs. If you do not have them, ask someone to add them for you. What else can I do to help prevent falls? Wear shoes that: Do not have high heels. Have rubber bottoms. Are comfortable and fit you well. Are closed at the toe. Do not wear sandals. If you use a stepladder: Make sure that it is fully opened. Do not climb a closed stepladder. Make sure that both sides of the stepladder are locked into place. Ask someone to hold it for you, if possible. Clearly mark and make sure that you can see: Any grab bars or handrails.  First and last steps. Where the edge of each step  is. Use tools that help you move around (mobility aids) if they are needed. These include: Canes. Walkers. Scooters. Crutches. Turn on the lights when you go into a dark area. Replace any light bulbs as soon as they burn out. Set up your furniture so you have a clear path. Avoid moving your furniture around. If any of your floors are uneven, fix them. If there are any pets around you, be aware of where they are. Review your medicines with your doctor. Some medicines can make you feel dizzy. This can increase your chance of falling. Ask your doctor what other things that you can do to help prevent falls. This information is not intended to replace advice given to you by your health care provider. Make sure you discuss any questions you have with your health care provider. Document Released: 07/10/2009 Document Revised: 02/19/2016 Document Reviewed: 10/18/2014 Elsevier Interactive Patient Education  2017 [employer].

## 2023-05-10 ENCOUNTER — Telehealth: Payer: Self-pay | Admitting: Family Medicine

## 2023-05-10 ENCOUNTER — Encounter: Payer: Self-pay | Admitting: *Deleted

## 2023-05-10 NOTE — Telephone Encounter (Signed)
Referral has been re-routed to [facility].   [facility] in [facility] [address] [place], [address] [place], [place] [address] Phone: [phone]

## 2023-05-10 NOTE — Telephone Encounter (Signed)
Pt called in stated he receive notice of referral to the ENT in [place] , but would like to see an ENT in [place] . Please advise [phone]

## 2023-05-13 DIAGNOSIS — L820 Inflamed seborrheic keratosis: Secondary | ICD-10-CM | POA: Diagnosis not present

## 2023-05-13 DIAGNOSIS — L298 Other pruritus: Secondary | ICD-10-CM | POA: Diagnosis not present

## 2023-05-13 DIAGNOSIS — L538 Other specified erythematous conditions: Secondary | ICD-10-CM | POA: Diagnosis not present

## 2023-05-13 DIAGNOSIS — D225 Melanocytic nevi of trunk: Secondary | ICD-10-CM | POA: Diagnosis not present

## 2023-05-13 DIAGNOSIS — L821 Other seborrheic keratosis: Secondary | ICD-10-CM | POA: Diagnosis not present

## 2023-05-13 DIAGNOSIS — L814 Other melanin hyperpigmentation: Secondary | ICD-10-CM | POA: Diagnosis not present

## 2023-05-17 NOTE — Telephone Encounter (Signed)
Please schedule CPE with me as overdue.

## 2023-05-18 DIAGNOSIS — H903 Sensorineural hearing loss, bilateral: Secondary | ICD-10-CM | POA: Diagnosis not present

## 2023-06-09 DIAGNOSIS — H35373 Puckering of macula, bilateral: Secondary | ICD-10-CM | POA: Diagnosis not present

## 2023-07-14 ENCOUNTER — Encounter: Payer: Self-pay | Admitting: Cardiology

## 2023-07-14 ENCOUNTER — Encounter: Payer: Self-pay | Admitting: Family Medicine

## 2023-07-14 NOTE — Telephone Encounter (Signed)
Updated pt's chart.

## 2023-07-15 ENCOUNTER — Other Ambulatory Visit: Payer: Self-pay

## 2023-07-15 DIAGNOSIS — I350 Nonrheumatic aortic (valve) stenosis: Secondary | ICD-10-CM

## 2023-07-15 DIAGNOSIS — Z952 Presence of prosthetic heart valve: Secondary | ICD-10-CM

## 2023-07-15 NOTE — Telephone Encounter (Signed)
Spoke to patient echo appointment scheduled 10/05/23 at 10:20 am.Appointment scheduled with Dr.Jordan 10/12/23 at 3:20 pm.

## 2023-08-03 ENCOUNTER — Other Ambulatory Visit: Payer: Self-pay | Admitting: Cardiology

## 2023-08-03 DIAGNOSIS — I10 Essential (primary) hypertension: Secondary | ICD-10-CM

## 2023-09-13 ENCOUNTER — Other Ambulatory Visit: Payer: Medicare HMO

## 2023-09-20 ENCOUNTER — Encounter: Payer: Medicare HMO | Admitting: Family Medicine

## 2023-10-01 IMAGING — DX DG CHEST 2V
2 series · 2 of 2 positions shown · non-contrast
Comparison: Comparison made with Deoliveira twelve twenty.

CLINICAL DATA: Cough, asthma. Productive cough for three weeks in a
[age] year old male.

EXAM:
CHEST - 2 VIEW

[chest pa]
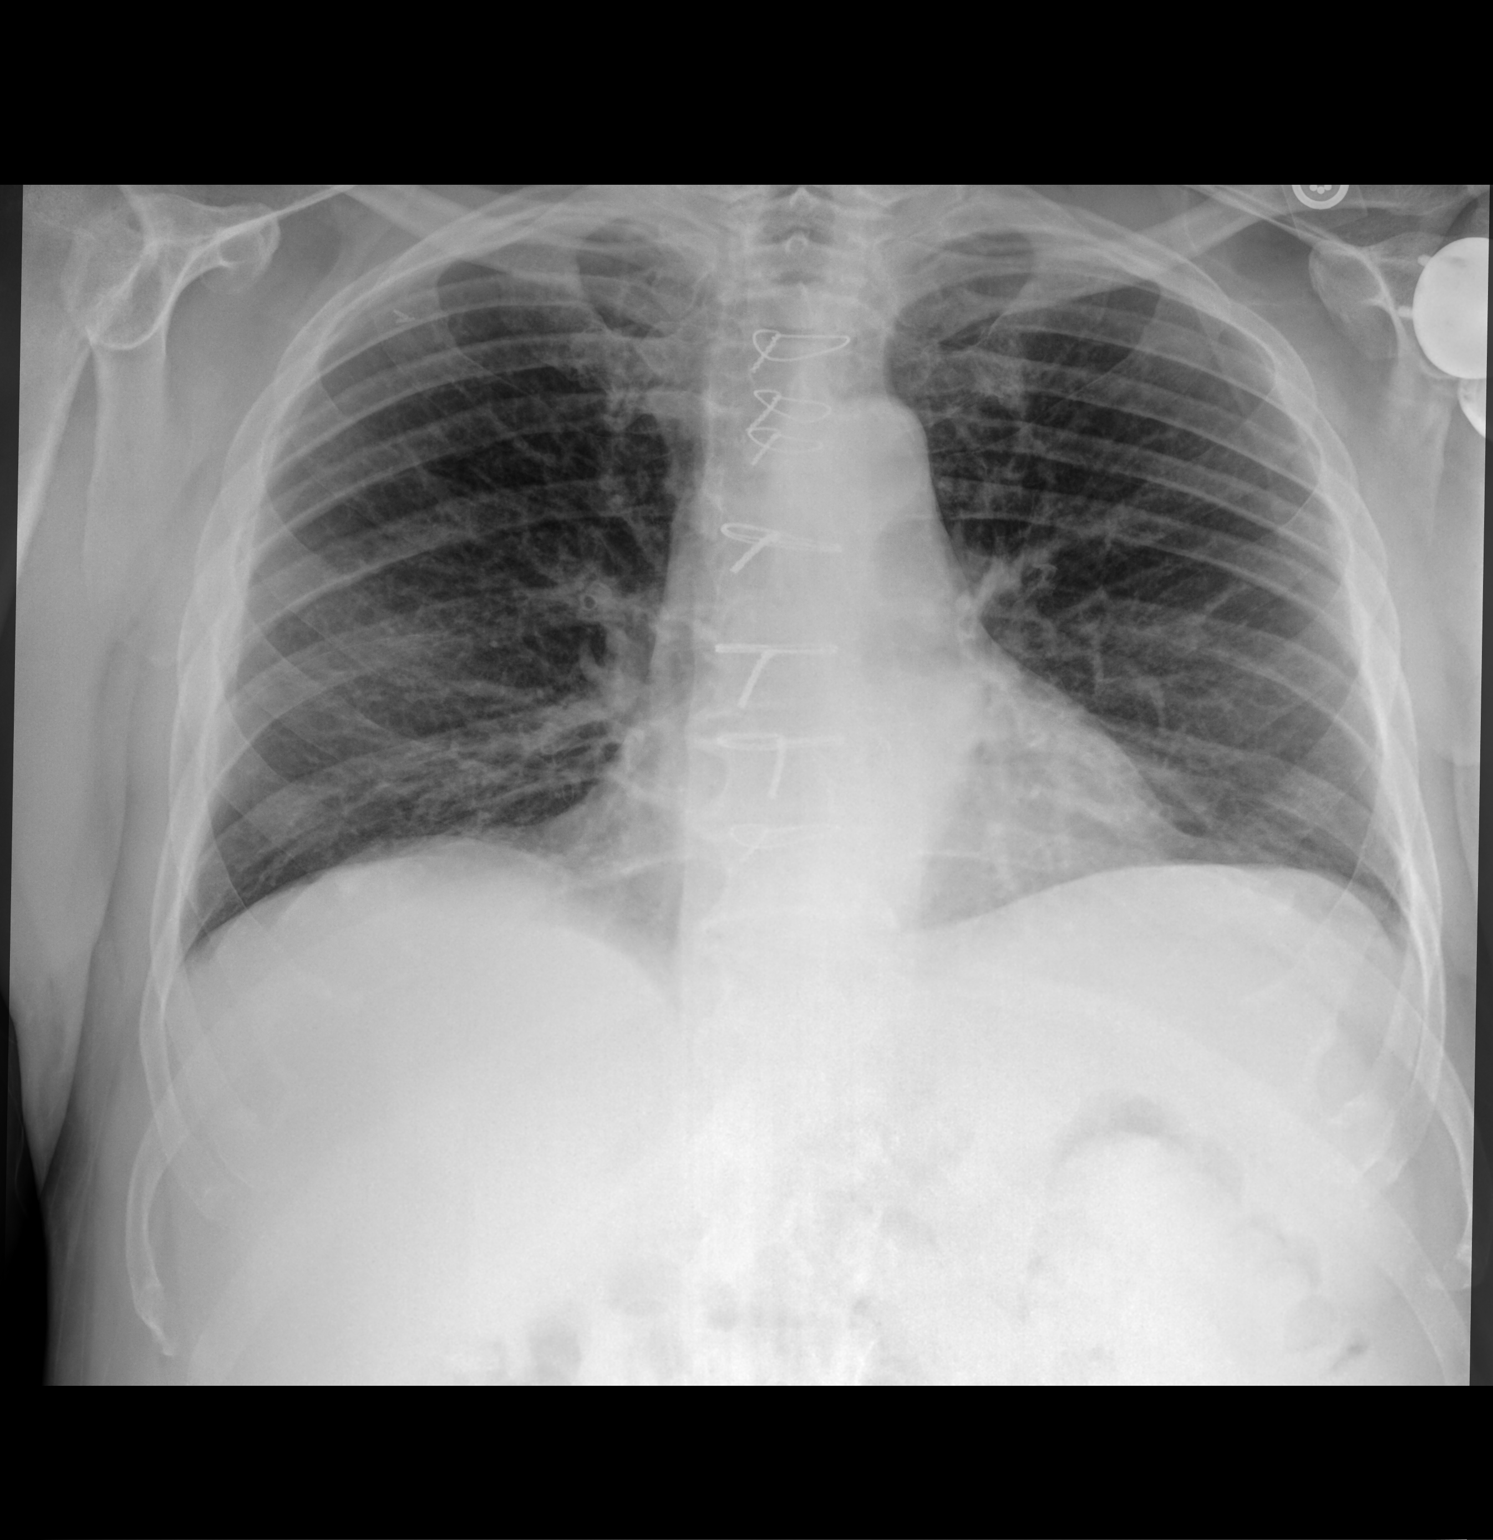

[chest lat]
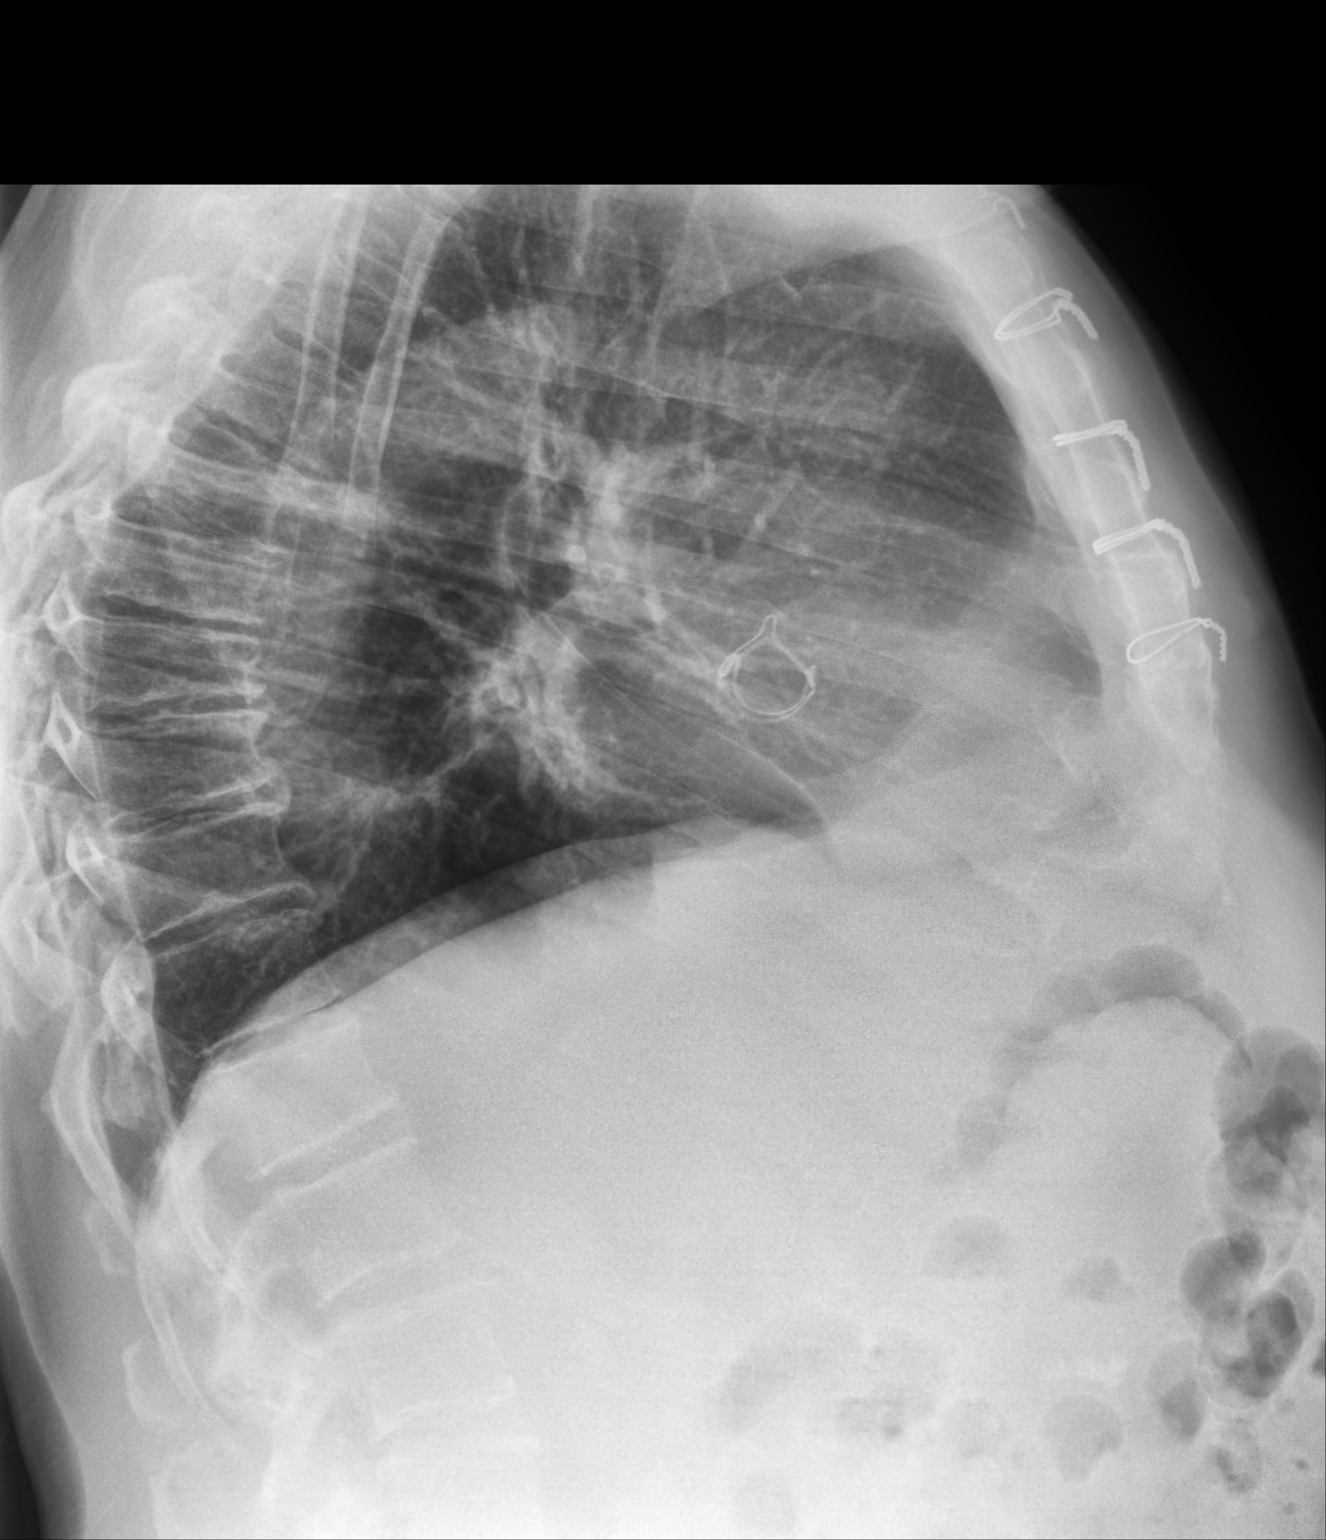

[2 of 2 positions shown; findings below may reference images not displayed]

FINDINGS: Trachea midline.

Cardiomediastinal contours and hilar structures are normal.

No lobar consolidation.

Central airway thickening.

No pneumothorax. No pleural effusion. Post aortic valve replacement.

On limited assessment there is no acute skeletal process.
IMPRESSION: Central airway thickening without lobar consolidation. Findings may
reflect bronchitis/bronchopneumonia.

## 2023-10-05 ENCOUNTER — Ambulatory Visit (HOSPITAL_COMMUNITY): Payer: Medicare HMO | Attending: Cardiology

## 2023-10-05 DIAGNOSIS — I350 Nonrheumatic aortic (valve) stenosis: Secondary | ICD-10-CM | POA: Insufficient documentation

## 2023-10-05 LAB — ECHOCARDIOGRAM COMPLETE
AV Mean grad: 11.5 mm[Hg]
AV Peak grad: 21.3 mm[Hg]
Ao pk vel: 2.31 m/s
Area-P 1/2: 3.48 cm2
S' Lateral: 3 cm

## 2023-10-08 NOTE — Progress Notes (Signed)
 Jeremy Meadows Date of Birth: [birth year] Medical Record #[id number]  History of Present Illness: [name] is seen for followup of aortic stenosis and  thoracic aortic aneurysm. He is s/p AVR and aortic aneurysm grafting by Dr. Nicanor Barge on 07/02/14 with a #23 Magna Ease pericardial valve and 32 mm Hema shield graft. His post op course was complicated by atrial fibrillation that converted to NSR on amiodarone . Amiodarone  was later discontinued and he has had no further arrhythmia.   In Nov 2016 he had an incisional hernia repaired by Dr Nicanor Barge, he tolerated this well. CT of the aorta in December 2017 was stable. Repeat Echo 05/31/19 showed normal LV function and normal AV prosthetic function.  He underwent left shoulder surgery in November 2021. He had right shoulder replaced in July 2023.   Still active exercising. No chest pain or SOB.  No palpitations or dizziness. BP at home has been consistently controlled.    Current Outpatient Medications on File Prior to Visit  Medication Sig Dispense Refill   amLODipine  (NORVASC ) 2.5 MG tablet TAKE 1 TABLET EVERY DAY 90 tablet 3   aspirin  81 MG tablet Take 81 mg by mouth daily.     atorvastatin  (LIPITOR) 10 MG tablet TAKE 1 TABLET EVERY DAY AT 6PM 90 tablet 3   cholecalciferol (VITAMIN D) 1000 UNITS tablet Take 1,000 Units by mouth daily.     dorzolamide-timolol (COSOPT) 22.3-6.8 MG/ML ophthalmic solution 1 drop 2 (two) times daily.     metoprolol  tartrate (LOPRESSOR ) 50 MG tablet TAKE 1 TABLET TWICE DAILY 180 tablet 3   vitamin C (ASCORBIC ACID) 500 MG tablet Take 500 mg by mouth daily.      albuterol  (VENTOLIN  HFA) 108 (90 Base) MCG/ACT inhaler Inhale 2 puffs into the lungs every 6 (six) hours as needed for wheezing or shortness of breath. 54 g 1   Current Facility-Administered Medications on File Prior to Visit  Medication Dose Route Frequency Provider Last Rate Last Admin   0.9 %  sodium chloride  infusion  500 mL Intravenous Continuous  Asencion Blacksmith, MD        Allergies  Allergen Reactions   Ace Inhibitors Cough    Hacking cough    Past Medical History:  Diagnosis Date   Allergic rhinitis, cause unspecified    Aortic insufficiency    s/p AVR 2015   Aortic root enlargement (HCC)    thoracic aorta   Aortic valve stenosis 2015   s/p AVR 2015 Nicanor Barge) - completed cardiac rehab 08/2014   Arthritis    Asthma    Atrial fibrillation (HCC) 07/23/2014   Benign neoplasm of colon    Gallstones    by CT   History of kidney stones    History of nephrolithiasis    HLD (hyperlipidemia)    Hypertension    Need for prophylactic antibiotic    Obstructive sleep apnea (adult) (pediatric)    pt. doesn't use his machine at home.   Other testicular hypofunction    Pulmonary nodule, left 2015   2cm, ?sarcoid by biopsy   Unspecified glaucoma(365.9)     Past Surgical History:  Procedure Laterality Date   AORTIC VALVE REPLACEMENT N/A 07/02/2014   Procedure: AORTIC VALVE REPLACEMENT (AVR) with 23 Aortic Magna Ease;  Surgeon: Norita Beauvais, MD   ASCENDING AORTIC ROOT REPLACEMENT N/A 07/02/2014   Procedure: supra coronary  ASCENDING AORTIC REPLACEMENT to the Inominate Artery with 32mm Hemashield Platinum with  circulatory arrest;  Surgeon: Norita Beauvais, MD  CARDIAC CATHETERIZATION     CARDIAC VALVE REPLACEMENT     CATARACT EXTRACTION Bilateral    COLONOSCOPY  2013   WNL, rpt 5 yrs Sandrea Cruel)   COLONOSCOPY  2018   1 TA, diverticulosis, rpt 5 yrs Sandrea Cruel)   EYE SURGERY     INTRAOPERATIVE TRANSESOPHAGEAL ECHOCARDIOGRAM N/A 07/02/2014   JOINT REPLACEMENT Right 2014   partial replacement   KNEE SURGERY Right 05/2012   arthroscopic   KNEE SURGERY Right 12/2013   patella   LEFT AND RIGHT HEART CATHETERIZATION WITH CORONARY ANGIOGRAM N/A 05/22/2014   no significant obstructive CAD ([place])   LITHOTRIPSY     LUNG BIOPSY Left 2015   by IR - benign   MEDIAL PARTIAL KNEE REPLACEMENT Right 03/2013   REVERSE TOTAL  SHOULDER ARTHROPLASTY Right 03/2022   Dr Monna Antes   RHINOPLASTY  1980s   VENTRAL HERNIA REPAIR  08/26/2015   VENTRAL HERNIA REPAIR N/A 08/26/2015   Procedure: VENTRAL HERNIA REPAIR WITH MESH ADULT;  Surgeon: Norita Beauvais, MD;  Location[facility] OR;  Service: Vascular;  Laterality: N/A;    Social History   Tobacco Use  Smoking Status Never  Smokeless Tobacco Never    Social History   Substance and Sexual Activity  Alcohol Use No   Alcohol/week: 0.0 standard drinks of alcohol    Family History  Problem Relation Age of Onset   Cancer Mother        Jaw   CAD Father 50       MI   Heart disease Brother    Heart disease Brother    Heart disease Brother    Colon cancer Neg Hx    Colon polyps Neg Hx    Esophageal cancer Neg Hx    Rectal cancer Neg Hx    Stomach cancer Neg Hx     Review of Systems: As noted in history of present illness. All other systems were reviewed and are negative.  Physical Exam: BP 126/84 (BP Location: Left Arm)   Pulse 78   Ht 5\' 11"  (1.803 m)   Wt 232 lb (105.2 kg)   SpO2 98%   BMI 32.36 kg/m  GENERAL:  Well appearing WM in NAD HEENT:  PERRL, EOMI, sclera are clear. Oropharynx is clear. NECK:  No jugular venous distention, carotid upstroke brisk and symmetric, no bruits, no thyromegaly or adenopathy LUNGS:  Clear to auscultation bilaterally CHEST:  Unremarkable HEART:  RRR,  PMI not displaced or sustained,S1 and S2 within normal limits, no S3, no S4: no clicks, no rubs, soft 1/6 systolic murmur RUSB ABD:  Soft, nontender. BS +, no masses or bruits. No hepatomegaly, no splenomegaly EXT:  2 + pulses throughout, no edema, no cyanosis no clubbing SKIN:  Warm and dry.  No rashes NEURO:  Alert and oriented x 3. Cranial nerves II through XII intact. PSYCH:  Cognitively intact    LABORATORY DATA: Lab Results  Component Value Date   WBC 7.4 03/02/2021   HGB 15.2 03/02/2021   HCT 43.8 03/02/2021   PLT 159.0 03/02/2021   GLUCOSE 103  (H) 03/02/2021   CHOL 113 03/02/2021   TRIG 159.0 (H) 03/02/2021   HDL 31.30 (L) 03/02/2021   LDLDIRECT 67.0 08/15/2019   LDLCALC 49 03/02/2021   ALT 19 03/02/2021   AST 25 03/02/2021   NA 140 03/02/2021   K 4.5 03/02/2021   CL 105 03/02/2021   CREATININE 1.10 03/02/2021   BUN 24 (H) 03/02/2021   CO2 27 03/02/2021  TSH 2.09 08/15/2019   PSA 0.88 03/02/2021   INR 0.99 08/14/2015   HGBA1C 5.8 03/02/2021   MICROALBUR 1.3 03/02/2021   EKG Interpretation Date/Time:  Wednesday October 12 2023 15:28:51 EST Ventricular Rate:  78 PR Interval:  192 QRS Duration:  122 QT Interval:  384 QTC Calculation: 437 R Axis:   21  Text Interpretation: Normal sinus rhythm Right bundle branch block  T wave abnormality, consider lateral ischemia Since tracing Oct 15, 2022 PVCs resolved.  Confirmed by [place], Devorah Givhan [phone]) on 10/12/2023 3:39:00 PM      Echo: 08/02/14: Study Conclusions  - Left ventricle: The cavity size was normal. Wall thickness was   normal. Systolic function was normal. The estimated ejection   fraction was in the range of 60% to 65%. Features are consistent   with a pseudonormal left ventricular filling pattern, with   concomitant abnormal relaxation and increased filling pressure   (grade 2 diastolic dysfunction). - Aortic valve: AV valve prosthesis opens well Peak and mean   gradients through the valve are 17 and 10 mm Hg respectively. - Mitral valve: There was mild regurgitation. - Left atrium: The atrium was moderately dilated.   CT CHEST WITHOUT CONTRAST   TECHNIQUE: Multidetector CT imaging of the chest was performed following the standard protocol without IV contrast.   COMPARISON:  02/20/2015   FINDINGS: Cardiovascular: No acute findings. Previous aortic valve replacement. Stable appearance of the ascending aortic graft. Distal ascending aorta along the distal aspect of the graft measures 4.5 cm in maximum diameter, which remains stable. No evidence  of mediastinal hematoma.   Aortic and coronary artery atherosclerotic calcification noted. Normal heart size. No evidence pericardial effusion.   Mediastinum/Nodes: No masses or pathologically enlarged lymph nodes identified on this unenhanced exam.   Lungs/Pleura: No pulmonary infiltrate or mass identified. Mild scarring noted posterior left lower lobe. No effusion present.   Upper Abdomen:  Small calcified gallstones incidentally noted.   Musculoskeletal:  No suspicious bone lesions.   IMPRESSION: Previous aortic valve replacement. Stable appearance of ascending aortic graft. Stable mild dilatation of distal ascending aorta along the distal aspect of the graft, measuring 4.5 cm in maximum diameter.   No acute findings.   Incidentally noted aortic and coronary atherosclerosis and cholelithiasis.     Electronically Signed   By: Nicanor Barge M.D.   On: 09/15/2016 14:38  Echo 05/31/19: IMPRESSIONS      1. The left ventricle has normal systolic function, with an ejection fraction of 55-60%. The cavity size was normal. Left ventricular diastolic parameters were normal. Indeterminate filling pressures No evidence of left ventricular regional wall motion  abnormalities.  2. The right ventricle has normal systolic function. The cavity was normal. There is no increase in right ventricular wall thickness.  3. The mitral valve is abnormal. Mild thickening of the mitral valve leaflet.  4. The tricuspid valve is grossly normal.  5. A 23mm Magna bioprosthesis valve is present in the aortic position. Normal aortic valve prosthesis.  6. Prosthetic aortic graft in the ascending aorta position.  7. The aorta is normal unless otherwise noted.  8. The inferior vena cava was normal in size with <50% respiratory variability.  9. When compared to the prior study: 08/02/14 EF 60-65%. AV max, mean.  Echo 10/05/23: IMPRESSIONS     1. Left ventricular ejection fraction, by estimation,  is 60 to 65%. The  left ventricle has normal function. The left ventricle has no regional  wall  motion abnormalities. Left ventricular diastolic parameters are  consistent with Grade II diastolic  dysfunction (pseudonormalization). The average left ventricular global  longitudinal strain is -18.0 %. The global longitudinal strain is normal.   2. Right ventricular systolic function is normal. The right ventricular  size is normal. There is normal pulmonary artery systolic pressure.   3. Left atrial size was mild to moderately dilated.   4. Right atrial size was moderately dilated.   5. The mitral valve is normal in structure. Mild mitral valve  regurgitation. No evidence of mitral stenosis.   6. The aortic valve is normal in structure. Aortic valve regurgitation is  not visualized. No aortic stenosis is present. There is a 23 mm Magna  pericardial valve present in the aortic position. Procedure Date: 2015.  Echo findings are consistent with  normal structure and function of the aortic valve prosthesis.   7. The inferior vena cava is normal in size with greater than 50%  respiratory variability, suggesting right atrial pressure of 3 mmHg.    Assessment / Plan: 1. Aortic stenosis/bicuspid AV -  S/p pericardial tissue valve replacement in October 2015. Doing well.  Repeat Echo recently showed normal prosthetic function. SBE prophylaxis. Exam today is good.   2. Thoracic aortic aneurysm. S/p grafting. CT showed good repair with stable follow up in December 2017.   3. Hypertension-satisfactory control   4.PVCs asymptomatic. Noted intermittently in past. Normal LV function. Continue metoprolol .  5. RBBB new since 2021. No symptoms. Benign.   Follow up in one year

## 2023-10-12 ENCOUNTER — Ambulatory Visit: Payer: Medicare HMO | Attending: Cardiology | Admitting: Cardiology

## 2023-10-12 ENCOUNTER — Encounter: Payer: Self-pay | Admitting: Cardiology

## 2023-10-12 VITALS — BP 126/84 | HR 78 | Ht 71.0 in | Wt 232.0 lb

## 2023-10-12 DIAGNOSIS — I7121 Aneurysm of the ascending aorta, without rupture: Secondary | ICD-10-CM | POA: Diagnosis not present

## 2023-10-12 DIAGNOSIS — Z952 Presence of prosthetic heart valve: Secondary | ICD-10-CM

## 2023-10-12 DIAGNOSIS — I10 Essential (primary) hypertension: Secondary | ICD-10-CM | POA: Diagnosis not present

## 2023-10-12 NOTE — Patient Instructions (Signed)
 Medication Instructions:  Continue same medications *If you need a refill on your cardiac medications before your next appointment, please call your pharmacy*   Lab Work: None ordered   Testing/Procedures: None ordered   Follow-Up: At [facility], you and your health needs are our priority.  As part of our continuing mission to provide you with exceptional heart care, we have created designated Provider Care Teams.  These Care Teams include your primary Cardiologist (physician) and Advanced Practice Providers (APPs -  Physician Assistants and Nurse Practitioners) who all work together to provide you with the care you need, when you need it.  We recommend signing up for the patient portal called "MyChart".  Sign up information is provided on this After Visit Summary.  MyChart is used to connect with patients for Virtual Visits (Telemedicine).  Patients are able to view lab/test results, encounter notes, upcoming appointments, etc.  Non-urgent messages can be sent to your provider as well.   To learn more about what you can do with MyChart, go to [email or web address].    Your next appointment:  1 year   Call in Oct to schedule Jan appointment     Provider:  Dr.Jordan

## 2023-10-25 ENCOUNTER — Other Ambulatory Visit: Payer: Self-pay | Admitting: Family Medicine

## 2023-10-25 ENCOUNTER — Other Ambulatory Visit (INDEPENDENT_AMBULATORY_CARE_PROVIDER_SITE_OTHER): Payer: Medicare HMO

## 2023-10-25 DIAGNOSIS — I10 Essential (primary) hypertension: Secondary | ICD-10-CM

## 2023-10-25 DIAGNOSIS — D869 Sarcoidosis, unspecified: Secondary | ICD-10-CM

## 2023-10-25 DIAGNOSIS — R7303 Prediabetes: Secondary | ICD-10-CM | POA: Diagnosis not present

## 2023-10-25 DIAGNOSIS — E7800 Pure hypercholesterolemia, unspecified: Secondary | ICD-10-CM | POA: Diagnosis not present

## 2023-10-25 DIAGNOSIS — I4891 Unspecified atrial fibrillation: Secondary | ICD-10-CM

## 2023-10-25 LAB — COMPREHENSIVE METABOLIC PANEL
ALT: 25 U/L (ref 0–53)
AST: 30 U/L (ref 0–37)
Albumin: 4.2 g/dL (ref 3.5–5.2)
Alkaline Phosphatase: 73 U/L (ref 39–117)
BUN: 22 mg/dL (ref 6–23)
CO2: 29 meq/L (ref 19–32)
Calcium: 9.1 mg/dL (ref 8.4–10.5)
Chloride: 106 meq/L (ref 96–112)
Creatinine, Ser: 1.22 mg/dL (ref 0.40–1.50)
GFR: 56.99 mL/min — ABNORMAL LOW (ref >60.00)
Glucose, Bld: 116 mg/dL — ABNORMAL HIGH (ref 70–99)
Potassium: 4 meq/L (ref 3.5–5.1)
Sodium: 141 meq/L (ref 135–145)
Total Bilirubin: 0.7 mg/dL (ref 0.2–1.2)
Total Protein: 7 g/dL (ref 6.0–8.3)

## 2023-10-25 LAB — CBC WITH DIFFERENTIAL/PLATELET
Basophils Absolute: 0 10*3/uL (ref 0.0–0.1)
Basophils Relative: 0.5 % (ref 0.0–3.0)
Eosinophils Absolute: 0.2 10*3/uL (ref 0.0–0.7)
Eosinophils Relative: 2.9 % (ref 0.0–5.0)
HCT: 46.5 % (ref 39.0–52.0)
Hemoglobin: 15.7 g/dL (ref 13.0–17.0)
Lymphocytes Relative: 28.2 % (ref 12.0–46.0)
Lymphs Abs: 2.1 10*3/uL (ref 0.7–4.0)
MCHC: 33.8 g/dL (ref 30.0–36.0)
MCV: 96 fL (ref 78.0–100.0)
Monocytes Absolute: 0.8 10*3/uL (ref 0.1–1.0)
Monocytes Relative: 10.5 % (ref 3.0–12.0)
Neutro Abs: 4.3 10*3/uL (ref 1.4–7.7)
Neutrophils Relative %: 57.9 % (ref 43.0–77.0)
Platelets: 170 10*3/uL (ref 150.0–400.0)
RBC: 4.84 Mil/uL (ref 4.22–5.81)
RDW: 13.4 % (ref 11.5–15.5)
WBC: 7.4 10*3/uL (ref 4.0–10.5)

## 2023-10-25 LAB — LIPID PANEL
Cholesterol: 110 mg/dL (ref 0–200)
HDL: 28.9 mg/dL — ABNORMAL LOW (ref >39.00)
LDL Cholesterol: 41 mg/dL (ref 0–99)
NonHDL: 81
Total CHOL/HDL Ratio: 4
Triglycerides: 201 mg/dL — ABNORMAL HIGH (ref 0.0–149.0)
VLDL: 40.2 mg/dL — ABNORMAL HIGH (ref 0.0–40.0)

## 2023-10-25 LAB — MICROALBUMIN / CREATININE URINE RATIO
Creatinine,U: 188.3 mg/dL
Microalb Creat Ratio: 1.3 mg/g (ref 0.0–30.0)
Microalb, Ur: 2.5 mg/dL — ABNORMAL HIGH (ref 0.0–1.9)

## 2023-10-25 LAB — HEMOGLOBIN A1C: Hgb A1c MFr Bld: 6 % (ref 4.6–6.5)

## 2023-11-01 ENCOUNTER — Encounter: Payer: Self-pay | Admitting: Family Medicine

## 2023-11-01 ENCOUNTER — Ambulatory Visit (INDEPENDENT_AMBULATORY_CARE_PROVIDER_SITE_OTHER): Payer: Medicare HMO | Admitting: Family Medicine

## 2023-11-01 VITALS — BP 138/86 | HR 71 | Temp 97.8°F | Ht 70.75 in | Wt 232.0 lb

## 2023-11-01 DIAGNOSIS — G4733 Obstructive sleep apnea (adult) (pediatric): Secondary | ICD-10-CM | POA: Diagnosis not present

## 2023-11-01 DIAGNOSIS — Z952 Presence of prosthetic heart valve: Secondary | ICD-10-CM

## 2023-11-01 DIAGNOSIS — E7800 Pure hypercholesterolemia, unspecified: Secondary | ICD-10-CM

## 2023-11-01 DIAGNOSIS — Z0000 Encounter for general adult medical examination without abnormal findings: Secondary | ICD-10-CM

## 2023-11-01 DIAGNOSIS — H409 Unspecified glaucoma: Secondary | ICD-10-CM

## 2023-11-01 DIAGNOSIS — R7303 Prediabetes: Secondary | ICD-10-CM

## 2023-11-01 DIAGNOSIS — J4520 Mild intermittent asthma, uncomplicated: Secondary | ICD-10-CM | POA: Diagnosis not present

## 2023-11-01 DIAGNOSIS — I10 Essential (primary) hypertension: Secondary | ICD-10-CM

## 2023-11-01 DIAGNOSIS — M25841 Other specified joint disorders, right hand: Secondary | ICD-10-CM

## 2023-11-01 DIAGNOSIS — Z7189 Other specified counseling: Secondary | ICD-10-CM

## 2023-11-01 DIAGNOSIS — D869 Sarcoidosis, unspecified: Secondary | ICD-10-CM

## 2023-11-01 DIAGNOSIS — R053 Chronic cough: Secondary | ICD-10-CM

## 2023-11-01 MED ORDER — LORATADINE 10 MG PO TABS: 10.0000 mg | ORAL_TABLET | Freq: Every day | ORAL | Status: DC

## 2023-11-01 MED ORDER — ALBUTEROL SULFATE HFA 108 (90 BASE) MCG/ACT IN AERS: 2.0000 | INHALATION_SPRAY | Freq: Four times a day (QID) | RESPIRATORY_TRACT | 2 refills | Status: AC | PRN

## 2023-11-01 MED ORDER — LORATADINE 10 MG PO TABS: 10.0000 mg | ORAL_TABLET | Freq: Every day | ORAL | Status: DC | PRN

## 2023-11-01 NOTE — Assessment & Plan Note (Signed)
Encouraged limiting added sugar/simple carbs in diet.

## 2023-11-01 NOTE — Assessment & Plan Note (Signed)
Chronic, trig elevated despite atorvastatin 20mg  daily. Reviewed diet choices to improve trig levels. The ASCVD Risk score (Arnett DK, et al., 2019) failed to calculate for the following reasons:   The valid total cholesterol range is 130 to 320 mg/dL

## 2023-11-01 NOTE — Assessment & Plan Note (Signed)
Sees eye doctor Q6mo. 

## 2023-11-01 NOTE — Assessment & Plan Note (Addendum)
H/o this by prior lung biopsy previously saw pulm. Consider updated ACE level.

## 2023-11-01 NOTE — Patient Instructions (Addendum)
 You may call [facility] to ask about repeat colonoscopy at [phone].   Albuterol  rescue inhaler refilled  May take fast relief guaifenesin (plain mucinex) with large glass of water in am to help clear congestion. May try over the counter claritin  allergy medicine as needed.  Good to see you today.  Return as needed or in 1 year for next physical/wellness visit

## 2023-11-01 NOTE — Assessment & Plan Note (Signed)
Chronic, stable. Conitnue current regimen.

## 2023-11-01 NOTE — Assessment & Plan Note (Addendum)
Quiescent period  Rescue albuterol inh refilled Discussed plain guaifenesin use, trial claritin for possible allergic component.

## 2023-11-01 NOTE — Progress Notes (Signed)
 Ph: [phone] Fax: [phone]   Patient ID: Jeremy Meadows, male    DOB: [birth year], 78 y.o.   MRN: [id number]  This visit was conducted in person.  BP 138/86   Pulse 71   Temp 97.8 F (36.6 C) (Oral)   Ht 5' 10.75 (1.797 m)   Wt 232 lb (105.2 kg)   SpO2 98%   BMI 32.59 kg/m    CC: CPE Subjective:   HPI: Jeremy Meadows is a 78 y.o. male presenting on 11/01/2023 for Annual Exam (MCR prt 2 [AWV- 05/09/23].)   Saw health advisor 04/2023 for medicare wellness visit. Note reviewed.  New hearing aides for last several months, saw [facility].   No results found.  Flowsheet Row Clinical Support from 05/09/2023 in [facility] [place]  PHQ-2 Total Score 0          05/09/2023    8:09 AM 05/06/2023   11:08 AM 04/15/2023    4:59 PM 03/16/2022   10:30 AM 03/15/2022    2:16 PM  Fall Risk   Falls in the past year? 0 0 0 0 0  Number falls in past yr: 0   0   Injury with Fall? 0   0 0  Risk for fall due to : No Fall Risks   Medication side effect   Follow up Falls prevention discussed   Falls evaluation completed;Education provided;Falls prevention discussed    Regularly sees cardiology (Jordan) for aortic stenosis/bicuspid aortic valve and thoracic aortic aneurysm s/p pericardial tissue AVR and aneurysm graft Bobby 2015). Had postop afib treated with amiodarone . Needs SBE ppx. H/o sarcoid.   Noted hard growth to right dorsal hand, not painful or bothersome or affecting wrist ROM.   S/p bilat shoulder replacement L 2021 followed by R (27976) H/o kidney stones saw [facility] ([place])   Chronic productive cough of mild mucous in the mornings, since prior to 2015. No GERD symptoms. No allergy symptoms.  H/o exercise induced asthma. Requests albuterol  refilled.    Preventative: COLONOSCOPY 2018 - 1 TA, diverticulosis, rpt 5 yrs [place])  Prostate cancer screening - h/o BPH. No nocturia. age out Lung cancer screening - not eligible  Flu  shot yearly COVID vaccine - Pfizer 10/2019, 11/2019, booster 07/2020, 01/2021, 05/2022, 06/2023 Pneumovax 2013, prevnar 07/2014  Tdap 11/2011 RSV - pending  zostavax - 08/2014 shingrix  - 09/2021, 12/2021 Advanced directives: scanned 07/2018. Wife Joann is HCPOA, then sons engineer, structural. Does not want prolonged life support if terminal condition, but ok with artificial nutrition/hydration.  Seat belt use discussed.  Sunscreen use discussed. No changing moles on skin. Saw derm 04/2023.  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 Interim medical history since our last visit reviewed. Allergies and medications reviewed and updated. Outpatient Medications Prior to Visit  Medication Sig Dispense Refill   amLODipine  (NORVASC ) 2.5 MG tablet TAKE 1 TABLET EVERY DAY 90 tablet 3   aspirin  81 MG tablet Take 81 mg by mouth daily.     atorvastatin  (LIPITOR) 10 MG tablet TAKE 1 TABLET EVERY DAY AT  6PM 90 tablet 3   cholecalciferol (VITAMIN D) 1000 UNITS tablet Take 1,000 Units by mouth daily.     dorzolamide-timolol (COSOPT) 22.3-6.8 MG/ML ophthalmic solution 1 drop 2 (two) times daily.     metoprolol  tartrate (LOPRESSOR ) 50 MG tablet TAKE 1 TABLET TWICE DAILY 180 tablet 3   vitamin C (ASCORBIC ACID) 500 MG tablet Take 500 mg by mouth daily.      Facility-Administered Medications Prior to Visit  Medication Dose Route Frequency Provider Last Rate Last Admin   0.9 %  sodium chloride  infusion  500 mL Intravenous Continuous Aneita Gwendlyn DASEN, MD         Per HPI unless specifically indicated in ROS section  below Review of Systems  Constitutional:  Negative for activity change, appetite change, chills, fatigue, fever and unexpected weight change.  HENT:  Negative for hearing loss.   Eyes:  Negative for visual disturbance.  Respiratory:  Positive for cough (chronic). Negative for chest tightness, shortness of breath and wheezing.   Cardiovascular:  Negative for chest pain, palpitations and leg swelling.  Gastrointestinal:  Negative for abdominal distention, abdominal pain, blood in stool, constipation, diarrhea, nausea and vomiting.  Genitourinary:  Negative for difficulty urinating and hematuria.  Musculoskeletal:  Negative for arthralgias, myalgias and neck pain.  Skin:  Negative for rash.  Neurological:  Negative for dizziness, seizures, syncope and headaches.  Hematological:  Negative for adenopathy. Does not bruise/bleed easily.  Psychiatric/Behavioral:  Negative for dysphoric mood. The patient is not nervous/anxious.     Objective:  BP 138/86   Pulse 71   Temp 97.8 F (36.6 C) (Oral)   Ht 5' 10.75 (1.797 m)   Wt 232 lb (105.2 kg)   SpO2 98%   BMI 32.59 kg/m   Wt Readings from Last 3 Encounters:  11/01/23 232 lb (105.2 kg)  10/12/23 232 lb (105.2 kg)  05/09/23 228 lb (103.4 kg)      Physical Exam Vitals and nursing note reviewed.  Constitutional:      General: He is not in acute distress.    Appearance: Normal appearance. He is well-developed. He is not ill-appearing.  HENT:     Head: Normocephalic and atraumatic.     Right Ear: Hearing, tympanic membrane, ear canal and external ear normal.     Left Ear: Hearing, tympanic membrane, ear canal and external ear normal.     Ears:     Comments: New hearing aides    Mouth/Throat:     Mouth: Mucous membranes are moist.     Pharynx: Oropharynx is clear. No oropharyngeal exudate or posterior oropharyngeal erythema.  Eyes:     General: No scleral icterus.    Extraocular Movements: Extraocular movements intact.      Conjunctiva/sclera: Conjunctivae normal.     Pupils: Pupils are equal, round, and reactive to light.  Neck:     Thyroid : No thyroid  mass or thyromegaly.     Vascular: No carotid bruit.  Cardiovascular:     Rate and Rhythm: Normal rate and regular rhythm.     Pulses: Normal pulses.          Radial pulses are 2+ on the right side and 2+ on the left side.     Heart sounds: Murmur (2/6 systolic USB) heard.  Pulmonary:     Effort: Pulmonary effort is normal. No respiratory distress.     Breath sounds: Normal breath sounds. No wheezing, rhonchi or rales.  Abdominal:     General: Bowel sounds are normal.  There is no distension.     Palpations: Abdomen is soft. There is no mass.     Tenderness: There is no abdominal tenderness. There is no guarding or rebound.     Hernia: No hernia is present.  Musculoskeletal:        General: Normal range of motion.     Cervical back: Normal range of motion and neck supple.     Right lower leg: No edema.     Left lower leg: No edema.  Lymphadenopathy:     Cervical: No cervical adenopathy.  Skin:    General: Skin is warm and dry.     Findings: No rash.     Comments: Vitiligo present  Neurological:     General: No focal deficit present.     Mental Status: He is alert and oriented to person, place, and time.  Psychiatric:        Mood and Affect: Mood normal.        Behavior: Behavior normal.        Thought Content: Thought content normal.        Judgment: Judgment normal.       Results for orders placed or performed in visit on 10/25/23  CBC with Differential/Platelet   Collection Time: 10/25/23  7:45 AM  Result Value Ref Range   WBC 7.4 4.0 - 10.5 K/uL   RBC 4.84 4.22 - 5.81 Mil/uL   Hemoglobin 15.7 13.0 - 17.0 g/dL   HCT 53.4 60.9 - 47.9 %   MCV 96.0 78.0 - 100.0 fl   MCHC 33.8 30.0 - 36.0 g/dL   RDW 86.5 88.4 - 84.4 %   Platelets 170.0 150.0 - 400.0 K/uL   Neutrophils Relative % 57.9 43.0 - 77.0 %   Lymphocytes Relative 28.2 12.0 - 46.0  %   Monocytes Relative 10.5 3.0 - 12.0 %   Eosinophils Relative 2.9 0.0 - 5.0 %   Basophils Relative 0.5 0.0 - 3.0 %   Neutro Abs 4.3 1.4 - 7.7 K/uL   Lymphs Abs 2.1 0.7 - 4.0 K/uL   Monocytes Absolute 0.8 0.1 - 1.0 K/uL   Eosinophils Absolute 0.2 0.0 - 0.7 K/uL   Basophils Absolute 0.0 0.0 - 0.1 K/uL  Hemoglobin A1c   Collection Time: 10/25/23  7:45 AM  Result Value Ref Range   Hgb A1c MFr Bld 6.0 4.6 - 6.5 %  Microalbumin / creatinine urine ratio   Collection Time: 10/25/23  7:45 AM  Result Value Ref Range   Microalb, Ur 2.5 (H) 0.0 - 1.9 mg/dL   Creatinine,U 811.6 mg/dL   Microalb Creat Ratio 1.3 0.0 - 30.0 mg/g  Comprehensive metabolic panel   Collection Time: 10/25/23  7:45 AM  Result Value Ref Range   Sodium 141 135 - 145 mEq/L   Potassium 4.0 3.5 - 5.1 mEq/L   Chloride 106 96 - 112 mEq/L   CO2 29 19 - 32 mEq/L   Glucose, Bld 116 (H) 70 - 99 mg/dL   BUN 22 6 - 23 mg/dL   Creatinine, Ser 8.77 0.40 - 1.50 mg/dL   Total Bilirubin 0.7 0.2 - 1.2 mg/dL   Alkaline Phosphatase 73 39 - 117 U/L   AST 30 0 - 37 U/L   ALT 25 0 - 53 U/L   Total Protein 7.0 6.0 - 8.3 g/dL   Albumin  4.2 3.5 - 5.2 g/dL   GFR 43.00 (L) >39.99 mL/min   Calcium  9.1 8.4 - 10.5 mg/dL  Lipid panel  Collection Time: 10/25/23  7:45 AM  Result Value Ref Range   Cholesterol 110 0 - 200 mg/dL   Triglycerides 798.9 (H) 0.0 - 149.0 mg/dL   HDL 71.09 (L) >60.99 mg/dL   VLDL 59.7 (H) 0.0 - 59.9 mg/dL   LDL Cholesterol 41 0 - 99 mg/dL   Total CHOL/HDL Ratio 4    NonHDL 81.00     Assessment & Plan:   Problem List Items Addressed This Visit     Advanced care planning/counseling discussion (Chronic)   Previously discussed      Health maintenance examination - Primary (Chronic)   Preventative protocols reviewed and updated unless pt declined. Discussed healthy diet and lifestyle.       HYPERCHOLESTEROLEMIA   Chronic, trig elevated despite atorvastatin  20mg  daily. Reviewed diet choices to improve  trig levels. The ASCVD Risk score (Arnett DK, et al., 2019) failed to calculate for the following reasons:   The valid total cholesterol range is 130 to 320 mg/dL       Obstructive sleep apnea   Will need to verify CPAP use      Glaucoma   Sees eye doctor Q47mo.       Essential hypertension   Chronic, stable. Conitnue current regimen.       Asthmatic bronchitis   Quiescent period  Rescue albuterol  inh refilled Discussed plain guaifenesin use, trial claritin  for possible allergic component.       Relevant Medications   albuterol  (VENTOLIN  HFA) 108 (90 Base) MCG/ACT inhaler   Sarcoidosis   H/o this by prior lung biopsy previously saw pulm. Consider updated ACE level.       S/P AVR (aortic valve replacement) and aortoplasty   Prediabetes   Encouraged limiting added sugar/simple carbs in diet.       RESOLVED: Chronic cough   Cyst of joint of right hand   R dorsal wrist cyst - ganglion vs bony cyst.  As not bothersome, will monitor for now.         Meds ordered this encounter  Medications   albuterol  (VENTOLIN  HFA) 108 (90 Base) MCG/ACT inhaler    Sig: Inhale 2 puffs into the lungs every 6 (six) hours as needed for wheezing or shortness of breath.    Dispense:  17 each    Refill:  2   DISCONTD: loratadine  (CLARITIN ) 10 MG tablet    Sig: Take 1 tablet (10 mg total) by mouth daily.   loratadine  (CLARITIN ) 10 MG tablet    Sig: Take 1 tablet (10 mg total) by mouth daily as needed for allergies.    No orders of the defined types were placed in this encounter.   Patient Instructions  You may call [facility] to ask about repeat colonoscopy at [phone].   Albuterol  rescue inhaler refilled  May take fast relief guaifenesin (plain mucinex) with large glass of water in am to help clear congestion. May try over the counter claritin  allergy medicine as needed.  Good to see you today.  Return as needed or in 1 year for next physical/wellness visit   Follow up  plan: Return in about 1 year (around 10/31/2024) for annual exam, prior fasting for blood work, medicare wellness visit.  Anton Blas, MD

## 2023-11-01 NOTE — Assessment & Plan Note (Signed)
R dorsal wrist cyst - ganglion vs bony cyst.  As not bothersome, will monitor for now.

## 2023-11-01 NOTE — Assessment & Plan Note (Signed)
Will need to verify CPAP use.

## 2023-11-01 NOTE — Assessment & Plan Note (Signed)
 Preventative protocols reviewed and updated unless pt declined. Discussed healthy diet and lifestyle.

## 2023-11-01 NOTE — Assessment & Plan Note (Signed)
 Previously discussed.

## 2023-11-05 ENCOUNTER — Encounter: Payer: Self-pay | Admitting: Family Medicine

## 2023-11-07 NOTE — Telephone Encounter (Signed)
 Updated pt's chart.

## 2023-12-08 DIAGNOSIS — H401131 Primary open-angle glaucoma, bilateral, mild stage: Secondary | ICD-10-CM | POA: Diagnosis not present

## 2024-05-14 ENCOUNTER — Ambulatory Visit (INDEPENDENT_AMBULATORY_CARE_PROVIDER_SITE_OTHER): Payer: Medicare HMO

## 2024-05-14 VITALS — BP 138/86 | Ht 70.75 in | Wt 225.0 lb

## 2024-05-14 DIAGNOSIS — Z0000 Encounter for general adult medical examination without abnormal findings: Secondary | ICD-10-CM

## 2024-05-14 DIAGNOSIS — Z2821 Immunization not carried out because of patient refusal: Secondary | ICD-10-CM | POA: Diagnosis not present

## 2024-05-14 DIAGNOSIS — Z5320 Procedure and treatment not carried out because of patient's decision for unspecified reasons: Secondary | ICD-10-CM

## 2024-05-14 NOTE — Patient Instructions (Signed)
 Jeremy Meadows , Thank you for taking time out of your busy schedule to complete your Annual Wellness Visit with me. I enjoyed our conversation and look forward to speaking with you again next year. I, as well as your care team,  appreciate your ongoing commitment to your health goals. Please review the following plan we discussed and let me know if I can assist you in the future. Your Game plan/ To Do List    Referrals: If you haven't heard from the office you've been referred to, please reach out to them at the phone provided.   Follow up Visits: We will see or speak with you next year for your Next Medicare AWV with our clinical staff Have you seen your provider in the last 6 months (3 months if uncontrolled diabetes)? Yes  Clinician Recommendations:  Aim for 30 minutes of exercise or brisk walking, 6-8 glasses of water, and 5 servings of fruits and vegetables each day.       This is a list of the screenings recommended for you:  Health Maintenance  Topic Date Due   DTaP/Tdap/Td vaccine (2 - Td or Tdap) 12/01/2021   Colon Cancer Screening  03/08/2022   COVID-19 Vaccine (8 - 2024-25 season) 09/06/2023   Medicare Annual Wellness Visit  05/08/2024   Flu Shot  04/27/2024   Pneumococcal Vaccine for age over 25  Completed   Hepatitis C Screening  Completed   Zoster (Shingles) Vaccine  Completed   HPV Vaccine  Aged Out   Meningitis B Vaccine  Aged Out   Pneumococcal Vaccine  Discontinued    Advanced directives: (In Chart) A copy of your advanced directives are scanned into your chart should your provider ever need it. Advance Care Planning is important because it:  [x]  Makes sure you receive the medical care that is consistent with your values, goals, and preferences  [x]  It provides guidance to your family and loved ones and reduces their decisional burden about whether or not they are making the right decisions based on your wishes.  Follow the link provided in your after visit  summary or read over the paperwork we have mailed to you to help you started getting your Advance Directives in place. If you need assistance in completing these, please reach out to us  so that we can help you!  See attachments for Preventive Care and Fall Prevention Tips.

## 2024-05-14 NOTE — Progress Notes (Signed)
 Because this visit was a virtual/telehealth visit,  certain criteria was not obtained, such a blood pressure, CBG if applicable, and timed get up and go. Any medications not marked as taking were not mentioned during the medication reconciliation part of the visit. Any vitals not documented were not able to be obtained due to this being a telehealth visit or patient was unable to self-report a recent blood pressure reading due to a lack of equipment at home via telehealth. Vitals that have been documented are verbally provided by the patient.  This visit was performed by a medical professional under my direct supervision. I was immediately available for consultation/collaboration. I have reviewed and agree with the Annual Wellness Visit documentation.  Subjective:   Jeremy Meadows is a 78 y.o. who presents for a Medicare Wellness preventive visit.  As a reminder, Annual Wellness Visits don't include a physical exam, and some assessments may be limited, especially if this visit is performed virtually. We may recommend an in-person follow-up visit with your provider if needed.  Visit Complete: Virtual I connected with  Jeremy Meadows on 05/14/24 by a audio enabled telemedicine application and verified that I am speaking with the correct person using two identifiers.  Patient Location: Home  Provider Location: Home Office  I discussed the limitations of evaluation and management by telemedicine. The patient expressed understanding and agreed to proceed.  Vital Signs: Because this visit was a virtual/telehealth visit, some criteria may be missing or patient reported. Any vitals not documented were not able to be obtained and vitals that have been documented are patient reported.  VideoDeclined- This patient declined Librarian, academic. Therefore the visit was completed with audio only.  Persons Participating in Visit: Patient.  AWV Questionnaire: Yes: Patient  Medicare AWV questionnaire was completed by the patient on 05/10/2024; I have confirmed that all information answered by patient is correct and no changes since this date.  Cardiac Risk Factors include: advanced age (>19men, >65 women);male gender;obesity (BMI >30kg/m2);hypertension     Objective:    Today's Vitals   05/14/24 0822  BP: 138/86  Weight: 225 lb (102.1 kg)  Height: 5' 10.75 (1.797 m)   Body mass index is 31.6 kg/m.     05/14/2024    8:27 AM 05/09/2023    8:07 AM 03/16/2022   10:30 AM 08/15/2019   11:16 AM 09/15/2018    8:16 AM 07/24/2018   11:21 AM 03/08/2017    9:13 AM  Advanced Directives  Does Patient Have a Medical Advance Directive? Yes Yes Yes Yes Yes  Yes  Yes   Type of Estate agent of [place];Living will Healthcare Power of [place];Living will Healthcare Power of [place];Living will Healthcare Power of [place];Living will  Healthcare Power of [place];Living will Living will;Healthcare Power of Attorney  Does patient want to make changes to medical advance directive? No - Patient declined No - Patient declined       Copy of Healthcare Power of Attorney in Chart? Yes - validated most recent copy scanned in chart (See row information) Yes - validated most recent copy scanned in chart (See row information) Yes - validated most recent copy scanned in chart (See row information) Yes - validated most recent copy scanned in chart (See row information)  No - copy requested       Data saved with a previous flowsheet row definition    Current Medications (verified) Outpatient Encounter Medications as of 05/14/2024  Medication Sig   albuterol  (  VENTOLIN  HFA) 108 (90 Base) MCG/ACT inhaler Inhale 2 puffs into the lungs every 6 (six) hours as needed for wheezing or shortness of breath.   amLODipine  (NORVASC ) 2.5 MG tablet TAKE 1 TABLET EVERY DAY   aspirin  81 MG tablet Take 81 mg by mouth daily.   atorvastatin  (LIPITOR) 10 MG tablet TAKE 1 TABLET  EVERY DAY AT 6PM   cholecalciferol (VITAMIN D) 1000 UNITS tablet Take 1,000 Units by mouth daily.   dorzolamide-timolol (COSOPT) 22.3-6.8 MG/ML ophthalmic solution 1 drop 2 (two) times daily.   metoprolol  tartrate (LOPRESSOR ) 50 MG tablet TAKE 1 TABLET TWICE DAILY   vitamin C (ASCORBIC ACID) 500 MG tablet Take 500 mg by mouth daily.    loratadine  (CLARITIN ) 10 MG tablet Take 1 tablet (10 mg total) by mouth daily as needed for allergies.   Facility-Administered Encounter Medications as of 05/14/2024  Medication   0.9 %  sodium chloride  infusion    Allergies (verified) Ace inhibitors   History: Past Medical History:  Diagnosis Date   Allergic rhinitis, cause unspecified    Aortic insufficiency    s/p AVR 2015   Aortic root enlargement (HCC)    thoracic aorta   Aortic valve stenosis 2015   s/p AVR 2015 Bobby) - completed cardiac rehab 08/2014   Arthritis    Asthma    Atrial fibrillation (HCC) 07/23/2014   Benign neoplasm of colon    Gallstones    by CT   History of kidney stones    History of nephrolithiasis    HLD (hyperlipidemia)    Hypertension    Need for prophylactic antibiotic    Obstructive sleep apnea (adult) (pediatric)    pt. doesn't use his machine at home.   Other testicular hypofunction    Pulmonary nodule, left 2015   2cm, ?sarcoid by biopsy   Sleep apnea    Unspecified glaucoma(365.9)    Past Surgical History:  Procedure Laterality Date   AORTIC VALVE REPLACEMENT N/A 07/02/2014   Procedure: AORTIC VALVE REPLACEMENT (AVR) with 23 Aortic Magna Ease;  Surgeon: Dallas KATHEE Jude, MD   ASCENDING AORTIC ROOT REPLACEMENT N/A 07/02/2014   Procedure: supra coronary  ASCENDING AORTIC REPLACEMENT to the Inominate Artery with 32mm Hemashield Platinum with  circulatory arrest;  Surgeon: Dallas KATHEE Jude, MD   CARDIAC CATHETERIZATION     CARDIAC VALVE REPLACEMENT  2015   CATARACT EXTRACTION Bilateral    COLONOSCOPY  2013   WNL, rpt 5 yrs Oma)   COLONOSCOPY   2018   1 TA, diverticulosis, rpt 5 yrs Oma)   EYE SURGERY     HERNIA REPAIR  2016   Result of aortic valve replacement   INTRAOPERATIVE TRANSESOPHAGEAL ECHOCARDIOGRAM N/A 07/02/2014   JOINT REPLACEMENT Right 2021 and 2014   partial replacement   KNEE SURGERY Right 05/2012   arthroscopic   KNEE SURGERY Right 12/2013   patella   LEFT AND RIGHT HEART CATHETERIZATION WITH CORONARY ANGIOGRAM N/A 05/22/2014   no significant obstructive CAD ([place])   LITHOTRIPSY     LUNG BIOPSY Left 2015   by IR - benign   MEDIAL PARTIAL KNEE REPLACEMENT Right 03/2013   REVERSE TOTAL SHOULDER ARTHROPLASTY Right 03/2022   Dr Tanda Gaba   RHINOPLASTY  1980s   VENTRAL HERNIA REPAIR  08/26/2015   VENTRAL HERNIA REPAIR N/A 08/26/2015   Procedure: VENTRAL HERNIA REPAIR WITH MESH ADULT;  Surgeon: Dallas KATHEE Jude, MD;  [facility];  Service: Vascular;  Laterality: N/A;   Family History  Problem Relation Age of Onset   Cancer Mother        Jaw   CAD Father 42       MI   Early death Father    Heart disease Brother    Heart disease Brother    Heart disease Brother    Colon cancer Neg Hx    Colon polyps Neg Hx    Esophageal cancer Neg Hx    Rectal cancer Neg Hx    Stomach cancer Neg Hx    Social History   Socioeconomic History   Marital status: Married    Spouse name: Not on file   Number of children: 2   Years of education: Not on file   Highest education level: Bachelor's degree (e.g., BA, AB, BS)  Occupational History   Occupation: retired  Tobacco Use   Smoking status: Never   Smokeless tobacco: Never  Vaping Use   Vaping status: Never Used  Substance and Sexual Activity   Alcohol use: No   Drug use: No   Sexual activity: Not Currently  Other Topics Concern   Not on file  Social History Narrative   Lives with [name] in [employer] - no agent orange exposure   Occupation: retired, was in Music therapist.   Activity: softball, walking   Diet: good water,  fruits/vegetables daily   Social Drivers of Health   Financial Resource Strain: Low Risk  (05/10/2024)   Overall Financial Resource Strain (CARDIA)    Difficulty of Paying Living Expenses: Not hard at all  Food Insecurity: No Food Insecurity (05/10/2024)   Hunger Vital Sign    Worried About Running Out of Food in the Last Year: Never true    Ran Out of Food in the Last Year: Never true  Transportation Needs: No Transportation Needs (05/10/2024)   PRAPARE - Administrator, Civil Service (Medical): No    Lack of Transportation (Non-Medical): No  Physical Activity: Insufficiently Active (05/10/2024)   Exercise Vital Sign    Days of Exercise per Week: 5 days    Minutes of Exercise per Session: 20 min  Stress: No Stress Concern Present (05/10/2024)   [employer] of Occupational Health - Occupational Stress Questionnaire    Feeling of Stress: Not at all  Social Connections: Moderately Isolated (05/10/2024)   Social Connection and Isolation Panel    Frequency of Communication with Friends and Family: Twice a week    Frequency of Social Gatherings with Friends and Family: Once a week    Attends Religious Services: Never    Database administrator or Organizations: No    Attends Engineer, structural: Never    Marital Status: Married    Tobacco Counseling Counseling given: Not Answered    Clinical Intake:  Pre-visit preparation completed: Yes  Pain : No/denies pain     BMI - recorded: 31.6 Nutritional Status: BMI > 30  Obese Nutritional Risks: None Diabetes: No  Lab Results  Component Value Date   HGBA1C 6.0 10/25/2023   HGBA1C 5.8 03/02/2021   HGBA1C 5.6 08/15/2019     How often do you need to have someone help you when you read instructions, pamphlets, or other written materials from your doctor or pharmacy?: 1 - Never What is the last grade level you completed in school?: college grad  Interpreter Needed?: No  Information entered by :: Jeremy Meadows,CMA   Activities of Daily Living     05/10/2024  11:41 AM  In your present state of health, do you have any difficulty performing the following activities:  Hearing? 0  Vision? 0  Difficulty concentrating or making decisions? 0  Walking or climbing stairs? 0  Dressing or bathing? 0  Doing errands, shopping? 0  Preparing Food and eating ? N  Using the Toilet? N  In the past six months, have you accidently leaked urine? N  Do you have problems with loss of bowel control? N  Managing your Medications? N  Managing your Finances? N  Housekeeping or managing your Housekeeping? N    Patient Care Team: Rilla Baller, MD as PCP - General (Family Medicine) [place], Peter M, MD as PCP - Cardiology (Cardiology) [place], Peter M, MD as Consulting Physician (Cardiology) [name] [facility] NOVAK, MD (Inactive) as Consulting Physician (Cardiothoracic Surgery) Fate Morna SAILOR, [facility] (Inactive) as Pharmacist (Pharmacist) Glendia Simmonds, OD as Referring Physician (Optometry)  I have updated your Care Teams any recent Medical Services you may have received from other providers in the past year.     Assessment:   This is a routine wellness examination for Jeremy Meadows.  Hearing/Vision screen Hearing Screening - Comments:: Patient wears hearing aids  Vision Screening - Comments:: Patient wears readers    Goals Addressed             This Visit's Progress    Patient Stated   On track    03/16/2022, wants to lose weight and wants to start playing softball again next year       Depression Screen     05/14/2024    8:27 AM 05/09/2023    8:11 AM 03/16/2022   10:31 AM 03/09/2021    2:07 PM 08/15/2019   11:17 AM 07/24/2018   11:20 AM 08/12/2016   11:44 AM  PHQ 2/9 Scores  PHQ - 2 Score 0 0 0 0 0 0 0  PHQ- 9 Score 0    0 0     Fall Risk     05/10/2024   11:41 AM 05/09/2023    8:09 AM 05/06/2023   11:08 AM 04/15/2023    4:59 PM 03/16/2022   10:30 AM  Fall Risk   Falls in the past  year? 0 0 0 0 0  Number falls in past yr: 0 0   0  Injury with Fall? 0 0   0  Risk for fall due to : No Fall Risks No Fall Risks   Medication side effect  Follow up Falls evaluation completed Falls prevention discussed   Falls evaluation completed;Education provided;Falls prevention discussed      Data saved with a previous flowsheet row definition    MEDICARE RISK AT HOME:  Medicare Risk at Home Any stairs in or around the home?: (Patient-Rptd) Yes If so, are there any without handrails?: (Patient-Rptd) Yes Home free of loose throw rugs in walkways, pet beds, electrical cords, etc?: (Patient-Rptd) Yes Adequate lighting in your home to reduce risk of falls?: (Patient-Rptd) Yes Life alert?: (Patient-Rptd) No Use of a cane, walker or w/c?: (Patient-Rptd) No Grab bars in the bathroom?: (Patient-Rptd) Yes Shower chair or bench in shower?: (Patient-Rptd) No Elevated toilet seat or a handicapped toilet?: (Patient-Rptd) Yes  TIMED UP AND GO:  Was the test performed?  No  Cognitive Function: 6CIT completed    08/15/2019   11:18 AM 07/24/2018   11:21 AM 08/12/2016   12:15 PM  MMSE - Mini Mental State Exam  Orientation to time 5 5 5  Orientation to Place 5 5 5    Registration 3 3 3    Attention/ Calculation 5 0 0   Recall 3 3 3    Language- name 2 objects  0 0   Language- repeat 1 1 1   Language- follow 3 step command  3 3   Language- read & follow direction  0 0   Write a sentence  0 0   Copy design  0 0   Total score  20 20      Data saved with a previous flowsheet row definition        05/14/2024    8:24 AM 05/09/2023    8:09 AM 03/16/2022   10:31 AM  6CIT Screen  What Year? 0 points 0 points 0 points  What month? 0 points 0 points 0 points  What time? 0 points 0 points 0 points  Count back from 20 0 points 0 points 0 points  Months in reverse 0 points 0 points 0 points  Repeat phrase 0 points 0 points 0 points  Total Score 0 points 0 points 0 points     Immunizations Immunization History  Administered Date(s) Administered    sv, Bivalent, Protein Subunit Rsvpref,pf (Abrysvo) 11/05/2023   Fluad Quad(high Dose 65+) 05/29/2019, 07/02/2020, 07/07/2021, 07/12/2023   Influenza Split 09/07/2011, 07/04/2012   Influenza Whole 06/27/2010   Influenza, High Dose Seasonal PF 06/18/2022   Influenza,inj,Quad PF,6+ Mos 07/03/2013, 06/18/2014, 07/10/2015, 07/28/2016, 07/01/2017, 06/22/2018   Influenza-Unspecified 07/03/2013, 06/18/2014, 07/10/2015, 07/28/2016, 07/01/2017, 06/22/2018, 07/12/2023   PFIZER Comirnaty(Gray Top)Covid-19 Tri-Sucrose Vaccine 02/10/2021, 07/12/2023   PFIZER(Purple Top)SARS-COV-2 Vaccination 11/09/2019, 12/05/2019, 08/01/2020   Pfizer Covid-19 Vaccine Bivalent Booster 77yrs & up 07/07/2021   Pfizer(Comirnaty)Fall Seasonal Vaccine 12 years and older 06/18/2022   Pneumococcal Conjugate-13 08/05/2014   Pneumococcal Polysaccharide-23 06/27/2010, 12/02/2011   Tdap 12/02/2011   Zoster Recombinant(Shingrix ) 10/05/2021, 01/19/2022   Zoster, Live 09/12/2014    Screening Tests Health Maintenance  Topic Date Due   DTaP/Tdap/Td (2 - Td or Tdap) 12/01/2021   Colonoscopy  03/08/2022   COVID-19 Vaccine (8 - 2024-25 season) 09/06/2023   INFLUENZA VACCINE  04/27/2024   Medicare Annual Wellness (AWV)  05/14/2025   Pneumococcal Vaccine: 50+ Years  Completed   Hepatitis C Screening  Completed   Zoster Vaccines- Shingrix   Completed   HPV VACCINES  Aged Out   Meningococcal B Vaccine  Aged Out   Pneumococcal Vaccine  Discontinued    Health Maintenance  Health Maintenance Due  Topic Date Due   DTaP/Tdap/Td (2 - Td or Tdap) 12/01/2021   Colonoscopy  03/08/2022   COVID-19 Vaccine (8 - 2024-25 season) 09/06/2023   INFLUENZA VACCINE  04/27/2024   Health Maintenance Items Addressed:patient declined   Additional Screening:  Vision Screening: Recommended annual ophthalmology exams for early detection of glaucoma and other disorders  of the eye. Would you like a referral to an eye doctor? No    Dental Screening: Recommended annual dental exams for proper oral hygiene  Community Resource Referral / Chronic Care Management: CRR required this visit?  No   CCM required this visit?  No   Plan:    I have personally reviewed and noted the following in the patient's chart:   Medical and social history Use of alcohol, tobacco or illicit drugs  Current medications and supplements including opioid prescriptions. Patient is not currently taking opioid prescriptions. Functional ability and status Nutritional status Physical activity Advanced directives List of other physicians Hospitalizations, surgeries, and ER visits in previous 12 months  Vitals Screenings to include cognitive, depression, and falls Referrals and appointments  In addition, I have reviewed and discussed with patient certain preventive protocols, quality metrics, and best practice recommendations. A written personalized care plan for preventive services as well as general preventive health recommendations were provided to patient.   Jeremy Meadows Right, [place]   05/14/2024   After Visit Summary: (MyChart) Due to this being a telephonic visit, the after visit summary with patients personalized plan was offered to patient via MyChart   Notes: Nothing significant to report at this time.

## 2024-05-16 DIAGNOSIS — L821 Other seborrheic keratosis: Secondary | ICD-10-CM | POA: Diagnosis not present

## 2024-05-16 DIAGNOSIS — L538 Other specified erythematous conditions: Secondary | ICD-10-CM | POA: Diagnosis not present

## 2024-05-16 DIAGNOSIS — L820 Inflamed seborrheic keratosis: Secondary | ICD-10-CM | POA: Diagnosis not present

## 2024-05-16 DIAGNOSIS — B078 Other viral warts: Secondary | ICD-10-CM | POA: Diagnosis not present

## 2024-05-16 DIAGNOSIS — L80 Vitiligo: Secondary | ICD-10-CM | POA: Diagnosis not present

## 2024-05-16 DIAGNOSIS — D225 Melanocytic nevi of trunk: Secondary | ICD-10-CM | POA: Diagnosis not present

## 2024-05-16 DIAGNOSIS — R238 Other skin changes: Secondary | ICD-10-CM | POA: Diagnosis not present

## 2024-05-16 DIAGNOSIS — L814 Other melanin hyperpigmentation: Secondary | ICD-10-CM | POA: Diagnosis not present

## 2024-05-23 ENCOUNTER — Other Ambulatory Visit: Payer: Self-pay | Admitting: Cardiology

## 2024-05-23 DIAGNOSIS — I10 Essential (primary) hypertension: Secondary | ICD-10-CM

## 2024-06-11 DIAGNOSIS — H401131 Primary open-angle glaucoma, bilateral, mild stage: Secondary | ICD-10-CM | POA: Diagnosis not present

## 2024-07-05 ENCOUNTER — Encounter: Payer: Self-pay | Admitting: Family Medicine

## 2024-07-28 ENCOUNTER — Encounter: Payer: Self-pay | Admitting: Family Medicine

## 2024-08-17 ENCOUNTER — Other Ambulatory Visit: Payer: Self-pay

## 2024-08-17 ENCOUNTER — Emergency Department
Admission: EM | Admit: 2024-08-17 | Discharge: 2024-08-17 | Disposition: A | Discharge status: Home / Self Care | Attending: Emergency Medicine | Admitting: Emergency Medicine

## 2024-08-17 ENCOUNTER — Emergency Department

## 2024-08-17 DIAGNOSIS — Z79899 Other long term (current) drug therapy: Secondary | ICD-10-CM | POA: Diagnosis not present

## 2024-08-17 DIAGNOSIS — K5730 Diverticulosis of large intestine without perforation or abscess without bleeding: Secondary | ICD-10-CM | POA: Diagnosis not present

## 2024-08-17 DIAGNOSIS — R109 Unspecified abdominal pain: Secondary | ICD-10-CM | POA: Diagnosis not present

## 2024-08-17 DIAGNOSIS — Z955 Presence of coronary angioplasty implant and graft: Secondary | ICD-10-CM | POA: Diagnosis not present

## 2024-08-17 DIAGNOSIS — I10 Essential (primary) hypertension: Secondary | ICD-10-CM | POA: Diagnosis not present

## 2024-08-17 DIAGNOSIS — K8020 Calculus of gallbladder without cholecystitis without obstruction: Secondary | ICD-10-CM | POA: Diagnosis not present

## 2024-08-17 DIAGNOSIS — N132 Hydronephrosis with renal and ureteral calculous obstruction: Secondary | ICD-10-CM | POA: Diagnosis not present

## 2024-08-17 DIAGNOSIS — K5790 Diverticulosis of intestine, part unspecified, without perforation or abscess without bleeding: Secondary | ICD-10-CM

## 2024-08-17 DIAGNOSIS — Z85038 Personal history of other malignant neoplasm of large intestine: Secondary | ICD-10-CM | POA: Insufficient documentation

## 2024-08-17 DIAGNOSIS — N200 Calculus of kidney: Secondary | ICD-10-CM | POA: Diagnosis not present

## 2024-08-17 DIAGNOSIS — Z7982 Long term (current) use of aspirin: Secondary | ICD-10-CM | POA: Diagnosis not present

## 2024-08-17 LAB — URINALYSIS, ROUTINE W REFLEX MICROSCOPIC
Bilirubin Urine: NEGATIVE
Glucose, UA: NEGATIVE mg/dL
Ketones, ur: NEGATIVE mg/dL
Leukocytes,Ua: NEGATIVE
Nitrite: NEGATIVE
Protein, ur: 100 mg/dL — AB
RBC / HPF: >50 RBC/hpf (ref 0–5)
Specific Gravity, Urine: 1.03 (ref 1.005–1.030)
pH: 5 (ref 5.0–8.0)

## 2024-08-17 LAB — BASIC METABOLIC PANEL WITH GFR
Anion gap: 9 (ref 5–15)
BUN: 21 mg/dL (ref 8–23)
CO2: 26 mmol/L (ref 22–32)
Calcium: 9.4 mg/dL (ref 8.9–10.3)
Chloride: 108 mmol/L (ref 98–111)
Creatinine, Ser: 1.2 mg/dL (ref 0.61–1.24)
GFR, Estimated: >60 mL/min (ref >60)
Glucose, Bld: 125 mg/dL — ABNORMAL HIGH (ref 70–99)
Potassium: 4.4 mmol/L (ref 3.5–5.1)
Sodium: 143 mmol/L (ref 135–145)

## 2024-08-17 LAB — CBC WITH DIFFERENTIAL/PLATELET
Abs Immature Granulocytes: 0.09 K/uL — ABNORMAL HIGH (ref 0.00–0.07)
Basophils Absolute: 0.1 K/uL (ref 0.0–0.1)
Basophils Relative: 1 %
Eosinophils Absolute: 0.2 K/uL (ref 0.0–0.5)
Eosinophils Relative: 2 %
HCT: 44 % (ref 39.0–52.0)
Hemoglobin: 14.7 g/dL (ref 13.0–17.0)
Immature Granulocytes: 1 %
Lymphocytes Relative: 16 %
Lymphs Abs: 1.6 K/uL (ref 0.7–4.0)
MCH: 31.9 pg (ref 26.0–34.0)
MCHC: 33.4 g/dL (ref 30.0–36.0)
MCV: 95.4 fL (ref 80.0–100.0)
Monocytes Absolute: 0.9 K/uL (ref 0.1–1.0)
Monocytes Relative: 9 %
Neutro Abs: 7.2 K/uL (ref 1.7–7.7)
Neutrophils Relative %: 71 %
Platelets: 166 K/uL (ref 150–400)
RBC: 4.61 MIL/uL (ref 4.22–5.81)
RDW: 12.9 % (ref 11.5–15.5)
WBC: 10 K/uL (ref 4.0–10.5)
nRBC: 0 % (ref 0.0–0.2)

## 2024-08-17 LAB — HEPATIC FUNCTION PANEL
ALT: 40 U/L (ref 0–44)
AST: 45 U/L — ABNORMAL HIGH (ref 15–41)
Albumin: 4.3 g/dL (ref 3.5–5.0)
Alkaline Phosphatase: 83 U/L (ref 38–126)
Bilirubin, Direct: 0.2 mg/dL (ref 0.0–0.2)
Indirect Bilirubin: 0.3 mg/dL (ref 0.3–0.9)
Total Bilirubin: 0.5 mg/dL (ref 0.0–1.2)
Total Protein: 7.2 g/dL (ref 6.5–8.1)

## 2024-08-17 LAB — LIPASE, BLOOD: Lipase: 25 U/L (ref 11–51)

## 2024-08-17 MED ORDER — ONDANSETRON HCL 4 MG/2ML IJ SOLN
4.0000 mg | Freq: Once | INTRAMUSCULAR | Status: AC
  Administered 2024-08-17: 4 mg via INTRAVENOUS
  Filled 2024-08-17: qty 2

## 2024-08-17 MED ORDER — MORPHINE SULFATE (PF) 4 MG/ML IV SOLN
4.0000 mg | Freq: Once | INTRAVENOUS | Status: AC
  Administered 2024-08-17: 4 mg via INTRAVENOUS
  Filled 2024-08-17: qty 1

## 2024-08-17 MED ORDER — TAMSULOSIN HCL 0.4 MG PO CAPS: 0.4000 mg | ORAL_CAPSULE | Freq: Every day | ORAL | 0 refills | Status: DC

## 2024-08-17 MED ORDER — DOCUSATE SODIUM 100 MG PO CAPS: 100.0000 mg | ORAL_CAPSULE | Freq: Two times a day (BID) | ORAL | 0 refills | Status: AC

## 2024-08-17 MED ORDER — OXYCODONE HCL 5 MG PO TABS: 5.0000 mg | ORAL_TABLET | Freq: Three times a day (TID) | ORAL | 0 refills | Status: DC | PRN

## 2024-08-17 MED ORDER — KETOROLAC TROMETHAMINE 30 MG/ML IJ SOLN
15.0000 mg | Freq: Once | INTRAMUSCULAR | Status: AC
  Administered 2024-08-17: 15 mg via INTRAVENOUS
  Filled 2024-08-17: qty 1

## 2024-08-17 MED ORDER — SODIUM CHLORIDE 0.9 % IV BOLUS (SEPSIS)
500.0000 mL | Freq: Once | INTRAVENOUS | Status: AC
  Administered 2024-08-17: 500 mL via INTRAVENOUS

## 2024-08-17 MED ORDER — ONDANSETRON 4 MG PO TBDP: 4.0000 mg | ORAL_TABLET | Freq: Four times a day (QID) | ORAL | 0 refills | Status: DC | PRN

## 2024-08-17 NOTE — Discharge Instructions (Addendum)
 You are being provided a prescription for opiates (also known as narcotics) for pain control.  Opiates can be addictive and should only be used when absolutely necessary for pain control when other alternatives do not work.  We recommend you only use them for the recommended amount of time and only as prescribed.  Please do not take with other sedative medications or alcohol.  Please do not drive, operate machinery, make important decisions while taking opiates.  Please note that these medications can be addictive and have high abuse potential.  Patients can become addicted to narcotics after only taking them for a few days.  Please keep these medications locked away from children, teenagers or any family members with history of substance abuse.  Narcotic pain medicine may also make you constipated.  You may use over-the-counter medications such as MiraLAX, Colace to prevent constipation.  If you become constipated, you may use over-the-counter enemas as needed.  Itching and nausea are also common side effects of narcotic pain medication.  If you develop uncontrolled vomiting or a rash, please stop these medications and seek medical care.   Your CT scan shows a 3 x 5 mm left-sided kidney stone.  This should pass on its own.  I recommend increasing your fluid intake and taking pain and nausea medicine as prescribed and following up with urology.  Your CT also incidentally showed gallstones and diverticulosis.  Gallstones can cause right upper abdominal pain that radiates into your back that can be worse after eating a fatty meal.

## 2024-08-17 NOTE — ED Triage Notes (Addendum)
 Pt reports left side flank pain that began earlier tonight, pt also noted his urine to be dark in color. Pt denies dysuria. Pt has hx kidney stones

## 2024-08-17 NOTE — ED Provider Notes (Signed)
 [facility] Provider Note    Event Date/Time   First MD Initiated Contact with Patient 08/17/24 0038     (approximate)   History   Flank Pain   HPI  Jeremy Meadows is a 78 y.o. male with history of kidney stones, aortic valve replacement secondary to aortic stenosis, aortic aneurysm status post grafting, hypertension, hyperlipidemia, atrial fibrillation, kidney stones who presents emergency department left flank pain, nausea and vomiting.  States urine appeared golden but has not had any dysuria or gross hematuria.  Symptoms feel similar to his prior kidney stones.  No fever.  No injury to his back.  Able to ambulate.  History provided by patient.    Past Medical History:  Diagnosis Date   Allergic rhinitis, cause unspecified    Aortic insufficiency    s/p AVR 2015   Aortic root enlargement    thoracic aorta   Aortic valve stenosis 2015   s/p AVR 2015 Bobby) - completed cardiac rehab 08/2014   Arthritis    Asthma    Atrial fibrillation (HCC) 07/23/2014   Benign neoplasm of colon    Gallstones    by CT   History of kidney stones    History of nephrolithiasis    HLD (hyperlipidemia)    Hypertension    Need for prophylactic antibiotic    Obstructive sleep apnea (adult) (pediatric)    pt. doesn't use his machine at home.   Other testicular hypofunction    Pulmonary nodule, left 2015   2cm, ?sarcoid by biopsy   Sleep apnea    Unspecified glaucoma(365.9)     Past Surgical History:  Procedure Laterality Date   AORTIC VALVE REPLACEMENT N/A 07/02/2014   Procedure: AORTIC VALVE REPLACEMENT (AVR) with 23 Aortic Magna Ease;  Surgeon: Dallas KATHEE Jude, MD   ASCENDING AORTIC ROOT REPLACEMENT N/A 07/02/2014   Procedure: supra coronary  ASCENDING AORTIC REPLACEMENT to the Inominate Artery with 32mm Hemashield Platinum with  circulatory arrest;  Surgeon: Dallas KATHEE Jude, MD   CARDIAC CATHETERIZATION     CARDIAC VALVE REPLACEMENT  2015    CATARACT EXTRACTION Bilateral    COLONOSCOPY  2013   WNL, rpt 5 yrs Oma)   COLONOSCOPY  2018   1 TA, diverticulosis, rpt 5 yrs Oma)   EYE SURGERY     HERNIA REPAIR  2016   Result of aortic valve replacement   INTRAOPERATIVE TRANSESOPHAGEAL ECHOCARDIOGRAM N/A 07/02/2014   JOINT REPLACEMENT Right 2021 and 2014   partial replacement   KNEE SURGERY Right 05/2012   arthroscopic   KNEE SURGERY Right 12/2013   patella   LEFT AND RIGHT HEART CATHETERIZATION WITH CORONARY ANGIOGRAM N/A 05/22/2014   no significant obstructive CAD ([place])   LITHOTRIPSY     LUNG BIOPSY Left 2015   by IR - benign   MEDIAL PARTIAL KNEE REPLACEMENT Right 03/2013   REVERSE TOTAL SHOULDER ARTHROPLASTY Right 03/2022   Dr Tanda Gaba   RHINOPLASTY  1980s   VENTRAL HERNIA REPAIR  08/26/2015   VENTRAL HERNIA REPAIR N/A 08/26/2015   Procedure: VENTRAL HERNIA REPAIR WITH MESH ADULT;  Surgeon: Dallas KATHEE Jude, MD;  Location: [facility] OR;  Service: Vascular;  Laterality: N/A;    MEDICATIONS:  Prior to Admission medications   Medication Sig Start Date End Date Taking? Authorizing Provider  albuterol  (VENTOLIN  HFA) 108 (90 Base) MCG/ACT inhaler Inhale 2 puffs into the lungs every 6 (six) hours as needed for wheezing or shortness of breath. 11/01/23   Rilla,  Anton, MD  amLODipine  (NORVASC ) 2.5 MG tablet TAKE 1 TABLET EVERY DAY 05/23/24   Jordan, Peter M, MD  aspirin  81 MG tablet Take 81 mg by mouth daily.    [provider]  atorvastatin  (LIPITOR) 10 MG tablet TAKE 1 TABLET EVERY DAY AT 6PM 05/23/24   Jordan, Peter M, MD  cholecalciferol (VITAMIN D) 1000 UNITS tablet Take 1,000 Units by mouth daily.    [provider]  dorzolamide-timolol (COSOPT) 22.3-6.8 MG/ML ophthalmic solution 1 drop 2 (two) times daily.    [provider]  loratadine  (CLARITIN ) 10 MG tablet Take 1 tablet (10 mg total) by mouth daily as needed for allergies. 11/01/23   Rilla Anton, MD  metoprolol  tartrate  (LOPRESSOR ) 50 MG tablet TAKE 1 TABLET TWICE DAILY 05/23/24   Jordan, Peter M, MD  vitamin C (ASCORBIC ACID) 500 MG tablet Take 500 mg by mouth daily.     [provider]    Physical Exam   Triage Vital Signs: ED Triage Vitals  Encounter Vitals Group     BP 08/17/24 0035 (!) 161/98     Girls Systolic BP Percentile --      Girls Diastolic BP Percentile --      Boys Systolic BP Percentile --      Boys Diastolic BP Percentile --      Pulse Rate 08/17/24 0035 79     Resp 08/17/24 0035 17     Temp 08/17/24 0035 98.2 F (36.8 C)     Temp src --      SpO2 08/17/24 0035 100 %     Weight 08/17/24 0035 225 lb (102.1 kg)     Height 08/17/24 0035 5' 11 (1.803 m)     Head Circumference --      Peak Flow --      Pain Score 08/17/24 0034 3     Pain Loc --      Pain Education --      Exclude from Growth Chart --     Most recent vital signs: Vitals:   08/17/24 0035  BP: (!) 161/98  Pulse: 79  Resp: 17  Temp: 98.2 F (36.8 C)  SpO2: 100%    CONSTITUTIONAL: Alert, responds appropriately to questions. Well-appearing; well-nourished HEAD: Normocephalic, atraumatic EYES: Conjunctivae clear, pupils appear equal, sclera nonicteric ENT: normal nose; moist mucous membranes NECK: Supple, normal ROM CARD: RRR; S1 and S2 appreciated RESP: Normal chest excursion without splinting or tachypnea; breath sounds clear and equal bilaterally; no wheezes, no rhonchi, no rales, no hypoxia or respiratory distress, speaking full sentences ABD/GI: Non-distended; soft, non-tender, no rebound, no guarding, no peritoneal signs BACK: The back appears normal, no CVA tenderness, no midline spinal tenderness or step-off or deformity EXT: Normal ROM in all joints; no deformity noted, no edema SKIN: Normal color for age and race; warm; no rash on exposed skin NEURO: Moves all extremities equally, normal speech, normal gait PSYCH: The patient's mood and manner are appropriate.   ED Results / Procedures  / Treatments   LABS: (all labs ordered are listed, but only abnormal results are displayed) Labs Reviewed  URINALYSIS, ROUTINE W REFLEX MICROSCOPIC - Abnormal; Notable for the following components:      Result Value   Color, Urine AMBER (*)    APPearance CLOUDY (*)    Hgb urine dipstick LARGE (*)    Protein, ur 100 (*)    Bacteria, UA MANY (*)    All other components within normal limits  CBC  WITH DIFFERENTIAL/PLATELET - Abnormal; Notable for the following components:   Abs Immature Granulocytes 0.09 (*)    All other components within normal limits  BASIC METABOLIC PANEL WITH GFR - Abnormal; Notable for the following components:   Glucose, Bld 125 (*)    All other components within normal limits  HEPATIC FUNCTION PANEL - Abnormal; Notable for the following components:   AST 45 (*)    All other components within normal limits  URINE CULTURE  LIPASE, BLOOD     EKG:  EKG Interpretation Date/Time:    Ventricular Rate:    PR Interval:    QRS Duration:    QT Interval:    QTC Calculation:   R Axis:      Text Interpretation:           RADIOLOGY: My personal review and interpretation of imaging: CT scan shows kidney stone.  I have personally reviewed all radiology reports.   CT Renal Stone Study Result Date: 08/17/2024 EXAM: CT ABDOMEN AND PELVIS WITHOUT CONTRAST 08/17/2024 12:58:53 AM TECHNIQUE: CT of the abdomen and pelvis was performed without the administration of intravenous contrast. Multiplanar reformatted images are provided for review. Automated exposure control, iterative reconstruction, and/or weight-based adjustment of the mA/kV was utilized to reduce the radiation dose to as low as reasonably achievable. COMPARISON: None available. CLINICAL HISTORY: Abdominal/flank pain, stone suspected. FINDINGS: LOWER CHEST: No acute abnormality. LIVER: The liver is unremarkable. GALLBLADDER AND BILE DUCTS: Cholelithiasis without superimposed pericholecystic inflammatory  change. No intra or extrahepatic biliary ductal dilation. SPLEEN: No acute abnormality. PANCREAS: No acute abnormality. ADRENAL GLANDS: No acute abnormality. KIDNEYS, URETERS AND BLADDER: Left kidney: 3 x 5 mm obstructing calculus is seen within the mid left ureter at the level of L5 resulting in mild left hydronephrosis and moderate asymmetric left perinephric stranding. No perinephric fluid collections. Right kidney: No hydronephrosis on the right. General: No additional intrarenal or ureteral calculi. The bladder is decompressed and is unremarkable. GI AND BOWEL: Mild sigmoid diverticulosis. The stomach, small bowel, and large bowel are otherwise unremarkable. Appendix normal. There is no bowel obstruction. PERITONEUM AND RETROPERITONEUM: No ascites. No free air. VASCULATURE: Mild aortoiliac atherosclerotic calcification. No aortic aneurysm. Aorta is normal in caliber. LYMPH NODES: No lymphadenopathy. REPRODUCTIVE ORGANS: No acute abnormality. BONES AND SOFT TISSUES: Osseous structures are age appropriate. No acute bone abnormality. No lytic or blastic bone lesion. No focal soft tissue abnormality. IMPRESSION: 1. 3 x 5 mm obstructing calculus in the mid left ureter at the level of L5, resulting in mild left hydronephrosis and moderate asymmetric left perinephric stranding. No perinephric fluid collections. No additional intrarenal or ureteral calculi. 2. Cholelithiasis without superimposed pericholecystic inflammatory change. No intra or extrahepatic biliary ductal dilation. 3. Mild sigmoid diverticulosis without evidence of diverticulitis. Electronically signed by: Dorethia Molt MD 08/17/2024 01:08 AM EST RP Workstation: [id number]     PROCEDURES:  Critical Care performed: No      Procedures    IMPRESSION / MDM / ASSESSMENT AND PLAN / ED COURSE  I reviewed the triage vital signs and the nursing notes.    Patient here with complaints of flank pain.  Feels similar to his prior kidney  stones.     DIFFERENTIAL DIAGNOSIS (includes but not limited to):   Kidney stone, UTI, pyelonephritis, musculoskeletal back pain, doubt cauda equina, epidural abscess or hematoma, discitis or osteomyelitis, fracture, appendicitis, colitis, bowel obstruction   Patient's presentation is most consistent with acute presentation with potential threat to life or bodily function.  PLAN: Will obtain labs, urine, CT of the abdomen pelvis.  Patient states he drove himself to the emergency department would like to drive home.  He states that Toradol  has helped him in the past.  Will give Toradol , Zofran , IV fluids here.   MEDICATIONS GIVEN IN ED: Medications  sodium chloride  0.9 % bolus 500 mL (0 mLs Intravenous Stopped 08/17/24 0149)  ketorolac  (TORADOL ) 30 MG/ML injection 15 mg (15 mg Intravenous Given 08/17/24 0115)  ondansetron  (ZOFRAN ) injection 4 mg (4 mg Intravenous Given 08/17/24 0116)  morphine  (PF) 4 MG/ML injection 4 mg (4 mg Intravenous Given 08/17/24 0227)     ED COURSE: Labs show no leukocytosis, normal creatinine, LFTs and lipase.  CT scan reviewed and interpreted by myself and the radiologist and shows a 3 x 5 mm obstructing stone in the left mid ureter with mild left hydronephrosis.  Patient reports no significant improvement after Toradol .  Will give IV morphine .   3:40 AM  Pt's urine does show red blood cells and white blood cells and many bacteria but no nitrites.  He has no fever or leukocytosis and no complaints of dysuria.  Low suspicion clinically for superimposed infection but will send urine culture.  He reports his pain is now 2/10.  Will discharge in the morning with prescriptions for pain and nausea medicine, Flomax  and have given urology follow-up information.   At this time, I do not feel there is any life-threatening condition present. I reviewed all nursing notes, vitals, pertinent previous records.  All lab and urine results, EKGs, imaging ordered have been  independently reviewed and interpreted by myself.  I reviewed all available radiology reports from any imaging ordered this visit.  Based on my assessment, I feel the patient is safe to be discharged home without further emergent workup and can continue workup as an outpatient as needed. Discussed all findings, treatment plan as well as usual and customary return precautions.  They verbalize understanding and are comfortable with this plan.  Outpatient follow-up has been provided as needed.  All questions have been answered.   CONSULTS:  none   OUTSIDE RECORDS REVIEWED: Reviewed most recent cardiology and family medicine notes.       FINAL CLINICAL IMPRESSION(S) / ED DIAGNOSES   Final diagnoses:  Kidney stone  Gallstones  Diverticulosis     Rx / DC Orders   ED Discharge Orders          Ordered    oxyCODONE  (ROXICODONE ) 5 MG immediate release tablet  Every 8 hours PRN        08/17/24 0339    ondansetron  (ZOFRAN -ODT) 4 MG disintegrating tablet  Every 6 hours PRN        08/17/24 0339    tamsulosin  (FLOMAX ) 0.4 MG CAPS capsule  Daily        08/17/24 0339    docusate sodium  (COLACE) 100 MG capsule  2 times daily        08/17/24 9660             Note:  This document was prepared using Dragon voice recognition software and may include unintentional dictation errors.   Allyce Bochicchio, Josette SAILOR, DO 08/17/24 [phone]

## 2024-08-18 LAB — URINE CULTURE: Culture: NO GROWTH

## 2024-09-10 ENCOUNTER — Encounter: Payer: Self-pay | Admitting: Cardiology

## 2024-10-03 ENCOUNTER — Encounter: Payer: Self-pay | Admitting: Family Medicine

## 2024-10-05 ENCOUNTER — Ambulatory Visit
Admission: RE | Admit: 2024-10-05 | Discharge: 2024-10-05 | Disposition: A | Discharge status: Home / Self Care | Attending: Emergency Medicine

## 2024-10-05 VITALS — BP 146/87 | HR 72 | Temp 97.9°F | Resp 18

## 2024-10-05 DIAGNOSIS — J069 Acute upper respiratory infection, unspecified: Secondary | ICD-10-CM | POA: Diagnosis not present

## 2024-10-05 DIAGNOSIS — J4521 Mild intermittent asthma with (acute) exacerbation: Secondary | ICD-10-CM

## 2024-10-05 MED ORDER — AMOXICILLIN-POT CLAVULANATE 875-125 MG PO TABS: 1.0000 | ORAL_TABLET | Freq: Two times a day (BID) | ORAL | 0 refills | Status: DC

## 2024-10-05 NOTE — ED Triage Notes (Signed)
 Patient to Urgent Care with complaints of wheezing/ crackling in his chest/ green and brown mucus production. Sinus headache.   Symptoms x3 days. Has seen some improvement in his symptoms.   Using tylenol .

## 2024-10-05 NOTE — ED Provider Notes (Signed)
 " Jeremy Meadows    CSN: [id number] Arrival date & time: 10/05/24  1147      History   Chief Complaint Chief Complaint  Patient presents with   Wheezing    crackling/ wheezing, green phlegm - Entered by patient    HPI Jeremy Meadows is a 79 y.o. male.  Patient presents with 4-day history of congestion and cough productive of yellow-green mucus.  3 days ago he noted some crackling and wheezing but these have improved.  No fever or shortness of breath.  He has been treating his symptoms with Tylenol .  His medical history includes asthma, pulmonary nodule, sarcoidosis, obstructive sleep apnea, hypertension, atrial fibrillation, aortic valve stenosis.   The history is provided by the patient and medical records.    Past Medical History:  Diagnosis Date   Allergic rhinitis, cause unspecified    Aortic insufficiency    s/p AVR 2015   Aortic root enlargement    thoracic aorta   Aortic valve stenosis 2015   s/p AVR 2015 Bobby) - completed cardiac rehab 08/2014   Arthritis    Asthma    Atrial fibrillation (HCC) 07/23/2014   Benign neoplasm of colon    Gallstones    by CT   History of kidney stones    History of nephrolithiasis    HLD (hyperlipidemia)    Hypertension    Need for prophylactic antibiotic    Obstructive sleep apnea (adult) (pediatric)    pt. doesn't use his machine at home.   Other testicular hypofunction    Pulmonary nodule, left 2015   2cm, ?sarcoid by biopsy   Sleep apnea    Unspecified glaucoma(365.9)     Patient Active Problem List   Diagnosis Date Noted   Cyst of joint of right hand 11/01/2023   Insomnia 03/09/2021   Traumatic rotator cuff tear, left, initial encounter 06/27/2020   Traumatic rotator cuff tear, right, sequela 12/19/2018   History of colonic polyps 02/18/2017   Thrombosed external hemorrhoid 02/18/2017   CAD (coronary artery disease) 10/17/2016   Thoracic aortic atherosclerosis 10/17/2016   Health maintenance  examination 08/12/2015   Advanced care planning/counseling discussion 08/05/2014   Medicare annual wellness visit, subsequent 08/05/2014   Prediabetes 08/05/2014   Lone atrial fibrillation (HCC) 07/23/2014   S/P AVR (aortic valve replacement) and aortoplasty 07/23/2014   Thoracic ascending aortic aneurysm 07/02/2014   Sarcoidosis 04/26/2014   Asthmatic bronchitis 04/24/2014   Pulmonary nodule, left    Gross hematuria 11/06/2013   Essential hypertension 08/29/2013   Aortic valve disorder    DJD (degenerative joint disease) 02/27/2013   Wedge compression fracture of thoracic vertebra (HCC) 02/27/2013   TESTICULAR HYPOFUNCTION 09/08/2010   Recurrent nephrolithiasis 08/23/2009   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   Glaucoma 08/22/2008   HYPERCHOLESTEROLEMIA 12/18/2007   Obstructive sleep apnea 12/18/2007   ALLERGIC RHINITIS 12/18/2007    Past Surgical History:  Procedure Laterality Date   AORTIC VALVE REPLACEMENT N/A 07/02/2014   Procedure: AORTIC VALVE REPLACEMENT (AVR) with 23 Aortic Magna Ease;  Surgeon: Dallas KATHEE Jude, MD   ASCENDING AORTIC ROOT REPLACEMENT N/A 07/02/2014   Procedure: supra coronary  ASCENDING AORTIC REPLACEMENT to the Inominate Artery with 32mm Hemashield Platinum with  circulatory arrest;  Surgeon: Dallas KATHEE Jude, MD   CARDIAC CATHETERIZATION     CARDIAC VALVE REPLACEMENT  2015   CATARACT EXTRACTION Bilateral    COLONOSCOPY  2013   WNL, rpt 5 yrs Oma)   COLONOSCOPY  2018   1 TA, diverticulosis,  rpt 5 yrs Oma)   EYE SURGERY     HERNIA REPAIR  2016   Result of aortic valve replacement   INTRAOPERATIVE TRANSESOPHAGEAL ECHOCARDIOGRAM N/A 07/02/2014   JOINT REPLACEMENT Right 2021 and 2014   partial replacement   KNEE SURGERY Right 05/2012   arthroscopic   KNEE SURGERY Right 12/2013   patella   LEFT AND RIGHT HEART CATHETERIZATION WITH CORONARY ANGIOGRAM N/A 05/22/2014   no significant obstructive CAD ([place])   LITHOTRIPSY     LUNG BIOPSY Left 2015   by  IR - benign   MEDIAL PARTIAL KNEE REPLACEMENT Right 03/2013   REVERSE TOTAL SHOULDER ARTHROPLASTY Right 03/2022   Dr Tanda Gaba   RHINOPLASTY  1980s   VENTRAL HERNIA REPAIR  08/26/2015   VENTRAL HERNIA REPAIR N/A 08/26/2015   Procedure: VENTRAL HERNIA REPAIR WITH MESH ADULT;  Surgeon: Dallas KATHEE Jude, MD;  Location: [facility] OR;  Service: Vascular;  Laterality: N/A;       Home Medications    Prior to Admission medications  Medication Sig Start Date End Date Taking? Authorizing Provider  albuterol  (VENTOLIN  HFA) 108 (90 Base) MCG/ACT inhaler Inhale 2 puffs into the lungs every 6 (six) hours as needed for wheezing or shortness of breath. 11/01/23   Rilla Baller, MD  amLODipine  (NORVASC ) 2.5 MG tablet TAKE 1 TABLET EVERY DAY 05/23/24   Jordan, Peter M, MD  amoxicillin -clavulanate (AUGMENTIN ) 875-125 MG tablet Take 1 tablet by mouth every 12 (twelve) hours. 10/05/24  Yes Corlis Burnard DEL, NP  aspirin  81 MG tablet Take 81 mg by mouth daily.    [provider]  atorvastatin  (LIPITOR) 10 MG tablet TAKE 1 TABLET EVERY DAY AT 6PM 05/23/24   Jordan, Peter M, MD  cholecalciferol (VITAMIN D) 1000 UNITS tablet Take 1,000 Units by mouth daily.    [provider]  dorzolamide-timolol (COSOPT) 22.3-6.8 MG/ML ophthalmic solution 1 drop 2 (two) times daily.    [provider]  loratadine  (CLARITIN ) 10 MG tablet Take 1 tablet (10 mg total) by mouth daily as needed for allergies. 11/01/23   Rilla Baller, MD  metoprolol  tartrate (LOPRESSOR ) 50 MG tablet TAKE 1 TABLET TWICE DAILY 05/23/24   Jordan, Peter M, MD  ondansetron  (ZOFRAN -ODT) 4 MG disintegrating tablet Take 1 tablet (4 mg total) by mouth every 6 (six) hours as needed for nausea or vomiting. 08/17/24   Ward, Josette SAILOR, DO  oxyCODONE  (ROXICODONE ) 5 MG immediate release tablet Take 1 tablet (5 mg total) by mouth every 8 (eight) hours as needed. 08/17/24 08/17/25  Ward, Josette SAILOR, DO  tamsulosin  (FLOMAX ) 0.4 MG CAPS capsule  Take 1 capsule (0.4 mg total) by mouth daily. Take until stone passes 08/17/24   Ward, Josette N, DO  vitamin C (ASCORBIC ACID) 500 MG tablet Take 500 mg by mouth daily.     [provider]    Family History Family History  Problem Relation Age of Onset   Cancer Mother        Jaw   CAD Father 49       MI   Early death Father    Heart disease Brother    Heart disease Brother    Heart disease Brother    Colon cancer Neg Hx    Colon polyps Neg Hx    Esophageal cancer Neg Hx    Rectal cancer Neg Hx    Stomach cancer Neg Hx     Social History Social History[1]   Allergies   Ace inhibitors  Review of Systems Review of Systems  Constitutional:  Negative for chills and fever.  HENT:  Positive for congestion. Negative for ear pain and sore throat.   Respiratory:  Positive for cough and wheezing. Negative for shortness of breath.      Physical Exam Triage Vital Signs ED Triage Vitals  Encounter Vitals Group     BP 10/05/24 1212 (!) 146/87     Girls Systolic BP Percentile --      Girls Diastolic BP Percentile --      Boys Systolic BP Percentile --      Boys Diastolic BP Percentile --      Pulse Rate 10/05/24 1212 72     Resp 10/05/24 1212 18     Temp 10/05/24 1212 97.9 F (36.6 C)     Temp src --      SpO2 10/05/24 1212 95 %     Weight --      Height --      Head Circumference --      Peak Flow --      Pain Score 10/05/24 1217 0     Pain Loc --      Pain Education --      Exclude from Growth Chart --    No data found.  Updated Vital Signs BP (!) 146/87   Pulse 72   Temp 97.9 F (36.6 C)   Resp 18   SpO2 95%   Visual Acuity Right Eye Distance:   Left Eye Distance:   Bilateral Distance:    Right Eye Near:   Left Eye Near:    Bilateral Near:     Physical Exam Constitutional:      General: He is not in acute distress. HENT:     Right Ear: Tympanic membrane normal.     Left Ear: Tympanic membrane normal.     Nose: Congestion present.      Mouth/Throat:     Mouth: Mucous membranes are moist.     Pharynx: Oropharynx is clear.  Cardiovascular:     Rate and Rhythm: Normal rate and regular rhythm.     Heart sounds: Normal heart sounds.  Pulmonary:     Effort: Pulmonary effort is normal. No respiratory distress.     Breath sounds: Normal breath sounds.  Neurological:     Mental Status: He is alert.      UC Treatments / Results  Labs (all labs ordered are listed, but only abnormal results are displayed) Labs Reviewed - No data to display  EKG   Radiology No results found.  Procedures Procedures (including critical care time)  Medications Ordered in UC Medications - No data to display  Initial Impression / Assessment and Plan / UC Course  I have reviewed the triage vital signs and the nursing notes.  Pertinent labs & imaging results that were available during my care of the patient were reviewed by me and considered in my medical decision making (see chart for details).    Acute upper respiratory infection, asthma exacerbation.  Afebrile and vital signs are stable.  Lungs are clear at this time and O2 sat is 95% on room air.  Patient has history of asthma and sarcoidosis.  He has an albuterol  inhaler for use as needed.  He declines prednisone  today.  Treating today with Augmentin .  Education provided on upper respiratory infection and asthma.  Instructed him to follow-up with his PCP.  ED precautions given.  He agrees to plan of care.  Final Clinical Impressions(s) / UC Diagnoses   Final diagnoses:  Acute upper respiratory infection  Mild intermittent asthma with acute exacerbation     Discharge Instructions      Take the Augmentin  as directed.  Use your albuterol  inhaler as directed.    Follow up with your primary care provider.  Go to the emergency department if you have worsening symptoms.        ED Prescriptions     Medication Sig Dispense Auth. Provider   amoxicillin -clavulanate (AUGMENTIN )  875-125 MG tablet Take 1 tablet by mouth every 12 (twelve) hours. 14 tablet Corlis Burnard DEL, NP      PDMP not reviewed this encounter.    [1]  Social History Tobacco Use   Smoking status: Never   Smokeless tobacco: Never  Vaping Use   Vaping status: Never Used  Substance Use Topics   Alcohol use: No   Drug use: No     Corlis Burnard DEL, NP 10/05/24 1235  "

## 2024-10-05 NOTE — Discharge Instructions (Addendum)
 Take the Augmentin  as directed.  Use your albuterol  inhaler as directed.    Follow up with your primary care provider.  Go to the emergency department if you have worsening symptoms.

## 2024-10-11 NOTE — Progress Notes (Signed)
 "  Jeremy Meadows Date of Birth: 06-25-1946 Medical Record #[id number]  History of Present Illness: Jeremy Meadows is seen for followup of aortic stenosis and  thoracic aortic aneurysm. He is s/p AVR and aortic aneurysm grafting by Dr. [name] on 07/02/14 with a #23 Magna Ease pericardial valve and 32 mm Hema shield graft. His post op course was complicated by atrial fibrillation that converted to NSR on amiodarone . Amiodarone  was later discontinued and he has had no further arrhythmia.   In Nov 2016 he had an incisional hernia repaired by Dr [name], he tolerated this well. CT of the aorta in December 2017 was stable. Repeat Echo 05/31/19 showed normal LV function and normal AV prosthetic function.  He did have a recent URI treated with antibiotics. Otherwise he is doing well. 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 Denies any chest pain. No palpitations.    Current Outpatient Medications on File Prior to Visit  Medication Sig Dispense Refill   albuterol  (VENTOLIN  HFA) 108 (90 Base) MCG/ACT inhaler Inhale 2 puffs into the lungs every 6 (six) hours as needed for wheezing or shortness of breath. 17 each 2   amLODipine  (NORVASC ) 2.5 MG tablet TAKE 1 TABLET EVERY DAY 90 tablet 1   aspirin  81 MG tablet Take 81 mg by mouth daily.     atorvastatin  (LIPITOR) 10 MG tablet TAKE 1 TABLET EVERY DAY AT 6PM 90 tablet 1   cholecalciferol (VITAMIN D) 1000 UNITS tablet Take 1,000 Units by mouth daily.     dorzolamide-timolol (COSOPT) 22.3-6.8 MG/ML ophthalmic solution 1 drop 2 (two) times daily.     metoprolol  tartrate (LOPRESSOR ) 50 MG tablet TAKE 1 TABLET TWICE DAILY 180 tablet 1   vitamin C (ASCORBIC ACID) 500 MG tablet Take 500 mg by mouth daily.      Current Facility-Administered Medications on File Prior to Visit  Medication Dose Route Frequency Provider Last Rate Last Admin   0.9 %  sodium chloride  infusion  500 mL Intravenous Continuous Aneita Gwendlyn DASEN, MD         Allergies  Allergen Reactions   Ace Inhibitors Cough    Hacking cough    Past Medical History:  Diagnosis Date   Allergic rhinitis, cause unspecified    Aortic insufficiency    s/p AVR 2015   Aortic root enlargement    thoracic aorta   Aortic valve stenosis 2015   s/p AVR 2015 Bobby) - completed cardiac rehab 08/2014   Arthritis    Asthma    Atrial fibrillation (HCC) 07/23/2014   Benign neoplasm of colon    Gallstones    by CT   History of kidney stones    History of nephrolithiasis    HLD (hyperlipidemia)    Hypertension    Need for prophylactic antibiotic    Obstructive sleep apnea (adult) (pediatric)    pt. doesn't use his machine at home.   Other testicular hypofunction    Pulmonary nodule, left 2015   2cm, ?sarcoid by biopsy   Sleep apnea    Unspecified glaucoma(365.9)     Past Surgical History:  Procedure Laterality Date   AORTIC VALVE REPLACEMENT N/A 07/02/2014   Procedure: AORTIC VALVE REPLACEMENT (AVR) with 23 Aortic Magna Ease;  Surgeon: Dallas KATHEE [name], MD   ASCENDING AORTIC ROOT REPLACEMENT N/A 07/02/2014   Procedure: supra coronary  ASCENDING AORTIC REPLACEMENT to the Inominate Artery with 32mm Hemashield Platinum with  circulatory arrest;  Surgeon: Dallas KATHEE [name], MD  CARDIAC CATHETERIZATION     CARDIAC VALVE REPLACEMENT  2015   CATARACT EXTRACTION Bilateral    COLONOSCOPY  2013   WNL, rpt 5 yrs Oma)   COLONOSCOPY  2018   1 TA, diverticulosis, rpt 5 yrs Oma)   EYE SURGERY     HERNIA REPAIR  2016   Result of aortic valve replacement   INTRAOPERATIVE TRANSESOPHAGEAL ECHOCARDIOGRAM N/A 07/02/2014   JOINT REPLACEMENT Right 2021 and 2014   partial replacement   KNEE SURGERY Right 05/2012   arthroscopic   KNEE SURGERY Right 12/2013   patella   LEFT AND RIGHT HEART CATHETERIZATION WITH CORONARY ANGIOGRAM N/A 05/22/2014   no significant obstructive CAD ([place])   LITHOTRIPSY     LUNG BIOPSY Left 2015   by IR - benign   MEDIAL  PARTIAL KNEE REPLACEMENT Right 03/2013   REVERSE TOTAL SHOULDER ARTHROPLASTY Right 03/2022   Dr Tanda Gaba   RHINOPLASTY  1980s   VENTRAL HERNIA REPAIR  08/26/2015   VENTRAL HERNIA REPAIR N/A 08/26/2015   Procedure: VENTRAL HERNIA REPAIR WITH MESH ADULT;  Surgeon: Dallas KATHEE Jude, MD;  Location: [facility] OR;  Service: Vascular;  Laterality: N/A;    Social History   Tobacco Use  Smoking Status Never  Smokeless Tobacco Never    Social History   Substance and Sexual Activity  Alcohol Use No    Family History  Problem Relation Age of Onset   Cancer Mother        Jaw   CAD Father 29       MI   Early death Father    Heart disease Brother    Heart disease Brother    Heart disease Brother    Colon cancer Neg Hx    Colon polyps Neg Hx    Esophageal cancer Neg Hx    Rectal cancer Neg Hx    Stomach cancer Neg Hx     Review of Systems: As noted in history of present illness. All other systems were reviewed and are negative.  Physical Exam: BP (!) 142/70   Pulse 89   Ht 5' 10 (1.778 m)   Wt 229 lb (103.9 kg)   SpO2 96%   BMI 32.86 kg/m  GENERAL:  Well appearing WM in NAD HEENT:  PERRL, EOMI, sclera are clear. Oropharynx is clear. NECK:  No jugular venous distention, carotid upstroke brisk and symmetric, no bruits, no thyromegaly or adenopathy LUNGS:  Clear to auscultation bilaterally CHEST:  Unremarkable HEART:  RRR,  PMI not displaced or sustained,S1 and S2 within normal limits, no S3, no S4: no clicks, no rubs, soft 1/6 systolic murmur RUSB ABD:  Soft, nontender. BS +, no masses or bruits. No hepatomegaly, no splenomegaly EXT:  2 + pulses throughout, no edema, no cyanosis no clubbing SKIN:  Warm and dry.  No rashes NEURO:  Alert and oriented x 3. Cranial nerves II through XII intact. PSYCH:  Cognitively intact    LABORATORY DATA: Lab Results  Component Value Date   WBC 10.0 08/17/2024   HGB 14.7 08/17/2024   HCT 44.0 08/17/2024   PLT 166 08/17/2024    GLUCOSE 125 (H) 08/17/2024   CHOL 110 10/25/2023   TRIG 201.0 (H) 10/25/2023   HDL 28.90 (L) 10/25/2023   LDLDIRECT 67.0 08/15/2019   LDLCALC 41 10/25/2023   ALT 40 08/17/2024   AST 45 (H) 08/17/2024   NA 143 08/17/2024   K 4.4 08/17/2024   CL 108 08/17/2024   CREATININE 1.20 08/17/2024  BUN 21 08/17/2024   CO2 26 08/17/2024   TSH 2.09 08/15/2019   PSA 0.88 03/02/2021   INR 0.99 08/14/2015   HGBA1C 6.0 10/25/2023   EKG Interpretation Date/Time:  Tuesday October 16 2024 08:39:38 EST Ventricular Rate:  89 PR Interval:  196 QRS Duration:  132 QT Interval:  406 QTC Calculation: 493 R Axis:   -31  Text Interpretation: Normal sinus rhythm Left axis deviation Right bundle branch block Minimal voltage criteria for LVH, may be normal variant ( R in aVL ) When compared with ECG of 12-Oct-2023 15:28, QRS axis Shifted left QT has lengthened less T wave inversion laterally Confirmed by Jigar Zielke [phone]) on 10/16/2024 8:45:03 AM   EKG Interpretation Date/Time:  Tuesday October 16 2024 08:39:38 EST Ventricular Rate:  89 PR Interval:  196 QRS Duration:  132 QT Interval:  406 QTC Calculation: 493 R Axis:   -31  Text Interpretation: Normal sinus rhythm Left axis deviation Right bundle branch block Minimal voltage criteria for LVH, may be normal variant ( R in aVL ) When compared with ECG of 12-Oct-2023 15:28, QRS axis Shifted left QT has lengthened less T wave inversion laterally Confirmed by Natale Thoma [phone]) on 10/16/2024 8:45:03 AM     Echo: 08/02/14: Study Conclusions  - Left ventricle: The cavity size was normal. Wall thickness was   normal. Systolic function was normal. The estimated ejection   fraction was in the range of 60% to 65%. Features are consistent   with a pseudonormal left ventricular filling pattern, with   concomitant abnormal relaxation and increased filling pressure   (grade 2 diastolic dysfunction). - Aortic valve: AV valve prosthesis opens well Peak and  mean   gradients through the valve are 17 and 10 mm Hg respectively. - Mitral valve: There was mild regurgitation. - Left atrium: The atrium was moderately dilated.   CT CHEST WITHOUT CONTRAST   TECHNIQUE: Multidetector CT imaging of the chest was performed following the standard protocol without IV contrast.   COMPARISON:  02/20/2015   FINDINGS: Cardiovascular: No acute findings. Previous aortic valve replacement. Stable appearance of the ascending aortic graft. Distal ascending aorta along the distal aspect of the graft measures 4.5 cm in maximum diameter, which remains stable. No evidence of mediastinal hematoma.   Aortic and coronary artery atherosclerotic calcification noted. Normal heart size. No evidence pericardial effusion.   Mediastinum/Nodes: No masses or pathologically enlarged lymph nodes identified on this unenhanced exam.   Lungs/Pleura: No pulmonary infiltrate or mass identified. Mild scarring noted posterior left lower lobe. No effusion present.   Upper Abdomen:  Small calcified gallstones incidentally noted.   Musculoskeletal:  No suspicious bone lesions.   IMPRESSION: Previous aortic valve replacement. Stable appearance of ascending aortic graft. Stable mild dilatation of distal ascending aorta along the distal aspect of the graft, measuring 4.5 cm in maximum diameter.   No acute findings.   Incidentally noted aortic and coronary atherosclerosis and cholelithiasis.     Electronically Signed   By: Norleen Kil M.D.   On: 09/15/2016 14:38  Echo 05/31/19: IMPRESSIONS      1. The left ventricle has normal systolic function, with an ejection fraction of 55-60%. The cavity size was normal. Left ventricular diastolic parameters were normal. Indeterminate filling pressures No evidence of left ventricular regional wall motion  abnormalities.  2. The right ventricle has normal systolic function. The cavity was normal. There is no increase in right  ventricular wall thickness.  3. The mitral valve is  abnormal. Mild thickening of the mitral valve leaflet.  4. The tricuspid valve is grossly normal.  5. A 23mm Magna bioprosthesis valve is present in the aortic position. Normal aortic valve prosthesis.  6. Prosthetic aortic graft in the ascending aorta position.  7. The aorta is normal unless otherwise noted.  8. The inferior vena cava was normal in size with <50% respiratory variability.  9. When compared to the prior study: 08/02/14 EF 60-65%. AV max, mean.  Echo 10/05/23: IMPRESSIONS     1. Left ventricular ejection fraction, by estimation, is 60 to 65%. The  left ventricle has normal function. The left ventricle has no regional  wall motion abnormalities. Left ventricular diastolic parameters are  consistent with Grade II diastolic  dysfunction (pseudonormalization). The average left ventricular global  longitudinal strain is -18.0 %. The global longitudinal strain is normal.   2. Right ventricular systolic function is normal. The right ventricular  size is normal. There is normal pulmonary artery systolic pressure.   3. Left atrial size was mild to moderately dilated.   4. Right atrial size was moderately dilated.   5. The mitral valve is normal in structure. Mild mitral valve  regurgitation. No evidence of mitral stenosis.   6. The aortic valve is normal in structure. Aortic valve regurgitation is  not visualized. No aortic stenosis is present. There is a 23 mm Magna  pericardial valve present in the aortic position. Procedure Date: 2015.  Echo findings are consistent with  normal structure and function of the aortic valve prosthesis.   7. The inferior vena cava is normal in size with greater than 50%  respiratory variability, suggesting right atrial pressure of 3 mmHg.    Assessment / Plan: 1. Aortic stenosis/bicuspid AV -  S/p pericardial tissue valve replacement in October 2015. Doing well.  Repeat Echo Jan 2025  showed normal prosthetic function. SBE prophylaxis. Exam today is normal.   2. Thoracic aortic aneurysm. S/p grafting. CT showed good repair with stable follow up in December 2017.   3. Hypertension-satisfactory control   4.PVCs asymptomatic. Noted intermittently in past. Normal LV function. Continue metoprolol .  5. RBBB   Follow up in one year  "

## 2024-10-16 ENCOUNTER — Ambulatory Visit: Admitting: Cardiology

## 2024-10-16 ENCOUNTER — Encounter: Payer: Self-pay | Admitting: Cardiology

## 2024-10-16 VITALS — BP 142/70 | HR 89 | Ht 70.0 in | Wt 229.0 lb

## 2024-10-16 DIAGNOSIS — I350 Nonrheumatic aortic (valve) stenosis: Secondary | ICD-10-CM | POA: Diagnosis not present

## 2024-10-16 DIAGNOSIS — I7121 Aneurysm of the ascending aorta, without rupture: Secondary | ICD-10-CM | POA: Diagnosis not present

## 2024-10-16 DIAGNOSIS — I10 Essential (primary) hypertension: Secondary | ICD-10-CM

## 2024-10-16 DIAGNOSIS — Z952 Presence of prosthetic heart valve: Secondary | ICD-10-CM

## 2024-10-16 NOTE — Patient Instructions (Signed)
 Medication Instructions:  Continue all medications *If you need a refill on your cardiac medications before your next appointment, please call your pharmacy*  Lab Work: None ordered  Testing/Procedures: None ordered  Follow-Up: At [facility], you and your health needs are our priority.  As part of our continuing mission to provide you with exceptional heart care, our providers are all part of one team.  This team includes your primary Cardiologist (physician) and Advanced Practice Providers or APPs (Physician Assistants and Nurse Practitioners) who all work together to provide you with the care you need, when you need it.  Your next appointment:  1 year    Call in Oct to schedule Jan appointment     Provider:  Dr.Jordan   We recommend signing up for the patient portal called MyChart.  Sign up information is provided on this After Visit Summary.  MyChart is used to connect with patients for Virtual Visits (Telemedicine).  Patients are able to view lab/test results, encounter notes, upcoming appointments, etc.  Non-urgent messages can be sent to your provider as well.   To learn more about what you can do with MyChart, go to [email or web address].

## 2024-10-17 ENCOUNTER — Other Ambulatory Visit: Payer: Self-pay | Admitting: Cardiology

## 2024-10-17 DIAGNOSIS — I10 Essential (primary) hypertension: Secondary | ICD-10-CM

## 2024-10-22 ENCOUNTER — Encounter: Payer: Self-pay | Admitting: Family Medicine

## 2024-10-22 DIAGNOSIS — R197 Diarrhea, unspecified: Secondary | ICD-10-CM

## 2024-10-23 NOTE — Telephone Encounter (Signed)
 Copied from CRM #[id number]. Topic: General - Other >> Oct 23, 2024  1:39 PM Deleta RAMAN wrote: Reason for CRM: patient calling regarding recommendations message sent on mychart. He would like to know if there's anything recommended. Please respond via mychart or phone

## 2024-10-23 NOTE — Telephone Encounter (Signed)
 Spoke with pt. States that his diarrhea is getting worse and would like Dr. Talmadge recommendations. Pt has been taking immodium but read somewhere that he should not take that medication while being on the antibiotic. Last antibiotic was taken on 10/12/24 and he is still having issues with diarrhea. Advised pt that I would send this message along to Dr. KANDICE to address.

## 2024-10-24 ENCOUNTER — Other Ambulatory Visit

## 2024-10-24 ENCOUNTER — Ambulatory Visit: Admitting: Family Medicine

## 2024-10-24 ENCOUNTER — Encounter: Payer: Self-pay | Admitting: Family Medicine

## 2024-10-24 VITALS — BP 128/82 | HR 75 | Temp 98.0°F | Ht 70.0 in | Wt 226.1 lb

## 2024-10-24 DIAGNOSIS — Z952 Presence of prosthetic heart valve: Secondary | ICD-10-CM

## 2024-10-24 DIAGNOSIS — T3695XA Adverse effect of unspecified systemic antibiotic, initial encounter: Secondary | ICD-10-CM | POA: Diagnosis not present

## 2024-10-24 DIAGNOSIS — K521 Toxic gastroenteritis and colitis: Secondary | ICD-10-CM | POA: Diagnosis not present

## 2024-10-24 NOTE — Patient Instructions (Addendum)
 Pass by lab to pick up stool test - rule out C difficile infection.  Pick up some culturelle or activia probiotic for after recent antibiotics.  We will be in touch with results  Continue to drink plenty of fluids.

## 2024-10-24 NOTE — Telephone Encounter (Signed)
 Please schedule OV today for evaluation.  If unable to come in, at least have him come in for stool studies for abx associated diarrhea.

## 2024-10-24 NOTE — Assessment & Plan Note (Addendum)
 Continues SBE ppx, tolerates amox 2000mg  x1 well

## 2024-10-24 NOTE — Progress Notes (Addendum)
 " Ph: [phone] Fax: [phone]   Patient ID: Jeremy Meadows, male    DOB: 1946-05-07, 78 y.o.   MRN: [id number]  This visit was conducted in person.  BP 128/82   Pulse 75   Temp 98 F (36.7 C) (Oral)   Ht 5' 10 (1.778 m)   Wt 226 lb 2 oz (102.6 kg)   SpO2 98%   BMI 32.45 kg/m    CC: diarrhea  Subjective:   HPI: Jeremy Meadows is a 79 y.o. male presenting on 10/24/2024 for Diarrhea (Pt here for diarrhea . Pt went to UC for wheezing and coughing and was prescribe Amoxicillin . After taking meds he developed diarrhea. Pt states he has never had an issue before. Appetite is ok and is drinking drinks with electrolytes. )   [name]  has a past medical history of Allergic rhinitis, cause unspecified, Aortic insufficiency, Aortic root enlargement, Aortic valve stenosis (2015), Arthritis, Asthma, Atrial fibrillation (HCC) (07/23/2014), Benign neoplasm of colon, Gallstones, History of kidney stones, History of nephrolithiasis, HLD (hyperlipidemia), Hypertension, Need for prophylactic antibiotic, Obstructive sleep apnea (adult) (pediatric), Other testicular hypofunction, Pulmonary nodule, left (2015), Sleep apnea, and Unspecified glaucoma(365.9).   Seen at [facility] on 10/06/2023 for lung wheezing, congestion, cough. Treated with augmentin  course with subsequent development of mustard-colored watery diarrhea with mucous - this started ~10/12/2023. 3+ watery stools/day. More frequently in the morning. This morning had a more solid bowel movement, but then again returned to watery diarrhea.   No fevers/chills, blood in stool, no abd pain, or cramping, nausea/vomiting, urinary symptoms.   Treated at home with imodium.      Relevant past medical, surgical, family and social history reviewed and updated as indicated. Interim medical history since our last visit reviewed. Allergies and medications reviewed and updated. Outpatient Medications Prior to Visit  Medication Sig Dispense Refill    albuterol  (VENTOLIN  HFA) 108 (90 Base) MCG/ACT inhaler Inhale 2 puffs into the lungs every 6 (six) hours as needed for wheezing or shortness of breath. 17 each 2   amLODipine  (NORVASC ) 2.5 MG tablet TAKE 1 TABLET EVERY DAY 90 tablet 2   aspirin  81 MG tablet Take 81 mg by mouth daily.     atorvastatin  (LIPITOR) 10 MG tablet TAKE 1 TABLET EVERY DAY AT 6PM 90 tablet 2   cholecalciferol (VITAMIN D) 1000 UNITS tablet Take 1,000 Units by mouth daily.     dorzolamide-timolol (COSOPT) 22.3-6.8 MG/ML ophthalmic solution 1 drop 2 (two) times daily.     metoprolol  tartrate (LOPRESSOR ) 50 MG tablet TAKE 1 TABLET TWICE DAILY 180 tablet 2   vitamin C (ASCORBIC ACID) 500 MG tablet Take 500 mg by mouth daily.      Facility-Administered Medications Prior to Visit  Medication Dose Route Frequency Provider Last Rate Last Admin   0.9 %  sodium chloride  infusion  500 mL Intravenous Continuous Aneita Gwendlyn DASEN, MD         Per HPI unless specifically indicated in ROS section below Review of Systems  Objective:  BP 128/82   Pulse 75   Temp 98 F (36.7 C) (Oral)   Ht 5' 10 (1.778 m)   Wt 226 lb 2 oz (102.6 kg)   SpO2 98%   BMI 32.45 kg/m   Wt Readings from Last 3 Encounters:  10/24/24 226 lb 2 oz (102.6 kg)  10/16/24 229 lb (103.9 kg)  08/17/24 225 lb (102.1 kg)      Physical Exam Vitals and nursing note reviewed.  Constitutional:      Appearance: Normal appearance. He is not ill-appearing.  HENT:     Head: Normocephalic and atraumatic.     Mouth/Throat:     Pharynx: Oropharynx is clear. No oropharyngeal exudate or posterior oropharyngeal erythema.  Eyes:     Extraocular Movements: Extraocular movements intact.     Pupils: Pupils are equal, round, and reactive to light.  Cardiovascular:     Rate and Rhythm: Normal rate and regular rhythm.     Pulses: Normal pulses.     Heart sounds: Normal heart sounds. No murmur heard. Pulmonary:     Effort: Pulmonary effort is normal. No respiratory  distress.     Breath sounds: No wheezing, rhonchi or rales.  Abdominal:     General: Bowel sounds are increased. There is no distension.     Palpations: Abdomen is soft. There is no mass.     Tenderness: There is no abdominal tenderness. There is no guarding or rebound.     Hernia: No hernia is present.  Musculoskeletal:     Right lower leg: No edema.     Left lower leg: No edema.  Skin:    General: Skin is warm and dry.     Comments: Chronic vitiligo   Neurological:     Mental Status: He is alert.        Assessment & Plan:   Problem List Items Addressed This Visit     S/P AVR (aortic valve replacement) and aortoplasty   Continues SBE ppx, tolerates amox 2000mg  x1 well          Antibiotic-associated diarrhea - Primary   Acute diarrheal illness present over the past 12+ days after recent augmentin  course. Antibiotic- associated watery diarrhea, possible abx side effect will need to r/o C diff  or other infectious cause. Send home today with C diff and GI pathogen panel. Reviewed supportive measures. Rec probiotic such as culturelle or activia. Encouraged good hydration status. Further treatment pending stool study results. Pt agrees with plan. Normal vital signs - ok for outpatient management Orders:   Gastrointestinal Pathogen Pnl RT, PCR       Relevant Orders   Gastrointestinal Pathogen Pnl RT, PCR    Assessment & Plan Antibiotic-associated diarrhea Acute diarrheal illness present over the past 12+ days after recent augmentin  course. Antibiotic- associated watery diarrhea, possible abx side effect will need to r/o C diff  or other infectious cause. Send home today with C diff and GI pathogen panel.  Reviewed supportive measures. Rec probiotic such as culturelle or activia to replenish healthy gut flora. Encouraged good hydration status. Further treatment pending stool study results. Pt agrees with plan.  Normal vital signs - ok for outpatient management Orders:    Gastrointestinal Pathogen Pnl RT, PCR  S/P AVR (aortic valve replacement) and aortoplasty Continues SBE ppx, tolerates amox 2000mg  x1 well      No orders of the defined types were placed in this encounter.   Orders Placed This Encounter  Procedures   Gastrointestinal Pathogen Pnl RT, PCR    Patient Instructions  Pass by lab to pick up stool test - rule out C difficile infection.  Pick up some culturelle or activia probiotic for after recent antibiotics.  We will be in touch with results  Continue to drink plenty of fluids.   Follow up plan: No follow-ups on file.  Anton Blas, MD   "

## 2024-10-24 NOTE — Assessment & Plan Note (Addendum)
 Acute diarrheal illness present over the past 12+ days after recent augmentin  course. Antibiotic- associated watery diarrhea, possible abx side effect will need to r/o C diff  or other infectious cause. Send home today with C diff and GI pathogen panel.  Reviewed supportive measures. Rec probiotic such as culturelle or activia to replenish healthy gut flora. Encouraged good hydration status. Further treatment pending stool study results. Pt agrees with plan.  Normal vital signs - ok for outpatient management Orders:   Gastrointestinal Pathogen Pnl RT, PCR

## 2024-10-24 NOTE — Telephone Encounter (Signed)
 Mychart message sent asking pt to call to make an appt.

## 2024-10-25 ENCOUNTER — Telehealth: Payer: Self-pay | Admitting: Family Medicine

## 2024-10-25 ENCOUNTER — Other Ambulatory Visit: Payer: Self-pay | Admitting: Family Medicine

## 2024-10-25 DIAGNOSIS — R7303 Prediabetes: Secondary | ICD-10-CM

## 2024-10-25 DIAGNOSIS — I4891 Unspecified atrial fibrillation: Secondary | ICD-10-CM

## 2024-10-25 DIAGNOSIS — S22000D Wedge compression fracture of unspecified thoracic vertebra, subsequent encounter for fracture with routine healing: Secondary | ICD-10-CM

## 2024-10-25 DIAGNOSIS — E7800 Pure hypercholesterolemia, unspecified: Secondary | ICD-10-CM

## 2024-10-25 DIAGNOSIS — I10 Essential (primary) hypertension: Secondary | ICD-10-CM

## 2024-10-25 NOTE — Addendum Note (Signed)
 Addended by: HOPE VEVA PARAS on: 10/25/2024 07:23 AM   Modules accepted: Orders

## 2024-10-25 NOTE — Telephone Encounter (Signed)
 Called lvm for patient to call back. If possible if patient brings in stool sample today 10/25/24 to come in fasting at least 4 hours for his blood work to so we do not have to do it on Monday. If patient is not we will do bloodwork on Monday.

## 2024-10-29 ENCOUNTER — Other Ambulatory Visit: Payer: Medicare HMO

## 2024-10-29 ENCOUNTER — Other Ambulatory Visit

## 2024-11-01 ENCOUNTER — Other Ambulatory Visit

## 2024-11-01 ENCOUNTER — Ambulatory Visit: Payer: Self-pay | Admitting: Family Medicine

## 2024-11-01 DIAGNOSIS — S22000D Wedge compression fracture of unspecified thoracic vertebra, subsequent encounter for fracture with routine healing: Secondary | ICD-10-CM

## 2024-11-01 DIAGNOSIS — R7303 Prediabetes: Secondary | ICD-10-CM

## 2024-11-01 DIAGNOSIS — E7800 Pure hypercholesterolemia, unspecified: Secondary | ICD-10-CM

## 2024-11-01 DIAGNOSIS — I10 Essential (primary) hypertension: Secondary | ICD-10-CM

## 2024-11-01 DIAGNOSIS — I4891 Unspecified atrial fibrillation: Secondary | ICD-10-CM

## 2024-11-01 LAB — CBC WITH DIFFERENTIAL/PLATELET
Basophils Absolute: 0 10*3/uL (ref 0.0–0.1)
Basophils Relative: 0.6 % (ref 0.0–3.0)
Eosinophils Absolute: 0.2 10*3/uL (ref 0.0–0.7)
Eosinophils Relative: 3.5 % (ref 0.0–5.0)
HCT: 44 % (ref 39.0–52.0)
Hemoglobin: 14.8 g/dL (ref 13.0–17.0)
Lymphocytes Relative: 25.7 % (ref 12.0–46.0)
Lymphs Abs: 1.7 10*3/uL (ref 0.7–4.0)
MCHC: 33.7 g/dL (ref 30.0–36.0)
MCV: 94.5 fl (ref 78.0–100.0)
Monocytes Absolute: 0.7 10*3/uL (ref 0.1–1.0)
Monocytes Relative: 11.5 % (ref 3.0–12.0)
Neutro Abs: 3.8 10*3/uL (ref 1.4–7.7)
Neutrophils Relative %: 58.7 % (ref 43.0–77.0)
Platelets: 158 10*3/uL (ref 150.0–400.0)
RBC: 4.65 Mil/uL (ref 4.22–5.81)
RDW: 13.3 % (ref 11.5–15.5)
WBC: 6.5 10*3/uL (ref 4.0–10.5)

## 2024-11-01 LAB — COMPREHENSIVE METABOLIC PANEL WITH GFR
ALT: 37 U/L (ref 3–53)
AST: 35 U/L (ref 5–37)
Albumin: 4 g/dL (ref 3.5–5.2)
Alkaline Phosphatase: 74 U/L (ref 39–117)
BUN: 14 mg/dL (ref 6–23)
CO2: 30 meq/L (ref 19–32)
Calcium: 9 mg/dL (ref 8.4–10.5)
Chloride: 105 meq/L (ref 96–112)
Creatinine, Ser: 1.17 mg/dL (ref 0.40–1.50)
GFR: 59.5 mL/min — ABNORMAL LOW
Glucose, Bld: 115 mg/dL — ABNORMAL HIGH (ref 70–99)
Potassium: 4.1 meq/L (ref 3.5–5.1)
Sodium: 141 meq/L (ref 135–145)
Total Bilirubin: 0.7 mg/dL (ref 0.2–1.2)
Total Protein: 6.5 g/dL (ref 6.0–8.3)

## 2024-11-01 LAB — MICROALBUMIN / CREATININE URINE RATIO
Creatinine,U: 214.8 mg/dL
Microalb Creat Ratio: 7.7 mg/g (ref 0.0–30.0)
Microalb, Ur: 1.7 mg/dL (ref 0.7–1.9)

## 2024-11-01 LAB — LIPID PANEL
Cholesterol: 89 mg/dL (ref 28–200)
HDL: 27.4 mg/dL — ABNORMAL LOW
LDL Cholesterol: 25 mg/dL (ref 10–99)
NonHDL: 61.62
Total CHOL/HDL Ratio: 3
Triglycerides: 183 mg/dL — ABNORMAL HIGH (ref 10.0–149.0)
VLDL: 36.6 mg/dL (ref 0.0–40.0)

## 2024-11-01 LAB — HEMOGLOBIN A1C: Hgb A1c MFr Bld: 6.1 % (ref 4.6–6.5)

## 2024-11-01 LAB — VITAMIN D 25 HYDROXY (VIT D DEFICIENCY, FRACTURES): VITD: 34.98 ng/mL (ref 30.00–100.00)

## 2024-11-05 ENCOUNTER — Encounter: Payer: Medicare HMO | Admitting: Family Medicine

## 2025-05-15 ENCOUNTER — Ambulatory Visit
